# Patient Record
Sex: Female | Born: 1935 | Race: White | Hispanic: No | Marital: Married | Smoking: Never smoker
Health system: Southern US, Community
[De-identification: ages and names within clinical notes are randomized; demographics above are authoritative.]

## PROBLEM LIST (undated history)

## (undated) DIAGNOSIS — I5032 Chronic diastolic (congestive) heart failure: Secondary | ICD-10-CM

## (undated) DIAGNOSIS — M1990 Unspecified osteoarthritis, unspecified site: Secondary | ICD-10-CM

## (undated) DIAGNOSIS — I809 Phlebitis and thrombophlebitis of unspecified site: Secondary | ICD-10-CM

## (undated) DIAGNOSIS — I495 Sick sinus syndrome: Secondary | ICD-10-CM

## (undated) DIAGNOSIS — I10 Essential (primary) hypertension: Secondary | ICD-10-CM

## (undated) DIAGNOSIS — K5732 Diverticulitis of large intestine without perforation or abscess without bleeding: Secondary | ICD-10-CM

## (undated) DIAGNOSIS — I2510 Atherosclerotic heart disease of native coronary artery without angina pectoris: Secondary | ICD-10-CM

## (undated) DIAGNOSIS — G8929 Other chronic pain: Secondary | ICD-10-CM

## (undated) DIAGNOSIS — Z9289 Personal history of other medical treatment: Secondary | ICD-10-CM

## (undated) DIAGNOSIS — K819 Cholecystitis, unspecified: Secondary | ICD-10-CM

## (undated) DIAGNOSIS — I509 Heart failure, unspecified: Secondary | ICD-10-CM

## (undated) DIAGNOSIS — E039 Hypothyroidism, unspecified: Secondary | ICD-10-CM

## (undated) DIAGNOSIS — J90 Pleural effusion, not elsewhere classified: Secondary | ICD-10-CM

## (undated) DIAGNOSIS — R29898 Other symptoms and signs involving the musculoskeletal system: Secondary | ICD-10-CM

## (undated) DIAGNOSIS — R21 Rash and other nonspecific skin eruption: Secondary | ICD-10-CM

## (undated) DIAGNOSIS — E785 Hyperlipidemia, unspecified: Secondary | ICD-10-CM

## (undated) DIAGNOSIS — I4821 Permanent atrial fibrillation: Secondary | ICD-10-CM

## (undated) DIAGNOSIS — J301 Allergic rhinitis due to pollen: Secondary | ICD-10-CM

## (undated) DIAGNOSIS — I272 Pulmonary hypertension, unspecified: Secondary | ICD-10-CM

## (undated) DIAGNOSIS — K635 Polyp of colon: Secondary | ICD-10-CM

## (undated) DIAGNOSIS — J40 Bronchitis, not specified as acute or chronic: Secondary | ICD-10-CM

## (undated) DIAGNOSIS — R32 Unspecified urinary incontinence: Secondary | ICD-10-CM

## (undated) DIAGNOSIS — T7840XA Allergy, unspecified, initial encounter: Secondary | ICD-10-CM

## (undated) HISTORY — DX: Unspecified urinary incontinence: R32

## (undated) HISTORY — PX: SKIN GRAFT: SHX250

## (undated) HISTORY — DX: Atherosclerotic heart disease of native coronary artery without angina pectoris: I25.10

## (undated) HISTORY — DX: Phlebitis and thrombophlebitis of unspecified site: I80.9

## (undated) HISTORY — DX: Pleural effusion, not elsewhere classified: J90

## (undated) HISTORY — DX: Diverticulitis of large intestine without perforation or abscess without bleeding: K57.32

## (undated) HISTORY — DX: Permanent atrial fibrillation: I48.21

## (undated) HISTORY — DX: Hyperlipidemia, unspecified: E78.5

## (undated) HISTORY — DX: Personal history of other medical treatment: Z92.89

## (undated) HISTORY — DX: Other chronic pain: G89.29

## (undated) HISTORY — DX: Allergic rhinitis due to pollen: J30.1

## (undated) HISTORY — DX: Cholecystitis, unspecified: K81.9

## (undated) HISTORY — DX: Unspecified osteoarthritis, unspecified site: M19.90

## (undated) HISTORY — DX: Chronic diastolic (congestive) heart failure: I50.32

## (undated) HISTORY — DX: Other symptoms and signs involving the musculoskeletal system: R29.898

## (undated) HISTORY — DX: Pulmonary hypertension, unspecified: I27.20

## (undated) HISTORY — DX: Essential (primary) hypertension: I10

## (undated) HISTORY — DX: Rash and other nonspecific skin eruption: R21

## (undated) HISTORY — DX: Allergy, unspecified, initial encounter: T78.40XA

## (undated) HISTORY — PX: BREAST SURGERY: SHX581

## (undated) HISTORY — DX: Sick sinus syndrome: I49.5

## (undated) HISTORY — DX: Heart failure, unspecified: I50.9

## (undated) HISTORY — PX: TONSILLECTOMY: SUR1361

## (undated) HISTORY — DX: Hypothyroidism, unspecified: E03.9

## (undated) HISTORY — DX: Polyp of colon: K63.5

## (undated) HISTORY — DX: Bronchitis, not specified as acute or chronic: J40

---
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
## 1975-01-28 HISTORY — PX: ABDOMINAL HYSTERECTOMY: SHX81

## 1977-01-27 HISTORY — PX: ANKLE FRACTURE SURGERY: SHX122

## 1997-06-23 ENCOUNTER — Ambulatory Visit (HOSPITAL_COMMUNITY): Admission: RE | Admit: 1997-06-23 | Discharge: 1997-06-23 | Payer: Self-pay | Admitting: Endocrinology

## 1998-06-01 ENCOUNTER — Other Ambulatory Visit: Admission: RE | Admit: 1998-06-01 | Discharge: 1998-06-01 | Payer: Self-pay | Admitting: *Deleted

## 1998-06-23 ENCOUNTER — Emergency Department (HOSPITAL_COMMUNITY): Admission: EM | Admit: 1998-06-23 | Discharge: 1998-06-23 | Payer: Self-pay | Admitting: Emergency Medicine

## 1998-06-23 ENCOUNTER — Encounter: Payer: Self-pay | Admitting: Emergency Medicine

## 1998-07-06 ENCOUNTER — Ambulatory Visit (HOSPITAL_COMMUNITY): Admission: RE | Admit: 1998-07-06 | Discharge: 1998-07-06 | Payer: Self-pay | Admitting: Endocrinology

## 1998-07-06 ENCOUNTER — Encounter: Payer: Self-pay | Admitting: Endocrinology

## 1998-07-25 ENCOUNTER — Encounter: Payer: Self-pay | Admitting: Endocrinology

## 1998-07-25 ENCOUNTER — Ambulatory Visit (HOSPITAL_COMMUNITY): Admission: RE | Admit: 1998-07-25 | Discharge: 1998-07-25 | Payer: Self-pay | Admitting: Endocrinology

## 1999-05-15 ENCOUNTER — Encounter: Payer: Self-pay | Admitting: Endocrinology

## 1999-05-15 ENCOUNTER — Other Ambulatory Visit: Admission: RE | Admit: 1999-05-15 | Discharge: 1999-05-15 | Payer: Self-pay | Admitting: Endocrinology

## 1999-05-15 ENCOUNTER — Encounter: Admission: RE | Admit: 1999-05-15 | Discharge: 1999-05-15 | Payer: Self-pay | Admitting: Endocrinology

## 1999-05-15 ENCOUNTER — Encounter (INDEPENDENT_AMBULATORY_CARE_PROVIDER_SITE_OTHER): Payer: Self-pay | Admitting: *Deleted

## 1999-12-07 ENCOUNTER — Encounter: Payer: Self-pay | Admitting: Emergency Medicine

## 1999-12-07 ENCOUNTER — Emergency Department (HOSPITAL_COMMUNITY): Admission: EM | Admit: 1999-12-07 | Discharge: 1999-12-07 | Payer: Self-pay | Admitting: Emergency Medicine

## 1999-12-09 ENCOUNTER — Encounter (INDEPENDENT_AMBULATORY_CARE_PROVIDER_SITE_OTHER): Payer: Self-pay

## 1999-12-09 ENCOUNTER — Ambulatory Visit (HOSPITAL_COMMUNITY): Admission: RE | Admit: 1999-12-09 | Discharge: 1999-12-09 | Payer: Self-pay | Admitting: Gastroenterology

## 2000-02-04 ENCOUNTER — Other Ambulatory Visit: Admission: RE | Admit: 2000-02-04 | Discharge: 2000-02-04 | Payer: Self-pay | Admitting: *Deleted

## 2000-06-04 ENCOUNTER — Ambulatory Visit (HOSPITAL_COMMUNITY): Admission: RE | Admit: 2000-06-04 | Discharge: 2000-06-04 | Payer: Self-pay | Admitting: Endocrinology

## 2000-06-04 ENCOUNTER — Encounter: Payer: Self-pay | Admitting: Endocrinology

## 2000-10-22 ENCOUNTER — Encounter: Admission: RE | Admit: 2000-10-22 | Discharge: 2000-10-22 | Payer: Self-pay | Admitting: Endocrinology

## 2000-10-22 ENCOUNTER — Encounter: Payer: Self-pay | Admitting: Endocrinology

## 2001-06-18 ENCOUNTER — Ambulatory Visit (HOSPITAL_COMMUNITY): Admission: RE | Admit: 2001-06-18 | Discharge: 2001-06-18 | Payer: Self-pay | Admitting: Obstetrics & Gynecology

## 2002-05-24 ENCOUNTER — Encounter: Payer: Self-pay | Admitting: Endocrinology

## 2002-05-24 ENCOUNTER — Encounter: Admission: RE | Admit: 2002-05-24 | Discharge: 2002-05-24 | Payer: Self-pay | Admitting: Endocrinology

## 2002-06-21 ENCOUNTER — Encounter: Payer: Self-pay | Admitting: Endocrinology

## 2002-06-21 ENCOUNTER — Ambulatory Visit (HOSPITAL_COMMUNITY): Admission: RE | Admit: 2002-06-21 | Discharge: 2002-06-21 | Payer: Self-pay | Admitting: Endocrinology

## 2002-10-20 ENCOUNTER — Encounter: Admission: RE | Admit: 2002-10-20 | Discharge: 2002-10-20 | Payer: Self-pay | Admitting: Endocrinology

## 2002-10-20 ENCOUNTER — Encounter: Payer: Self-pay | Admitting: Endocrinology

## 2003-06-27 ENCOUNTER — Ambulatory Visit (HOSPITAL_COMMUNITY): Admission: RE | Admit: 2003-06-27 | Discharge: 2003-06-27 | Payer: Self-pay | Admitting: Endocrinology

## 2004-01-08 ENCOUNTER — Inpatient Hospital Stay (HOSPITAL_COMMUNITY): Admission: EM | Admit: 2004-01-08 | Discharge: 2004-01-12 | Payer: Self-pay | Admitting: Emergency Medicine

## 2004-01-09 ENCOUNTER — Encounter (INDEPENDENT_AMBULATORY_CARE_PROVIDER_SITE_OTHER): Payer: Self-pay | Admitting: *Deleted

## 2004-01-23 ENCOUNTER — Encounter: Admission: RE | Admit: 2004-01-23 | Discharge: 2004-01-23 | Payer: Self-pay | Admitting: Endocrinology

## 2004-03-11 ENCOUNTER — Encounter (HOSPITAL_COMMUNITY): Admission: RE | Admit: 2004-03-11 | Discharge: 2004-06-09 | Payer: Self-pay | Admitting: *Deleted

## 2004-07-01 ENCOUNTER — Ambulatory Visit (HOSPITAL_COMMUNITY): Admission: RE | Admit: 2004-07-01 | Discharge: 2004-07-01 | Payer: Self-pay | Admitting: Endocrinology

## 2005-01-27 ENCOUNTER — Emergency Department (HOSPITAL_COMMUNITY): Admission: EM | Admit: 2005-01-27 | Discharge: 2005-01-27 | Payer: Self-pay | Admitting: Emergency Medicine

## 2005-02-06 ENCOUNTER — Emergency Department (HOSPITAL_COMMUNITY): Admission: EM | Admit: 2005-02-06 | Discharge: 2005-02-06 | Payer: Self-pay | Admitting: Family Medicine

## 2005-02-10 ENCOUNTER — Emergency Department (HOSPITAL_COMMUNITY): Admission: EM | Admit: 2005-02-10 | Discharge: 2005-02-10 | Payer: Self-pay | Admitting: Family Medicine

## 2005-02-14 ENCOUNTER — Emergency Department (HOSPITAL_COMMUNITY): Admission: EM | Admit: 2005-02-14 | Discharge: 2005-02-14 | Payer: Self-pay | Admitting: Family Medicine

## 2005-02-19 ENCOUNTER — Emergency Department (HOSPITAL_COMMUNITY): Admission: EM | Admit: 2005-02-19 | Discharge: 2005-02-19 | Payer: Self-pay | Admitting: Family Medicine

## 2005-03-19 ENCOUNTER — Encounter: Admission: RE | Admit: 2005-03-19 | Discharge: 2005-03-19 | Payer: Self-pay | Admitting: Endocrinology

## 2005-07-09 ENCOUNTER — Ambulatory Visit (HOSPITAL_COMMUNITY): Admission: RE | Admit: 2005-07-09 | Discharge: 2005-07-09 | Payer: Self-pay | Admitting: Endocrinology

## 2005-07-23 ENCOUNTER — Encounter: Admission: RE | Admit: 2005-07-23 | Discharge: 2005-07-23 | Payer: Self-pay | Admitting: Endocrinology

## 2006-01-01 ENCOUNTER — Encounter: Admission: RE | Admit: 2006-01-01 | Discharge: 2006-01-01 | Payer: Self-pay | Admitting: Endocrinology

## 2006-03-12 ENCOUNTER — Encounter: Admission: RE | Admit: 2006-03-12 | Discharge: 2006-03-12 | Payer: Self-pay | Admitting: Endocrinology

## 2006-04-08 ENCOUNTER — Encounter: Admission: RE | Admit: 2006-04-08 | Discharge: 2006-04-08 | Payer: Self-pay | Admitting: Gastroenterology

## 2006-06-19 ENCOUNTER — Encounter: Admission: RE | Admit: 2006-06-19 | Discharge: 2006-06-19 | Payer: Self-pay | Admitting: Endocrinology

## 2006-07-14 ENCOUNTER — Encounter: Admission: RE | Admit: 2006-07-14 | Discharge: 2006-07-14 | Payer: Self-pay | Admitting: Endocrinology

## 2007-01-05 ENCOUNTER — Encounter: Admission: RE | Admit: 2007-01-05 | Discharge: 2007-01-05 | Payer: Self-pay | Admitting: Endocrinology

## 2007-07-01 ENCOUNTER — Emergency Department (HOSPITAL_COMMUNITY): Admission: EM | Admit: 2007-07-01 | Discharge: 2007-07-01 | Payer: Self-pay | Admitting: Family Medicine

## 2007-07-16 ENCOUNTER — Encounter: Admission: RE | Admit: 2007-07-16 | Discharge: 2007-07-16 | Payer: Self-pay | Admitting: Endocrinology

## 2007-07-19 ENCOUNTER — Ambulatory Visit (HOSPITAL_COMMUNITY): Admission: RE | Admit: 2007-07-19 | Discharge: 2007-07-19 | Payer: Self-pay | Admitting: *Deleted

## 2007-07-19 ENCOUNTER — Encounter (INDEPENDENT_AMBULATORY_CARE_PROVIDER_SITE_OTHER): Payer: Self-pay | Admitting: *Deleted

## 2008-01-28 HISTORY — PX: BREAST FIBROADENOMA SURGERY: SHX580

## 2008-05-28 ENCOUNTER — Emergency Department (HOSPITAL_COMMUNITY): Admission: EM | Admit: 2008-05-28 | Discharge: 2008-05-28 | Payer: Self-pay | Admitting: Emergency Medicine

## 2008-06-07 ENCOUNTER — Emergency Department (HOSPITAL_COMMUNITY): Admission: EM | Admit: 2008-06-07 | Discharge: 2008-06-07 | Payer: Self-pay | Admitting: Family Medicine

## 2008-06-20 ENCOUNTER — Ambulatory Visit (HOSPITAL_COMMUNITY): Admission: RE | Admit: 2008-06-20 | Discharge: 2008-06-21 | Payer: Self-pay | Admitting: *Deleted

## 2008-06-20 HISTORY — PX: PACEMAKER INSERTION: SHX728

## 2008-06-27 ENCOUNTER — Ambulatory Visit: Payer: Self-pay | Admitting: *Deleted

## 2008-08-18 ENCOUNTER — Encounter: Admission: RE | Admit: 2008-08-18 | Discharge: 2008-08-18 | Payer: Self-pay | Admitting: Endocrinology

## 2008-08-23 ENCOUNTER — Encounter: Admission: RE | Admit: 2008-08-23 | Discharge: 2008-08-23 | Payer: Self-pay | Admitting: Endocrinology

## 2008-09-12 ENCOUNTER — Emergency Department (HOSPITAL_COMMUNITY): Admission: EM | Admit: 2008-09-12 | Discharge: 2008-09-12 | Payer: Self-pay | Admitting: Family Medicine

## 2008-10-31 ENCOUNTER — Ambulatory Visit (HOSPITAL_COMMUNITY): Admission: RE | Admit: 2008-10-31 | Discharge: 2008-10-31 | Payer: Self-pay | Admitting: Surgery

## 2008-10-31 ENCOUNTER — Encounter (INDEPENDENT_AMBULATORY_CARE_PROVIDER_SITE_OTHER): Payer: Self-pay | Admitting: Surgery

## 2008-10-31 ENCOUNTER — Encounter: Admission: RE | Admit: 2008-10-31 | Discharge: 2008-10-31 | Payer: Self-pay | Admitting: Surgery

## 2008-11-22 ENCOUNTER — Encounter: Admission: RE | Admit: 2008-11-22 | Discharge: 2008-11-22 | Payer: Self-pay | Admitting: Endocrinology

## 2008-11-30 ENCOUNTER — Inpatient Hospital Stay (HOSPITAL_COMMUNITY): Admission: EM | Admit: 2008-11-30 | Discharge: 2008-12-05 | Payer: Self-pay | Admitting: Emergency Medicine

## 2008-12-08 ENCOUNTER — Encounter: Admission: RE | Admit: 2008-12-08 | Discharge: 2008-12-08 | Payer: Self-pay | Admitting: Endocrinology

## 2009-01-27 HISTORY — PX: BREAST BIOPSY: SHX20

## 2009-02-02 ENCOUNTER — Encounter: Admission: RE | Admit: 2009-02-02 | Discharge: 2009-02-02 | Payer: Self-pay | Admitting: Endocrinology

## 2009-02-02 ENCOUNTER — Emergency Department (HOSPITAL_COMMUNITY): Admission: EM | Admit: 2009-02-02 | Discharge: 2009-02-03 | Payer: Self-pay | Admitting: Emergency Medicine

## 2009-05-16 ENCOUNTER — Ambulatory Visit (HOSPITAL_COMMUNITY): Admission: RE | Admit: 2009-05-16 | Discharge: 2009-05-16 | Payer: Self-pay | Admitting: Endocrinology

## 2009-05-17 ENCOUNTER — Ambulatory Visit (HOSPITAL_COMMUNITY): Admission: RE | Admit: 2009-05-17 | Discharge: 2009-05-17 | Payer: Self-pay | Admitting: Endocrinology

## 2009-05-30 ENCOUNTER — Encounter: Admission: RE | Admit: 2009-05-30 | Discharge: 2009-05-30 | Payer: Self-pay | Admitting: Endocrinology

## 2009-07-27 DEATH — deceased

## 2009-08-20 ENCOUNTER — Encounter: Admission: RE | Admit: 2009-08-20 | Discharge: 2009-08-20 | Payer: Self-pay | Admitting: Endocrinology

## 2009-09-04 ENCOUNTER — Ambulatory Visit: Payer: Self-pay | Admitting: Cardiology

## 2009-09-19 ENCOUNTER — Ambulatory Visit: Payer: Self-pay | Admitting: Cardiology

## 2009-10-16 ENCOUNTER — Ambulatory Visit: Payer: Self-pay | Admitting: Cardiology

## 2009-11-13 ENCOUNTER — Ambulatory Visit: Payer: Self-pay | Admitting: Cardiology

## 2009-12-06 ENCOUNTER — Ambulatory Visit: Payer: Self-pay | Admitting: Cardiology

## 2009-12-20 ENCOUNTER — Encounter: Payer: Self-pay | Admitting: Internal Medicine

## 2010-01-03 ENCOUNTER — Ambulatory Visit: Payer: Self-pay | Admitting: Cardiology

## 2010-02-01 ENCOUNTER — Ambulatory Visit: Payer: Self-pay | Admitting: Cardiology

## 2010-02-18 ENCOUNTER — Ambulatory Visit: Payer: Self-pay | Admitting: Cardiology

## 2010-02-18 ENCOUNTER — Encounter: Payer: Self-pay | Admitting: Endocrinology

## 2010-02-19 LAB — HM COLONOSCOPY

## 2010-02-26 NOTE — Miscellaneous (Signed)
Summary: Device preload  Clinical Lists Changes  Observations: Added new observation of PPM INDICATN: Sick sinus syndrome (12/20/2009 11:28) Added new observation of MAGNET RTE: BOL 85 ERI 65 (12/20/2009 11:28) Added new observation of PPMLEADSTAT2: active (12/20/2009 11:28) Added new observation of PPMLEADSER2: [id number] (12/20/2009 11:28) Added new observation of PPMLEADMOD2: 5076  (12/20/2009 11:28) Added new observation of PPMLEADDOI2: 06/20/2008  (12/20/2009 11:28) Added new observation of PPMLEADLOC2: RV  (12/20/2009 11:28) Added new observation of PPMLEADSTAT1: active  (12/20/2009 11:28) Added new observation of PPMLEADSER1: [id number]  (12/20/2009 11:28) Added new observation of PPMLEADMOD1: 5076  (12/20/2009 11:28) Added new observation of PPMLEADDOI1: 06/20/2008  (12/20/2009 11:28) Added new observation of PPMLEADLOC1: RA  (12/20/2009 11:28) Added new observation of PPM DOI: 06/20/2008  (12/20/2009 11:28) Added new observation of PPM SERL#: [id number]  (12/20/2009 11:28) Added new observation of PPM MODL#: [id number]  (12/20/2009 11:28) Added new observation of PACEMAKERMFG: Medtronic  (12/20/2009 11:28) Added new observation of PPM IMP MD: Charlynn Court  (12/20/2009 11:28) Added new observation of PPM REFER MD: Peter Swaziland, MD  (12/20/2009 11:28) Added new observation of PACEMAKER MD: Hillis Range, MD  (12/20/2009 11:28)      PPM Specifications Following MD:  Hillis Range, MD     Referring MD:  Peter Swaziland, MD PPM Vendor:  Medtronic     PPM Model Number:  [id number]     PPM Serial Number:  [id number] PPM DOI:  06/20/2008     PPM Implanting MD:  Charlynn Court  Lead 1    Location: RA     DOI: 06/20/2008     Model #: [id number]     Serial #: [id number]     Status: active Lead 2    Location: RV     DOI: 06/20/2008     Model #: 7253     Serial #: [id number]     Status: active  Magnet Response Rate:  BOL 85 ERI 65  Indications:  Sick sinus syndrome

## 2010-03-01 ENCOUNTER — Other Ambulatory Visit (INDEPENDENT_AMBULATORY_CARE_PROVIDER_SITE_OTHER): Payer: Medicare Other

## 2010-03-01 ENCOUNTER — Encounter (INDEPENDENT_AMBULATORY_CARE_PROVIDER_SITE_OTHER): Payer: Medicare Other | Admitting: Internal Medicine

## 2010-03-01 ENCOUNTER — Ambulatory Visit: Admit: 2010-03-01 | Payer: Self-pay | Admitting: Internal Medicine

## 2010-03-01 ENCOUNTER — Encounter: Payer: Self-pay | Admitting: Internal Medicine

## 2010-03-01 DIAGNOSIS — I495 Sick sinus syndrome: Secondary | ICD-10-CM

## 2010-03-01 DIAGNOSIS — I4891 Unspecified atrial fibrillation: Secondary | ICD-10-CM

## 2010-03-01 DIAGNOSIS — I10 Essential (primary) hypertension: Secondary | ICD-10-CM | POA: Insufficient documentation

## 2010-03-01 DIAGNOSIS — E785 Hyperlipidemia, unspecified: Secondary | ICD-10-CM

## 2010-03-05 ENCOUNTER — Telehealth: Payer: Self-pay | Admitting: Internal Medicine

## 2010-03-06 NOTE — Assessment & Plan Note (Signed)
Summary: pacer check/medtronic/gso card pt/jml   Vital Signs:  Patient profile:   77 year old female Height:      64 inches Weight:      112.50 pounds BMI:     19.38 Pulse rate:   80 / minute Pulse rhythm:   regular BP sitting:   151 / 77  (left arm) Cuff size:   regular  Vitals Entered By: Danielle Rankin, CMA (March 01, 2010 10:35 AM)  Visit Type:  Pacemaker check Referring Provider:  Dr Swaziland Primary Provider:  Remi Deter  CC:   .  History of Present Illness: Mr Hayman is a pleasant 77 yo WF with a h/o permanent atrial fibrillation and tachy/brady syndrome s/p PPM (MDT) by Dr Reyes Ivan who presents today to establish care in the EP clinic.  She reports chronic but stable dyspnea.  She denies CP, palpitations, presyncope, or syncope.  She remains active and is otherwise without complaint today.  Current Medications (verified): 1)  Diltiazem Hcl 120 Mg Tabs (Diltiazem Hcl) .[name].. 1 Tab Once Daily 2)  Atenolol 50 Mg Tabs (Atenolol) .... 1/2 Tab Once Daily 3)  Synthroid 125 Mcg Tabs (Levothyroxine Sodium) .[name].. 1 Tab Once Daily 4)  Lipitor 40 Mg Tabs (Atorvastatin Calcium) .... 1/2 Tab At Bedtime 5)  Coumadin 5 Mg Tabs (Warfarin Sodium) .[name].. 1 Tab Once Daily....except 7 Mg On Weds As Per Dr. Swaziland Office 6)  Potassium Chloride Cr 10 Meq Cr-Tabs (Potassium Chloride) .[name].. 1 Tab Once Daily 7)  Aspirin 81 Mg Tbec (Aspirin) .... Take One Tablet By Mouth Daily 8)  Travatan Z 0.004 % Soln (Travoprost) .... Use As Directed 9)  Cosopt 22.3-6.8 Mg/ml Soln (Dorzolamide Hcl-Timolol Mal) .... Use As Directed  Allergies (verified): 1)  ! * Actonel 2)  ! * Digoxin 3)  ! Hydrocodone  Past History:  Past Medical History:  1. Permanent Atrial fibrillation.   2. Hypertension.   3. Diastolic dysfunction.   4. History of coronary artery disease with occlusion of the distal circumflex branch in 2005.   5. Dyslipidemia.   6. Gastroesophageal reflux.   7. Sick sinus syndrome with DDD pacemaker.     8. Chronic pain, presumably due to arthritis.   9. Glaucoma.   10.Chronic anticoagulation.   Past Surgical History:   Needle-guided excision, right breast calcifications.   appendectomy,  hysterectomy,  tonsillectomy  NOTE  - R kidney   surgery, pin L ankle for fx., fibroid removed from R breast Pacemaker implant by Dr Reyes Ivan (MDT)06/20/08     Family History: + DM  Social History: Reviewed history from 02/28/2010 and no changes required.  The patient is married for greater than 60 years.  She  does not drink.  She denies smoking.      Review of Systems       All systems are reviewed and negative except as listed in the HPI.   Physical Exam  General:  thin elderly female, NAD Head:  normocephalic and atraumatic Eyes:  PERRLA/EOM intact; conjunctiva and lids normal. Mouth:  Teeth, gums and palate normal. Oral mucosa normal. Neck:  supple Lungs:  Clear bilaterally to auscultation and percussion. Heart:  iRRR, no m/r/g Abdomen:  Bowel sounds positive; abdomen soft and non-tender without masses, organomegaly, or hernias noted. No hepatosplenomegaly. Msk:  diffuse muscle atrophy Extremities:  No clubbing or cyanosis. Neurologic:  Alert and oriented x 3. Skin:  Intact without lesions or rashes.   Impression & Recommendations:  Problem # 1:  ATRIAL  FIBRILLATION, CHRONIC (ICD-427.31) permanent afib, V rates conrolled appropriately anticoagulated with coumadin  Problem # 2:  BRADYCARDIA-TACHYCARDIA SYNDROME (ICD-427.81) normal pacemaker function no changes today  Problem # 3:  ESSENTIAL HYPERTENSION, BENIGN (ICD-401.1) above goal today, though she reports good control previously salt restriction  Patient Instructions: 1)  Your physician wants you to follow-up in:  6 months in the device clinic and 12 months with Dr Jacquiline Doe will receive a reminder letter in the mail two months in advance. If you don't receive a letter, please call our office to schedule the follow-up  appointment. 2)  Your physician recommends that you continue on your current medications as directed. Please refer to the Current Medication list given to you today.      PPM Specifications Following MD:  Hillis Range, MD     Referring MD:  Peter Swaziland, MD PPM Vendor:  Medtronic     PPM Model Number:  [id number]     PPM Serial Number:  [id number] H PPM DOI:  06/20/2008     PPM Implanting MD:  Charlynn Court  Lead 1    Location: RA     DOI: 06/20/2008     Model #: 5076     Serial #: [id number]     Status: active Lead 2    Location: RV     DOI: 06/20/2008     Model #: 0160     Serial #: [id number]     Status: active  Magnet Response Rate:  BOL 85 ERI 65  Indications:  Sick sinus syndrome   PPM Follow Up Battery Voltage:  2.99 V       PPM Device Measurements Atrium  Amplitude: 1.7 mV, Impedance: 536 ohms,  Right Ventricle  Amplitude: 7.6 mV, Impedance: 528 ohms, Threshold: 0.5 V at 0.4 msec  Episodes MS Episodes:  1     Percent Mode Switch:  100%     Ventricular High Rate:  0     Atrial Pacing:  0.3%     Ventricular Pacing:  44.3%  Parameters Mode:  MVP     Lower Rate Limit:  70     Upper Rate Limit:  130 Paced AV Delay:  180     Sensed AV Delay:  150 Next Cardiology Appt Due:  08/28/2010 Tech Comments:  PT IN AF 100% OF TIME.  + COUMADIN.  NORMAL DEVICE FUNCTION.  CHANGED RV OUTPUT FROM 2.0 TO 2.5 V.  PT PREFERS OV RATHER THAN CARELINK. Vella Kohler  March 01, 2010 10:58 AM MD Comments:  agree

## 2010-03-14 NOTE — Cardiovascular Report (Signed)
Summary: Office Visit   Office Visit   Imported By: Roderic Ovens 03/06/2010 12:31:36  _____________________________________________________________________  External Attachment:    Type:   Image     Comment:   External Document

## 2010-03-20 NOTE — Progress Notes (Signed)
Summary: NEED REFERRAL TO PCP  Phone Note Call from Patient Call back at Home Phone [phone]   Caller: Patient Summary of Call: PT CALLING TO GET REFERRAL TO A PCP Initial call taken by: Judie Grieve,  March 05, 2010 10:22 AM  Follow-up for Phone Call        Please refer to [facility]. Follow-up by: Hillis Range, MD,  March 11, 2010 12:09 PM

## 2010-04-02 ENCOUNTER — Encounter (INDEPENDENT_AMBULATORY_CARE_PROVIDER_SITE_OTHER): Payer: Medicare Other

## 2010-04-02 DIAGNOSIS — I4891 Unspecified atrial fibrillation: Secondary | ICD-10-CM

## 2010-04-02 DIAGNOSIS — Z7901 Long term (current) use of anticoagulants: Secondary | ICD-10-CM

## 2010-04-10 ENCOUNTER — Encounter (INDEPENDENT_AMBULATORY_CARE_PROVIDER_SITE_OTHER): Payer: Medicare Other

## 2010-04-10 DIAGNOSIS — I4891 Unspecified atrial fibrillation: Secondary | ICD-10-CM

## 2010-04-10 DIAGNOSIS — Z7901 Long term (current) use of anticoagulants: Secondary | ICD-10-CM

## 2010-04-14 LAB — URINALYSIS, ROUTINE W REFLEX MICROSCOPIC
Glucose, UA: NEGATIVE mg/dL
Hgb urine dipstick: NEGATIVE
Ketones, ur: 15 mg/dL — AB
Nitrite: NEGATIVE
Protein, ur: NEGATIVE mg/dL
Specific Gravity, Urine: 1.019 (ref 1.005–1.030)
pH: 6.5 (ref 5.0–8.0)

## 2010-04-14 LAB — HEPATIC FUNCTION PANEL
ALT: 13 U/L (ref 0–35)
AST: 21 U/L (ref 0–37)
Bilirubin, Direct: 0.2 mg/dL (ref 0.0–0.3)
Indirect Bilirubin: 0.5 mg/dL (ref 0.3–0.9)
Total Bilirubin: 0.7 mg/dL (ref 0.3–1.2)

## 2010-04-14 LAB — DIFFERENTIAL
Eosinophils Absolute: 0.2 10*3/uL (ref 0.0–0.7)
Eosinophils Relative: 3 % (ref 0–5)
Lymphocytes Relative: 12 % (ref 12–46)
Lymphs Abs: 1 10*3/uL (ref 0.7–4.0)
Monocytes Absolute: 0.8 10*3/uL (ref 0.1–1.0)

## 2010-04-14 LAB — BASIC METABOLIC PANEL
BUN: 24 mg/dL — ABNORMAL HIGH (ref 6–23)
Chloride: 97 mEq/L (ref 96–112)
GFR calc non Af Amer: 60 mL/min — ABNORMAL LOW (ref >60)
Glucose, Bld: 110 mg/dL — ABNORMAL HIGH (ref 70–99)
Potassium: 3.7 mEq/L (ref 3.5–5.1)
Sodium: 138 mEq/L (ref 135–145)

## 2010-04-14 LAB — CBC
HCT: 36.8 % (ref 36.0–46.0)
Hemoglobin: 12.5 g/dL (ref 12.0–15.0)
MCV: 88 fL (ref 78.0–100.0)
Platelets: 262 10*3/uL (ref 150–400)
RDW: 15.3 % (ref 11.5–15.5)
WBC: 8 10*3/uL (ref 4.0–10.5)

## 2010-04-14 LAB — URINE MICROSCOPIC-ADD ON

## 2010-04-14 LAB — DIGOXIN LEVEL: Digoxin Level: 1.7 ng/mL (ref 0.8–2.0)

## 2010-04-16 LAB — BLOOD GAS, ARTERIAL
Acid-base deficit: 0.7 mmol/L (ref 0.0–2.0)
Bicarbonate: 21.9 mEq/L (ref 20.0–24.0)
O2 Saturation: 98.1 %
Patient temperature: 98.6
TCO2: 22.7 mmol/L (ref 0–100)
pO2, Arterial: 96 mmHg (ref 80.0–100.0)

## 2010-04-23 ENCOUNTER — Ambulatory Visit (INDEPENDENT_AMBULATORY_CARE_PROVIDER_SITE_OTHER): Payer: Medicare Other | Admitting: Internal Medicine

## 2010-04-23 ENCOUNTER — Encounter: Payer: Self-pay | Admitting: Internal Medicine

## 2010-04-23 VITALS — BP 122/80 | HR 91 | Temp 97.5°F | Ht 62.0 in | Wt 112.0 lb

## 2010-04-23 DIAGNOSIS — Z1231 Encounter for screening mammogram for malignant neoplasm of breast: Secondary | ICD-10-CM

## 2010-04-23 DIAGNOSIS — E039 Hypothyroidism, unspecified: Secondary | ICD-10-CM

## 2010-04-23 NOTE — Patient Instructions (Signed)
Continue all your present medications. May have a minor hernia left lower abdomen - not tender. We can watch this for now. I will review Dr. Jerelene Redden notes when available.

## 2010-04-23 NOTE — Progress Notes (Signed)
Subjective:    Patient ID: Laurie Larsen, female    DOB: 02-Feb-1923, 77 y.o.   MRN: [id number]  HPI Mrs. Fromer presents to establish for on-going continuity care. She was formerly a patient of the now retired Dr. Dagoberto Ligas.   She does c/o a slight bulge in the left groin. This has enlarged since December. There is no tenderness and the area is reducible.   She fell about a month ago - bruised her face and hurt her knees. She did have x-rays. She is concerned about any damage to her PTVDP.  She has been reported to have a "spot" on the right lung. Her last follow-up x-ray was in 11-Jun-2022. Reviewed images in Pacs system - very small 4mm nodule periphery lower lobe right - without much change from '08.  Safety review: has smoke alarms, has firearms in the home, wears seat belts.  Past Medical History  Diagnosis Date  . Permanent atrial fibrillation   . HTN (hypertension)   . Diastolic dysfunction   . CAD (coronary artery disease)     W/occlusion of distal circumflex branch 12-Jun-2003  . Dyslipidemia   . GERD (gastroesophageal reflux disease)   . Sick sinus syndrome     W/DDD pacemaker  . Chronic pain     ? due to arthritis  . Glaucoma   . Chronic anticoagulation   . Hyperlipidemia   . Diverticulitis large intestine      by history  . Allergy   . Phlebitis     after pacemeker placement  . Colon polyps   . Thyroid disease     hypothyroidism on medications  . Transfusion history     after childbirth 62 years ago 1953-06-12)  . CHF (congestive heart failure)     hospitalized 06/11/08  . Rash/skin eruption     multiple episodes - wide distribution-intermittent, possibly drug rash.   Past Surgical History  Procedure Date  . Appendectomy   . Abdominal hysterectomy   . Tonsillectomy   . Kidney surgery     Right  . Ankle fracture surgery     Left, pin  . Pacemaker insertion     Dr Reyes Ivan (MDT) 5.25.2010  . Breast surgery     Needle guided excision, right breast calcification  . Breast  fibroadenoma surgery     Right   Family History  Problem Relation Age of Onset  . Cancer Mother     colon with mets  . Diabetes Mother   . Heart disease Father   . Hyperlipidemia Father   . Hypertension Father   . Stroke Brother     brother who died Jun 11, 2022  . Cancer Paternal Aunt     breast  . Cancer Cousin     uterine, lung cancer   History   Social History  . Marital Status: Married    Spouse Name: N/A    Number of Children: N/A  . Years of Education: 13   Occupational History  . RETIRED    Social History Main Topics  . Smoking status: Never Smoker   . Smokeless tobacco: Never Used  . Alcohol Use: No  . Drug Use: Not on file  . Sexually Active: No   Other Topics Concern  . 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  Married 1948. 3 sons - 2050-06-12, Jun 12, 2055, Jun 11, 2056; 5 grandchildren-one deceased -  OD @  21.  Lives alone with husband and they are independent in ADLs.  End of Life Care: no prolonged heroic measures, i.e. Artificial feeding or hydration; DNR; no prolonged intubation.      Review of Systems  Constitutional: Negative.  Negative for fever, chills and unexpected weight change.  HENT: Negative.  Negative for ear pain, congestion, sneezing, neck pain and tinnitus.   Eyes: Negative.  Negative for pain, discharge and redness.       [Possible macular degeneration per Dr. Eulah Pont - not a definitive diagnosis Respiratory: Negative for cough, chest tightness and wheezing.   Cardiovascular: Negative.  Negative for chest pain and palpitations.  Gastrointestinal: Negative.  Negative for abdominal distention.       [No distention, reflux, dyspepsia. Decrease appetite with early satiety. Genitourinary: Positive for urgency and frequency.       [Mild problem with loss of bladder control. Occasional incontinence due to urgency related to lasix.  Musculoskeletal: Positive for back  pain.       [Chronic hip pain and leg pain.  Neurological: Negative.  Negative for dizziness, tremors, weakness and headaches.  Hematological: Negative.   Psychiatric/Behavioral: Negative.        Objective:   Physical Exam  [vitalsreviewed. Constitutional: She is oriented to person, place, and time. She appears well-developed. No distress.       Elderly thin white woman in no distress.  HENT:  Head: Normocephalic and atraumatic.  Right Ear: External ear normal.  Left Ear: External ear normal.  Nose: Nose normal.  Mouth/Throat: Oropharynx is clear and moist.  Eyes: Conjunctivae and EOM are normal. Pupils are equal, round, and reactive to light.       Post cataract appearing pupils  Neck: Normal range of motion. Neck supple. No JVD present. No tracheal deviation present. No thyromegaly present.  Cardiovascular:       IRIR controlled rate. No distinct murmur. 2+ radial and DP pulses. Quiet precordium  Pulmonary/Chest: Effort normal and breath sounds normal. She has no wheezes. She exhibits no tenderness.       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  Pacemaker left anterior lateral chest wall - mildly tender  Abdominal: Soft. Bowel sounds are normal. She exhibits no distension. There is no rebound and no guarding.  Musculoskeletal: Normal range of motion.       Interosseous wasting both hands. No frank deformities.  Neurological: She is alert and oriented to person, place, and time. She has normal reflexes. No cranial nerve deficit. Coordination normal.  Skin: Skin is warm and dry.       Many bruises: hands, forearms, right neck  Psychiatric: She has a normal mood and affect. Her behavior is normal. Thought content normal.          Assessment & Plan:  1. Hypertension - very well controlled at today's visit on her present medications.  Plan - continue present medications           Will review labs from Dr. Jerelene Redden office that were done in December  2. A. Fib - stable rate  today. She does follow closely with Dr. Swaziland  3. Hypothyroidism - She reports that she has been stable on her present dose of levothyroxine  Plan - review lab from Dr. Jerelene Redden office when available with appropriate change in meds.  4. Sick Sinus syndrome with PTVDP - patient is stable and followed by Dr. Johney Frame. Her pacer site appears normal after her fall.  Plan - follow-up with Dr. Johney Frame as  instructed  5. Health maintenance - limited exam today is normal. Will review labs. She may be due for mammography and will schedule her for this at [facility]  In summary - a nice woman who appears to be medically stable. She will return in several weeks for a consolidation visit.

## 2010-04-24 ENCOUNTER — Ambulatory Visit (INDEPENDENT_AMBULATORY_CARE_PROVIDER_SITE_OTHER): Payer: Medicare Other | Admitting: *Deleted

## 2010-04-24 DIAGNOSIS — Z7901 Long term (current) use of anticoagulants: Secondary | ICD-10-CM

## 2010-04-24 DIAGNOSIS — E039 Hypothyroidism, unspecified: Secondary | ICD-10-CM | POA: Insufficient documentation

## 2010-04-24 DIAGNOSIS — I4891 Unspecified atrial fibrillation: Secondary | ICD-10-CM

## 2010-04-26 ENCOUNTER — Telehealth: Payer: Self-pay | Admitting: Internal Medicine

## 2010-04-26 NOTE — Telephone Encounter (Signed)
Forwarded to Dr. Norins for review. °

## 2010-05-01 LAB — POCT I-STAT 3, ART BLOOD GAS (G3+)
Acid-base deficit: 1 mmol/L (ref 0.0–2.0)
Bicarbonate: 22.3 mEq/L (ref 20.0–24.0)
pCO2 arterial: 32.2 mmHg — ABNORMAL LOW (ref 35.0–45.0)
pH, Arterial: 7.448 — ABNORMAL HIGH (ref 7.350–7.400)
pO2, Arterial: 110 mmHg — ABNORMAL HIGH (ref 80.0–100.0)

## 2010-05-01 LAB — CBC
HCT: 33.6 % — ABNORMAL LOW (ref 36.0–46.0)
HCT: 34.4 % — ABNORMAL LOW (ref 36.0–46.0)
HCT: 36.9 % (ref 36.0–46.0)
HCT: 37.7 % (ref 36.0–46.0)
Hemoglobin: 12 g/dL (ref 12.0–15.0)
MCHC: 34.2 g/dL (ref 30.0–36.0)
MCHC: 34.9 g/dL (ref 30.0–36.0)
MCHC: 35 g/dL (ref 30.0–36.0)
MCV: 88.7 fL (ref 78.0–100.0)
MCV: 89.1 fL (ref 78.0–100.0)
MCV: 89.6 fL (ref 78.0–100.0)
MCV: 89.8 fL (ref 78.0–100.0)
MCV: 90.6 fL (ref 78.0–100.0)
Platelets: 181 10*3/uL (ref 150–400)
Platelets: 184 10*3/uL (ref 150–400)
Platelets: 195 10*3/uL (ref 150–400)
RBC: 4.1 MIL/uL (ref 3.87–5.11)
RBC: 4.2 MIL/uL (ref 3.87–5.11)
RBC: 4.83 MIL/uL (ref 3.87–5.11)
RDW: 14.7 % (ref 11.5–15.5)
RDW: 14.8 % (ref 11.5–15.5)
WBC: 7 10*3/uL (ref 4.0–10.5)
WBC: 7 10*3/uL (ref 4.0–10.5)
WBC: 7.6 10*3/uL (ref 4.0–10.5)

## 2010-05-01 LAB — CK TOTAL AND CKMB (NOT AT ARMC)
CK, MB: 1.9 ng/mL (ref 0.3–4.0)
Relative Index: INVALID (ref 0.0–2.5)
Relative Index: INVALID (ref 0.0–2.5)
Total CK: 55 U/L (ref 7–177)

## 2010-05-01 LAB — POCT CARDIAC MARKERS
CKMB, poc: 2.7 ng/mL (ref 1.0–8.0)
Myoglobin, poc: 7.2 ng/mL — ABNORMAL LOW (ref 12–200)
Myoglobin, poc: 9.6 ng/mL — ABNORMAL LOW (ref 12–200)
Troponin i, poc: <0.05 ng/mL (ref 0.00–0.09)
Troponin i, poc: <0.05 ng/mL (ref 0.00–0.09)

## 2010-05-01 LAB — DIFFERENTIAL
Basophils Absolute: 0 10*3/uL (ref 0.0–0.1)
Basophils Relative: 0 % (ref 0–1)
Lymphocytes Relative: 10 % — ABNORMAL LOW (ref 12–46)
Monocytes Relative: 7 % (ref 3–12)
Neutro Abs: 7.8 10*3/uL — ABNORMAL HIGH (ref 1.7–7.7)
Neutrophils Relative %: 80 % — ABNORMAL HIGH (ref 43–77)

## 2010-05-01 LAB — TROPONIN T

## 2010-05-01 LAB — COMPREHENSIVE METABOLIC PANEL
Albumin: 3.3 g/dL — ABNORMAL LOW (ref 3.5–5.2)
BUN: 11 mg/dL (ref 6–23)
BUN: 14 mg/dL (ref 6–23)
BUN: 24 mg/dL — ABNORMAL HIGH (ref 6–23)
CO2: 27 mEq/L (ref 19–32)
Calcium: 8.1 mg/dL — ABNORMAL LOW (ref 8.4–10.5)
Calcium: 8.8 mg/dL (ref 8.4–10.5)
Chloride: 102 mEq/L (ref 96–112)
Creatinine, Ser: 0.95 mg/dL (ref 0.4–1.2)
GFR calc non Af Amer: 56 mL/min — ABNORMAL LOW (ref >60)
Glucose, Bld: 110 mg/dL — ABNORMAL HIGH (ref 70–99)
Glucose, Bld: 90 mg/dL (ref 70–99)
Glucose, Bld: 97 mg/dL (ref 70–99)
Sodium: 135 mEq/L (ref 135–145)
Sodium: 138 mEq/L (ref 135–145)
Total Bilirubin: 0.6 mg/dL (ref 0.3–1.2)
Total Protein: 6 g/dL (ref 6.0–8.3)
Total Protein: 7 g/dL (ref 6.0–8.3)

## 2010-05-01 LAB — URINE CULTURE: Colony Count: 4000

## 2010-05-01 LAB — BRAIN NATRIURETIC PEPTIDE: Pro B Natriuretic peptide (BNP): 773 pg/mL — ABNORMAL HIGH (ref 0.0–100.0)

## 2010-05-01 LAB — HEMOCCULT GUIAC POC 1CARD (OFFICE): Fecal Occult Bld: NEGATIVE

## 2010-05-01 LAB — BASIC METABOLIC PANEL
BUN: 14 mg/dL (ref 6–23)
Chloride: 106 mEq/L (ref 96–112)
Potassium: 3.7 mEq/L (ref 3.5–5.1)

## 2010-05-01 LAB — APTT
aPTT: 30 seconds (ref 24–37)
aPTT: 34 seconds (ref 24–37)

## 2010-05-01 LAB — LIPASE, BLOOD: Lipase: 23 U/L (ref 11–59)

## 2010-05-01 LAB — URINALYSIS, ROUTINE W REFLEX MICROSCOPIC
Ketones, ur: NEGATIVE mg/dL
Urobilinogen, UA: 0.2 mg/dL (ref 0.0–1.0)

## 2010-05-01 LAB — POCT I-STAT, CHEM 8
BUN: 15 mg/dL (ref 6–23)
Chloride: 103 mEq/L (ref 96–112)
Sodium: 139 mEq/L (ref 135–145)

## 2010-05-01 LAB — PROTIME-INR
INR: 2.46 — ABNORMAL HIGH (ref 0.00–1.49)
INR: 2.5 — ABNORMAL HIGH (ref 0.00–1.49)
INR: 2.51 — ABNORMAL HIGH (ref 0.00–1.49)
INR: 2.88 — ABNORMAL HIGH (ref 0.00–1.49)
Prothrombin Time: 26.8 seconds — ABNORMAL HIGH (ref 11.6–15.2)

## 2010-05-01 LAB — HEPARIN LEVEL (UNFRACTIONATED): Heparin Unfractionated: <0.1 IU/mL — ABNORMAL LOW (ref 0.30–0.70)

## 2010-05-01 LAB — MRSA PCR SCREENING: MRSA by PCR: NEGATIVE

## 2010-05-01 LAB — TROPONIN I
Troponin I: 0.01 ng/mL (ref 0.00–0.06)
Troponin I: 0.02 ng/mL (ref 0.00–0.06)

## 2010-05-01 LAB — LACTIC ACID, PLASMA: Lactic Acid, Venous: 1.4 mmol/L (ref 0.5–2.2)

## 2010-05-03 LAB — URINALYSIS, ROUTINE W REFLEX MICROSCOPIC
Bilirubin Urine: NEGATIVE
Glucose, UA: NEGATIVE mg/dL
Hgb urine dipstick: NEGATIVE
Ketones, ur: NEGATIVE mg/dL
Nitrite: NEGATIVE
Protein, ur: NEGATIVE mg/dL
Specific Gravity, Urine: 1.004 — ABNORMAL LOW (ref 1.005–1.030)
Urobilinogen, UA: 0.2 mg/dL (ref 0.0–1.0)
pH: 7.5 (ref 5.0–8.0)

## 2010-05-03 LAB — COMPREHENSIVE METABOLIC PANEL
ALT: 60 U/L — ABNORMAL HIGH (ref 0–35)
Alkaline Phosphatase: 71 U/L (ref 39–117)
Calcium: 8.7 mg/dL (ref 8.4–10.5)
Chloride: 106 mEq/L (ref 96–112)
Creatinine, Ser: 0.7 mg/dL (ref 0.4–1.2)
GFR calc Af Amer: >60 mL/min (ref >60)
GFR calc non Af Amer: >60 mL/min (ref >60)
Potassium: 3.9 mEq/L (ref 3.5–5.1)

## 2010-05-03 LAB — DIFFERENTIAL
Basophils Absolute: 0.1 K/uL (ref 0.0–0.1)
Basophils Relative: 1 % (ref 0–1)
Eosinophils Absolute: 0.5 K/uL (ref 0.0–0.7)
Eosinophils Relative: 4 % (ref 0–5)
Lymphocytes Relative: 12 % (ref 12–46)
Lymphs Abs: 1.4 K/uL (ref 0.7–4.0)
Monocytes Absolute: 0.8 K/uL (ref 0.1–1.0)
Monocytes Relative: 6 % (ref 3–12)
Neutro Abs: 9 K/uL — ABNORMAL HIGH (ref 1.7–7.7)
Neutrophils Relative %: 76 % (ref 43–77)

## 2010-05-03 LAB — CBC
MCV: 90.4 fL (ref 78.0–100.0)
Platelets: 200 10*3/uL (ref 150–400)
WBC: 11.8 10*3/uL — ABNORMAL HIGH (ref 4.0–10.5)

## 2010-05-23 ENCOUNTER — Ambulatory Visit (INDEPENDENT_AMBULATORY_CARE_PROVIDER_SITE_OTHER): Payer: Medicare Other | Admitting: *Deleted

## 2010-05-23 DIAGNOSIS — I4892 Unspecified atrial flutter: Secondary | ICD-10-CM

## 2010-05-23 DIAGNOSIS — I4891 Unspecified atrial fibrillation: Secondary | ICD-10-CM

## 2010-05-24 ENCOUNTER — Telehealth: Payer: Self-pay | Admitting: Internal Medicine

## 2010-05-24 ENCOUNTER — Ambulatory Visit (INDEPENDENT_AMBULATORY_CARE_PROVIDER_SITE_OTHER)
Admission: RE | Admit: 2010-05-24 | Discharge: 2010-05-24 | Disposition: A | Discharge status: Home / Self Care | Payer: Medicare Other | Source: Ambulatory Visit | Attending: Internal Medicine | Admitting: Internal Medicine

## 2010-05-24 ENCOUNTER — Encounter: Payer: Self-pay | Admitting: Internal Medicine

## 2010-05-24 ENCOUNTER — Ambulatory Visit (INDEPENDENT_AMBULATORY_CARE_PROVIDER_SITE_OTHER): Payer: Medicare Other | Admitting: Internal Medicine

## 2010-05-24 VITALS — BP 122/82 | HR 84 | Temp 97.5°F | Wt 111.0 lb

## 2010-05-24 DIAGNOSIS — M25552 Pain in left hip: Secondary | ICD-10-CM

## 2010-05-24 DIAGNOSIS — M25559 Pain in unspecified hip: Secondary | ICD-10-CM

## 2010-05-24 NOTE — Telephone Encounter (Signed)
Please call patient with results: hips with mild to moderate arthritis right and left. No fracture or other abnormality.  Tx - first try apap 500mg  tid        For persistent pain - refer to PT

## 2010-05-24 NOTE — Patient Instructions (Signed)
Hip pain - exam is pretty normal making degenerative joint disease more likely. Plan - x-rays of hips and pelvis. You may continue to use tramadol and tylenol as needed.  The rash has me stumped. I suggest a return visit to Dr. Terri Piedra. For itching from the rash it is ok to claritin 10mg  twice a day and rantidine 150 mg AM and PM.

## 2010-05-24 NOTE — Progress Notes (Signed)
  Subjective:    Patient ID: Laurie Larsen, female    DOB: 04/29/1923, 78 y.o.   MRN: [id number]  HPI [name] seen as a new patient on March 27th. She presents today due to continued pain in the left hip. It will awaken her in the AM and she reports pain in the groin. She has pain with standing or walking. No relief with APAP. She will get some relief with tramadol. No rotation of the hip.  She has a persistent rash on the shoulders, back, hips and arms. She is concerned it may be due to generic lipitor but saw no improvement with holding the medication.   PMH, FamHx and SocHx reviewed for any changes and relevance.    Review of Systems  Constitutional: Positive for activity change. Negative for chills and fatigue.  HENT: Negative for nosebleeds, congestion and neck pain.   Eyes: Negative for photophobia, discharge and itching.  Respiratory: Negative for cough, chest tightness and shortness of breath.   Cardiovascular: Negative.   Genitourinary: Negative.   Musculoskeletal:       [Left hip and groin pain. Some back pain and right hip pain.  [all other systems reviewed and are negative       Objective:   Physical Exam Elderly and frail appearing white woman in no acute distress Chest - clear to A&P Cor - RRR MSK - nl ROM knees; nl SLR sitting; trace reflex at patellar tendon bilaterally. Right hip with nl ROM without tenderness. Left hip - good ROM without tenderness. Tender to pressure against the greater trochanter; tender to AP pressure in the groin. Skin - erythematous macular/papular rash on the back with some excoriations.       Assessment & Plan:  1. Left hip pain - doubt fracture, but suspect DJD  Plan - bilateral frog leg views            Pelvis r/o fracture  2. Derm - eruption of some kind.  Plan - refer back to dermatologist.

## 2010-05-27 ENCOUNTER — Telehealth: Payer: Self-pay | Admitting: Cardiology

## 2010-05-27 NOTE — Telephone Encounter (Signed)
States she has had a rash since 1st of Feb. Rash over arms,back,chest,& thighs. Saw Dr. Terri Piedra. He gaveher some cream which really didn't help. Called him back and gave her Atarax. She takes that at night but still hasn't helped. She has tried stopping Lipitor; drug store changed different company and that didn't help. Also changed KCL different companies but hasn't helped. Rash is sometimes red with whelps and is itching. Has also seen Dr. Debby Bud but he didn't know the cause. She is wondering if could be Atenolol. Will speak w/ Dr. Swaziland tomorrow.

## 2010-05-27 NOTE — Telephone Encounter (Signed)
Has a question about a possible allergic rx to her meds

## 2010-05-28 ENCOUNTER — Telehealth: Payer: Self-pay | Admitting: *Deleted

## 2010-05-28 NOTE — Telephone Encounter (Signed)
Patient informed. 

## 2010-05-31 NOTE — Telephone Encounter (Signed)
Spoke w/pt. °

## 2010-05-31 NOTE — Telephone Encounter (Signed)
Have tried several days to get back in touch w/ her re: rash. Per Dr. Swaziland needs to get back in touch w/ Dr. Terri Piedra. States he couldn't see her until end of May so she saw Dr. Donzetta Starch yesterday. He gave her some stronger cream and told her to call them back in a week. States rash is some better today. To see him back in a couple of weeks.

## 2010-06-10 ENCOUNTER — Other Ambulatory Visit: Payer: Self-pay | Admitting: *Deleted

## 2010-06-10 MED ORDER — WARFARIN SODIUM 5 MG PO TABS: 5.0000 mg | ORAL_TABLET | ORAL | Status: DC

## 2010-06-10 NOTE — Telephone Encounter (Signed)
Coumadin ordered

## 2010-06-11 NOTE — Procedures (Signed)
DUPLEX DEEP VENOUS EXAM - UPPER EXTREMITY   INDICATION:  Left arm swelling.   HISTORY:  Edema:  No.  Trauma/Surgery:  Pacemaker placement a week ago.  Pain:  No.  PE:  No.  Previous DVT:  No.  Anticoagulants:  No.  Other:   DUPLEX EXAM:                                             Bas/                IJV   SCV     AXV    BrachV  Ceph V                R  L  R   L   R  L   R   L   R  L  Thrombosis    O  o  o   o      o       +      o  Spontaneous   +  +  +   +      +       o      +  Phasic        +  +  +   +      +       o      +  Augmentation  +  +  +   +      +       o      +  Compressible  +  +  +   +      +       o  Competent     +  +  +   +      +              +  Legend:  + - yes  o - no  p - partial  D - decreased   COMPRESSIBLE BAS/CEPH LEFT:  +   IMPRESSION:  Deep venous thrombosis noted in the left brachial vein.   ___________________________________________  P. Liliane Bade, M.D.   AC/MEDQ  D:  06/27/2008  T:  06/27/2008  Job:  [id number]

## 2010-06-11 NOTE — Discharge Summary (Signed)
NAME:  KEONTA, MONCEAUX NO.:  [id number]   MEDICAL RECORD NO.:  [id number]          PATIENT TYPE:  INP   LOCATION:  2003                         FACILITY:  [facility]   PHYSICIAN:  Elmore Guise., M.D.DATE OF BIRTH:  1923/05/07   DATE OF ADMISSION:  06/20/2008  DATE OF DISCHARGE:  06/21/2008                               DISCHARGE SUMMARY   DISCHARGE DIAGNOSES:  1. Sick sinus syndrome, status post dual-chamber permanent pacemaker      implant.  2. History of atrial fibrillation.  3. History of hypertension.  4. History of branch vessel coronary artery disease.  5. History of dyslipidemia.   HISTORY OF PRESENT ILLNESS:  Ms. Laurie Larsen is a very pleasant 77 year old  white female who has been having difficulty with bradycardia for some  time.  We have tried to decrease down her medicines; however, her  symptoms of fatigue and lightheadedness continued.  She had a Holter  monitor done which showed her heart rate sustaining in the 40-50 range.  She was referred for pacemaker implant.   HOSPITAL COURSE:  The patient underwent dual-chamber permanent pacemaker  implant on Jun 20, 2008.  She tolerated the procedure well.  Postprocedure day 1 was uncomplicated.  Her chest x-ray showed  appropriate position of her atrial and ventricular leads.  Her wound  site had no swelling or erythema.  Her pacemaker checked out with good  sensing, threshold, and impedance in both leads.  Her blood pressure was  well controlled during her hospitalization, and on discharge, blood  pressure was 120/70.  Her Dyazide was held during her hospitalization.  We did continue her atenolol and amiodarone.  She will be discharged  home today with routine post pacemaker restrictions.  A restriction  sheet was given to the patient.  This included keeping her wound clean  and dry for 1 week.  She is to Betadine her Steri-Strips once daily for  the next 3 days.  She is to call the office if she has any  swelling or  redness at that area.  She was also instructed to not do any strenuous  activity for the next week.  She should keep her left arm below shoulder  level for the next 2-3 weeks.  No heavy lifting greater than 5 pounds  with her left arm over the same time period.  On review of her medicines  per her medication reconciliation form, all of her medicines are the  same except we will hold her Coumadin for 1 week and we stopped her  Dyazide diuretic.  She will continue her atenolol and amiodarone, taking  her amiodarone in the morning and atenolol at night.  All other  medicines are unchanged.  I will plan to see her back in the office in 1  week for wound check.  If she is healing up well, I will anticipate  starting her Coumadin back at that time.      Elmore Guise., M.D.  Electronically Signed     TWK/MEDQ  D:  06/21/2008  T:  06/22/2008  Job:  [id number]

## 2010-06-14 NOTE — Discharge Summary (Signed)
NAMECORDIE, Laurie Larsen              ACCOUNT NO.:  [id number]   MEDICAL RECORD NO.:  [id number]          PATIENT TYPE:  INP   LOCATION:  2027                         FACILITY:  [facility]   PHYSICIAN:  Elmore Guise., M.D.DATE OF BIRTH:  [birth year]   DATE OF ADMISSION:  DATE OF DISCHARGE:  01/12/2004                                 DISCHARGE SUMMARY   DISCHARGE DIAGNOSES:  1.  Branch vessel coronary artery disease.  2.  Status post inferior infarct.  3.  Hypertension.  4.  Dyslipidemia.  5.  Gastroesophageal reflux disease.  6.  Seasonal allergies.   BRIEF HISTORY OF PRESENT ILLNESS:  The patient is a 77 year old white female  with a past medical history of hypertension, who was admitted on January 08, 2004, with acute inferior infarct.  The patient underwent urgent heart  catheterization on the day of admission showing normal left main, LAD with  proximal to mid 20% stenosis, and mild distal luminal irregularities.  First  diagonal was moderate size with ostial 20% stenosis, ramus had 30% ostial  proximal narrowing.  The CIRC was dominant with mild luminal irregularities.  Distal PDA was a 2 mm vessel, actually approximately 1 to 1.5 mm with 100%  distal occlusion/ cutoff.  RCA was nondominant with mild luminal  irregularities.  Ejection fraction was 55% with inferior apical hypokinesis.  Left ventricular end diastolic pressure was 12 mmHg.  Due to the patient's  small size of her occluded PDA, the patient was managed medically with  heparin, Plavix, aspirin, Integrilin and metoprolol.  The patient remained  chest-pain free following her catheterization.  She had a peak troponin in  the 8 range.  She underwent echocardiogram on hospital day #2 following her  catheterization.  She had a preserved LV systolic function with inferior  septal and mild inferior apical hypokinesis.  She has now been up and  ambulatory with cardiac rehabilitation.  No exertional chest pain.  Her  breathing was at baseline.  During her hospitalization, she was noted to  have off-and-on abdominal pain which is thought due to constipation.  She  had significant relief after Dulcolax as well as Milk of Magnesia.  Her  cholesterol, LDL level was 91.  The patient was placed on Lipitor 20 for her  management.   DISCHARGE MEDICATIONS:  She will be discharged home on the following  medications:  1.  Aspirin 81 mg daily.  2.  Plavix 75 mg daily.  3.  Atenolol 50 mg q. daily.  (The patient was told to stop her p.m. dose).  4.  Dyazide 37.5 mg/ 25 mg daily.  5.  Lisinopril 10 mg daily.  6.  Synthroid 125 micrograms daily.  7.  Zyrtec 10 mg daily.  8.  Lipitor 20 mg daily.  9.  Protonix 40 mg daily.  10. Pain management is to be Tylenol Extra Strength, one to two q.6h. p.r.n.   DISCHARGE ACTIVITY:  The patient was instructed to do no strenuous activity  for the next two weeks, then slowly resume her normal activity as tolerated.  She was set up for  cardiac rehabilitation.   DISCHARGE DIET:  Low-fat, low-salt.   FOLLOWUP:  The patient will follow up with Dr. Reyes Ivan at [facility] in one to two weeks.  She will call office at [phone] for an  appointment, or any further questions.  She will also follow up with Dr.  __________  for post-hospital visits in approximately two weeks.       TWK/MEDQ  D:  01/12/2004  T:  01/12/2004  Job:  [id number]

## 2010-06-14 NOTE — H&P (Signed)
NAME:  Laurie Larsen, ROLLER NO.:  [id number]   MEDICAL RECORD NO.:  [id number]          PATIENT TYPE:  INP   LOCATION:  1828                         FACILITY:  [facility]   PHYSICIAN:  Elmore Guise., M.D.DATE OF BIRTH:  April 13, 1923   DATE OF ADMISSION:  01/08/2004  DATE OF DISCHARGE:                                HISTORY & PHYSICAL   INDICATION FOR ADMISSION:  Acute inferior MI.   HISTORY OF PRESENT ILLNESS:  The patient is a 77 year old white female with  a past medical history of hypertension, paroxysmal atrial fibrillation, and  questionable history of congestive heart failure who presents with a two  hour history of substernal chest pain. The history is obtained from the  husband as the patient is in moderate distress. The husband reports the  patient was in normal state of health until right after lunch when she  started having severe chest pain. Reports mild shortness of breath  associated with chest pain as well as nausea and diaphoresis. The patient  was brought to the ER where on evaluation she had very subtle approximately  0.5 mm ST elevation in the inferior leads as well as reciprocal depression  in the lateral limb leads. The hospital reports episodes of shortness of  breath and chest pain over the last couple of years. He does report  occasional episodes of noncompliance. Her primary care physician is Dr.  Dagoberto Ligas.   REVIEW OF SYSTEMS:  Positive for occasional cough and malaise, otherwise no  significant complaints.   CURRENT MEDICATIONS:  Unknown per husband.   ALLERGIES:  None.   FAMILY HISTORY:  Noncontributory.   SOCIAL HISTORY:  She has been married for 57 years. No tobacco or alcohol.   PHYSICAL EXAMINATION:  VITAL SIGNS: Blood pressure is 176/80, heart rate 84  and regular, respirations 16. She is saturating at 96% on four liters nasal  cannula.  GENERAL: She is an elderly white female, alert and oriented times four, in  moderate  distress.  NECK: Supple. No lymphadenopathy. 2+ carotids. No JVD.  LUNGS: Clear with decreased breath sounds in the bases.  HEART: Regular with a normal S1. Positive S4 noted.  ABDOMEN: Soft, nontender, nondistended.  EXTREMITIES: Warm with 2+ pulses and no edema.   Chest x-ray shows no acute cardiopulmonary disease, poor inspiratory effort.  ECG shows very subtle, approximately 0.5 mm ST elevation in the inferior  leads with reciprocal depression in I and aVL.   LABORATORY DATA:  BUN 17, creatinine 1.0, hemoglobin 13.3. CPK-MB 1.6,  myoglobin 21.2, troponin-I less than 0.05.   The patient was taken urgently to the cath lab where she underwent cardiac  catheterization, coronary angiogram, LV angiogram, aortic root angiogram,  which sowed nonobstructive major epicardial coronaries with distal branch  vessel disease with a distal PDA occlusion. Her EF was 55% with inferoapical  hypokinesis. The patient did have significantly elevated blood pressures  throughout her catheterization with systolic blood pressure in the 200/110  range. She was given labetalol 10 mg times two doses with significant  improvement in her blood pressure. She is currently resting in  no acute  distress in the holding area waiting for her room.   IMPRESSION:  1.  Acute inferior myocardial infarction with distal branch vessel occlusion      of her posterior descending artery .  2.  Hypertensive heart disease.  3.  History of congestive heart failure.   PLAN:  Aggressive blood pressure control as well as medical management. Will  start heparin, aspirin, Plavix, Integrilin, Lopressor, Captopril, as well as  check her fasting lipid panel. Will continue serial cardiac enzymes and  serial EKGs. Discussed plans and cardiac catheterization at length with the  patient's husband and daughter.       TWK/MEDQ  D:  01/08/2004  T:  01/08/2004  Job:  [id number]

## 2010-06-14 NOTE — Cardiovascular Report (Signed)
NAME:  Laurie Larsen, Laurie Larsen              ACCOUNT NO.:  [id number]   MEDICAL RECORD NO.:  [id number]          PATIENT TYPE:  INP   LOCATION:  1828                         FACILITY:  [facility]   PHYSICIAN:  Elmore Guise., M.D.DATE OF BIRTH:  Feb 17, 1923   DATE OF PROCEDURE:  01/08/2004  DATE OF DISCHARGE:                              CARDIAC CATHETERIZATION   PROCEDURE:  Left heart catheterization, coronary angiogram, left ventricular  angiogram, aortic root angiogram.   DESCRIPTION OF PROCEDURE:  The patient was brought to the cardiac  catheterization lab urgently after presenting with inferior ST elevation.  Appropriate informed consent was obtained.  The patient was prepped and  draped in a sterile fashion.  A 6 French sheath was placed in the right  femoral artery without difficulty.  Coronary angiography was then performed  without complications.   RESULTS:  1.  Left main normal.  2.  LAD:  Proximal to mid 20% stenosis with mild distal luminal      irregularities.  3.  First diagonal:  Moderate size with ostial 20% stenosis.  4.  LCX:  Dominant with mild luminal irregularities, distal PDA less than 2      mm vessel, approximately 1 to 1.5 mm, with 100% distal occlusion/cut      off.  5.  RCA:  Nondominant with mild luminal irregularis.  6.  LVEF:  65% with inferior apical hypokinesis, LVEDP 12 mmHg.  7.  Aortic root has mild dilatation with no dissection and no aortic      insufficiency.   IMPRESSION:  1.  Distal branch vessel occlusion/disease with distal PDA occlusion.  2.  Nonobstructive major epicardial coronary arteries.  3.  EF 55% with inferior apical hypokinesis.   PLAN:  1.  Aggressive medical management with heparin, aspirin, Plavix, Integrilin,      beta blocker, and Ace inhibitor.  Will plan for aggressive blood      pressure control as well as risk factor modification.       TWK/MEDQ  D:  01/08/2004  T:  01/08/2004  Job:  [id number]

## 2010-06-20 ENCOUNTER — Ambulatory Visit (INDEPENDENT_AMBULATORY_CARE_PROVIDER_SITE_OTHER): Payer: Medicare Other | Admitting: *Deleted

## 2010-06-20 DIAGNOSIS — I4891 Unspecified atrial fibrillation: Secondary | ICD-10-CM

## 2010-06-20 LAB — POCT INR
INR: 1.5
INR: 1.5

## 2010-07-04 ENCOUNTER — Ambulatory Visit (INDEPENDENT_AMBULATORY_CARE_PROVIDER_SITE_OTHER): Payer: Medicare Other | Admitting: *Deleted

## 2010-07-04 DIAGNOSIS — I4891 Unspecified atrial fibrillation: Secondary | ICD-10-CM

## 2010-07-08 ENCOUNTER — Encounter: Payer: Self-pay | Admitting: Internal Medicine

## 2010-07-09 ENCOUNTER — Other Ambulatory Visit (INDEPENDENT_AMBULATORY_CARE_PROVIDER_SITE_OTHER): Payer: Medicare Other

## 2010-07-09 ENCOUNTER — Encounter: Payer: Self-pay | Admitting: Internal Medicine

## 2010-07-09 ENCOUNTER — Ambulatory Visit (INDEPENDENT_AMBULATORY_CARE_PROVIDER_SITE_OTHER): Payer: Medicare Other | Admitting: Internal Medicine

## 2010-07-09 DIAGNOSIS — Z79899 Other long term (current) drug therapy: Secondary | ICD-10-CM

## 2010-07-09 DIAGNOSIS — I10 Essential (primary) hypertension: Secondary | ICD-10-CM

## 2010-07-09 DIAGNOSIS — L299 Pruritus, unspecified: Secondary | ICD-10-CM

## 2010-07-09 DIAGNOSIS — E039 Hypothyroidism, unspecified: Secondary | ICD-10-CM

## 2010-07-09 DIAGNOSIS — R58 Hemorrhage, not elsewhere classified: Secondary | ICD-10-CM

## 2010-07-09 DIAGNOSIS — I998 Other disorder of circulatory system: Secondary | ICD-10-CM

## 2010-07-09 DIAGNOSIS — Z7901 Long term (current) use of anticoagulants: Secondary | ICD-10-CM

## 2010-07-09 DIAGNOSIS — D6832 Hemorrhagic disorder due to extrinsic circulating anticoagulants: Secondary | ICD-10-CM

## 2010-07-09 LAB — CBC WITH DIFFERENTIAL/PLATELET
Basophils Absolute: 0.1 10*3/uL (ref 0.0–0.1)
Eosinophils Absolute: 0.3 10*3/uL (ref 0.0–0.7)
MCHC: 35.1 g/dL (ref 30.0–36.0)
MCV: 93.4 fl (ref 78.0–100.0)
Monocytes Absolute: 0.6 10*3/uL (ref 0.1–1.0)
Neutrophils Relative %: 76.5 % (ref 43.0–77.0)
Platelets: 206 10*3/uL (ref 150.0–400.0)
RDW: 14.1 % (ref 11.5–14.6)

## 2010-07-09 LAB — HEPATIC FUNCTION PANEL
ALT: 25 U/L (ref 0–35)
Bilirubin, Direct: 0.2 mg/dL (ref 0.0–0.3)
Total Protein: 6.5 g/dL (ref 6.0–8.3)

## 2010-07-09 LAB — SEDIMENTATION RATE: Sed Rate: 15 mm/hr (ref 0–22)

## 2010-07-09 LAB — COMPREHENSIVE METABOLIC PANEL
AST: 26 U/L (ref 0–37)
Albumin: 4 g/dL (ref 3.5–5.2)
Alkaline Phosphatase: 67 U/L (ref 39–117)
BUN: 19 mg/dL (ref 6–23)
Potassium: 3.4 mEq/L — ABNORMAL LOW (ref 3.5–5.1)
Sodium: 135 mEq/L (ref 135–145)
Total Protein: 6.5 g/dL (ref 6.0–8.3)

## 2010-07-09 MED ORDER — LEVOTHYROXINE SODIUM 125 MCG PO TABS: 125.0000 ug | ORAL_TABLET | Freq: Every day | ORAL | Status: DC

## 2010-07-09 NOTE — Patient Instructions (Signed)
Itching - OK to take an antihistamine, either atarax or claritin, and an H2 blocker - Zantac (ranitidine) 150mg  AM and PM. Will check labs to rule out any underlying metabolic abnormality that could contribute to itching  Bruising - will check a blood count today to check on platelet count. We will also look for anemia.

## 2010-07-09 NOTE — Progress Notes (Signed)
Subjective:    Patient ID: Laurie Larsen, female    DOB: Oct 02, 1923, 77 y.o.   MRN: [id number]  HPI Laurie Larsen presents due to increased bruising of the upper extremities and the back of the neck. She does bruise very easily even with scratching her skin. She is on warfarin with last INR 2.3 last week.  She is on this for stroke risk reduction in setting of atrial fibrillation. Last CBC Jan  '11.  She reports that she has terrible pain in the legs when she first wakes up, feels like her legs are broken, pain from hip to ankle. She carries a diagnosis of Sciatica and Dr. Ethelene Hal has told her this originates in her back.  She does get relief with APAP, Tramadol. She has had CT back by Dr. Ethelene Hal.  Past Medical History  Diagnosis Date  . Permanent atrial fibrillation   . HTN (hypertension)   . Diastolic dysfunction   . CAD (coronary artery disease)     W/occlusion of distal circumflex branch 06/21/03  . Dyslipidemia   . GERD (gastroesophageal reflux disease)   . Sick sinus syndrome     W/DDD pacemaker  . Chronic pain     ? due to arthritis  . Glaucoma   . Chronic anticoagulation   . Hyperlipidemia   . Diverticulitis large intestine      by history  . Allergy   . Phlebitis     after pacemeker placement  . Colon polyps   . Thyroid disease     hypothyroidism on medications  . Transfusion history     after childbirth 60 years ago 1953-06-21)  . CHF (congestive heart failure)     hospitalized 06/20/2008  . Rash/skin eruption     multiple episodes - wide distribution-intermittent, possibly drug rash.   Past Surgical History  Procedure Date  . Appendectomy   . Abdominal hysterectomy   . Tonsillectomy   . Kidney surgery     Right  . Ankle fracture surgery     Left, pin  . Pacemaker insertion     Dr Reyes Ivan ([facility]) 5.25.2010  . Breast surgery     Needle guided excision, right breast calcification  . Breast fibroadenoma surgery     Right   Family History  Problem Relation Age of Onset  .  Cancer Mother     colon with mets  . Diabetes Mother   . Heart disease Father   . Hyperlipidemia Father   . Hypertension Father   . Stroke Brother     brother who died 06/20/22  . Cancer Paternal Aunt     breast  . Cancer Cousin     uterine, lung cancer   History   Social History  . Marital Status: Married    Spouse Name: N/A    Number of Children: N/A  . Years of Education: 13   Occupational History  . RETIRED    Social History Main Topics  . Smoking status: Never Smoker   . Smokeless tobacco: Never Used  . Alcohol Use: No  . Drug Use: Not on file  . Sexually Active: No   Other Topics Concern  . 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  Married 1948. 3 sons - 2050/06/21, 06/21/55, 06-20-2056; 5 grandchildren-one deceased -  OD @ 25.  Lives alone with husband and they are independent in ADLs.  End  of Life Care: no prolonged heroic measures, i.e. Artificial feeding or hydration; DNR; no prolonged intubation.       Review of Systems Review of Systems  Constitutional:  Negative for fever, chills, activity change and unexpected weight change.  HEENT:  Negative for hearing loss, ear pain, congestion, neck stiffness and postnasal drip. Negative for sore throat or swallowing problems. Negative for dental complaints.   Eyes: Negative for vision loss or change in visual acuity.  Respiratory: Negative for chest tightness and wheezing.   Cardiovascular: Negative for chest pain and palpitation. No decreased exercise tolerance Gastrointestinal: No change in bowel habit. No bloating or gas. No reflux or indigestion Genitourinary: Negative for urgency, frequency, flank pain and difficulty urinating.  Musculoskeletal: Negative for back pain, arthralgias and gait problem. Positive for atypical sciatica of both legs.  Neurological: Negative for dizziness, tremors, weakness and headaches.  Hematological:  Negative for adenopathy.  Psychiatric/Behavioral: Negative for behavioral problems and dysphoric mood.       Objective:   Physical Exam Vitals reviewed Gen'l - frail, elderly white woman in no acute distress HEENT - C&S clear, oropharynx clear Chest - CTAP, no rales ore wheezes Cor - IRIR rate controlled.  Abd - BS + x 4, no guarding Ext - w/o edema. Derm -extensive subcutaneous bleeding right UE proximally but to a lesser extent distally and also in the left UE. There are other areas that are less remarkable neck, legs.         Assessment & Plan:

## 2010-07-10 ENCOUNTER — Telehealth: Payer: Self-pay | Admitting: Cardiology

## 2010-07-10 DIAGNOSIS — Z7901 Long term (current) use of anticoagulants: Secondary | ICD-10-CM | POA: Insufficient documentation

## 2010-07-10 NOTE — Telephone Encounter (Signed)
Tried to call pt, no answer, will try later

## 2010-07-10 NOTE — Assessment & Plan Note (Signed)
Patinet hs seen dermatology - she was advised to not take claritin. Another dermatologist had prescribed atarax.  Plan - she is advised to take ranitidine bid and either atarax or for less sedation claritin           Continue with topical steroid cream under the direction of her dermatologist.

## 2010-07-10 NOTE — Telephone Encounter (Signed)
PCP IS GOING TO ASK DR [place] IF PT CAN DC COUMADIN DUE TO FREQUENT BRYISING. PT SAID HAS AFIB AND DEFIB SO SHE IS VERY CONCERNED ABOUT THIS. WANTS TO DISCUSS WITH DR [place]. AWARE THAT ANITA IS NOT IN AND THAT DR [place] IS AT THE HOSPITAL TODAY.

## 2010-07-10 NOTE — Telephone Encounter (Signed)
If she is bruising easily I would favor stopping her ASA instead of her coumadin. She has a [place] score of at least 2. Laurie Larsen

## 2010-07-10 NOTE — Assessment & Plan Note (Signed)
Patinet had recent INR that was therapeutic and will not repeat today. Will check other labs.  Plan - care to avoid bumps, etc           Consideration of lesser protection, i.e. ASA

## 2010-07-10 NOTE — Telephone Encounter (Signed)
Spoke with pt/saw her PCP Dr Arthur Holms. Having episodes of itching without rash. Lab panel drawn and waiting to hear back. Pt very concerned because he stated she may be able to come off coumadin due to excessive bruising and go on asa 81mg . Pt concerned with that idea since she uses her pacemaker 40% of the time and is afraid of stroke. She wants you to advise her. Alfonso Ramus RN

## 2010-07-11 ENCOUNTER — Telehealth: Payer: Self-pay | Admitting: *Deleted

## 2010-07-11 NOTE — Telephone Encounter (Signed)
Advised if she is bruising easily she may stop asa but not coumadin, pt to follow up prn or call with any further questions or concerns.Alfonso Ramus RN

## 2010-07-11 NOTE — Telephone Encounter (Signed)
Informed may stop asa but not coumadin, pt aware and will keep f/u app w/ dr Alvino Chapel.

## 2010-07-15 ENCOUNTER — Encounter: Payer: Self-pay | Admitting: Internal Medicine

## 2010-07-18 ENCOUNTER — Ambulatory Visit (INDEPENDENT_AMBULATORY_CARE_PROVIDER_SITE_OTHER): Payer: Medicare Other | Admitting: *Deleted

## 2010-07-18 DIAGNOSIS — I4891 Unspecified atrial fibrillation: Secondary | ICD-10-CM

## 2010-08-01 ENCOUNTER — Ambulatory Visit (INDEPENDENT_AMBULATORY_CARE_PROVIDER_SITE_OTHER): Payer: Medicare Other | Admitting: *Deleted

## 2010-08-01 DIAGNOSIS — I4891 Unspecified atrial fibrillation: Secondary | ICD-10-CM

## 2010-08-01 LAB — POCT INR: INR: 2.7

## 2010-08-04 ENCOUNTER — Emergency Department (HOSPITAL_COMMUNITY): Payer: Medicare Other

## 2010-08-04 ENCOUNTER — Inpatient Hospital Stay (HOSPITAL_COMMUNITY)
Admission: EM | Admit: 2010-08-04 | Discharge: 2010-08-06 | DRG: 204 | Disposition: A | Discharge status: Home Health | Payer: Medicare Other | Attending: Internal Medicine | Admitting: Internal Medicine

## 2010-08-04 DIAGNOSIS — Z950 Presence of cardiac pacemaker: Secondary | ICD-10-CM

## 2010-08-04 DIAGNOSIS — J189 Pneumonia, unspecified organism: Secondary | ICD-10-CM

## 2010-08-04 DIAGNOSIS — E785 Hyperlipidemia, unspecified: Secondary | ICD-10-CM | POA: Diagnosis present

## 2010-08-04 DIAGNOSIS — E039 Hypothyroidism, unspecified: Secondary | ICD-10-CM | POA: Diagnosis present

## 2010-08-04 DIAGNOSIS — R918 Other nonspecific abnormal finding of lung field: Principal | ICD-10-CM | POA: Diagnosis present

## 2010-08-04 DIAGNOSIS — I4891 Unspecified atrial fibrillation: Secondary | ICD-10-CM | POA: Diagnosis present

## 2010-08-04 DIAGNOSIS — I10 Essential (primary) hypertension: Secondary | ICD-10-CM | POA: Diagnosis present

## 2010-08-04 DIAGNOSIS — Z7901 Long term (current) use of anticoagulants: Secondary | ICD-10-CM

## 2010-08-04 DIAGNOSIS — H409 Unspecified glaucoma: Secondary | ICD-10-CM | POA: Diagnosis present

## 2010-08-04 DIAGNOSIS — E860 Dehydration: Secondary | ICD-10-CM | POA: Diagnosis present

## 2010-08-04 DIAGNOSIS — I5032 Chronic diastolic (congestive) heart failure: Secondary | ICD-10-CM | POA: Diagnosis present

## 2010-08-04 DIAGNOSIS — M51379 Other intervertebral disc degeneration, lumbosacral region without mention of lumbar back pain or lower extremity pain: Secondary | ICD-10-CM | POA: Diagnosis present

## 2010-08-04 DIAGNOSIS — M5137 Other intervertebral disc degeneration, lumbosacral region: Secondary | ICD-10-CM | POA: Diagnosis present

## 2010-08-04 DIAGNOSIS — I2510 Atherosclerotic heart disease of native coronary artery without angina pectoris: Secondary | ICD-10-CM | POA: Diagnosis present

## 2010-08-04 DIAGNOSIS — I509 Heart failure, unspecified: Secondary | ICD-10-CM | POA: Diagnosis present

## 2010-08-04 DIAGNOSIS — Z7982 Long term (current) use of aspirin: Secondary | ICD-10-CM

## 2010-08-04 LAB — CBC
HCT: 36.3 % (ref 36.0–46.0)
Hemoglobin: 12.8 g/dL (ref 12.0–15.0)
MCV: 90.3 fL (ref 78.0–100.0)
RBC: 4.02 MIL/uL (ref 3.87–5.11)
WBC: 6.4 10*3/uL (ref 4.0–10.5)

## 2010-08-04 LAB — BASIC METABOLIC PANEL
BUN: 22 mg/dL (ref 6–23)
Calcium: 8.4 mg/dL (ref 8.4–10.5)
Creatinine, Ser: 0.6 mg/dL (ref 0.50–1.10)
GFR calc non Af Amer: >60 mL/min (ref >60)
Glucose, Bld: 91 mg/dL (ref 70–99)
Sodium: 135 mEq/L (ref 135–145)

## 2010-08-04 LAB — CK TOTAL AND CKMB (NOT AT ARMC)
CK, MB: 2.9 ng/mL (ref 0.3–4.0)
Total CK: 69 U/L (ref 7–177)

## 2010-08-04 LAB — DIFFERENTIAL
Eosinophils Relative: 4 % (ref 0–5)
Lymphocytes Relative: 11 % — ABNORMAL LOW (ref 12–46)
Lymphs Abs: 0.7 10*3/uL (ref 0.7–4.0)
Neutrophils Relative %: 76 % (ref 43–77)

## 2010-08-04 LAB — CARDIAC PANEL(CRET KIN+CKTOT+MB+TROPI)
CK, MB: 2.4 ng/mL (ref 0.3–4.0)
Relative Index: INVALID (ref 0.0–2.5)
Troponin I: <0.3 ng/mL (ref <0.30)

## 2010-08-04 LAB — PROTIME-INR
INR: 1.82 — ABNORMAL HIGH (ref 0.00–1.49)
Prothrombin Time: 21.4 seconds — ABNORMAL HIGH (ref 11.6–15.2)

## 2010-08-05 ENCOUNTER — Inpatient Hospital Stay (HOSPITAL_COMMUNITY): Payer: Medicare Other

## 2010-08-05 ENCOUNTER — Encounter (HOSPITAL_COMMUNITY): Payer: Self-pay | Admitting: Radiology

## 2010-08-05 DIAGNOSIS — R918 Other nonspecific abnormal finding of lung field: Secondary | ICD-10-CM

## 2010-08-05 DIAGNOSIS — M25559 Pain in unspecified hip: Secondary | ICD-10-CM

## 2010-08-05 DIAGNOSIS — M549 Dorsalgia, unspecified: Secondary | ICD-10-CM

## 2010-08-05 DIAGNOSIS — I4891 Unspecified atrial fibrillation: Secondary | ICD-10-CM

## 2010-08-05 LAB — CBC
MCHC: 34.4 g/dL (ref 30.0–36.0)
Platelets: 168 10*3/uL (ref 150–400)
RDW: 13.3 % (ref 11.5–15.5)
WBC: 6.4 10*3/uL (ref 4.0–10.5)

## 2010-08-05 LAB — PROTIME-INR
INR: 2.75 — ABNORMAL HIGH (ref 0.00–1.49)
Prothrombin Time: 29.5 seconds — ABNORMAL HIGH (ref 11.6–15.2)

## 2010-08-05 LAB — DIFFERENTIAL
Basophils Absolute: 0 10*3/uL (ref 0.0–0.1)
Eosinophils Absolute: 0.3 10*3/uL (ref 0.0–0.7)
Eosinophils Relative: 5 % (ref 0–5)

## 2010-08-05 LAB — BASIC METABOLIC PANEL
Chloride: 107 mEq/L (ref 96–112)
Creatinine, Ser: 0.49 mg/dL — ABNORMAL LOW (ref 0.50–1.10)
GFR calc Af Amer: >60 mL/min (ref >60)
GFR calc non Af Amer: >60 mL/min (ref >60)
Potassium: 3.4 mEq/L — ABNORMAL LOW (ref 3.5–5.1)

## 2010-08-05 MED ORDER — IOHEXOL 300 MG/ML  SOLN
80.0000 mL | Freq: Once | INTRAMUSCULAR | Status: AC | PRN
  Administered 2010-08-05: 80 mL via INTRAVENOUS

## 2010-08-06 LAB — PROTIME-INR: Prothrombin Time: 30.9 seconds — ABNORMAL HIGH (ref 11.6–15.2)

## 2010-08-09 ENCOUNTER — Other Ambulatory Visit (HOSPITAL_COMMUNITY): Payer: Self-pay | Admitting: Orthopedic Surgery

## 2010-08-09 DIAGNOSIS — M25552 Pain in left hip: Secondary | ICD-10-CM

## 2010-08-11 NOTE — H&P (Signed)
NAME:  Laurie Larsen, Laurie Larsen NO.:  [id number]  MEDICAL RECORD NO.:  [id number]  LOCATION:  [facility]                         FACILITY:  [facility]  PHYSICIAN:  Jeoffrey Massed, MD    DATE OF BIRTH:  February 03, 1923  DATE OF ADMISSION:  08/04/2010 DATE OF DISCHARGE:                             HISTORY & PHYSICAL   PRIMARY CARE PRACTITIONER:  Rosalyn Gess. Norins, MD  CHIEF COMPLAINT:  Presyncopal episode.  HISTORY OF PRESENT ILLNESS:  The patient is a very pleasant 77 year old Caucasian female with a past medical history of coronary artery disease, hypertension, atrial fibrillation on chronic Coumadin therapy, who presented to the ED today with the above-noted complaints.  Per the patient over the past few days she has been having more acute low back pain than usual.  For her low back pain, she generally takes tramadol but then today because she had to go to church and had numerous meetings she thought that she would take an extra hydrocodone.  The patient was singing in the choir today and all of sudden felt very weak and dizzy and felt as if her knees are about to give away and quickly sat down to the next available chair.  She claims she never passed out.  In any event, she was then brought to the hospital for further evaluation and treatment.  During her evaluation in the ED, she had a chest x-ray which showed a right lower lobe infiltrate.  Hospitalist Service has been asked to admit this patient for further evaluation and treatment.  Upon repeatedly asking the patient if she felt feverish over the past few days she denies this.  The patient also denies any cough or shortness of breath.  The patient denies any chest pain.  The patient denies any nausea, vomiting, diarrhea, dysuria, or hematuria.  ALLERGIES:  The patient apparently is allergic to ACTONEL and DIGOXIN.  PAST MEDICAL HISTORY: 1. Coronary artery disease. 2. Hypertension. 3. Atrial fibrillation. 4. Chronic low  back pain from spinal stenosis and degenerative disk     disease. 5. Dyslipidemia. 6. Hypothyroidism. 7. Glaucoma.  PAST SURGICAL HISTORY: 1. Appendectomy. 2. Hysterectomy. 3. Pacemaker insertion.  MEDICATIONS AT HOME:  These are not verified but per the patient she apparently takes following medications; 1. Aspirin 81 mg 1 tablet daily. 2. Atenolol 25 mg half a tablet daily. 3. Cardizem 240 mg half a tablet daily. 4. Lipitor 40 mg half a tablet daily. 5. Coumadin 5 mg everyday except on Wednesdays where she takes 7.5 mg. 6. She also claims to be on potassium at an unknown dose. 7. She also takes some eye drops that she does not recollect the name     or the dosing.  FAMILY HISTORY:  Per the patient, her brother had coronary artery disease.  SOCIAL HISTORY:  The patient lives with the husband and denies any toxic habits.  REVIEW OF SYSTEMS:  A detailed review of 12-systems were done and these are negative except for the ones noted in the HPI.  PHYSICAL EXAMINATION:  GENERAL:  Lying in bed, does not appear to be in any respiratory distress.  She is awake and alert.  Speech is clear. HEENT:  Atraumatic and normocephalic.  Pupils equally reactive to light accommodation. NECK:  Supple. CHEST:  Bilaterally clear to auscultation. ABDOMEN:  Soft, nontender and nondistended. EXTREMITIES:  Warm to touch and there is no bilateral lower extremity edema. NEUROLOGIC:  The patient is awake and alert.  She does complain of occasional dizziness and lightheadedness, but speech is clear.  She has adequate 5/5 strength in all of her four extremities and does not have any focal neurological deficits on exam.  LABORATORY DATA: 1. CBC shows WBC of 6.4, hemoglobin of 12.8, hematocrit of 36.3, and     platelet count of 169. 2. INR is 1.82. 3. First set of troponin is less than 0.30. 4. Chemistry shows sodium of 135, potassium 3.4, chloride of 101,     bicarb of 23, glucose of 91, BUN of  22, creatinine of 0.60 and     calcium of 8.4.  RADIOLOGICAL STUDIES:  Chest x-ray shows new right lower lobe infiltrate.  ASSESSMENT: 1. Presyncopal episode, possibly secondary to pneumonia along with     dehydration and possibly the patient may also be on Lasix     contributing. 2. Community-acquired pneumonia by chest x-ray, although the patient     does not have any fever, elevated white count, cough, or shortness     of breath. 3. History of atrial fibrillation, on chronic Coumadin therapy. 4. History of hypertension, currently stable. 5. History of coronary artery disease with initial cardiac markers     being negative, maintained on aspirin, beta-blockers and statins.  PLAN: 1. This patient will be admitted to the telemetry unit. 2. She will be empirically started on Rocephin and Zithromax. 3. She will be gently hydrated for now. 4. We will resume Cardizem and atenolol and Coumadin will be endorsed     per pharmacy. 5. If the patient continues to have lightheadedness and dizziness as     she may need to have her pacemaker interrogated, however, for now     we will place on tele and follow her clinical course. 6. Physical therapy will be called for further evaluation. 7. In regards to her chronic low back pain, we will use Vicodin and     Tylenol on p.r.n. basis. 8. Further plan will depend as the patient's clinical course evolves. 9. DVT prophylaxis does not needed as the patient will be on Coumadin. 10.Code status.  The patient for now wishes to be a full code.  Total time spent for admission equals 55 minutes.     Jeoffrey Massed, MD     SG/MEDQ  D:  08/04/2010  T:  08/04/2010  Job:  [id number]  cc:   Peter M. [place], M.D. Rosalyn Gess Norins, MD  Electronically Signed by Jeoffrey Massed  on 08/11/2010 11:31:54 AM

## 2010-08-19 ENCOUNTER — Other Ambulatory Visit (HOSPITAL_COMMUNITY): Payer: Medicare Other

## 2010-08-19 ENCOUNTER — Encounter (HOSPITAL_COMMUNITY): Payer: Medicare Other

## 2010-08-20 ENCOUNTER — Encounter (HOSPITAL_COMMUNITY)
Admission: RE | Admit: 2010-08-20 | Discharge: 2010-08-20 | Disposition: A | Discharge status: Home / Self Care | Payer: Medicare Other | Source: Ambulatory Visit | Attending: Orthopedic Surgery | Admitting: Orthopedic Surgery

## 2010-08-20 ENCOUNTER — Other Ambulatory Visit (HOSPITAL_COMMUNITY): Payer: Medicare Other

## 2010-08-20 ENCOUNTER — Encounter (HOSPITAL_COMMUNITY): Payer: Medicare Other

## 2010-08-20 DIAGNOSIS — M25552 Pain in left hip: Secondary | ICD-10-CM

## 2010-08-20 DIAGNOSIS — M25559 Pain in unspecified hip: Secondary | ICD-10-CM | POA: Insufficient documentation

## 2010-08-20 MED ORDER — TECHNETIUM TC 99M MEDRONATE IV KIT
25.0000 | PACK | Freq: Once | INTRAVENOUS | Status: AC | PRN
  Administered 2010-08-20: 25 via INTRAVENOUS

## 2010-08-22 ENCOUNTER — Other Ambulatory Visit: Payer: Self-pay | Admitting: Internal Medicine

## 2010-08-22 ENCOUNTER — Ambulatory Visit (INDEPENDENT_AMBULATORY_CARE_PROVIDER_SITE_OTHER): Payer: Medicare Other | Admitting: *Deleted

## 2010-08-22 ENCOUNTER — Ambulatory Visit
Admission: RE | Admit: 2010-08-22 | Discharge: 2010-08-22 | Disposition: A | Discharge status: Home / Self Care | Payer: Medicare Other | Source: Ambulatory Visit | Attending: Internal Medicine | Admitting: Internal Medicine

## 2010-08-22 ENCOUNTER — Inpatient Hospital Stay: Admission: RE | Admit: 2010-08-22 | Payer: Medicare Other | Source: Ambulatory Visit

## 2010-08-22 DIAGNOSIS — Z1231 Encounter for screening mammogram for malignant neoplasm of breast: Secondary | ICD-10-CM

## 2010-08-22 DIAGNOSIS — I4891 Unspecified atrial fibrillation: Secondary | ICD-10-CM

## 2010-08-22 LAB — POCT INR: INR: 2.2

## 2010-08-23 ENCOUNTER — Encounter: Payer: Self-pay | Admitting: Internal Medicine

## 2010-08-23 ENCOUNTER — Ambulatory Visit (INDEPENDENT_AMBULATORY_CARE_PROVIDER_SITE_OTHER): Payer: Medicare Other | Admitting: Internal Medicine

## 2010-08-23 DIAGNOSIS — I10 Essential (primary) hypertension: Secondary | ICD-10-CM

## 2010-08-23 DIAGNOSIS — L299 Pruritus, unspecified: Secondary | ICD-10-CM

## 2010-08-23 NOTE — Progress Notes (Signed)
  Subjective:    Patient ID: Laurie Larsen, female    DOB: 12-Mar-1923, 77 y.o.   MRN: [id number]  HPI Laurie Larsen presents for follow-up. In the interval she has had normal labs returned. She had a bone scan that was normal. She has had a mammogram July 26th - report cannot be retrieved at this time. She reports that the pain is better. She reports that the itching has been better. She is still having "patches/whelps." She has been taking atarax with good results.   Patient does not recall receiving copy of office note - she is given a copy today.   PMH, FamHx and SocHx reviewed for any changes and relevance.    Review of Systems  Constitutional: Negative.   HENT: Positive for sinus pressure.   Eyes: Negative.   Respiratory: Negative.   Cardiovascular: Negative.   Gastrointestinal: Negative.   Neurological: Negative.   Hematological: Negative.   Psychiatric/Behavioral: Negative.        Objective:   Physical Exam Vitals - ok  Gen'l - thin , elderly white woman in no distress.  HEENT - [place]/AT, C&S clear, oropharnyx without lesions Neck- supple w/o thyromegaly Chest - mild kyphosis, no CVAT Lungs - CTAP Cor - frequent PVCs vs rate controlled a. Fib.  Abdomen - BS+, no guarding or rebound. Ext - interosseous wasting, no joint deformity, synovial thickening, erythema. No cog wheeling rigidity. Neuro - A&O x 3. No formal mental status testing.       Assessment & Plan:

## 2010-08-24 NOTE — Assessment & Plan Note (Signed)
Currently stable with symptoms in remission

## 2010-08-24 NOTE — Assessment & Plan Note (Signed)
BP Readings from Last 3 Encounters:  08/23/10 108/78  07/09/10 128/88  05/24/10 122/82   Good control on her present regimen

## 2010-08-25 DIAGNOSIS — M5106 Intervertebral disc disorders with myelopathy, lumbar region: Secondary | ICD-10-CM

## 2010-08-25 DIAGNOSIS — M48061 Spinal stenosis, lumbar region without neurogenic claudication: Secondary | ICD-10-CM

## 2010-08-25 DIAGNOSIS — R918 Other nonspecific abnormal finding of lung field: Secondary | ICD-10-CM

## 2010-08-25 DIAGNOSIS — I4891 Unspecified atrial fibrillation: Secondary | ICD-10-CM

## 2010-09-04 NOTE — Discharge Summary (Signed)
Laurie Larsen, MCCONATHY NO.:  [id number]  MEDICAL RECORD NO.:  [id number]  LOCATION:  3704                         FACILITY:  [facility]  PHYSICIAN:  Rosalyn Gess. Norins, MD  DATE OF BIRTH:  1923/04/21  DATE OF ADMISSION:  08/04/2010 DATE OF DISCHARGE:  08/06/2010                              DISCHARGE SUMMARY   ADMITTING DIAGNOSES: 1. Pulmonary infiltrate. 2. Atrial fibrillation. 3. Hip and back pain.  DISCHARGE DIAGNOSES: 1. Pulmonary infiltrate. 2. Atrial fibrillation. 3. Hip and back pain.  CONSULTANTS:  None.  PROCEDURES: 1. Chest x-ray on day of admission read out as showing new right lower     lobe infiltrate. 2. CT of the chest with contrast which showed subpleural left upper     lobe nodule unchanged from November 22, 2008.  Moderate right     pleural effusion with mild airspace disease in the adjacent right     lower lobe.  Tiny left pleural effusion with scattered pleural     parenchymal scarring.  Coronary artery calcification. 3. Lumbar spine series which showed no acute osseous abnormality.     Dextroconvex scoliosis is moderate with a rightward sublux of L3 on     L4, otherwise, unremarkable.  HISTORY OF PRESENT ILLNESS:  The patient is an 77 year old woman well known to the [employer] followed for history of CAD, hypertension, atrial fibrillation on Coumadin therapy, history of chronic left hip and right leg pain.  The patient presented to the emergency department on the day of admission complaining of an episode of near syncope while at church.  The patient was seen acquired and felt suddenly weak and dizzy, felt that her knees are going to giveaway and she quickly sat down in the next available chair.  She reports no loss of consciousness.  She was brought to the emergency department for evaluation.  As an incidental finding, she was found to have an infiltrate on chest x-ray. She had no neurologic findings.  The patient had no fever,  no white count, no cough, no sputum production.  Because of the infiltrate, she was admitted for potential pneumonia.  Please see the H and P for the details of her admission, past medical history, family history, and social history as well as physical exam.  HOSPITAL COURSE: 1. Pulmonary.  The patient remained afebrile.  Her white count never     was elevated, running in the 6.4 range at admission; on the night     it was still 6.4.  The patient had no cough.  She had no shortness     of breath.  She had no wheezing.  She had oxygen saturation of 93%     on room air.  The patient had been started on Rocephin and     azithromycin.  Rocephin was discontinued.  Azithromycin was     continued.  The patient's CT scan of the chest which did reveal     that she had a small effusion with a question of airspace disease     in the adjacent right lower lobe.  Of note, she had followup for a     pulmonary nodule which is unchanged.  With the patient having no     fever, no cough, no white count, no other evidence of infection or     pneumonia, her antibiotics were discontinued and she was ready for     discharge home. 2. Chronic atrial fibrillation.  The patient has remained stable. 3. CAD.  The patient with no chest pain, chest discomfort, or other     cardiac symptoms. 4. Ortho.  Patient with longstanding history of severe pain in her     left hip and pain in her right leg.  She did have bilateral hip     films in the office in the recent months, which was unremarkable     with no loss of joint space.  She had lumbar spine films during     this admission as noted.  The patient has seen Dr. Ethelene Hal in the     past and had undergone a course of physical therapy with no marked     improvement.  The patient is able to ambulate with the assistance     of her husband despite her pain and discomfort.  Plan, the patient     is ready for discharge home.  We will arrange for to have a     orthopedic  consult with her doctor of choice, Dr. Despina Hick, can be     scheduled. 5. Derm.  The patient has had a longstanding erythematous macular rash     on the buttock and left groin.  She has defied diagnosis by 2     dermatologists.  She has been using a cortisone cream with a     relatively moderate result.  She can continue this as an     outpatient.  With the patient having no evidence of pneumonia with her being hemodynamically and pulmonary stable, she is ready for discharge to home.  DISCHARGE EXAMINATION:  VITAL SIGNS:  Temperature was 98.3, blood pressure 146/84, pulse was 74, respirations 20, O2 sats 98% on room air. GENERAL APPEARANCE:  This is an elderly, thin, Caucasian woman in no acute distress. HEENT:  Mild temporal wasting.  Prominent facial features. NECK:  Supple. CHEST:  Patient has got no CVA tenderness.  Mild scoliosis as noted. LUNGS:  Patient is moving air well with no rales, wheezes, rhonchi, or other abnormal sounds findings. CARDIOVASCULAR:  A 2+ radial pulse.  Her precordium is quiet.  Her heart sounds are without murmurs or rubs.  She does have a rate-controlled irregularly irregular rhythm. ABDOMEN:  Nontender. EXTREMITIES:  Without swelling or edema. No further exam conducted.  FINAL LABORATORY:  Metabolic panel from August 05, 2010, with a sodium of 140, potassium 3.4, chloride 107, CO2 of 28, BUN 17, creatinine 0.49, glucose was 94, calcium was 8.5.  CBC from August 05, 2010, with a white count of 6400, hemoglobin 12.2 g, platelet count 168,000.  Differential is normal with 66% segs, 17% lymphs, 12% monos, 5% eosinophils.  Cardiac enzymes were negative x3.  DISPOSITION:  The patient is discharged to home.  Of note, the patient was seen by Physical Therapy, who thought the patient would benefit from having a rolling walker at home as well as having home health PT given her leg pain and difficulty with ambulation.  This will be ordered for the patient.  The  patient will also be scheduled as an outpatient to see Dr. Lequita Halt at her request.  She understands that there may be some delay before being able to schedule an appointment.  The patient's condition at the time of discharge dictation is medically stable with chronic pain problems as noted.     Rosalyn Gess Norins, MD     MEN/MEDQ  D:  08/06/2010  T:  08/06/2010  Job:  [id number]  Electronically Signed by Illene Regulus MD on 09/04/2010 04:02:40 PM

## 2010-09-12 ENCOUNTER — Telehealth: Payer: Self-pay | Admitting: Cardiology

## 2010-09-12 NOTE — Telephone Encounter (Signed)
Called stating she has had a rash on both arms, red and itching. Has had this problem multiple times. States she wanted to let us know that she only took 1/2 tablet of coumadin last pm because had some "bleeding under the skin". Advised she needs to go back to see Dermatologist. York Spaniel she was going to see him tomorrow but just wanted [place] to know about coumadin. Advised if she feels like she is "continuing to bleed under the skin" she probably should call and get coumadin checked earlier. Has tried all the meds that have been given to her over the years and nothing has really helped.

## 2010-09-12 NOTE — Telephone Encounter (Signed)
Called having a question about her coumadin. Please call back. I have pulled her chart.

## 2010-09-13 ENCOUNTER — Ambulatory Visit (INDEPENDENT_AMBULATORY_CARE_PROVIDER_SITE_OTHER): Payer: Medicare Other | Admitting: *Deleted

## 2010-09-13 DIAGNOSIS — I4892 Unspecified atrial flutter: Secondary | ICD-10-CM

## 2010-09-13 LAB — POCT INR: INR: 2.9

## 2010-09-19 ENCOUNTER — Encounter: Payer: Self-pay | Admitting: Cardiology

## 2010-09-19 ENCOUNTER — Ambulatory Visit (INDEPENDENT_AMBULATORY_CARE_PROVIDER_SITE_OTHER): Payer: Medicare Other | Admitting: Cardiology

## 2010-09-19 ENCOUNTER — Encounter: Payer: Medicare Other | Admitting: *Deleted

## 2010-09-19 DIAGNOSIS — I10 Essential (primary) hypertension: Secondary | ICD-10-CM

## 2010-09-19 DIAGNOSIS — I495 Sick sinus syndrome: Secondary | ICD-10-CM

## 2010-09-19 NOTE — Patient Instructions (Signed)
Continue your medications except stop taking ASA.  I will see you again in 6 months.

## 2010-09-20 NOTE — Assessment & Plan Note (Addendum)
She is status post DDD pacemaker implant. Her heart rate appears to be well controlled. She is on chronic anticoagulant therapy and is asymptomatic at this point. Since she is on Coumadin I have recommended that she stop taking aspirin at this point.

## 2010-09-20 NOTE — Progress Notes (Signed)
[name] Date of Birth: 21-Jan-1924   History of Present Illness: Mrs. Fusaro is seen today for followup of her atrial fibrillation. She states that she doesn't feel well. Her appetite has been poor especially in the morning. She feels weak. Last week she developed a rash on her right arm and in her whole forearm turn purple from her scratching it. She did have her INR checked and it was 2.9. She does report that she was hospitalized in July with pneumonia. Her breathing is doing well now and she denies any cough. She's had no increase in edema or orthopnea. She denies any chest pain.  Current Outpatient Prescriptions on File Prior to Visit  Medication Sig Dispense Refill  . Acetaminophen (TYLENOL EX ST ARTHRITIS PAIN PO) Take by mouth as needed. For Pain.       [name] aspirin 81 MG EC tablet Take 81 mg by mouth daily.        [name] atenolol (TENORMIN) 50 MG tablet Take 25 mg by mouth daily.        [name] atorvastatin (LIPITOR) 40 MG tablet Take 20 mg by mouth at bedtime.        [name] diltiazem (CARDIZEM) 120 MG tablet Take 120 mg by mouth daily.        . dorzolamide-timolol (COSOPT) 22.3-6.8 MG/ML ophthalmic solution 1 drop as directed.        [name] levothyroxine (SYNTHROID, LEVOTHROID) 125 MCG tablet Take 1 tablet (125 mcg total) by mouth daily. BRAND NAME SYNTHROID ONLY  90 tablet  3  . potassium chloride (KLOR-CON) 10 MEQ CR tablet Take 10 mEq by mouth daily.        . traMADol (ULTRAM) 50 MG tablet Take 50 mg by mouth as needed. For pain.       [name] travoprost, benzalkonium, (TRAVATAN) 0.004 % ophthalmic solution 1 drop as directed.        . warfarin (COUMADIN) 5 MG tablet Take 5 mg by mouth as directed. PER Dr Elvis [facility] Takes 7.5mg  on Wednesdays         Allergies  Allergen Reactions  . Digoxin     REACTION: weakness, dehydration  . Hydrocodone     REACTION: rash  . Risedronate Sodium     REACTION: rash    Past Medical History  Diagnosis Date  . Permanent atrial fibrillation   . HTN  (hypertension)   . Diastolic dysfunction   . CAD (coronary artery disease)     W/occlusion of distal circumflex branch 2005  . GERD (gastroesophageal reflux disease)   . Sick sinus syndrome     W/DDD pacemaker  . Chronic pain     ? due to arthritis  . Glaucoma   . Chronic anticoagulation   . Hyperlipidemia   . Diverticulitis large intestine      by history  . Allergy   . Phlebitis     after pacemeker placement  . Colon polyps   . Thyroid disease     hypothyroidism on medications  . Transfusion history     after childbirth 60 years ago (1957)  . CHF (congestive heart failure)     hospitalized 2010  . Rash/skin eruption     multiple episodes - wide distribution-intermittent, possibly drug rash.    Past Surgical History  Procedure Date  . Appendectomy   . Abdominal hysterectomy   . Tonsillectomy   . Kidney surgery     Right  . Ankle fracture surgery     Left,  pin  . Pacemaker insertion     Dr Reyes Ivan ([facility]) 5.25.2010  . Breast surgery     Needle guided excision, right breast calcification  . Breast fibroadenoma surgery     Right    History  Smoking status  . Never Smoker   Smokeless tobacco  . Never Used    History  Alcohol Use No    Family History  Problem Relation Age of Onset  . Cancer Mother     colon with mets  . Diabetes Mother   . Heart disease Father   . Hyperlipidemia Father   . Hypertension Father   . Stroke Brother     brother who died 06/16/22  . Cancer Paternal Aunt     breast  . Cancer Cousin     uterine, lung cancer    Review of Systems: As noted in history of present illness. All other systems were reviewed and are negative.  Physical Exam: BP 150/90  Pulse 88  Ht 5' 1.5" (1.562 m)  Wt 111 lb 3.2 oz (50.44 kg)  BMI 20.67 kg/m2 She is an elderly white female who appears in good health. Her HEENT exam is unremarkable. She is normocephalic, atraumatic. Pupils are equal round and reactive. Sclera clear. Oropharynx is clear. Neck is  without JVD or bruits. Lungs are clear. Cardiac exam reveals a regular rate and rhythm without gallop, murmur, or click. Abdomen is soft and nontender. She has no masses. She has only trace ankle edema. LABORATORY DATA: Laboratory data from the hospital in July were reviewed.  Assessment / Plan:

## 2010-09-20 NOTE — Assessment & Plan Note (Signed)
Blood pressure is mildly elevated today but has been normal at home. We'll continue to monitor for now.

## 2010-09-22 ENCOUNTER — Inpatient Hospital Stay (INDEPENDENT_AMBULATORY_CARE_PROVIDER_SITE_OTHER)
Admission: RE | Admit: 2010-09-22 | Discharge: 2010-09-22 | Disposition: A | Discharge status: Home / Self Care | Payer: Medicare Other | Source: Ambulatory Visit | Attending: Family Medicine | Admitting: Family Medicine

## 2010-09-22 DIAGNOSIS — T148XXA Other injury of unspecified body region, initial encounter: Secondary | ICD-10-CM

## 2010-09-26 ENCOUNTER — Encounter: Payer: Medicare Other | Admitting: *Deleted

## 2010-09-27 ENCOUNTER — Other Ambulatory Visit: Payer: Self-pay

## 2010-09-27 ENCOUNTER — Ambulatory Visit (INDEPENDENT_AMBULATORY_CARE_PROVIDER_SITE_OTHER): Payer: Medicare Other | Admitting: *Deleted

## 2010-09-27 ENCOUNTER — Telehealth: Payer: Self-pay | Admitting: *Deleted

## 2010-09-27 DIAGNOSIS — I4891 Unspecified atrial fibrillation: Secondary | ICD-10-CM

## 2010-09-27 DIAGNOSIS — I495 Sick sinus syndrome: Secondary | ICD-10-CM

## 2010-09-27 NOTE — Telephone Encounter (Signed)
[facility] from [facility] called stating Ms. Happe was there to have coumadin checked and was c/o SOB. They checked her pacer and is in chronic AFIB with rate control of 60-80. Mrs. Barbato states she is taking Lasix 80 mg daily but on Wed she took extra dose and another day she took 1 1/2 dose. She did not have that on her med list. Per Dr. [place] that is a large dose and she should stay on that. Has dystolic dysfunction. Advised to continue the 80 mg dose over the week-end and if not any better by Tues.to call Dr. Debby Bud or can go to ER over week-end.

## 2010-10-01 ENCOUNTER — Encounter: Payer: Self-pay | Admitting: Internal Medicine

## 2010-10-01 ENCOUNTER — Ambulatory Visit (INDEPENDENT_AMBULATORY_CARE_PROVIDER_SITE_OTHER)
Admission: RE | Admit: 2010-10-01 | Discharge: 2010-10-01 | Disposition: A | Discharge status: Home / Self Care | Payer: Medicare Other | Source: Ambulatory Visit | Attending: Internal Medicine | Admitting: Internal Medicine

## 2010-10-01 ENCOUNTER — Ambulatory Visit (INDEPENDENT_AMBULATORY_CARE_PROVIDER_SITE_OTHER): Payer: Medicare Other | Admitting: Internal Medicine

## 2010-10-01 ENCOUNTER — Other Ambulatory Visit (INDEPENDENT_AMBULATORY_CARE_PROVIDER_SITE_OTHER): Payer: Medicare Other

## 2010-10-01 VITALS — BP 140/82 | HR 84 | Temp 97.5°F | Ht 64.0 in | Wt 108.4 lb

## 2010-10-01 DIAGNOSIS — R1011 Right upper quadrant pain: Secondary | ICD-10-CM

## 2010-10-01 DIAGNOSIS — R0600 Dyspnea, unspecified: Secondary | ICD-10-CM

## 2010-10-01 DIAGNOSIS — I4891 Unspecified atrial fibrillation: Secondary | ICD-10-CM

## 2010-10-01 DIAGNOSIS — J90 Pleural effusion, not elsewhere classified: Secondary | ICD-10-CM | POA: Insufficient documentation

## 2010-10-01 DIAGNOSIS — R0609 Other forms of dyspnea: Secondary | ICD-10-CM

## 2010-10-01 DIAGNOSIS — R0989 Other specified symptoms and signs involving the circulatory and respiratory systems: Secondary | ICD-10-CM

## 2010-10-01 LAB — CBC WITH DIFFERENTIAL/PLATELET
Basophils Absolute: 0.1 10*3/uL (ref 0.0–0.1)
Eosinophils Absolute: 0.2 10*3/uL (ref 0.0–0.7)
HCT: 36.3 % (ref 36.0–46.0)
Hemoglobin: 12.2 g/dL (ref 12.0–15.0)
Lymphocytes Relative: 12.3 % (ref 12.0–46.0)
Lymphs Abs: 0.9 10*3/uL (ref 0.7–4.0)
MCHC: 33.6 g/dL (ref 30.0–36.0)
Monocytes Relative: 11.1 % (ref 3.0–12.0)
Neutro Abs: 5.3 10*3/uL (ref 1.4–7.7)
Platelets: 221 10*3/uL (ref 150.0–400.0)
RDW: 13.6 % (ref 11.5–14.6)

## 2010-10-01 LAB — HEPATIC FUNCTION PANEL
AST: 23 U/L (ref 0–37)
Albumin: 3.5 g/dL (ref 3.5–5.2)
Alkaline Phosphatase: 66 U/L (ref 39–117)
Bilirubin, Direct: 0.1 mg/dL (ref 0.0–0.3)
Total Protein: 6.2 g/dL (ref 6.0–8.3)

## 2010-10-01 LAB — BASIC METABOLIC PANEL
CO2: 28 mEq/L (ref 19–32)
Calcium: 8.9 mg/dL (ref 8.4–10.5)
Glucose, Bld: 90 mg/dL (ref 70–99)
Potassium: 3.9 mEq/L (ref 3.5–5.1)
Sodium: 137 mEq/L (ref 135–145)

## 2010-10-01 LAB — URINALYSIS, ROUTINE W REFLEX MICROSCOPIC
Hgb urine dipstick: NEGATIVE
Leukocytes, UA: NEGATIVE
Nitrite: NEGATIVE
Urine Glucose: NEGATIVE
Urobilinogen, UA: 0.2 (ref 0.0–1.0)

## 2010-10-01 LAB — LIPASE: Lipase: 35 U/L (ref 11.0–59.0)

## 2010-10-01 NOTE — Assessment & Plan Note (Signed)
O/w stable rate and volume by exam, cont all meds

## 2010-10-01 NOTE — Assessment & Plan Note (Signed)
Suspect possible acute cholecystitis; no n/v and volume if anything could be mild overloaded (see above);  Tramadol ok for pain per pt;  Currently afeb;  Will get stat U/S , and labs today, hold antibx; may need surgury vs GI referral

## 2010-10-01 NOTE — Patient Instructions (Addendum)
Your EKG was OK today Please see the [facility] today before leaving to schedule abdomen ultrasound Please go to LAB in the Basement for the blood and/or urine tests to be done today Please go to XRAY in the Basement for the x-ray test Please call the phone number [phone] (the PhoneTree System) for results of testing in 2-3 days;  When calling, simply dial the number, and when prompted enter the MRN number above (the Medical Record Number) and the # key, then the message should start. Depending on the results, you may need to be referred to Gastroenterology (GI), or General Surgury We can hold on the referral back to Dr [place] or Echocardiogram for now Please increase the Lasix to twice per day for 2 days only, then back to once in the AM There are no other new medications at this time Continue all other medications as before

## 2010-10-01 NOTE — Progress Notes (Signed)
Subjective:    Patient ID: Laurie Larsen, female    DOB: October 29, 1923, 77 y.o.   MRN: [id number]  HPI  Here ostensibly with sob, but  Pt denies fever, wt loss, night sweats,  or other constitutional symptoms, and Pt denies chest pain,  wheezing, orthopnea, PND, increased LE swelling, palpitations, dizziness or syncope, but does have signficant abd pain/ruq with radiation to the right flank area, with nausea, decreased appetite, but Denies worsening reflux, dysphagia,  vomiting, bowel change or blood.  Still has GB.  Tramadol works ok enough for the pain.  No other back pain/spine pain Past Medical History  Diagnosis Date  . Permanent atrial fibrillation   . HTN (hypertension)   . Diastolic dysfunction   . CAD (coronary artery disease)     W/occlusion of distal circumflex branch 2005  . GERD (gastroesophageal reflux disease)   . Sick sinus syndrome     W/DDD pacemaker  . Chronic pain     ? due to arthritis  . Glaucoma   . Chronic anticoagulation   . Hyperlipidemia   . Diverticulitis large intestine      by history  . Allergy   . Phlebitis     after pacemeker placement  . Colon polyps   . Thyroid disease     hypothyroidism on medications  . Transfusion history     after childbirth 60 years ago (1957)  . CHF (congestive heart failure)     hospitalized 2010  . Rash/skin eruption     multiple episodes - wide distribution-intermittent, possibly drug rash.   Past Surgical History  Procedure Date  . Appendectomy   . Abdominal hysterectomy   . Tonsillectomy   . Kidney surgery     Right  . Ankle fracture surgery     Left, pin  . Pacemaker insertion     Dr Reyes Ivan ([facility]) 5.25.2010  . Breast surgery     Needle guided excision, right breast calcification  . Breast fibroadenoma surgery     Right    reports that she has never smoked. She has never used smokeless tobacco. She reports that she does not drink alcohol. Her drug history not on file. family history includes Cancer in  her cousin, mother, and paternal aunt; Diabetes in her mother; Heart disease in her father; Hyperlipidemia in her father; Hypertension in her father; and Stroke in her brother. Allergies  Allergen Reactions  . Digoxin     REACTION: weakness, dehydration  . Hydrocodone     REACTION: rash  . Risedronate Sodium     REACTION: rash   Current Outpatient Prescriptions on File Prior to Visit  Medication Sig Dispense Refill  . Acetaminophen (TYLENOL EX ST ARTHRITIS PAIN PO) Take by mouth as needed. For Pain.       [name] atenolol (TENORMIN) 50 MG tablet Take 25 mg by mouth daily.        [name] atorvastatin (LIPITOR) 40 MG tablet Take 20 mg by mouth at bedtime.        [name] diltiazem (CARDIZEM) 120 MG tablet Take 120 mg by mouth daily.        . dorzolamide-timolol (COSOPT) 22.3-6.8 MG/ML ophthalmic solution 1 drop as directed.        . furosemide (LASIX) 80 MG tablet Take 80 mg by mouth daily.        . hydrOXYzine (ATARAX) 10 MG tablet Ad lib.      [name] levothyroxine (SYNTHROID, LEVOTHROID) 125 MCG tablet Take 1 tablet (125 mcg total)  by mouth daily. BRAND NAME SYNTHROID ONLY  90 tablet  3  . potassium chloride (KLOR-CON) 10 MEQ CR tablet Take 10 mEq by mouth daily.        . traMADol (ULTRAM) 50 MG tablet Take 50 mg by mouth as needed. For pain.       [name] travoprost, benzalkonium, (TRAVATAN) 0.004 % ophthalmic solution 1 drop as directed.        . warfarin (COUMADIN) 5 MG tablet Take 5 mg by mouth as directed. PER Dr Elvis Coil [facility] Takes 7.5mg  on Wednesdays       . aspirin 81 MG EC tablet Take 81 mg by mouth daily.         Review of Systems Review of Systems  Constitutional: Negative for diaphoresis and unexpected weight change.  HENT: Negative for drooling and tinnitus.   Eyes: Negative for photophobia and visual disturbance.  Respiratory: Negative for choking and stridor.   Gastrointestinal: Negative for vomiting and blood in stool.  Genitourinary: Negative for hematuria and decreased urine volume.    Musculoskeletal: Negative for gait problem.       Objective:   Physical Exam BP 140/82  Pulse 84  Temp(Src) 97.5 F (36.4 C) (Oral)  Ht 5\' 4"  (1.626 m)  Wt 108 lb 6 oz (49.159 kg)  BMI 18.60 kg/m2  SpO2 99% Physical Exam  VS noted, in pain Constitutional: Pt appears well-developed and well-nourished.  HENT: Head: Normocephalic.  Right Ear: External ear normal.  Left Ear: External ear normal.  Eyes: Conjunctivae and EOM are normal. Pupils are equal, round, and reactive to light.  Neck: Normal range of motion. Neck supple.  Cardiovascular: Normal rate and irregular rhythm.   Pulmonary/Chest: Effort normal and breath sounds decreased but no rales/wheeze.  Abd:  Soft, NT, non-distended, + BS Neurological: Pt is alert. No cranial nerve deficit. motor/gait intact Skin: Skin is warm. No erythema. No rash Psychiatric: Pt behavior is normal. Thought content normal. 1+ nervous        Assessment & Plan:

## 2010-10-01 NOTE — Progress Notes (Signed)
Quick Note:  Voice message left on PhoneTree system - lab is negative, normal or otherwise stable, pt to continue same tx ______ 

## 2010-10-01 NOTE — Assessment & Plan Note (Addendum)
I suspect secondary to pain/ruq process but did have hx of mild orthopnea last night - for lasix increased to bid for 2 days only , as well as CXR today with recent pna but exam o/w benign, o2 sat normal; doubt PE; ecg reviewed - afib, rate OK, no acute changes

## 2010-10-02 ENCOUNTER — Encounter: Payer: Self-pay | Admitting: Internal Medicine

## 2010-10-02 ENCOUNTER — Telehealth: Payer: Self-pay

## 2010-10-02 ENCOUNTER — Ambulatory Visit
Admission: RE | Admit: 2010-10-02 | Discharge: 2010-10-02 | Disposition: A | Discharge status: Home / Self Care | Payer: Medicare Other | Source: Ambulatory Visit | Attending: Internal Medicine | Admitting: Internal Medicine

## 2010-10-02 ENCOUNTER — Ambulatory Visit (INDEPENDENT_AMBULATORY_CARE_PROVIDER_SITE_OTHER): Payer: Medicare Other | Admitting: Internal Medicine

## 2010-10-02 VITALS — BP 102/60 | HR 93 | Temp 97.9°F | Ht 64.0 in

## 2010-10-02 DIAGNOSIS — IMO0002 Reserved for concepts with insufficient information to code with codable children: Secondary | ICD-10-CM

## 2010-10-02 DIAGNOSIS — R1011 Right upper quadrant pain: Secondary | ICD-10-CM

## 2010-10-02 DIAGNOSIS — L03114 Cellulitis of left upper limb: Secondary | ICD-10-CM

## 2010-10-02 DIAGNOSIS — S41119A Laceration without foreign body of unspecified upper arm, initial encounter: Secondary | ICD-10-CM

## 2010-10-02 DIAGNOSIS — S41109A Unspecified open wound of unspecified upper arm, initial encounter: Secondary | ICD-10-CM

## 2010-10-02 DIAGNOSIS — R109 Unspecified abdominal pain: Secondary | ICD-10-CM

## 2010-10-02 LAB — PACEMAKER DEVICE OBSERVATION
AL IMPEDENCE PM: 560 Ohm
ATRIAL PACING PM: 0.18
BAMS-0001: 170 {beats}/min
VENTRICULAR PACING PM: 48.27

## 2010-10-02 MED ORDER — AMOXICILLIN 500 MG PO CAPS: 500.0000 mg | ORAL_CAPSULE | Freq: Three times a day (TID) | ORAL | Status: AC

## 2010-10-02 NOTE — Progress Notes (Signed)
  Subjective:    Patient ID: Laurie Larsen, female    DOB: 06-18-1923, 77 y.o.   MRN: [id number]  HPI  Here for L arm laceration -  event occurred 10 days ago - precipitated by accidental abrasion/trauma eval at urg care at time of injury - wound care advice provided, no antibiotics  Has kept covered since that time In last 24h, increasing redness at edge of wound with swelling and increase pain- no drainage, odor or fever  Past Medical History  Diagnosis Date  . Permanent atrial fibrillation   . HTN (hypertension)   . Diastolic dysfunction   . CAD (coronary artery disease)     W/occlusion of distal circumflex branch 2005  . GERD (gastroesophageal reflux disease)   . Sick sinus syndrome     W/DDD pacemaker  . Chronic pain     ? due to arthritis  . Glaucoma   . Chronic anticoagulation   . Hyperlipidemia   . Diverticulitis large intestine      by history  . Allergy   . Phlebitis     after pacemeker placement  . Colon polyps   . Thyroid disease     hypothyroidism on medications  . Transfusion history     after childbirth 60 years ago (1957)  . CHF (congestive heart failure)     hospitalized 2010  . Rash/skin eruption     multiple episodes - wide distribution-intermittent, possibly drug rash.     Review of Systems  Respiratory: Negative for cough and wheezing.   Neurological: Negative for headaches.       Objective:   Physical Exam BP 102/60  Pulse 93  Temp(Src) 97.9 F (36.6 C) (Oral)  Ht 5\' 4"  (1.626 m)  SpO2 98% Constitutional: She appears well-developed and well-nourished. No distress. spouse at side Cardiovascular: Normal rate, regular rhythm and normal heart sounds.  No murmur heard. No BLE edema. Pulmonary/Chest: Effort normal and breath sounds normal. No respiratory distress. She has no wheezes.  Abdominal: Soft. Bowel sounds are normal. She exhibits no distension. There is no tenderness. no masses  Skin: LUE forearm with superficial  laceration/abrasion over extensor surface distal 1/3 of arm - irregular 2.5x4.5 cm - good granulation but erythema at edges with early cellutlits and edema - remaining skin is warm and dry. No rash noted.  Psychiatric: She has a normal mood and affect. Her behavior is normal. Judgment and thought content normal.        Assessment & Plan:  LUE forearm laceration/abrasion with early cellulitis - amox tid x 10d - erx done - reviewed wound care instructions including soak/wash warm soapy water 2x/day before dressing change  Abd pain - [place] 9/4 reviewed - no explanation for symptoms which have since resolved - follow up PCP as needed

## 2010-10-02 NOTE — Telephone Encounter (Signed)
[facility]) called with stat report on abdominal U/S. Limited visualization of pancreas, normal appearance of Liver, spleen and both kidneys. Normal gall bladder and common bile duct/right plueral effusion. They let the patient go home.

## 2010-10-02 NOTE — Patient Instructions (Signed)
It was good to see you today. Amoxicillin antibiotics - Your prescription(s) have been submitted to your pharmacy. Please take as directed and contact our office if you believe you are having problem(s) with the medication(s). Keep wound washed/clean 2x/day with warm soapy water as discussed and then cover

## 2010-10-04 NOTE — Progress Notes (Signed)
Pacer check in clinic  

## 2010-10-08 ENCOUNTER — Encounter: Payer: Self-pay | Admitting: Internal Medicine

## 2010-10-08 ENCOUNTER — Ambulatory Visit (HOSPITAL_COMMUNITY): Payer: Medicare Other | Admitting: Internal Medicine

## 2010-10-08 ENCOUNTER — Observation Stay (HOSPITAL_COMMUNITY): Payer: Medicare Other

## 2010-10-08 ENCOUNTER — Observation Stay (HOSPITAL_COMMUNITY)
Admission: AD | Admit: 2010-10-08 | Discharge: 2010-10-10 | Disposition: A | Discharge status: Home / Self Care | Payer: Medicare Other | Source: Ambulatory Visit | Attending: Internal Medicine | Admitting: Internal Medicine

## 2010-10-08 DIAGNOSIS — I509 Heart failure, unspecified: Secondary | ICD-10-CM | POA: Insufficient documentation

## 2010-10-08 DIAGNOSIS — I4891 Unspecified atrial fibrillation: Secondary | ICD-10-CM | POA: Insufficient documentation

## 2010-10-08 DIAGNOSIS — J9819 Other pulmonary collapse: Secondary | ICD-10-CM | POA: Insufficient documentation

## 2010-10-08 DIAGNOSIS — Z79899 Other long term (current) drug therapy: Secondary | ICD-10-CM | POA: Insufficient documentation

## 2010-10-08 DIAGNOSIS — M5137 Other intervertebral disc degeneration, lumbosacral region: Secondary | ICD-10-CM | POA: Insufficient documentation

## 2010-10-08 DIAGNOSIS — R0600 Dyspnea, unspecified: Secondary | ICD-10-CM

## 2010-10-08 DIAGNOSIS — R0989 Other specified symptoms and signs involving the circulatory and respiratory systems: Secondary | ICD-10-CM

## 2010-10-08 DIAGNOSIS — R1011 Right upper quadrant pain: Secondary | ICD-10-CM

## 2010-10-08 DIAGNOSIS — E039 Hypothyroidism, unspecified: Secondary | ICD-10-CM

## 2010-10-08 DIAGNOSIS — R109 Unspecified abdominal pain: Principal | ICD-10-CM | POA: Insufficient documentation

## 2010-10-08 DIAGNOSIS — R0602 Shortness of breath: Secondary | ICD-10-CM | POA: Insufficient documentation

## 2010-10-08 DIAGNOSIS — I2510 Atherosclerotic heart disease of native coronary artery without angina pectoris: Secondary | ICD-10-CM | POA: Insufficient documentation

## 2010-10-08 DIAGNOSIS — S40029A Contusion of unspecified upper arm, initial encounter: Secondary | ICD-10-CM | POA: Insufficient documentation

## 2010-10-08 DIAGNOSIS — X58XXXA Exposure to other specified factors, initial encounter: Secondary | ICD-10-CM | POA: Insufficient documentation

## 2010-10-08 DIAGNOSIS — I700 Atherosclerosis of aorta: Secondary | ICD-10-CM | POA: Insufficient documentation

## 2010-10-08 DIAGNOSIS — Z950 Presence of cardiac pacemaker: Secondary | ICD-10-CM | POA: Insufficient documentation

## 2010-10-08 DIAGNOSIS — K219 Gastro-esophageal reflux disease without esophagitis: Secondary | ICD-10-CM | POA: Insufficient documentation

## 2010-10-08 DIAGNOSIS — M51379 Other intervertebral disc degeneration, lumbosacral region without mention of lumbar back pain or lower extremity pain: Secondary | ICD-10-CM | POA: Insufficient documentation

## 2010-10-08 DIAGNOSIS — R0609 Other forms of dyspnea: Secondary | ICD-10-CM | POA: Insufficient documentation

## 2010-10-08 DIAGNOSIS — Z90710 Acquired absence of both cervix and uterus: Secondary | ICD-10-CM | POA: Insufficient documentation

## 2010-10-08 DIAGNOSIS — J90 Pleural effusion, not elsewhere classified: Secondary | ICD-10-CM | POA: Insufficient documentation

## 2010-10-08 DIAGNOSIS — I10 Essential (primary) hypertension: Secondary | ICD-10-CM | POA: Insufficient documentation

## 2010-10-08 DIAGNOSIS — Z9089 Acquired absence of other organs: Secondary | ICD-10-CM | POA: Insufficient documentation

## 2010-10-08 DIAGNOSIS — I079 Rheumatic tricuspid valve disease, unspecified: Secondary | ICD-10-CM | POA: Insufficient documentation

## 2010-10-08 LAB — CBC
HCT: 35.2 % — ABNORMAL LOW (ref 36.0–46.0)
Hemoglobin: 11.9 g/dL — ABNORMAL LOW (ref 12.0–15.0)
WBC: 6.6 10*3/uL (ref 4.0–10.5)

## 2010-10-08 LAB — PROTIME-INR
INR: 2.45 — ABNORMAL HIGH (ref 0.00–1.49)
Prothrombin Time: 27 seconds — ABNORMAL HIGH (ref 11.6–15.2)

## 2010-10-08 LAB — HEPATIC FUNCTION PANEL
Albumin: 3.1 g/dL — ABNORMAL LOW (ref 3.5–5.2)
Alkaline Phosphatase: 68 U/L (ref 39–117)
Indirect Bilirubin: 0.1 mg/dL — ABNORMAL LOW (ref 0.3–0.9)
Total Bilirubin: 0.2 mg/dL — ABNORMAL LOW (ref 0.3–1.2)
Total Protein: 6.1 g/dL (ref 6.0–8.3)

## 2010-10-08 LAB — BASIC METABOLIC PANEL
CO2: 28 mEq/L (ref 19–32)
Calcium: 8.7 mg/dL (ref 8.4–10.5)
GFR calc non Af Amer: >60 mL/min (ref >60)
Glucose, Bld: 96 mg/dL (ref 70–99)
Potassium: 3.2 mEq/L — ABNORMAL LOW (ref 3.5–5.1)
Sodium: 135 mEq/L (ref 135–145)

## 2010-10-08 LAB — DIFFERENTIAL
Basophils Absolute: 0.1 10*3/uL (ref 0.0–0.1)
Basophils Relative: 1 % (ref 0–1)
Lymphocytes Relative: 16 % (ref 12–46)
Neutro Abs: 4.2 10*3/uL (ref 1.7–7.7)
Neutrophils Relative %: 64 % (ref 43–77)

## 2010-10-08 LAB — CARDIAC PANEL(CRET KIN+CKTOT+MB+TROPI)
CK, MB: 2 ng/mL (ref 0.3–4.0)
Total CK: 43 U/L (ref 7–177)
Troponin I: <0.3 ng/mL (ref <0.30)

## 2010-10-08 LAB — PRO B NATRIURETIC PEPTIDE: Pro B Natriuretic peptide (BNP): 1614 pg/mL — ABNORMAL HIGH (ref 0–450)

## 2010-10-08 NOTE — Assessment & Plan Note (Addendum)
  Appears rate controlled. No c/p.  Plan - continue home medications

## 2010-10-08 NOTE — Progress Notes (Signed)
Subjective:    Patient ID: Laurie Larsen, female    DOB: 1923/02/04, 77 y.o.   MRN: [id number]  HPI Laurie Larsen was seen for a skin tear left forearm 06-13-2022, Sept 2nd at Urgent Care with subsequent early cellulitis. She was seen by Dr. Felicity Coyer Sept 5th and was treated with amoxicillin. She developed pruritis but no frank rash. This has continued to itch.  She was seen Sept 4th by Dr. Jonny Ruiz for shortness of breath and for right flank pain. Evaluation included normal CBCD, Bmet. She had 12 lead EKG with A. Fib with rate control, CXR with small right pleural effusion and atelectasis but no infiltrate, no edema; Abd U/S with normal gallbladder and kidneys. She has continue to have intermittent flank pain. She did double her lasix for two days for possible fluid overload.   She returns today for increased weakness, increased SOB, continue itching, on-going flank pain.  Past Medical History  Diagnosis Date  . Permanent atrial fibrillation   . HTN (hypertension)   . Diastolic dysfunction   . CAD (coronary artery disease)     W/occlusion of distal circumflex branch 06/13/2003  . GERD (gastroesophageal reflux disease)   . Sick sinus syndrome     W/DDD pacemaker  . Chronic pain     ? due to arthritis  . Glaucoma   . Chronic anticoagulation   . Hyperlipidemia   . Diverticulitis large intestine      by history  . Allergy   . Phlebitis     after pacemeker placement  . Colon polyps   . Thyroid disease     hypothyroidism on medications  . Transfusion history     after childbirth 60 years ago 1953/06/13)  . CHF (congestive heart failure)     hospitalized 12-Jun-2008  . Rash/skin eruption     multiple episodes - wide distribution-intermittent, possibly drug rash.   Past Surgical History  Procedure Date  . Appendectomy   . Abdominal hysterectomy   . Tonsillectomy   . Kidney surgery     Right  . Ankle fracture surgery     Left, pin  . Pacemaker insertion     Dr Reyes Ivan (MDT) 5.25.2010  . Breast  surgery     Needle guided excision, right breast calcification  . Breast fibroadenoma surgery     Right   Family History  Problem Relation Age of Onset  . Cancer Mother     colon with mets  . Diabetes Mother   . Heart disease Father   . Hyperlipidemia Father   . Hypertension Father   . Stroke Brother     brother who died Jun 12, 2022  . Cancer Paternal Aunt     breast  . Cancer Cousin     uterine, lung cancer   History   Social History  . Marital Status: Married    Spouse Name: N/A    Number of Children: 3  . Years of Education: 13   Occupational History  . RETIRED    Social History Main Topics  . Smoking status: Never Smoker   . Smokeless tobacco: Never Used  . Alcohol Use: No  . Drug Use: Not on file  . Sexually Active: No   Other Topics Concern  . 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  Married 1948. 3 sons - 06/13/2050, 2055-06-13, 2056/06/12; 5 grandchildren-one deceased -  OD @  21.  Lives alone with husband and they are independent in ADLs.  End of Life Care: no prolonged heroic measures, i.e. Artificial feeding or hydration; DNR; no prolonged intubation.       Review of Systems Review of Systems  Constitutional:  Negative for fever, chills. Decreased activity. No  unexpected weight change.  HEENT:  Negative for hearing loss, ear pain, congestion, neck stiffness and postnasal drip. Negative for sore throat or swallowing problems. Negative for dental complaints.   Eyes: Negative for vision loss or change in visual acuity.  Respiratory: Positive for chest tightness, no wheezing.   Cardiovascular: Negative for chest pain and palpitation. decreased exercise tolerance Gastrointestinal: No change in bowel habit. No bloating or gas. No reflux or indigestion Genitourinary: Negative for urgency, frequency, and difficulty urinating. Positive for flank pain. Musculoskeletal: Negative for  myalgias, arthralgias and gait problem. Having a lot of back pain Neurological: Negative for dizziness, tremors, weakness and headaches.  Hematological: Negative for adenopathy.  Psychiatric/Behavioral: Negative for behavioral problems and dysphoric mood.       Objective:   Physical Exam Vitals- 97.7,  114/68,  HR  81, O2 sat 98%, wt 108 Gen'l - elderly white woman who appears ill but not in acute distress HEENT- Anderson/AT, no sign of trauma, C&S clear Neck- supple Chest - mild kyphosis, very thin, no CVAT, positive for tenderness at the right flank Cor - 2+ radial pulse, no JVD in the sitting position.Heart sounds distant, IRIR, II/VI systolic murmur best at RSB Abdomen- BS+, soft, very tender in the ruq. No HSM Pelvic/Rectal - deferred Ext - no deformity but very thin, interosseous wasting is noted. Derm - diffuse bruising both arm. Skin tear left forearm with granulation tissue. Neuro - A&O x 3, CN II-XII grossly intact.   Lab Results  Component Value Date   WBC 7.2 10/01/2010   HGB 12.2 10/01/2010   HCT 36.3 10/01/2010   PLT 221.0 10/01/2010   ALT 16 10/01/2010   AST 23 10/01/2010   NA 137 10/01/2010   K 3.9 10/01/2010   CL 101 10/01/2010   CREATININE 0.6 10/01/2010   BUN 17 10/01/2010   CO2 28 10/01/2010   TSH 0.87 07/09/2010   INR 3.3 09/27/2010   Current Outpatient Prescriptions  Medication Sig Dispense Refill  . Acetaminophen (TYLENOL EX ST ARTHRITIS PAIN PO) Take by mouth as needed. For Pain.       [name] amoxicillin (AMOXIL) 500 MG capsule Take 1 capsule (500 mg total) by mouth 3 (three) times daily.  30 capsule  0  . aspirin 81 MG EC tablet Take 81 mg by mouth daily.        [name] atenolol (TENORMIN) 50 MG tablet Take 25 mg by mouth daily.        [name] atorvastatin (LIPITOR) 40 MG tablet Take 20 mg by mouth at bedtime.        [name] diltiazem (CARDIZEM) 120 MG tablet Take 120 mg by mouth daily.        . dorzolamide-timolol (COSOPT) 22.3-6.8 MG/ML ophthalmic solution 1 drop as directed.        . furosemide  (LASIX) 80 MG tablet Take 80 mg by mouth daily.        . hydrOXYzine (ATARAX) 10 MG tablet Ad lib.      [name] KLOR-CON M10 10 MEQ tablet Take 1 by mouth daily      . levothyroxine (SYNTHROID, LEVOTHROID) 125 MCG tablet Take 1 tablet (125 mcg total) by mouth daily. BRAND  NAME SYNTHROID ONLY  90 tablet  3  . potassium chloride (KLOR-CON) 10 MEQ CR tablet Take 10 mEq by mouth daily.        . traMADol (ULTRAM) 50 MG tablet Take 50 mg by mouth as needed. For pain.       [name] travoprost, benzalkonium, (TRAVATAN) 0.004 % ophthalmic solution 1 drop as directed.        . warfarin (COUMADIN) 5 MG tablet Take 5 mg by mouth as directed. PER Dr Elvis Coil [facility] Takes 7.5mg  on Wednesdays               Assessment & Plan:

## 2010-10-08 NOTE — Assessment & Plan Note (Signed)
Will check routine labs, continue home medications.

## 2010-10-08 NOTE — Assessment & Plan Note (Signed)
Patinet with a h/o CHF now with increased SOB and weakness. Lungs without frank rales. Concern for active pulmonary edema  Plan - cyclic enzymes           2 D echo           Daily weights           Continue home medications.            BMet and if prerenal will gently hydrate but will wait on lab results.

## 2010-10-08 NOTE — Assessment & Plan Note (Signed)
Patient with persistent pain in the right flank with radiation to the RUQ abdomen. U/s negative for GB disease. Concern for possible kidney stone or other mass.   Plan - CT abdomen and pelvis to r/o mass or kidney stone           U/A- for microscopic blood or infection.

## 2010-10-09 ENCOUNTER — Observation Stay (HOSPITAL_COMMUNITY): Payer: Medicare Other

## 2010-10-09 DIAGNOSIS — R109 Unspecified abdominal pain: Secondary | ICD-10-CM

## 2010-10-09 DIAGNOSIS — R0609 Other forms of dyspnea: Secondary | ICD-10-CM

## 2010-10-09 DIAGNOSIS — R0989 Other specified symptoms and signs involving the circulatory and respiratory systems: Secondary | ICD-10-CM

## 2010-10-09 LAB — URINALYSIS, ROUTINE W REFLEX MICROSCOPIC
Bilirubin Urine: NEGATIVE
Hgb urine dipstick: NEGATIVE
Nitrite: NEGATIVE
Protein, ur: NEGATIVE mg/dL
Specific Gravity, Urine: 1.013 (ref 1.005–1.030)
Urobilinogen, UA: 0.2 mg/dL (ref 0.0–1.0)

## 2010-10-09 LAB — CARDIAC PANEL(CRET KIN+CKTOT+MB+TROPI)
CK, MB: 1.9 ng/mL (ref 0.3–4.0)
Relative Index: INVALID (ref 0.0–2.5)
Relative Index: INVALID (ref 0.0–2.5)
Total CK: 42 U/L (ref 7–177)
Troponin I: <0.3 ng/mL (ref <0.30)
Troponin I: <0.3 ng/mL (ref <0.30)

## 2010-10-10 DIAGNOSIS — I369 Nonrheumatic tricuspid valve disorder, unspecified: Secondary | ICD-10-CM

## 2010-10-11 ENCOUNTER — Ambulatory Visit (INDEPENDENT_AMBULATORY_CARE_PROVIDER_SITE_OTHER): Payer: Medicare Other | Admitting: *Deleted

## 2010-10-11 DIAGNOSIS — I4892 Unspecified atrial flutter: Secondary | ICD-10-CM

## 2010-10-13 NOTE — Discharge Summary (Signed)
Laurie Larsen, CHAM NO.:  [id number]  MEDICAL RECORD NO.:  [id number]  LOCATION:  1417                         FACILITY:  [facility]  PHYSICIAN:  Rosalyn Gess. Norins, MD  DATE OF BIRTH:  [birth year]  DATE OF ADMISSION:  10/08/2010 DATE OF DISCHARGE:  10/10/2010                              DISCHARGE SUMMARY   ADMITTING DIAGNOSES: 1. Severe flank pain. 2. Dyspnea.  DISCHARGE DIAGNOSES: 1. Severe flank pain. 2. Dyspnea.  CONSULTANTS:  None.  PROCEDURES:  CT scan of the abdomen and pelvis performed on September 12, which showed no acute abdominal or pelvic findings, mass, lesions, or lymphadenopathy.  Right-sided pleural effusion and overlying atelectasis is noted.  Stable atherosclerotic changes involving the aorta and branch vessels are noted.  There was no sign of kidney stone, hydronephrosis, or other abnormalities.  Lumbar spine series performed on September 12, which showed the patient have advanced lumbar disk and facet degeneration in the setting of chronic scoliosis.  No acute osseous abnormality is noted.  There was chronic multilevel disk disease, most severe at L3-L4 with chronic vacuum disk phenomenon.  HISTORY OF PRESENT ILLNESS:  Patient was seen in the office because of shortness of breath and flank pain on September 4th by Dr. Oliver Barre. Evaluation included CBC, which returned as normal.  BMET, which returned as normal.  A 12-lead EKG, which revealed atrial fibrillation with rate control.  Chest x-ray with small right pleural effusion and atelectasis, but no infiltrate or edema.  Patient had abdominal ultrasound with normal gallbladder and normal kidneys.  Patient continued to have intermittent severe flank pain.  Patient also had doubled her Lasix for 2 days because of possible fluid overload.  Because of her weakness and severe pain, she was admitted for further inpatient evaluation.  Please see the EMR generated the admit note for past  medical history, family history, social history, and admission exam.  HOSPITAL COURSE: 1. Flank and abdominal pain.  Patient's laboratory was unrevealing     with a normal white count.  Electrolytes were normal.  Renal     function was normal.  LFTs were normal.  Patient had CT of the     abdomen and pelvis and results as noted above.  Lumbar spine films     as noted above.  Patient did have one episode over the last 36     hours of severe pain that was relieved with Dilaudid.  At this     point with no further inpatient study being indicated, she is to be     discharged to home, although firm diagnosis not been established.     Thus working diagnosis would be a diskogenic pain, that is     intermittent.  PLAN: 1. Patient will be sent home with a limited supply of hydromorphone to     use for acute pain.  If this continues to be a problem, we set her     up as an outpatient for CT myelogram after bringing her off     Coumadin and bridging her with Lovenox. 2. Atrial fibrillation.  Patient was stable.  INR was stable. 3. Dyspnea.  Patient did have a mildly  elevated proBNP at 1614.  She     had 2D echo performed on the day of discharge, which showed an     ejection fraction of 60% to 65% rather, which might suggest a     hyperdynamic ventricle, although no mention in the study in regard     to relaxation phase.  High probability is that patient does have     some mild diastolic dysfunction.  Plan, patient will continue her     home medications and I have her to schedule a followup appointment     with Dr. Peter Swaziland.  DISCHARGE EXAMINATION:  VITAL SIGNS:  Temperature is 97.9, blood pressure 123/75, pulse 81, respirations 18, O2 sats 96% on room air. GENERAL APPEARANCE:  This is a very thin, frail-appearing woman looking older than her stated chronologic age, who is in no acute distress. HEENT EXAM:  Unremarkable. NECK:  Supple.  She has a 4 cm of JVD in the sitting  position. CHEST:  Patient is moving air well.  I did not appreciate any wheezing. There was no increased work of breathing, did not appreciate rales. CARDIOVASCULAR:  2+ radial pulse.  Her precordium was quiet.  She had an irregularly irregular rhythm that was rate controlled. ABDOMEN:  Nontender. DERM:  Patient has significant bruising erythema of her arms.  She has a dressing over abrasion, she sustained prior to admission. NEUROLOGICAL:  Patient is intact with no focal deficits.  FINAL LABORATORY:  INR on the day of discharge 2.54.  Cardiac panels were negative x3.  Urinalysis on the day of admission was negative with a specific gravity of 1.013.  ProBNP as noted at 1614.  Metabolic panel on the 11th with a sodium of 135, potassium 3.2, chloride of 97, CO2 of 28, BUN 28, creatinine 0.81, glucose was 96.  Liver function studies with bilirubin of 0.2, SGOT 20, SGPT of 15, total protein was 6.1, albumin was 3.1, slightly low.  DISPOSITION:  Discharged to home with no limitations in activities.  She will take Dilaudid as needed in addition to her other medications.  She will be set up for return office visit in 10 days.  She should contact the office, sooner if she has any worsening of her symptoms.  Patient's condition at the time of discharge dictation is medically stable, but guarded given her multiple comorbidities and advanced age.     Rosalyn Gess Norins, MD     MEN/MEDQ  D:  10/10/2010  T:  10/11/2010  Job:  [id number]  cc:   Peter M. Swaziland, M.D. Fax: [phone]  Electronically Signed by Illene Regulus MD on 10/13/2010 10:52:47 PM

## 2010-10-18 ENCOUNTER — Other Ambulatory Visit: Payer: Self-pay | Admitting: Dermatology

## 2010-10-22 ENCOUNTER — Ambulatory Visit (INDEPENDENT_AMBULATORY_CARE_PROVIDER_SITE_OTHER): Payer: Medicare Other | Admitting: Internal Medicine

## 2010-10-22 VITALS — BP 112/78 | HR 85 | Temp 97.4°F | Wt 108.0 lb

## 2010-10-22 DIAGNOSIS — I10 Essential (primary) hypertension: Secondary | ICD-10-CM

## 2010-10-22 DIAGNOSIS — I4891 Unspecified atrial fibrillation: Secondary | ICD-10-CM

## 2010-10-22 DIAGNOSIS — R0600 Dyspnea, unspecified: Secondary | ICD-10-CM

## 2010-10-22 DIAGNOSIS — Z111 Encounter for screening for respiratory tuberculosis: Secondary | ICD-10-CM

## 2010-10-22 DIAGNOSIS — R1011 Right upper quadrant pain: Secondary | ICD-10-CM

## 2010-10-22 DIAGNOSIS — R0609 Other forms of dyspnea: Secondary | ICD-10-CM

## 2010-10-23 ENCOUNTER — Emergency Department (HOSPITAL_COMMUNITY)
Admission: EM | Admit: 2010-10-23 | Discharge: 2010-10-24 | Disposition: A | Discharge status: Home / Self Care | Payer: Medicare Other | Attending: Emergency Medicine | Admitting: Emergency Medicine

## 2010-10-23 DIAGNOSIS — I252 Old myocardial infarction: Secondary | ICD-10-CM | POA: Insufficient documentation

## 2010-10-23 DIAGNOSIS — E7800 Pure hypercholesterolemia, unspecified: Secondary | ICD-10-CM | POA: Insufficient documentation

## 2010-10-23 DIAGNOSIS — Z7901 Long term (current) use of anticoagulants: Secondary | ICD-10-CM | POA: Insufficient documentation

## 2010-10-23 DIAGNOSIS — S81009A Unspecified open wound, unspecified knee, initial encounter: Secondary | ICD-10-CM | POA: Insufficient documentation

## 2010-10-23 DIAGNOSIS — Z7982 Long term (current) use of aspirin: Secondary | ICD-10-CM | POA: Insufficient documentation

## 2010-10-23 DIAGNOSIS — W07XXXA Fall from chair, initial encounter: Secondary | ICD-10-CM | POA: Insufficient documentation

## 2010-10-23 DIAGNOSIS — I10 Essential (primary) hypertension: Secondary | ICD-10-CM | POA: Insufficient documentation

## 2010-10-23 DIAGNOSIS — H409 Unspecified glaucoma: Secondary | ICD-10-CM | POA: Insufficient documentation

## 2010-10-23 DIAGNOSIS — E039 Hypothyroidism, unspecified: Secondary | ICD-10-CM | POA: Insufficient documentation

## 2010-10-23 DIAGNOSIS — I4891 Unspecified atrial fibrillation: Secondary | ICD-10-CM | POA: Insufficient documentation

## 2010-10-23 DIAGNOSIS — I2510 Atherosclerotic heart disease of native coronary artery without angina pectoris: Secondary | ICD-10-CM | POA: Insufficient documentation

## 2010-10-23 DIAGNOSIS — Z79899 Other long term (current) drug therapy: Secondary | ICD-10-CM | POA: Insufficient documentation

## 2010-10-24 LAB — CBC
HCT: 39.2
MCHC: 34.6
MCV: 88.1
Platelets: 222
WBC: 7.2

## 2010-10-24 LAB — BASIC METABOLIC PANEL
BUN: 12
CO2: 24
Chloride: 107
Creatinine, Ser: 0.75
Glucose, Bld: 95
Potassium: 3.9

## 2010-10-24 NOTE — Assessment & Plan Note (Signed)
EF is in normal range but she may have diastolic dysfunction. She does respond well to use of higher dose of diuretics.  Plan - continue present dose of furosemide           Follow-up with cardiology.

## 2010-10-24 NOTE — Assessment & Plan Note (Signed)
BP Readings from Last 3 Encounters:  10/22/10 112/78  10/08/10 114/68  10/02/10 102/60   Good control on present regimen

## 2010-10-24 NOTE — Assessment & Plan Note (Signed)
Stable rate on CCB and BB. She is fully anticoagulated.  Plan - no change in regimen           F/u with cardiology

## 2010-10-24 NOTE — Assessment & Plan Note (Signed)
diminished pain. No etiology for the severe RUQ and flank pain determined.   Plan- no further evaluation at this time

## 2010-10-24 NOTE — Progress Notes (Signed)
  Subjective:    Patient ID: Laurie Larsen, female    DOB: 1923-07-25, 77 y.o.   MRN: [id number]  HPI Mrs. Keener presents for hospital follow-up: she was in [facility] Sept 9-11, all records reviewed. She had been admitted for severe pain in the right flank and for dyspnea.    She continues to have flank pain but this is controlled with tramadol/APAP. No etiology for her pain was determined. She has not required hydromorphone.  Dyspnea was due to mild fluid overload with pBNP approx 1600. 2 D echo revealed EF 60% but question of hyperdynamic ventricle c/w  Diastolic failure. She diuresed well and wast discharged on increased dose of furosemide. She odes have mild recurrence of DOE when she skips doses. She is too make an appointment for follow-up with Dr. Swaziland.   Today she needs PPD placed as they prepare to move into Assisted Living.   I have reviewed the patient's medical history in detail and updated the computerized patient record.    Review of Systems System review is negative for any constitutional, cardiac, pulmonary, GI or neuro symptoms or complaints other than as described in the HPI.     Objective:   Physical Exam Vitals reviewed - appears stable Gen'l - thin elderly white woman in no distress Chest - good air movement, no rales or wheezes. Coarse breath sounds at the bases. Cor - IRIR, no murmur Abdomen - no tenderness, good BS Derm - multiple ecchymosis hands and arms.        Assessment & Plan:

## 2010-11-01 ENCOUNTER — Ambulatory Visit (INDEPENDENT_AMBULATORY_CARE_PROVIDER_SITE_OTHER): Payer: Medicare Other | Admitting: *Deleted

## 2010-11-01 DIAGNOSIS — I4891 Unspecified atrial fibrillation: Secondary | ICD-10-CM

## 2010-11-02 ENCOUNTER — Encounter: Payer: Self-pay | Admitting: Family Medicine

## 2010-11-02 ENCOUNTER — Ambulatory Visit (INDEPENDENT_AMBULATORY_CARE_PROVIDER_SITE_OTHER): Payer: Medicare Other | Admitting: Family Medicine

## 2010-11-02 ENCOUNTER — Other Ambulatory Visit: Payer: Self-pay | Admitting: Cardiology

## 2010-11-02 DIAGNOSIS — T148XXA Other injury of unspecified body region, initial encounter: Secondary | ICD-10-CM

## 2010-11-02 DIAGNOSIS — IMO0002 Reserved for concepts with insufficient information to code with codable children: Secondary | ICD-10-CM

## 2010-11-02 NOTE — Patient Instructions (Signed)
Call if see any signs of infection.  Apply the xerform every 2 days and keep covered until heals.

## 2010-11-02 NOTE — Progress Notes (Signed)
  Subjective:    Patient ID: Laurie Larsen, female    DOB: 26-May-1923, 77 y.o.   MRN: [id number]  HPI Micah Flesher to ED about 9 days ago after hit her foot on a chair. Had a large bleeding abrasion. In ED steristrips were placed.  Just completed keflex yesterday. Says has had some bloody drianage on the gauze. She has been using neosporin on it daily.    Review of Systems     Objective:   Physical Exam Large abrasion to the left lower ankle. I removed the 3 steri-strips.  I cleaned the wound and used iris scissors to removed the dead skin. Xeroform applied nd non adherent gauze applied. Coban 3 inch use to wrap the wound.        Assessment & Plan:  Abrasion - Healing has been poor likely secondary to age and on coumadin.  Dicussed wound care. She completed abx and there is not sign of infection on exam so no need for further abx tx. Call if any sign of infection.

## 2010-11-06 ENCOUNTER — Ambulatory Visit (INDEPENDENT_AMBULATORY_CARE_PROVIDER_SITE_OTHER): Payer: Medicare Other | Admitting: Endocrinology

## 2010-11-06 ENCOUNTER — Encounter: Payer: Self-pay | Admitting: Endocrinology

## 2010-11-06 VITALS — BP 110/80 | HR 82 | Temp 97.5°F | Wt 109.5 lb

## 2010-11-06 DIAGNOSIS — L97909 Non-pressure chronic ulcer of unspecified part of unspecified lower leg with unspecified severity: Secondary | ICD-10-CM

## 2010-11-06 NOTE — Patient Instructions (Addendum)
You will get better much faster if you elevate your foot above the rest of your body. Daily, you should replaces the antibiotic ointment on the area of broken skin, then cover with a bandage.   Refer to a wound-care specialist.  you will receive a phone call, about a day and time for an appointment.

## 2010-11-06 NOTE — Progress Notes (Signed)
Subjective:    Patient ID: Laurie Larsen, female    DOB: [birth year], 77 y.o.   MRN: [id number]  HPI 1 week ago, pt fell from a kitchen chair.  sicne then, she has 1 week of slight pain at the left leg, and assoc swelling.  She was rx'ed abx in the er, then was seen at sat office hrs 4 days ago.   Past Medical History  Diagnosis Date  . Permanent atrial fibrillation   . HTN (hypertension)   . Diastolic dysfunction   . CAD (coronary artery disease)     W/occlusion of distal circumflex branch 07-04-03  . GERD (gastroesophageal reflux disease)   . Sick sinus syndrome     W/DDD pacemaker  . Chronic pain     ? due to arthritis  . Glaucoma   . Chronic anticoagulation   . Hyperlipidemia   . Diverticulitis large intestine      by history  . Allergy   . Phlebitis     after pacemeker placement  . Colon polyps   . Thyroid disease     hypothyroidism on medications  . Transfusion history     after childbirth 60 years ago [birth year]1955)  . CHF (congestive heart failure)     hospitalized 07-03-08  . Rash/skin eruption     multiple episodes - wide distribution-intermittent, possibly drug rash.    Past Surgical History  Procedure Date  . Appendectomy   . Abdominal hysterectomy   . Tonsillectomy   . Kidney surgery     Right  . Ankle fracture surgery     Left, pin  . Pacemaker insertion     Dr Reyes Ivan (MDT) 5.25.2010  . Breast surgery     Needle guided excision, right breast calcification  . Breast fibroadenoma surgery     Right    History   Social History  . Marital Status: Married    Spouse Name: N/A    Number of Children: 3  . Years of Education: 13   Occupational History  . RETIRED    Social History Main Topics  . Smoking status: Never Smoker   . Smokeless tobacco: Never Used  . Alcohol Use: No  . Drug Use: Not on file  . Sexually Active: No   Other Topics Concern  . 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  Married 1948. 3 sons - 04-Jul-2050, 2055-07-04, 03-Jul-2056; 5 grandchildren-one deceased -  OD @ 74.  Lives alone with husband and they are independent in ADLs.  End of Life Care: no prolonged heroic measures, i.e. Artificial feeding or hydration; DNR; no prolonged intubation.    Current Outpatient Prescriptions on File Prior to Visit  Medication Sig Dispense Refill  . Acetaminophen (TYLENOL EX ST ARTHRITIS PAIN PO) Take by mouth as needed. For Pain.       [name] atenolol (TENORMIN) 50 MG tablet Take 25 mg by mouth daily.        [name] atorvastatin (LIPITOR) 40 MG tablet Take 20 mg by mouth at bedtime.        [name] diltiazem (CARDIZEM) 120 MG tablet Take 120 mg by mouth daily.        . dorzolamide-timolol (COSOPT) 22.3-6.8 MG/ML ophthalmic solution 1 drop as directed.        . furosemide (LASIX) 80 MG tablet Take 80 mg by mouth daily.        [name]  hydrOXYzine (ATARAX) 10 MG tablet Ad lib.      [name] KLOR-CON M10 10 MEQ tablet TAKE 1 TABLET BY MOUTH DAILY  30 tablet  5  . levothyroxine (SYNTHROID, LEVOTHROID) 125 MCG tablet Take 1 tablet (125 mcg total) by mouth daily. BRAND NAME SYNTHROID ONLY  90 tablet  3  . potassium chloride (KLOR-CON) 10 MEQ CR tablet Take 10 mEq by mouth daily.        . traMADol (ULTRAM) 50 MG tablet Take 50 mg by mouth as needed. For pain.       [name] travoprost, benzalkonium, (TRAVATAN) 0.004 % ophthalmic solution 1 drop as directed.        . warfarin (COUMADIN) 5 MG tablet Take 5 mg by mouth as directed.       [name] aspirin 81 MG EC tablet Take 81 mg by mouth daily.          Allergies  Allergen Reactions  . Amoxicillin     Weakness  . Digoxin     REACTION: weakness, dehydration  . Hydrocodone     REACTION: rash  . Risedronate Sodium     REACTION: rash    Family History  Problem Relation Age of Onset  . Cancer Mother     colon with mets  . Diabetes Mother   . Heart disease Father   . Hyperlipidemia Father   . Hypertension Father   . Stroke Brother     brother who  died 06/26/22  . Cancer Paternal Aunt     breast  . Cancer Cousin     uterine, lung cancer   BP 110/80  Pulse 82  Temp(Src) 97.5 F (36.4 C) (Oral)  Wt 109 lb 8 oz (49.669 kg)  SpO2 97%   Review of Systems Denies fever    Objective:   Physical Exam VITAL SIGNS:  See vs page GENERAL: no distress Left leg, medial aspect:  There is a 7x3 cm area of skin avulsion.  There is a 3 cm rim od slight swelling and erythema.  No drainage. Pulses: dorsalis pedis intact on the left.  Left foot: no deformity.  normal color and temp.  Neuro: sensation is intact to touch on the left foot.      Assessment & Plan:  Early leg ulcer, due to skin avulsion, not improving

## 2010-11-08 ENCOUNTER — Telehealth: Payer: Self-pay | Admitting: *Deleted

## 2010-11-11 ENCOUNTER — Other Ambulatory Visit: Payer: Self-pay | Admitting: Cardiology

## 2010-11-11 ENCOUNTER — Encounter (HOSPITAL_BASED_OUTPATIENT_CLINIC_OR_DEPARTMENT_OTHER): Payer: Medicare Other | Attending: Internal Medicine

## 2010-11-11 DIAGNOSIS — W1809XA Striking against other object with subsequent fall, initial encounter: Secondary | ICD-10-CM | POA: Insufficient documentation

## 2010-11-11 DIAGNOSIS — I4891 Unspecified atrial fibrillation: Secondary | ICD-10-CM | POA: Insufficient documentation

## 2010-11-11 DIAGNOSIS — I252 Old myocardial infarction: Secondary | ICD-10-CM | POA: Insufficient documentation

## 2010-11-11 DIAGNOSIS — Z950 Presence of cardiac pacemaker: Secondary | ICD-10-CM | POA: Insufficient documentation

## 2010-11-11 DIAGNOSIS — S91009A Unspecified open wound, unspecified ankle, initial encounter: Secondary | ICD-10-CM | POA: Insufficient documentation

## 2010-11-11 DIAGNOSIS — Z7982 Long term (current) use of aspirin: Secondary | ICD-10-CM | POA: Insufficient documentation

## 2010-11-11 DIAGNOSIS — Z79899 Other long term (current) drug therapy: Secondary | ICD-10-CM | POA: Insufficient documentation

## 2010-11-11 DIAGNOSIS — I509 Heart failure, unspecified: Secondary | ICD-10-CM | POA: Insufficient documentation

## 2010-11-11 DIAGNOSIS — Z7901 Long term (current) use of anticoagulants: Secondary | ICD-10-CM | POA: Insufficient documentation

## 2010-11-11 DIAGNOSIS — S81009A Unspecified open wound, unspecified knee, initial encounter: Secondary | ICD-10-CM | POA: Insufficient documentation

## 2010-11-12 NOTE — Progress Notes (Unsigned)
[facility]  NAME:  Laurie Larsen, Laurie Larsen              ACCOUNT NO.:  [id number]  MEDICAL RECORD NO.:  [id number]      DATE OF BIRTH:  February 13, 1923  PHYSICIAN:  Jonelle Sports. Gerrett Loman, M.D.  VISIT DATE:  11/11/2010                                  OFFICE VISIT   HISTORY:  This 77 year old white female is seen for evaluation of a traumatic wound of the distal left pretibial area.  The patient has been in reasonable health.  Although she has numerous medical problems as will be outlined below, these have been at a state of stability and she was doing well until she was standing on a chair and felt as if it was sliding backwards and so attempted to jump off before falling and jumped into the side of a cabinet sustaining a blunt injury to the distal pretibial area on the left.  She was seen in the emergency room where after some delay the skin avulsion flap was positioned back over the wound and sutured, but only a portion of that "took."  She has had subsequent deterioration of the skin of the remaining part of the wound and some accumulation of small amount of hematoma in that distal part of the wound.  Failing to see improvement, she arrived here for our evaluation and advice considering this.  PAST MEDICAL HISTORY:  Operations include hysterectomy at age 58, remote fibroid removal from breast, kidney surgery of uncertain nature in 1955, pinning of the fractured ankle in 1979 and bilateral cataract surgeries in 1999 and 2001.  Medical hospitalizations include 1 from myocardial infarction in 2006, for pacemaker insertion in 2007.  She has also had some difficulty with atrial fib and congestive heart failure, but apparently has not had hospitalizations in conjunction with those.  ALLERGIES:  She is allergic to HYDROCODONE which causes a rash, DIGITOXIN which causes weakness, and ACTONEL which causes a very severe rash.  REGULAR MEDICATIONS: 1. Coumadin 5 mg daily, which  she takes for anticoagulation in     association with her atrial fib. 2. Diltiazem 120 mg p.o. daily. 3. Lasix 40 mg p.o. daily. 4. Lipitor 20 mg p.o. daily. 5. Atenolol 25 mg p.o. daily. 6. Synthroid 125 mcg daily. 7. Aspirin 81 mg daily. 8. Azopt 1 drop in each eye twice daily. 9. Potassium 10 mEq daily. 10.Tramadol 50 mg p.o. as needed for pain. 11.Os-Cal+D 500 mg 2 times daily. 12.She uses over-the-counter Tylenol for arthritis as needed.  FAMILY HISTORY:  Apparently is notable for tobacco use, asthma, wheezing and also for coronary artery disease, peripheral arterial disease, blood clots and stroke.  PERSONAL HISTORY:  The patient is married, lives with her husband.  She is retired from employment as a Music therapist.  She does not smoke, drink alcohol, or use any illicit drugs.  She considers herself generally remarkably active for her age.  REVIEW OF SYSTEMS:  The patient does have some mild edema from time-to- time, but otherwise is stable from the point of view of her congestive heart failure.  She has no angina.  She has no particular awareness of her atrial fibrillation.  Her central nervous system function is good. She does notice some slight change in memory, but nothing striking.  She has had no seizures or episodes  of unexplained loss of consciousness. With respect to her lungs, she does have some mild degree of COPD, but does not require any ongoing medication for that and is presently quite stable.  GASTROINTESTINAL:  Has never had bleeding, despite her Coumadin cessation and has no history of peptic ulcer disease, colon cancer, or inflammatory bowel disease.  Hepatobiliary history is likewise negative. GENITOURINARY:  Has no history of urinary tract infection or of kidney stones.  Did have some operation of her kidney, which may have been a texy type procedure in 1955.  She has some degree of degenerative arthritis.  No rheumatoid or anything more serious.  Did  have the ankle fracture as previously indicated.  She has never been known to be anemic or to have any other blood dyscrasias.  PHYSICAL EXAMINATION:  VITAL SIGNS:  Blood pressure is 127/84, pulse is 72 and paced, respirations are 18, temperature is 97.7. GENERAL:  This is an alert, pleasant, and cooperative adult female; quite thin who appears her stated age and is in no immediate distress. HEENT:  Her mucous membranes are moist.  Her eyes are not specifically examined. NECK:  Her neck veins are flat with the patient lying at approximately 30 degrees __________.  Her carotid pulses are without bruits. SKIN: Turgor is good. CHEST:  Grossly clear to auscultation. HEART:  Tones are of fair quality and without definite murmur or gallop. ABDOMEN:  Nontender without organomegaly or masses. EXTREMITIES:  Really only trace edema at this time, not worse on one side than the other.  Her peripheral pulses are diminished, but palpable.  She has no open lesions on her feet distal to the wound that will be now described. On her left distal pretibial area, there is a linear wound measuring 5.3 x 1.7 x 0.1 cm, which is suggestive of previous avulsed skin with approximately 40% of the proximal skin satisfactory reattached and healing.  The distal portion of the wound shows no attachment of skin, some degeneration of the skin, and also a small hematoma formation from approximately 6 o'clock to 9 o'clock on the medial distal aspect of the wound. There is no surrounding erythema.  No drainage.  No odor.  No surrounding tenderness.  IMPRESSION:  Traumatic wound, left lower extremity.  DISPOSITION: 1. Using topical Xylocaine ointment, the wound is very carefully     debrided of the deteriorating skin from its distal portions and     also the majority of the hematoma is evacuated being scooped out     with the scalpel.  This is accomplished with minimal tolerable pain     to the patient.  Following  this, the wound was treated with     application of Santyl and Hydrogel and covered with a TL dressing. 2. The patient is instructed to leave this dressing on, to return in 1     week for re-evaluation, and to call sooner should she have     problems.  She is advised that we expect this wound to heal.          ______________________________ Jonelle Sports. Cheryll Cockayne, M.D.     RES/MEDQ  D:  11/11/2010  T:  11/12/2010  Job:  [id number]

## 2010-11-13 ENCOUNTER — Encounter: Payer: Self-pay | Admitting: Cardiology

## 2010-11-13 ENCOUNTER — Ambulatory Visit (INDEPENDENT_AMBULATORY_CARE_PROVIDER_SITE_OTHER): Payer: Medicare Other | Admitting: Cardiology

## 2010-11-13 VITALS — BP 120/70 | HR 70 | Ht 64.0 in | Wt 107.0 lb

## 2010-11-13 DIAGNOSIS — I4891 Unspecified atrial fibrillation: Secondary | ICD-10-CM

## 2010-11-13 DIAGNOSIS — I5033 Acute on chronic diastolic (congestive) heart failure: Secondary | ICD-10-CM | POA: Insufficient documentation

## 2010-11-13 DIAGNOSIS — I10 Essential (primary) hypertension: Secondary | ICD-10-CM

## 2010-11-13 DIAGNOSIS — I5032 Chronic diastolic (congestive) heart failure: Secondary | ICD-10-CM

## 2010-11-13 DIAGNOSIS — I509 Heart failure, unspecified: Secondary | ICD-10-CM

## 2010-11-13 DIAGNOSIS — Z7901 Long term (current) use of anticoagulants: Secondary | ICD-10-CM

## 2010-11-13 NOTE — Assessment & Plan Note (Signed)
Her presentation in September was consistent with congestive heart failure secondary to diastolic dysfunction. She has improved with increasing her Lasix dose. I recommended she stay on Lasix 80 mg per day. I will follow up again in 3 months.

## 2010-11-13 NOTE — Progress Notes (Signed)
Laurie Larsen Date of Birth: 1923/11/04   History of Present Illness: Laurie Larsen is seen today for followup of her atrial fibrillation. She was last seen during her hospital stay in September. She presented that time with shortness of breath and flank pain. She did have an elevated BNP level. Chest x-ray only showed a small effusion. An echocardiogram was unremarkable only showing grade 1 diastolic dysfunction. Her Lasix was increased to 80 mg per day and since then her dyspnea symptoms have resolved. She previously had orthopnea as well and this has resolved.  Current Outpatient Prescriptions on File Prior to Visit  Medication Sig Dispense Refill  . Acetaminophen (TYLENOL EX ST ARTHRITIS PAIN PO) Take by mouth as needed. For Pain.       [name] aspirin 81 MG EC tablet Take 81 mg by mouth daily.        [name] atenolol (TENORMIN) 50 MG tablet Take 25 mg by mouth daily.        [name] atorvastatin (LIPITOR) 40 MG tablet Take 20 mg by mouth at bedtime.        [name] diltiazem (CARDIZEM) 120 MG tablet Take 120 mg by mouth daily.        . dorzolamide-timolol (COSOPT) 22.3-6.8 MG/ML ophthalmic solution 1 drop as directed.        . furosemide (LASIX) 80 MG tablet Take 80 mg by mouth daily.       [name] KLOR-CON M10 10 MEQ tablet TAKE 1 TABLET BY MOUTH DAILY  30 tablet  5  . levothyroxine (SYNTHROID, LEVOTHROID) 125 MCG tablet Take 1 tablet (125 mcg total) by mouth daily. BRAND NAME SYNTHROID ONLY  90 tablet  3  . traMADol (ULTRAM) 50 MG tablet Take 50 mg by mouth as needed. For pain.       [name] travoprost, benzalkonium, (TRAVATAN) 0.004 % ophthalmic solution 1 drop as directed.        . warfarin (COUMADIN) 5 MG tablet Take as directed by anticoagulation clinic  35 tablet  3  . DISCONTD: warfarin (COUMADIN) 5 MG tablet Take 5 mg by mouth as directed.         Allergies  Allergen Reactions  . Actonel   . Amoxicillin     Weakness  . Digoxin     REACTION: weakness, dehydration  . Hydrocodone     REACTION: rash  .  Risedronate Sodium     REACTION: rash    Past Medical History  Diagnosis Date  . Permanent atrial fibrillation   . HTN (hypertension)   . Diastolic dysfunction   . CAD (coronary artery disease)     W/occlusion of distal circumflex branch 2005  . GERD (gastroesophageal reflux disease)   . Sick sinus syndrome     W/DDD pacemaker  . Chronic pain     ? due to arthritis  . Glaucoma   . Chronic anticoagulation   . Hyperlipidemia   . Diverticulitis large intestine      by history  . Allergy   . Phlebitis     after pacemeker placement  . Colon polyps   . Thyroid disease     hypothyroidism on medications  . Transfusion history     after childbirth 60 years ago (1957)  . CHF (congestive heart failure)     hospitalized 2010  . Rash/skin eruption     multiple episodes - wide distribution-intermittent, possibly drug rash.  . Vomiting     Past Surgical History  Procedure Date  . Appendectomy   .  Abdominal hysterectomy   . Tonsillectomy   . Kidney surgery     Right  . Ankle fracture surgery     Left, pin  . Pacemaker insertion     Dr Reyes Ivan ([facility]) 5.25.2010  . Breast surgery     Needle guided excision, right breast calcification  . Breast fibroadenoma surgery     Right    History  Smoking status  . Never Smoker   Smokeless tobacco  . Never Used    History  Alcohol Use No    Family History  Problem Relation Age of Onset  . Cancer Mother     colon with mets  . Diabetes Mother   . Heart disease Father   . Hyperlipidemia Father   . Hypertension Father   . Stroke Brother     brother who died 06/11/2022  . Cancer Paternal Aunt     breast  . Cancer Cousin     uterine, lung cancer    Review of Systems: She recently experienced a injury to her left shin with a nonhealing wound. She is being followed at the wound center now. All other systems were reviewed and are negative.  Physical Exam: BP 120/70  Pulse 70  Ht 5\' 4"  (1.626 m)  Wt 107 lb (48.535 kg)  BMI 18.37  kg/m2 She is an elderly white female who appears in good health. Her HEENT exam is unremarkable. She is normocephalic, atraumatic. Pupils are equal round and reactive. Sclera clear. Oropharynx is clear. Neck is without JVD or bruits. Lungs are clear. Cardiac exam reveals a regular rate and rhythm without gallop, murmur, or click. Abdomen is soft and nontender. She has no masses. She has only trace ankle edema. The wound on her left shin is covered with a dressing. LABORATORY DATA:   Assessment / Plan:

## 2010-11-13 NOTE — Assessment & Plan Note (Signed)
Her rate is well-controlled and she is on chronic anticoagulation with Coumadin.

## 2010-11-13 NOTE — Assessment & Plan Note (Signed)
Blood pressure control is satisfactory on her current medications. 

## 2010-11-13 NOTE — Patient Instructions (Addendum)
Continue taking Lasix 80 mg daily.  Continue your other medications.  I will see you again in 3 months.  Avoid salt.

## 2010-11-15 ENCOUNTER — Encounter: Payer: Medicare Other | Admitting: Cardiovascular Disease

## 2010-11-22 ENCOUNTER — Ambulatory Visit (INDEPENDENT_AMBULATORY_CARE_PROVIDER_SITE_OTHER): Payer: Medicare Other | Admitting: *Deleted

## 2010-11-22 DIAGNOSIS — I4891 Unspecified atrial fibrillation: Secondary | ICD-10-CM

## 2010-11-22 LAB — POCT INR: INR: 2.7

## 2010-11-22 NOTE — Telephone Encounter (Signed)
Refill

## 2010-12-02 ENCOUNTER — Encounter (HOSPITAL_BASED_OUTPATIENT_CLINIC_OR_DEPARTMENT_OTHER): Payer: Medicare Other | Attending: Internal Medicine

## 2010-12-02 DIAGNOSIS — S91009A Unspecified open wound, unspecified ankle, initial encounter: Secondary | ICD-10-CM | POA: Insufficient documentation

## 2010-12-02 DIAGNOSIS — I252 Old myocardial infarction: Secondary | ICD-10-CM | POA: Insufficient documentation

## 2010-12-02 DIAGNOSIS — Z7982 Long term (current) use of aspirin: Secondary | ICD-10-CM | POA: Insufficient documentation

## 2010-12-02 DIAGNOSIS — Z79899 Other long term (current) drug therapy: Secondary | ICD-10-CM | POA: Insufficient documentation

## 2010-12-02 DIAGNOSIS — S81009A Unspecified open wound, unspecified knee, initial encounter: Secondary | ICD-10-CM | POA: Insufficient documentation

## 2010-12-02 DIAGNOSIS — W1809XA Striking against other object with subsequent fall, initial encounter: Secondary | ICD-10-CM | POA: Insufficient documentation

## 2010-12-02 DIAGNOSIS — Z950 Presence of cardiac pacemaker: Secondary | ICD-10-CM | POA: Insufficient documentation

## 2010-12-02 DIAGNOSIS — I4891 Unspecified atrial fibrillation: Secondary | ICD-10-CM | POA: Insufficient documentation

## 2010-12-02 DIAGNOSIS — Z7901 Long term (current) use of anticoagulants: Secondary | ICD-10-CM | POA: Insufficient documentation

## 2010-12-02 DIAGNOSIS — I509 Heart failure, unspecified: Secondary | ICD-10-CM | POA: Insufficient documentation

## 2010-12-13 ENCOUNTER — Encounter: Payer: Medicare Other | Admitting: *Deleted

## 2010-12-16 ENCOUNTER — Ambulatory Visit (INDEPENDENT_AMBULATORY_CARE_PROVIDER_SITE_OTHER): Payer: Medicare Other | Admitting: *Deleted

## 2010-12-16 DIAGNOSIS — I4892 Unspecified atrial flutter: Secondary | ICD-10-CM

## 2010-12-16 DIAGNOSIS — I4891 Unspecified atrial fibrillation: Secondary | ICD-10-CM

## 2010-12-23 ENCOUNTER — Telehealth: Payer: Self-pay | Admitting: *Deleted

## 2010-12-23 MED ORDER — ATENOLOL 50 MG PO TABS: 25.0000 mg | ORAL_TABLET | Freq: Every day | ORAL | Status: DC

## 2010-12-23 MED ORDER — TRAMADOL HCL 50 MG PO TABS: 50.0000 mg | ORAL_TABLET | ORAL | Status: DC | PRN

## 2010-12-23 NOTE — Telephone Encounter (Signed)
Pt requesting refill on atenolol and tramadol, Please Advise

## 2010-12-23 NOTE — Telephone Encounter (Signed)
Ok to refill atenolol x 11, tramadol x 5

## 2010-12-30 ENCOUNTER — Encounter (HOSPITAL_BASED_OUTPATIENT_CLINIC_OR_DEPARTMENT_OTHER): Payer: Medicare Other | Attending: Internal Medicine

## 2010-12-30 DIAGNOSIS — Z79899 Other long term (current) drug therapy: Secondary | ICD-10-CM | POA: Insufficient documentation

## 2010-12-30 DIAGNOSIS — S81009A Unspecified open wound, unspecified knee, initial encounter: Secondary | ICD-10-CM | POA: Insufficient documentation

## 2010-12-30 DIAGNOSIS — Z7901 Long term (current) use of anticoagulants: Secondary | ICD-10-CM | POA: Insufficient documentation

## 2010-12-30 DIAGNOSIS — I509 Heart failure, unspecified: Secondary | ICD-10-CM | POA: Insufficient documentation

## 2010-12-30 DIAGNOSIS — I252 Old myocardial infarction: Secondary | ICD-10-CM | POA: Insufficient documentation

## 2010-12-30 DIAGNOSIS — X58XXXA Exposure to other specified factors, initial encounter: Secondary | ICD-10-CM | POA: Insufficient documentation

## 2010-12-30 DIAGNOSIS — I4891 Unspecified atrial fibrillation: Secondary | ICD-10-CM | POA: Insufficient documentation

## 2011-01-06 ENCOUNTER — Ambulatory Visit (INDEPENDENT_AMBULATORY_CARE_PROVIDER_SITE_OTHER): Payer: Medicare Other | Admitting: *Deleted

## 2011-01-06 DIAGNOSIS — I4891 Unspecified atrial fibrillation: Secondary | ICD-10-CM

## 2011-01-08 ENCOUNTER — Telehealth: Payer: Self-pay | Admitting: Cardiology

## 2011-01-08 NOTE — Telephone Encounter (Signed)
Called stating her heart rate is "fast" when she lies down. States Dr. Dagoberto Ligas decreased Atenolol to 25 mg daily instead of 25 mg BID when she was in the hospital. States BP is also going up. Had BP checked at wound clinic yesterday and was told it was elevated but she doesn't know what reading was. Per Dr. [place] can increase Atenolol to 25 mg BID. Advised to watch over next few days and if HR doesn't return to nml to call us back and will let Lawson Fiscal see her. She is agreeable with this plan and will call back if necessary

## 2011-01-08 NOTE — Telephone Encounter (Signed)
New message:  Pt called and would like to speak to you regarding her medication and symptoms she is having with fast heart rate and elevated blood pressure.  Please call and discuss.

## 2011-02-03 ENCOUNTER — Ambulatory Visit (INDEPENDENT_AMBULATORY_CARE_PROVIDER_SITE_OTHER): Payer: Medicare Other | Admitting: *Deleted

## 2011-02-03 ENCOUNTER — Encounter (HOSPITAL_BASED_OUTPATIENT_CLINIC_OR_DEPARTMENT_OTHER): Payer: Medicare Other | Attending: Internal Medicine

## 2011-02-03 DIAGNOSIS — S81009A Unspecified open wound, unspecified knee, initial encounter: Secondary | ICD-10-CM | POA: Insufficient documentation

## 2011-02-03 DIAGNOSIS — X58XXXA Exposure to other specified factors, initial encounter: Secondary | ICD-10-CM | POA: Insufficient documentation

## 2011-02-03 DIAGNOSIS — Z79899 Other long term (current) drug therapy: Secondary | ICD-10-CM | POA: Insufficient documentation

## 2011-02-03 DIAGNOSIS — I509 Heart failure, unspecified: Secondary | ICD-10-CM | POA: Insufficient documentation

## 2011-02-03 DIAGNOSIS — I252 Old myocardial infarction: Secondary | ICD-10-CM | POA: Insufficient documentation

## 2011-02-03 DIAGNOSIS — Z7901 Long term (current) use of anticoagulants: Secondary | ICD-10-CM | POA: Insufficient documentation

## 2011-02-03 DIAGNOSIS — I4891 Unspecified atrial fibrillation: Secondary | ICD-10-CM | POA: Insufficient documentation

## 2011-02-03 LAB — POCT INR: INR: 2.9

## 2011-02-11 ENCOUNTER — Ambulatory Visit (INDEPENDENT_AMBULATORY_CARE_PROVIDER_SITE_OTHER): Payer: Medicare Other | Admitting: Cardiology

## 2011-02-11 ENCOUNTER — Encounter: Payer: Self-pay | Admitting: Cardiology

## 2011-02-11 VITALS — BP 142/76 | HR 68 | Ht 64.0 in | Wt 111.8 lb

## 2011-02-11 DIAGNOSIS — I10 Essential (primary) hypertension: Secondary | ICD-10-CM

## 2011-02-11 DIAGNOSIS — I5032 Chronic diastolic (congestive) heart failure: Secondary | ICD-10-CM

## 2011-02-11 DIAGNOSIS — I4891 Unspecified atrial fibrillation: Secondary | ICD-10-CM

## 2011-02-11 DIAGNOSIS — I509 Heart failure, unspecified: Secondary | ICD-10-CM

## 2011-02-11 MED ORDER — FUROSEMIDE 80 MG PO TABS: 80.0000 mg | ORAL_TABLET | Freq: Every day | ORAL | Status: DC

## 2011-02-11 MED ORDER — DILTIAZEM HCL ER COATED BEADS 180 MG PO CP24: 180.0000 mg | ORAL_CAPSULE | Freq: Every day | ORAL | Status: DC

## 2011-02-11 MED ORDER — POTASSIUM CHLORIDE CRYS ER 10 MEQ PO TBCR: 10.0000 meq | EXTENDED_RELEASE_TABLET | Freq: Every day | ORAL | Status: DC

## 2011-02-11 MED ORDER — ATORVASTATIN CALCIUM 40 MG PO TABS: 20.0000 mg | ORAL_TABLET | Freq: Every day | ORAL | Status: DC

## 2011-02-11 NOTE — Assessment & Plan Note (Signed)
Her blood pressure is a little elevated today. We will increase her diltiazem to 180 mg per day. We will need to monitor her heart rate carefully to make sure she doesn't become bradycardic. If so we will need to consider alternative antihypertensive therapy.

## 2011-02-11 NOTE — Assessment & Plan Note (Signed)
Rate is well controlled on current medication and she is anticoagulated. We will continue rate control and anticoagulation.

## 2011-02-11 NOTE — Assessment & Plan Note (Signed)
She appears to be euvolemic today. We will continue with her current diuretic dose. Continue sodium restriction.

## 2011-02-11 NOTE — Progress Notes (Signed)
Laurie Larsen Date of Birth: 1923-12-26   History of Present Illness: Laurie Larsen is seen today for followup of her atrial fibrillation. She reports that she is doing well. She is concerned that her blood pressure is getting higher. It typically was running at 125 systolic but now is up to 140. She has completed her course of therapy at the wound Center for a nonhealing ulcer on her ankle. She previously called with increase in heart rate and we increased her atenolol dose.  Current Outpatient Prescriptions on File Prior to Visit  Medication Sig Dispense Refill  . Acetaminophen (TYLENOL EX ST ARTHRITIS PAIN PO) Take by mouth as needed. For Pain.       . dorzolamide-timolol (COSOPT) 22.3-6.8 MG/ML ophthalmic solution 1 drop as directed.        [name] levothyroxine (SYNTHROID, LEVOTHROID) 125 MCG tablet Take 1 tablet (125 mcg total) by mouth daily. BRAND NAME SYNTHROID ONLY  90 tablet  3  . Multiple Vitamins-Minerals (ICAPS AREDS FORMULA PO) Take by mouth.        . traMADol (ULTRAM) 50 MG tablet Take 1 tablet (50 mg total) by mouth as needed. For pain.  30 tablet  5  . travoprost, benzalkonium, (TRAVATAN) 0.004 % ophthalmic solution 1 drop as directed.        . warfarin (COUMADIN) 5 MG tablet Take as directed by anticoagulation clinic  35 tablet  3  . DISCONTD: atenolol (TENORMIN) 50 MG tablet Take 0.5 tablets (25 mg total) by mouth daily.  30 tablet  11  . DISCONTD: atorvastatin (LIPITOR) 40 MG tablet Take 20 mg by mouth at bedtime.          Allergies  Allergen Reactions  . Actonel   . Amoxicillin     Weakness  . Digoxin     REACTION: weakness, dehydration  . Hydrocodone     REACTION: rash  . Risedronate Sodium     REACTION: rash    Past Medical History  Diagnosis Date  . Permanent atrial fibrillation   . HTN (hypertension)   . Diastolic dysfunction   . CAD (coronary artery disease)     W/occlusion of distal circumflex branch 2005  . GERD (gastroesophageal reflux disease)   .  Sick sinus syndrome     W/DDD pacemaker  . Chronic pain     ? due to arthritis  . Glaucoma   . Chronic anticoagulation   . Hyperlipidemia   . Diverticulitis large intestine      by history  . Allergy   . Phlebitis     after pacemeker placement  . Colon polyps   . Thyroid disease     hypothyroidism on medications  . Transfusion history     after childbirth 60 years ago (1957)  . CHF (congestive heart failure)     hospitalized 2010  . Rash/skin eruption     multiple episodes - wide distribution-intermittent, possibly drug rash.  . Vomiting     Past Surgical History  Procedure Date  . Appendectomy   . Abdominal hysterectomy   . Tonsillectomy   . Kidney surgery     Right  . Ankle fracture surgery     Left, pin  . Pacemaker insertion     Dr Reyes Ivan ([facility]) 5.25.2010  . Breast surgery     Needle guided excision, right breast calcification  . Breast fibroadenoma surgery     Right    History  Smoking status  . Never Smoker   Smokeless  tobacco  . Never Used    History  Alcohol Use No    Family History  Problem Relation Age of Onset  . Cancer Mother     colon with mets  . Diabetes Mother   . Heart disease Father   . Hyperlipidemia Father   . Hypertension Father   . Stroke Brother     brother who died 2022-07-07  . Cancer Paternal Aunt     breast  . Cancer Cousin     uterine, lung cancer    Review of Systems: As noted in history of present illness. All other systems were reviewed and are negative.  Physical Exam: BP 142/76  Pulse 68  Ht 5\' 4"  (1.626 m)  Wt 111 lb 12.8 oz (50.712 kg)  BMI 19.19 kg/m2 She is an elderly white female who appears in good health. Her HEENT exam is unremarkable. She is normocephalic, atraumatic. Pupils are equal round and reactive. Sclera clear. Oropharynx is clear. Neck is without JVD or bruits. Lungs are clear. Cardiac exam reveals a regular rate and rhythm without gallop, murmur, or click. Abdomen is soft and nontender. She has no  masses. She has only trace ankle edema.  LABORATORY DATA:   Assessment / Plan:

## 2011-02-11 NOTE — Patient Instructions (Signed)
We will increase your diltiazem to 180 mg daily.  Continue your other medications.  I will see you again in 4 months.

## 2011-02-26 ENCOUNTER — Encounter: Payer: Self-pay | Admitting: Cardiology

## 2011-02-26 ENCOUNTER — Ambulatory Visit (INDEPENDENT_AMBULATORY_CARE_PROVIDER_SITE_OTHER): Payer: Medicare Other | Admitting: Cardiology

## 2011-02-26 VITALS — BP 130/80 | HR 80 | Ht 64.0 in | Wt 109.8 lb

## 2011-02-26 DIAGNOSIS — R609 Edema, unspecified: Secondary | ICD-10-CM

## 2011-02-26 DIAGNOSIS — I5030 Unspecified diastolic (congestive) heart failure: Secondary | ICD-10-CM

## 2011-02-26 DIAGNOSIS — I4891 Unspecified atrial fibrillation: Secondary | ICD-10-CM

## 2011-02-26 DIAGNOSIS — I5032 Chronic diastolic (congestive) heart failure: Secondary | ICD-10-CM

## 2011-02-26 DIAGNOSIS — R0602 Shortness of breath: Secondary | ICD-10-CM

## 2011-02-26 LAB — HEPATIC FUNCTION PANEL
ALT: 17 U/L (ref 0–35)
AST: 24 U/L (ref 0–37)
Bilirubin, Direct: 0.1 mg/dL (ref 0.0–0.3)
Total Bilirubin: 0.5 mg/dL (ref 0.3–1.2)

## 2011-02-26 LAB — BASIC METABOLIC PANEL
Chloride: 100 mEq/L (ref 96–112)
Creatinine, Ser: 0.5 mg/dL (ref 0.4–1.2)
GFR: 113.78 mL/min (ref >60.00)
Potassium: 3.3 mEq/L — ABNORMAL LOW (ref 3.5–5.1)

## 2011-02-26 LAB — CBC WITH DIFFERENTIAL/PLATELET
Basophils Absolute: 0.1 10*3/uL (ref 0.0–0.1)
Eosinophils Absolute: 0.1 10*3/uL (ref 0.0–0.7)
Lymphocytes Relative: 11.4 % — ABNORMAL LOW (ref 12.0–46.0)
MCHC: 34.3 g/dL (ref 30.0–36.0)
Monocytes Absolute: 0.6 10*3/uL (ref 0.1–1.0)
Neutrophils Relative %: 76.4 % (ref 43.0–77.0)
RDW: 14.3 % (ref 11.5–14.6)

## 2011-02-26 NOTE — Patient Instructions (Signed)
We will check blood work on you today.  Increase your Lasix to 80 mg in the morning and 40 mg in the afternoon.  I will see you again in 2 weeks.

## 2011-02-26 NOTE — Progress Notes (Signed)
[name] Date of Birth: 10-10-1923   History of Present Illness: Mrs. Aylesworth is seen today for followup today. She was just seen 2 weeks ago and was doing quite well. Over the past 5 days she developed increasing swelling in her left leg. She was seen at the wound Center and her leg was wrapped in an Ace bandage. There was no ulceration. She also complains of increased swelling in her right leg and swelling in her hands. There has been no change in her medication. She denies any sodium use. She's not using nonsteroidal anti-inflammatory drugs. She denies any increasing shortness of breath but just generally doesn't feel well.  Current Outpatient Prescriptions on File Prior to Visit  Medication Sig Dispense Refill  . Acetaminophen (TYLENOL EX ST ARTHRITIS PAIN PO) Take by mouth as needed. For Pain.       [name] atenolol (TENORMIN) 50 MG tablet Take 25 mg by mouth 2 (two) times daily.      [name] atorvastatin (LIPITOR) 40 MG tablet Take 0.5 tablets (20 mg total) by mouth at bedtime.  90 tablet  3  . diltiazem (CARDIZEM CD) 180 MG 24 hr capsule Take 1 capsule (180 mg total) by mouth daily.  90 capsule  3  . dorzolamide-timolol (COSOPT) 22.3-6.8 MG/ML ophthalmic solution 1 drop as directed.        . furosemide (LASIX) 80 MG tablet Take 1 tablet (80 mg total) by mouth daily.  90 tablet  3  . levothyroxine (SYNTHROID, LEVOTHROID) 125 MCG tablet Take 1 tablet (125 mcg total) by mouth daily. BRAND NAME SYNTHROID ONLY  90 tablet  3  . Multiple Vitamins-Minerals (ICAPS AREDS FORMULA PO) Take by mouth.        . potassium chloride (KLOR-CON M10) 10 MEQ tablet Take 1 tablet (10 mEq total) by mouth daily.  90 tablet  3  . traMADol (ULTRAM) 50 MG tablet Take 1 tablet (50 mg total) by mouth as needed. For pain.  30 tablet  5  . travoprost, benzalkonium, (TRAVATAN) 0.004 % ophthalmic solution 1 drop as directed.        . warfarin (COUMADIN) 5 MG tablet Take as directed by anticoagulation clinic  35 tablet  3     Allergies  Allergen Reactions  . Actonel   . Amoxicillin     Weakness  . Digoxin     REACTION: weakness, dehydration  . Hydrocodone     REACTION: rash  . Risedronate Sodium     REACTION: rash    Past Medical History  Diagnosis Date  . Permanent atrial fibrillation   . HTN (hypertension)   . Diastolic dysfunction   . CAD (coronary artery disease)     W/occlusion of distal circumflex branch 2005  . GERD (gastroesophageal reflux disease)   . Sick sinus syndrome     W/DDD pacemaker  . Chronic pain     ? due to arthritis  . Glaucoma   . Chronic anticoagulation   . Hyperlipidemia   . Diverticulitis large intestine      by history  . Allergy   . Phlebitis     after pacemeker placement  . Colon polyps   . Thyroid disease     hypothyroidism on medications  . Transfusion history     after childbirth 60 years ago (1957)  . CHF (congestive heart failure)     hospitalized 2010  . Rash/skin eruption     multiple episodes - wide distribution-intermittent, possibly drug rash.  [name]  Vomiting     Past Surgical History  Procedure Date  . Appendectomy   . Abdominal hysterectomy   . Tonsillectomy   . Kidney surgery     Right  . Ankle fracture surgery     Left, pin  . Pacemaker insertion     Dr Reyes Ivan ([facility]) 5.25.2010  . Breast surgery     Needle guided excision, right breast calcification  . Breast fibroadenoma surgery     Right    History  Smoking status  . Never Smoker   Smokeless tobacco  . Never Used    History  Alcohol Use No    Family History  Problem Relation Age of Onset  . Cancer Mother     colon with mets  . Diabetes Mother   . Heart disease Father   . Hyperlipidemia Father   . Hypertension Father   . Stroke Brother     brother who died 06/28/2022  . Cancer Paternal Aunt     breast  . Cancer Cousin     uterine, lung cancer    Review of Systems: As noted in history of present illness. All other systems were reviewed and are  negative.  Physical Exam: BP 130/80  Pulse 80  Ht 5\' 4"  (1.626 m)  Wt 109 lb 12.8 oz (49.805 kg)  BMI 18.85 kg/m2 She is an elderly white female who appears in good health. Her HEENT exam is unremarkable. She is normocephalic, atraumatic. Pupils are equal round and reactive. Sclera clear. Oropharynx is clear. Neck is without JVD or bruits. Lungs are clear. Cardiac exam reveals a regular rate and rhythm without gallop, murmur, or click. Abdomen is soft and nontender. She has no masses. She has only trace to 1+ right ankle edema. The left leg is wrapped from the knee to the toes. LABORATORY DATA:   Assessment / Plan:

## 2011-02-26 NOTE — Assessment & Plan Note (Signed)
TEE oligemia for her increased edema left greater than right is unclear. She does have a history of diastolic heart failure with atrial fibrillation. Her echocardiogram in September 2012 was normal. There been no recent medication changes. We will obtain lab work today including CBC, basic metabolic panel, hepatic function profile, and BNP level. We will increase her Lasix to 80 mg in the morning and 40 mg in the afternoon. She is going to wear her Ace wrap until next Thursday and then will transition to support hose. I will followup again in 2 weeks.

## 2011-03-03 ENCOUNTER — Ambulatory Visit (INDEPENDENT_AMBULATORY_CARE_PROVIDER_SITE_OTHER): Payer: Medicare Other | Admitting: *Deleted

## 2011-03-03 DIAGNOSIS — I4891 Unspecified atrial fibrillation: Secondary | ICD-10-CM

## 2011-03-03 LAB — POCT INR: INR: 1.7

## 2011-03-05 ENCOUNTER — Telehealth: Payer: Self-pay | Admitting: Cardiology

## 2011-03-05 ENCOUNTER — Other Ambulatory Visit: Payer: Self-pay

## 2011-03-05 DIAGNOSIS — I10 Essential (primary) hypertension: Secondary | ICD-10-CM

## 2011-03-05 MED ORDER — POTASSIUM CHLORIDE ER 10 MEQ PO TBCR: 10.0000 meq | EXTENDED_RELEASE_TABLET | Freq: Two times a day (BID) | ORAL | Status: DC

## 2011-03-11 ENCOUNTER — Ambulatory Visit (INDEPENDENT_AMBULATORY_CARE_PROVIDER_SITE_OTHER): Payer: Medicare Other | Admitting: Cardiology

## 2011-03-11 ENCOUNTER — Ambulatory Visit: Payer: Medicare Other | Admitting: Cardiology

## 2011-03-11 ENCOUNTER — Encounter: Payer: Self-pay | Admitting: Cardiology

## 2011-03-11 VITALS — BP 120/60 | HR 100 | Ht 64.0 in | Wt 110.4 lb

## 2011-03-11 DIAGNOSIS — I5032 Chronic diastolic (congestive) heart failure: Secondary | ICD-10-CM

## 2011-03-11 DIAGNOSIS — R609 Edema, unspecified: Secondary | ICD-10-CM

## 2011-03-11 DIAGNOSIS — R0602 Shortness of breath: Secondary | ICD-10-CM

## 2011-03-11 DIAGNOSIS — E785 Hyperlipidemia, unspecified: Secondary | ICD-10-CM

## 2011-03-11 DIAGNOSIS — I509 Heart failure, unspecified: Secondary | ICD-10-CM

## 2011-03-11 NOTE — Patient Instructions (Signed)
Continue sodium restriction.  Keep your legs elevated when possible. Wear support hose.  Stay on Lasix 40 mg twice a day and take an extra 40 mg as needed for swelling.  We will check your potassium and lipids in 2 weeks.  I will see you again in 3 months.

## 2011-03-12 NOTE — Progress Notes (Signed)
[name] Date of Birth: [birth year]   History of Present Illness: Mrs. Poth is seen today for followup today. Her lower extremity edema has improved. Her ace wrap has been removed. She has not yet started wearing support hose. No weeping or ulceration. She denies shortness of breath.   Current Outpatient Prescriptions on File Prior to Visit  Medication Sig Dispense Refill  . Acetaminophen (TYLENOL EX ST ARTHRITIS PAIN PO) Take by mouth as needed. For Pain.       [name] atenolol (TENORMIN) 50 MG tablet Take 25 mg by mouth 2 (two) times daily.      [name] atorvastatin (LIPITOR) 40 MG tablet Take 0.5 tablets (20 mg total) by mouth at bedtime.  90 tablet  3  . diltiazem (CARDIZEM CD) 180 MG 24 hr capsule Take 1 capsule (180 mg total) by mouth daily.  90 capsule  3  . dorzolamide-timolol (COSOPT) 22.3-6.8 MG/ML ophthalmic solution 1 drop as directed.        . furosemide (LASIX) 80 MG tablet Take 1 tablet (80 mg total) by mouth daily.  90 tablet  3  . levothyroxine (SYNTHROID, LEVOTHROID) 125 MCG tablet Take 1 tablet (125 mcg total) by mouth daily. BRAND NAME SYNTHROID ONLY  90 tablet  3  . Multiple Vitamins-Minerals (ICAPS AREDS FORMULA PO) Take by mouth.        . potassium chloride (KLOR-CON 10) 10 MEQ tablet Take 1 tablet (10 mEq total) by mouth 2 (two) times daily.  60 tablet  11  . traMADol (ULTRAM) 50 MG tablet Take 1 tablet (50 mg total) by mouth as needed. For pain.  30 tablet  5  . travoprost, benzalkonium, (TRAVATAN) 0.004 % ophthalmic solution 1 drop as directed.        . warfarin (COUMADIN) 5 MG tablet Take as directed by anticoagulation clinic  35 tablet  3    Allergies  Allergen Reactions  . Actonel   . Amoxicillin     Weakness  . Digoxin     REACTION: weakness, dehydration  . Hydrocodone     REACTION: rash  . Risedronate Sodium     REACTION: rash    Past Medical History  Diagnosis Date  . Permanent atrial fibrillation   . HTN (hypertension)   . Diastolic dysfunction    . CAD (coronary artery disease)     W/occlusion of distal circumflex branch 2005  . GERD (gastroesophageal reflux disease)   . Sick sinus syndrome     W/DDD pacemaker  . Chronic pain     ? due to arthritis  . Glaucoma   . Chronic anticoagulation   . Hyperlipidemia   . Diverticulitis large intestine      by history  . Allergy   . Phlebitis     after pacemeker placement  . Colon polyps   . Thyroid disease     hypothyroidism on medications  . Transfusion history     after childbirth 60 years ago (1957)  . CHF (congestive heart failure)     hospitalized 2010  . Rash/skin eruption     multiple episodes - wide distribution-intermittent, possibly drug rash.  . Vomiting     Past Surgical History  Procedure Date  . Appendectomy   . Abdominal hysterectomy   . Tonsillectomy   . Kidney surgery     Right  . Ankle fracture surgery     Left, pin  . Pacemaker insertion     Dr Reyes Ivan (MDT) 5.25.2010  . Breast  surgery     Needle guided excision, right breast calcification  . Breast fibroadenoma surgery     Right    History  Smoking status  . Never Smoker   Smokeless tobacco  . Never Used    History  Alcohol Use No    Family History  Problem Relation Age of Onset  . Cancer Mother     colon with mets  . Diabetes Mother   . Heart disease Father   . Hyperlipidemia Father   . Hypertension Father   . Stroke Brother     brother who died Jun 27, 2022  . Cancer Paternal Aunt     breast  . Cancer Cousin     uterine, lung cancer    Review of Systems: As noted in history of present illness. All other systems were reviewed and are negative.  Physical Exam: BP 120/60  Pulse 100  Ht 5\' 4"  (1.626 m)  Wt 50.077 kg (110 lb 6.4 oz)  BMI 18.95 kg/m2 She is an elderly white female who appears in good health. Her HEENT exam is unremarkable. She is normocephalic, atraumatic. Pupils are equal round and reactive. Sclera clear. Oropharynx is clear. Neck is without JVD or bruits. Lungs  are clear. Cardiac exam reveals a regular rate and rhythm without gallop, murmur, or click. Abdomen is soft and nontender. She has no masses. She has only trace right ankle edema. There is 1+ left lower extremity edema. LABORATORY DATA: Previous chemistries, CBC were normal except for an albumin of 3.4. BNP level was 226. This was down from a previous level of 1600.  Assessment / Plan:

## 2011-03-12 NOTE — Assessment & Plan Note (Signed)
She appears to be well compensated at this time. We suggested decreasing her Lasix back to 40 mg twice daily. She may take an extra 40 mg as needed for swelling. I stressed the importance of sodium restriction and wearing support hose. Will followup again in 3 months.

## 2011-03-18 ENCOUNTER — Other Ambulatory Visit: Payer: Medicare Other | Admitting: *Deleted

## 2011-03-18 ENCOUNTER — Encounter: Payer: Medicare Other | Admitting: *Deleted

## 2011-03-18 LAB — PROTIME-INR: INR: 2.2 — AB (ref 0.9–1.1)

## 2011-03-19 ENCOUNTER — Other Ambulatory Visit: Payer: Medicare Other | Admitting: *Deleted

## 2011-03-19 ENCOUNTER — Ambulatory Visit (INDEPENDENT_AMBULATORY_CARE_PROVIDER_SITE_OTHER): Payer: Self-pay | Admitting: Cardiovascular Disease

## 2011-03-19 DIAGNOSIS — R0989 Other specified symptoms and signs involving the circulatory and respiratory systems: Secondary | ICD-10-CM

## 2011-03-19 DIAGNOSIS — I4891 Unspecified atrial fibrillation: Secondary | ICD-10-CM

## 2011-03-24 ENCOUNTER — Other Ambulatory Visit: Payer: Self-pay | Admitting: Cardiology

## 2011-03-26 ENCOUNTER — Other Ambulatory Visit (INDEPENDENT_AMBULATORY_CARE_PROVIDER_SITE_OTHER): Payer: Medicare Other

## 2011-03-26 DIAGNOSIS — R609 Edema, unspecified: Secondary | ICD-10-CM

## 2011-03-26 DIAGNOSIS — E785 Hyperlipidemia, unspecified: Secondary | ICD-10-CM

## 2011-03-26 LAB — BASIC METABOLIC PANEL
Calcium: 8.9 mg/dL (ref 8.4–10.5)
Creatinine, Ser: 0.5 mg/dL (ref 0.4–1.2)
GFR: 113.76 mL/min (ref >60.00)
Sodium: 138 mEq/L (ref 135–145)

## 2011-03-26 LAB — LIPID PANEL
HDL: 51.1 mg/dL (ref >39.00)
LDL Cholesterol: 46 mg/dL (ref 0–99)
Total CHOL/HDL Ratio: 2
Triglycerides: 105 mg/dL (ref 0.0–149.0)
VLDL: 21 mg/dL (ref 0.0–40.0)

## 2011-03-26 LAB — HEPATIC FUNCTION PANEL
Alkaline Phosphatase: 95 U/L (ref 39–117)
Bilirubin, Direct: 0.1 mg/dL (ref 0.0–0.3)
Total Bilirubin: 0.6 mg/dL (ref 0.3–1.2)

## 2011-04-02 ENCOUNTER — Ambulatory Visit: Payer: Self-pay | Admitting: Cardiology

## 2011-04-02 DIAGNOSIS — I4891 Unspecified atrial fibrillation: Secondary | ICD-10-CM

## 2011-04-02 NOTE — Patient Instructions (Signed)
We discussed the dietary intake that is offered at facility, we can adjust coumadin with leafy green vegetable intake accordingly.

## 2011-04-03 ENCOUNTER — Encounter: Payer: Self-pay | Admitting: Internal Medicine

## 2011-04-03 ENCOUNTER — Ambulatory Visit (INDEPENDENT_AMBULATORY_CARE_PROVIDER_SITE_OTHER): Payer: Medicare Other | Admitting: Internal Medicine

## 2011-04-03 DIAGNOSIS — I4891 Unspecified atrial fibrillation: Secondary | ICD-10-CM

## 2011-04-03 DIAGNOSIS — I495 Sick sinus syndrome: Secondary | ICD-10-CM

## 2011-04-03 LAB — PACEMAKER DEVICE OBSERVATION
AL AMPLITUDE: 0.7 mv
BATTERY VOLTAGE: 2.97 V
BRDY-0002RV: 70 {beats}/min
BRDY-0004RV: 130 {beats}/min
VENTRICULAR PACING PM: 46.17

## 2011-04-03 NOTE — Progress Notes (Signed)
PCP:  Illene Regulus, MD, MD Primary Cardiologist:  Dr [place]  The patient presents today for routine electrophysiology followup.  Since last being seen in our clinic, the patient reports doing very well.  She remains quite active for her age.   She recently moved into a retirement center and is not happy as she feels that it is too big and she is now disconnected from her church and prior support system. Today, she denies symptoms of palpitations, chest pain, shortness of breath, dizziness, presyncope, syncope, or neurologic sequela.  Her edema is stable. The patient feels that she is tolerating medications without difficulties and is otherwise without complaint today.   Past Medical History  Diagnosis Date  . Permanent atrial fibrillation   . HTN (hypertension)   . Diastolic dysfunction   . CAD (coronary artery disease)     W/occlusion of distal circumflex branch 2005  . GERD (gastroesophageal reflux disease)   . Sick sinus syndrome     W/DDD pacemaker  . Chronic pain     ? due to arthritis  . Glaucoma   . Chronic anticoagulation   . Hyperlipidemia   . Diverticulitis large intestine      by history  . Allergy   . Phlebitis     after pacemeker placement  . Colon polyps   . Thyroid disease     hypothyroidism on medications  . Transfusion history     after childbirth 60 years ago (1957)  . CHF (congestive heart failure)     hospitalized 2010  . Rash/skin eruption     multiple episodes - wide distribution-intermittent, possibly drug rash.   Past Surgical History  Procedure Date  . Appendectomy   . Abdominal hysterectomy   . Tonsillectomy   . Kidney surgery     Right  . Ankle fracture surgery     Left, pin  . Pacemaker insertion     Dr Reyes Ivan (MDT) 5.25.2010  . Breast surgery     Needle guided excision, right breast calcification  . Breast fibroadenoma surgery     Right    Current Outpatient Prescriptions  Medication Sig Dispense Refill  . Acetaminophen (TYLENOL EX  ST ARTHRITIS PAIN PO) Take by mouth as needed. For Pain.       [name] atenolol (TENORMIN) 50 MG tablet Take 25 mg by mouth 2 (two) times daily.      [name] atorvastatin (LIPITOR) 40 MG tablet Take 0.5 tablets (20 mg total) by mouth at bedtime.  90 tablet  3  . diltiazem (CARDIZEM CD) 180 MG 24 hr capsule Take 1 capsule (180 mg total) by mouth daily.  90 capsule  3  . dorzolamide-timolol (COSOPT) 22.3-6.8 MG/ML ophthalmic solution 1 drop as directed.        . furosemide (LASIX) 80 MG tablet Take 1 tablet (80 mg total) by mouth daily.  90 tablet  3  . levothyroxine (SYNTHROID, LEVOTHROID) 125 MCG tablet Take 1 tablet (125 mcg total) by mouth daily. BRAND NAME SYNTHROID ONLY  90 tablet  3  . Multiple Vitamins-Minerals (ICAPS AREDS FORMULA PO) Take by mouth.        . potassium chloride (KLOR-CON 10) 10 MEQ tablet Take 1 tablet (10 mEq total) by mouth 2 (two) times daily.  60 tablet  11  . traMADol (ULTRAM) 50 MG tablet Take 1 tablet (50 mg total) by mouth as needed. For pain.  30 tablet  5  . travoprost, benzalkonium, (TRAVATAN) 0.004 % ophthalmic solution 1 drop as  directed.        . warfarin (COUMADIN) 5 MG tablet TAKE AS DIRECTED BY ANTICOAGULATION     CLINIC  40 tablet  3    Allergies  Allergen Reactions  . Actonel   . Amoxicillin     Weakness  . Digoxin     REACTION: weakness, dehydration  . Hydrocodone     REACTION: rash  . Risedronate Sodium     REACTION: rash    History   Social History  . Marital Status: Married    Spouse Name: N/A    Number of Children: 3  . Years of Education: 13   Occupational History  . RETIRED    Social History Main Topics  . Smoking status: Never Smoker   . Smokeless tobacco: Never Used  . Alcohol Use: No  . Drug Use: Not on file  . Sexually Active: No   Other Topics Concern  . 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  Married 1948. 3 sons - 2050-07-18,  2055-07-18, 2056-07-17; 5 grandchildren-one deceased -  OD @ 62.  Lives alone with husband and they are independent in ADLs.  End of Life Care: no prolonged heroic measures, i.e. Artificial feeding or hydration; DNR; no prolonged intubation.    Family History  Problem Relation Age of Onset  . Cancer Mother     colon with mets  . Diabetes Mother   . Heart disease Father   . Hyperlipidemia Father   . Hypertension Father   . Stroke Brother     brother who died 06-29-22  . Cancer Paternal Aunt     breast  . Cancer Cousin     uterine, lung cancer    Physical Exam: Filed Vitals:   04/03/11 1047  BP: 140/60  Pulse: 62  Resp: 18  Height: 5\' 5"  (1.651 m)  Weight: 108 lb 1.9 oz (49.043 kg)    GEN- The patient is well appearing, alert and oriented x 3 today.   Head- normocephalic, atraumatic Eyes-  Sclera clear, conjunctiva pink Ears- hearing intact Oropharynx- clear Neck- supple,  Lungs- Clear to ausculation bilaterally, normal work of breathing Chest- pacemaker pocket is well healed Heart- Regular rate and rhythm (paced) GI- soft, NT, ND, + BS Extremities- no clubbing, cyanosis, 1+ edema  Pacemaker interrogation- reviewed in detail today,  See PACEART report  Assessment and Plan:

## 2011-04-03 NOTE — Assessment & Plan Note (Signed)
Normal pacemaker function See Pace Art report No changes today  

## 2011-04-03 NOTE — Assessment & Plan Note (Signed)
INRs recently labile I suggested pradaxa as an alternative to coumadin. She will contemplate this option and contact our office if she decides to switch.

## 2011-04-03 NOTE — Patient Instructions (Signed)
Your physician wants you to follow-up in: 6 months in the device clinic and 12 months with Dr Allred You will receive a reminder letter in the mail two months in advance. If you don't receive a letter, please call our office to schedule the follow-up appointment.  

## 2011-04-15 ENCOUNTER — Encounter: Payer: Self-pay | Admitting: Cardiology

## 2011-04-15 ENCOUNTER — Ambulatory Visit: Payer: Self-pay | Admitting: Internal Medicine

## 2011-04-15 DIAGNOSIS — I4891 Unspecified atrial fibrillation: Secondary | ICD-10-CM

## 2011-04-15 NOTE — Progress Notes (Signed)
This encounter was created in error - please disregard.

## 2011-04-17 ENCOUNTER — Other Ambulatory Visit: Payer: Self-pay | Admitting: Family Medicine

## 2011-04-17 ENCOUNTER — Telehealth: Payer: Self-pay | Admitting: Cardiology

## 2011-04-17 DIAGNOSIS — R1011 Right upper quadrant pain: Secondary | ICD-10-CM

## 2011-04-17 NOTE — Telephone Encounter (Signed)
Faxed most recent ofc note and labs to [facility] Fax #[phone]

## 2011-04-18 ENCOUNTER — Ambulatory Visit
Admission: RE | Admit: 2011-04-18 | Discharge: 2011-04-18 | Disposition: A | Discharge status: Home / Self Care | Payer: Medicare Other | Source: Ambulatory Visit | Attending: Family Medicine | Admitting: Family Medicine

## 2011-04-18 DIAGNOSIS — R1011 Right upper quadrant pain: Secondary | ICD-10-CM

## 2011-04-21 ENCOUNTER — Encounter: Payer: Self-pay | Admitting: Internal Medicine

## 2011-04-21 ENCOUNTER — Other Ambulatory Visit: Payer: Self-pay | Admitting: Family Medicine

## 2011-04-21 ENCOUNTER — Ambulatory Visit
Admission: RE | Admit: 2011-04-21 | Discharge: 2011-04-21 | Disposition: A | Discharge status: Home / Self Care | Payer: Medicare Other | Source: Ambulatory Visit | Attending: Family Medicine | Admitting: Family Medicine

## 2011-04-21 DIAGNOSIS — R109 Unspecified abdominal pain: Secondary | ICD-10-CM

## 2011-04-21 MED ORDER — IOHEXOL 300 MG/ML  SOLN
30.0000 mL | Freq: Once | INTRAMUSCULAR | Status: AC | PRN
  Administered 2011-04-21: 30 mL via ORAL

## 2011-04-21 MED ORDER — IOHEXOL 300 MG/ML  SOLN
100.0000 mL | Freq: Once | INTRAMUSCULAR | Status: AC | PRN
  Administered 2011-04-21: 100 mL via INTRAVENOUS

## 2011-04-29 ENCOUNTER — Ambulatory Visit (INDEPENDENT_AMBULATORY_CARE_PROVIDER_SITE_OTHER): Payer: Medicare Other | Admitting: Cardiology

## 2011-04-29 DIAGNOSIS — I4891 Unspecified atrial fibrillation: Secondary | ICD-10-CM

## 2011-05-05 ENCOUNTER — Other Ambulatory Visit: Payer: Self-pay | Admitting: Gastroenterology

## 2011-05-08 ENCOUNTER — Ambulatory Visit
Admission: RE | Admit: 2011-05-08 | Discharge: 2011-05-08 | Disposition: A | Discharge status: Home / Self Care | Payer: Medicare Other | Source: Ambulatory Visit | Attending: Gastroenterology | Admitting: Gastroenterology

## 2011-05-08 ENCOUNTER — Other Ambulatory Visit: Payer: Self-pay | Admitting: Gastroenterology

## 2011-05-13 ENCOUNTER — Other Ambulatory Visit: Payer: Self-pay | Admitting: Gastroenterology

## 2011-05-13 ENCOUNTER — Ambulatory Visit: Payer: Self-pay | Admitting: Cardiology

## 2011-05-13 DIAGNOSIS — I4891 Unspecified atrial fibrillation: Secondary | ICD-10-CM

## 2011-05-13 LAB — POCT INR: INR: 2.9

## 2011-05-15 ENCOUNTER — Ambulatory Visit
Admission: RE | Admit: 2011-05-15 | Discharge: 2011-05-15 | Disposition: A | Discharge status: Home / Self Care | Payer: Medicare Other | Source: Ambulatory Visit | Attending: Gastroenterology | Admitting: Gastroenterology

## 2011-05-15 MED ORDER — IOHEXOL 350 MG/ML SOLN
100.0000 mL | Freq: Once | INTRAVENOUS | Status: AC | PRN
  Administered 2011-05-15: 100 mL via INTRAVENOUS

## 2011-05-27 ENCOUNTER — Ambulatory Visit: Payer: Self-pay | Admitting: Cardiology

## 2011-05-27 DIAGNOSIS — I4891 Unspecified atrial fibrillation: Secondary | ICD-10-CM

## 2011-05-27 LAB — POCT INR: INR: 2.2

## 2011-06-10 ENCOUNTER — Ambulatory Visit: Payer: Self-pay | Admitting: Internal Medicine

## 2011-06-10 ENCOUNTER — Telehealth: Payer: Self-pay | Admitting: Cardiology

## 2011-06-10 DIAGNOSIS — I4891 Unspecified atrial fibrillation: Secondary | ICD-10-CM

## 2011-06-10 LAB — POCT INR: INR: 1.7

## 2011-06-10 NOTE — Telephone Encounter (Signed)
See anticoagulation in EPIC for details.

## 2011-06-10 NOTE — Telephone Encounter (Signed)
New Problem:     Patient returned your call about her coumadin results.  Please call back.

## 2011-06-19 ENCOUNTER — Ambulatory Visit: Payer: Medicare Other | Admitting: Cardiology

## 2011-06-19 ENCOUNTER — Encounter: Payer: Self-pay | Admitting: Cardiology

## 2011-06-19 ENCOUNTER — Ambulatory Visit (INDEPENDENT_AMBULATORY_CARE_PROVIDER_SITE_OTHER): Payer: Medicare Other | Admitting: Cardiology

## 2011-06-19 VITALS — BP 159/90 | HR 77 | Ht 64.0 in | Wt 104.0 lb

## 2011-06-19 DIAGNOSIS — I5032 Chronic diastolic (congestive) heart failure: Secondary | ICD-10-CM

## 2011-06-19 DIAGNOSIS — F329 Major depressive disorder, single episode, unspecified: Secondary | ICD-10-CM

## 2011-06-19 DIAGNOSIS — I2510 Atherosclerotic heart disease of native coronary artery without angina pectoris: Secondary | ICD-10-CM

## 2011-06-19 DIAGNOSIS — I4891 Unspecified atrial fibrillation: Secondary | ICD-10-CM

## 2011-06-19 DIAGNOSIS — I509 Heart failure, unspecified: Secondary | ICD-10-CM

## 2011-06-19 MED ORDER — FUROSEMIDE 80 MG PO TABS: 80.0000 mg | ORAL_TABLET | Freq: Every day | ORAL | Status: DC

## 2011-06-19 MED ORDER — ATORVASTATIN CALCIUM 40 MG PO TABS: 20.0000 mg | ORAL_TABLET | Freq: Every day | ORAL | Status: DC

## 2011-06-19 NOTE — Patient Instructions (Signed)
Continue your current medication and sodium restriction.  I will see you again in 3 months.

## 2011-06-23 DIAGNOSIS — F329 Major depressive disorder, single episode, unspecified: Secondary | ICD-10-CM | POA: Insufficient documentation

## 2011-06-23 DIAGNOSIS — F32A Depression, unspecified: Secondary | ICD-10-CM | POA: Insufficient documentation

## 2011-06-23 NOTE — Progress Notes (Signed)
Laurie Larsen Date of Birth: [birth year]   History of Present Illness: Laurie Larsen is seen today for followup today. Since her last visit she had moved to a retirement community. She states that this has been very difficult for her. It has been a large place and she has not been able to adjust. She is planning to return home now and see how things go. This has caused increased apprehension and stress. She also has had periods of abdominal pain particularly associated with eating. She has undergone extensive GI evaluation. There was no clear evidence of mesenteric ischemia. She was found to have hepatosplenomegaly. It was suspected that her pain was related to chronic passive congestion. On followup today her swelling and edema are much better. She has lost 6 pounds. She wears support hose if she notices any swelling. She denies any shortness of breath or chest pain. She denies any palpitations.  Current Outpatient Prescriptions on File Prior to Visit  Medication Sig Dispense Refill  . Acetaminophen (TYLENOL EX ST ARTHRITIS PAIN PO) Take by mouth as needed. For Pain.       [name] atenolol (TENORMIN) 50 MG tablet Take 25 mg by mouth 2 (two) times daily.      [name] atorvastatin (LIPITOR) 40 MG tablet Take 0.5 tablets (20 mg total) by mouth at bedtime.  90 tablet  3  . diltiazem (CARDIZEM CD) 180 MG 24 hr capsule Take 1 capsule (180 mg total) by mouth daily.  90 capsule  3  . dorzolamide-timolol (COSOPT) 22.3-6.8 MG/ML ophthalmic solution 1 drop as directed.        . furosemide (LASIX) 80 MG tablet Take 1 tablet (80 mg total) by mouth daily.  90 tablet  3  . levothyroxine (SYNTHROID, LEVOTHROID) 125 MCG tablet Take 1 tablet (125 mcg total) by mouth daily. BRAND NAME SYNTHROID ONLY  90 tablet  3  . Multiple Vitamins-Minerals (ICAPS AREDS FORMULA PO) Take by mouth.        . potassium chloride (KLOR-CON 10) 10 MEQ tablet Take 1 tablet (10 mEq total) by mouth 2 (two) times daily.  60 tablet  11  . traMADol  (ULTRAM) 50 MG tablet Take 1 tablet (50 mg total) by mouth as needed. For pain.  30 tablet  5  . travoprost, benzalkonium, (TRAVATAN) 0.004 % ophthalmic solution 1 drop as directed.        . warfarin (COUMADIN) 5 MG tablet TAKE AS DIRECTED BY ANTICOAGULATION     CLINIC  40 tablet  3    Allergies  Allergen Reactions  . Amoxicillin     Weakness  . Digoxin     REACTION: weakness, dehydration  . Hydrocodone     REACTION: rash  . Risedronate Sodium     REACTION: rash  . Risedronate Sodium     Past Medical History  Diagnosis Date  . Permanent atrial fibrillation   . HTN (hypertension)   . Diastolic dysfunction   . CAD (coronary artery disease)     W/occlusion of distal circumflex branch 2005  . GERD (gastroesophageal reflux disease)   . Sick sinus syndrome     W/DDD pacemaker  . Chronic pain     ? due to arthritis  . Glaucoma   . Chronic anticoagulation   . Hyperlipidemia   . Diverticulitis large intestine      by history  . Allergy   . Phlebitis     after pacemeker placement  . Colon polyps   . Thyroid disease  hypothyroidism on medications  . Transfusion history     after childbirth 60 years ago (1957)  . CHF (congestive heart failure)     hospitalized 2010  . Rash/skin eruption     multiple episodes - wide distribution-intermittent, possibly drug rash.    Past Surgical History  Procedure Date  . Appendectomy   . Abdominal hysterectomy   . Tonsillectomy   . Kidney surgery     Right  . Ankle fracture surgery     Left, pin  . Pacemaker insertion     Dr Reyes Ivan (MDT) 5.25.2010  . Breast surgery     Needle guided excision, right breast calcification  . Breast fibroadenoma surgery     Right    History  Smoking status  . Never Smoker   Smokeless tobacco  . Never Used    History  Alcohol Use No    Family History  Problem Relation Age of Onset  . Cancer Mother     colon with mets  . Diabetes Mother   . Heart disease Father   . Hyperlipidemia  Father   . Hypertension Father   . Stroke Brother     brother who died 06-23-2022  . Cancer Paternal Aunt     breast  . Cancer Cousin     uterine, lung cancer    Review of Systems: As noted in history of present illness. She is depressed. She would like to consider an antidepressant. All other systems were reviewed and are negative.  Physical Exam: BP 159/90  Pulse 77  Ht 5\' 4"  (1.626 m)  Wt 104 lb (47.174 kg)  BMI 17.85 kg/m2 She is an elderly white female who appears in good health. Her HEENT exam is unremarkable. She is normocephalic, atraumatic. Pupils are equal round and reactive. Sclera clear. Oropharynx is clear. Neck is without JVD or bruits. Lungs are clear. Cardiac exam reveals a regular rate and rhythm without gallop, murmur, or click. Abdomen is soft and nontender. She has no masses.  There is no lower extremity edema. LABORATORY DATA:   Assessment / Plan:

## 2011-06-23 NOTE — Assessment & Plan Note (Signed)
She is status post permanent pacemaker implant. Her rate is controlled. She is on chronic anticoagulation with Coumadin and is actively being monitored.

## 2011-06-23 NOTE — Assessment & Plan Note (Signed)
She has symptoms of depression and anxiety. I recommended that she discuss this with her primary care to determine her treatment options.

## 2011-06-23 NOTE — Assessment & Plan Note (Signed)
She appears to be very well compensated today on her current dose of Lasix. I have reinforced the need for sodium restriction. I agree that she should wear support hose if she has any swelling. It is quite possible that her abdominal complaints are related to passive hepatic and bile congestion from her congestive heart failure. I feel she is on appropriate therapy now.

## 2011-06-24 ENCOUNTER — Ambulatory Visit: Payer: Self-pay | Admitting: Cardiology

## 2011-06-24 DIAGNOSIS — I4891 Unspecified atrial fibrillation: Secondary | ICD-10-CM

## 2011-07-15 ENCOUNTER — Ambulatory Visit: Payer: Self-pay | Admitting: Pharmacist

## 2011-07-15 DIAGNOSIS — I4891 Unspecified atrial fibrillation: Secondary | ICD-10-CM

## 2011-07-15 LAB — POCT INR: INR: 2.9

## 2011-07-21 ENCOUNTER — Other Ambulatory Visit: Payer: Self-pay | Admitting: Family Medicine

## 2011-07-21 DIAGNOSIS — N63 Unspecified lump in unspecified breast: Secondary | ICD-10-CM

## 2011-07-30 ENCOUNTER — Ambulatory Visit
Admission: RE | Admit: 2011-07-30 | Discharge: 2011-07-30 | Disposition: A | Discharge status: Home / Self Care | Payer: Medicare Other | Source: Ambulatory Visit | Attending: Family Medicine | Admitting: Family Medicine

## 2011-07-30 ENCOUNTER — Other Ambulatory Visit: Payer: Self-pay | Admitting: Family Medicine

## 2011-07-30 DIAGNOSIS — N63 Unspecified lump in unspecified breast: Secondary | ICD-10-CM

## 2011-08-04 ENCOUNTER — Telehealth: Payer: Self-pay | Admitting: *Deleted

## 2011-08-04 ENCOUNTER — Other Ambulatory Visit: Payer: Self-pay | Admitting: Internal Medicine

## 2011-08-04 ENCOUNTER — Other Ambulatory Visit: Payer: Self-pay | Admitting: Cardiology

## 2011-08-04 NOTE — Telephone Encounter (Signed)
Opened in error

## 2011-08-12 ENCOUNTER — Ambulatory Visit: Payer: Self-pay | Admitting: Cardiology

## 2011-08-12 DIAGNOSIS — I4891 Unspecified atrial fibrillation: Secondary | ICD-10-CM

## 2011-08-19 ENCOUNTER — Encounter (INDEPENDENT_AMBULATORY_CARE_PROVIDER_SITE_OTHER): Payer: Self-pay | Admitting: Surgery

## 2011-08-19 ENCOUNTER — Other Ambulatory Visit (INDEPENDENT_AMBULATORY_CARE_PROVIDER_SITE_OTHER): Payer: Self-pay | Admitting: Surgery

## 2011-08-19 ENCOUNTER — Ambulatory Visit (INDEPENDENT_AMBULATORY_CARE_PROVIDER_SITE_OTHER): Payer: Medicare Other | Admitting: Surgery

## 2011-08-19 VITALS — BP 112/60 | HR 84 | Temp 98.1°F | Resp 16 | Ht 64.0 in | Wt 108.0 lb

## 2011-08-19 DIAGNOSIS — L723 Sebaceous cyst: Secondary | ICD-10-CM

## 2011-08-19 DIAGNOSIS — N6489 Other specified disorders of breast: Secondary | ICD-10-CM

## 2011-08-19 NOTE — Patient Instructions (Signed)
Keep a Band-Aid over the incision until it heals. We will call you in a few days with the pathology report from the biopsy is available.

## 2011-08-19 NOTE — Progress Notes (Signed)
UJ:[id number] and crusting  History of present illness: This patient's had a few weeks to months of a small amount of clear discharge from the left nipple area. Also noticed a little but of crusting. She was seen at the breast center and mammograms done. No definite lesion was seen but they did note some edema and thickening of the subareolar nipple tissue and crusting of the nipple. She was referred for surgical evaluation.  She had a wire localized excision of a right chest mass earlier she go. This turned out to be a benign fibroadenoma. She's had no other prior breast problems.  Past history family history and review of systems are all contained in the electronic medical record and not redictated this note.  Physical exam: Vital signs:BP 112/60  Pulse 84  Temp 98.1 F (36.7 C) (Temporal)  Resp 16  Ht 5\' 4"  (1.626 m)  Wt 108 lb (48.988 kg)  BMI 18.54 kg/m2 General: The patient is alert, elderly but younger than her stated age, no distress.  Breasts: Right breast is basically normal although there is a little bit of loss of tissue at the prior surgical biopsy site at the 6:00 position. To palpation the left breast is normal. There may be a little bit of thickening around the nipple and areola. There is some crusting of tip of the nipple, worrisome for Paget's.  Impression: New nipple discharge with nipple crusting and somewhat abnormal mammogram  Plan: I discussed the situation with the patient and recommended that we do, as a first step, a punch biopsy of the nipple. The patient was agreeable.  Procedure note: The nipple was Anesthetized with 1% Xylocaine with epinephrine I waited 2 minutes for good effect of the epinephrine. A 4 mm punch biopsy of the tip of the nipple was then done without difficulty. There is no bleeding but I did touch the area with silver nitrate. She will do routine dressing changes following this and I told her we could call her with the pathology report is  available, probably on Friday

## 2011-08-27 ENCOUNTER — Telehealth (INDEPENDENT_AMBULATORY_CARE_PROVIDER_SITE_OTHER): Payer: Self-pay

## 2011-08-27 ENCOUNTER — Telehealth (INDEPENDENT_AMBULATORY_CARE_PROVIDER_SITE_OTHER): Payer: Self-pay | Admitting: General Surgery

## 2011-08-27 ENCOUNTER — Encounter (INDEPENDENT_AMBULATORY_CARE_PROVIDER_SITE_OTHER): Payer: Self-pay

## 2011-08-27 NOTE — Telephone Encounter (Signed)
Spoke with patient. Made her aware pathology was benign. She told me it was still sore in that area and she still had a small amount of blood coming out of this area. I told her since she was on the blood thinner she may see some spotting, but to call us if this does not stop over the next week. She will call with any questions/concerns.

## 2011-08-27 NOTE — Telephone Encounter (Signed)
Pt called again to report that when she removed the band aid from her nipple she noticed that the drainage was green and crusty.  I asked if she felt feverish or chilled, and she said, "I am cold." I told her I would relay the message to [facility] who would call her back.

## 2011-08-27 NOTE — Telephone Encounter (Signed)
Please advise if you want to see patient

## 2011-08-27 NOTE — Telephone Encounter (Signed)
LMOM for patient to call back and ask for me. 

## 2011-08-27 NOTE — Telephone Encounter (Signed)
Per Dr Jamey Ripa to use some triple antibiotic ointment. Patient states she has been using this. I advised her to keep using it and we would make appt for a check with Dr Jamey Ripa. Appt made.

## 2011-08-29 ENCOUNTER — Ambulatory Visit (INDEPENDENT_AMBULATORY_CARE_PROVIDER_SITE_OTHER): Payer: Medicare Other | Admitting: Surgery

## 2011-08-29 ENCOUNTER — Encounter (INDEPENDENT_AMBULATORY_CARE_PROVIDER_SITE_OTHER): Payer: Self-pay | Admitting: Surgery

## 2011-08-29 VITALS — BP 132/74 | HR 95 | Temp 98.6°F | Ht 64.0 in | Wt 105.8 lb

## 2011-08-29 DIAGNOSIS — Z09 Encounter for follow-up examination after completed treatment for conditions other than malignant neoplasm: Secondary | ICD-10-CM

## 2011-08-29 NOTE — Progress Notes (Signed)
Chief complaint: Drainage from nipple biopsy site  History of present illness: This patient had some nipple crusting and there was some concern she might have Paget's of the nipple. A punch biopsy was done several days ago. Pathology showed evidence of epidermoid cyst arising in the nipple.  Since the biopsy she had drainage with what looks like some greenish material. She came into the office today for me to check.  Exam: Vital signs:BP 132/74  Pulse 95  Temp 98.6 F (37 C) (Temporal)  Ht 5\' 4"  (1.626 m)  Wt 105 lb 12.8 oz (47.991 kg)  BMI 18.16 kg/m2  SpO2 97% Breast: At the nipple biopsy site is some greenish necrotic looking material that I think is a little bit of dead skin around the biopsy. I debrided this there is a small cavity but no evidence of active infection. I think some of this was from the epidermoid cyst draining after the biopsy site.  Impression open wound status post biopsy of the nipple  Plan: I think she does do some local wound care. She will shower the area daily and apply sterile dressings. Short he has an appointment next week for followup. I gave her a copy of her path report.

## 2011-08-29 NOTE — Patient Instructions (Signed)
After your daily shower just put some sterile gauze on top of the nipple area. Keep the appointment with me next week as scheduled.

## 2011-09-02 ENCOUNTER — Ambulatory Visit (INDEPENDENT_AMBULATORY_CARE_PROVIDER_SITE_OTHER): Payer: Medicare Other | Admitting: Surgery

## 2011-09-02 ENCOUNTER — Encounter (INDEPENDENT_AMBULATORY_CARE_PROVIDER_SITE_OTHER): Payer: Self-pay | Admitting: Surgery

## 2011-09-02 VITALS — BP 110/62 | HR 92 | Resp 16 | Ht 64.0 in | Wt 108.0 lb

## 2011-09-02 DIAGNOSIS — Z09 Encounter for follow-up examination after completed treatment for conditions other than malignant neoplasm: Secondary | ICD-10-CM

## 2011-09-02 NOTE — Progress Notes (Signed)
Chief complaint: Recheck after punch biopsy left nipple  History of present illness I did a punch biopsy several days ago because of some nipple crusting. Pathology showed what appeared be an epidermoid cyst of the nipple area. I saw a few days ago with some drainage and we cauterized the area. She thinks it's getting better.  Exam: The tip of the nipple still has a little bit of crusting which I think is where the areas are clearly healed over. There is no evidence of infection.  Plan: She'll continue dressing changes as needed. We'll see her back in about 2 weeks.

## 2011-09-02 NOTE — Patient Instructions (Signed)
See me in about two weeks

## 2011-09-06 ENCOUNTER — Emergency Department: Payer: Self-pay | Admitting: Emergency Medicine

## 2011-09-11 ENCOUNTER — Ambulatory Visit: Payer: Self-pay | Admitting: Internal Medicine

## 2011-09-11 DIAGNOSIS — I4891 Unspecified atrial fibrillation: Secondary | ICD-10-CM

## 2011-09-17 ENCOUNTER — Other Ambulatory Visit: Payer: Self-pay | Admitting: *Deleted

## 2011-09-17 MED ORDER — ATENOLOL 50 MG PO TABS: 25.0000 mg | ORAL_TABLET | Freq: Two times a day (BID) | ORAL | Status: DC

## 2011-09-17 NOTE — Telephone Encounter (Signed)
Talked with patient that Dr. Debby Bud prescribed her Atenolol so we couldn't refill pt stated that Dr. Renie Ora the one that controls the med and she has appt with next week and will discuss med with him.  I sent in a refill today

## 2011-09-19 ENCOUNTER — Ambulatory Visit (INDEPENDENT_AMBULATORY_CARE_PROVIDER_SITE_OTHER): Payer: Medicare Other | Admitting: Surgery

## 2011-09-19 ENCOUNTER — Encounter (INDEPENDENT_AMBULATORY_CARE_PROVIDER_SITE_OTHER): Payer: Medicare Other | Admitting: Surgery

## 2011-09-19 ENCOUNTER — Encounter (INDEPENDENT_AMBULATORY_CARE_PROVIDER_SITE_OTHER): Payer: Self-pay | Admitting: Surgery

## 2011-09-19 VITALS — BP 124/90 | HR 76 | Temp 97.4°F | Ht 64.0 in | Wt 108.4 lb

## 2011-09-19 DIAGNOSIS — Z9889 Other specified postprocedural states: Secondary | ICD-10-CM

## 2011-09-19 NOTE — Progress Notes (Signed)
Chief complaint: Recheck after punch biopsy left nipple  History of present illness I did a punch biopsy several days ago because of some nipple crusting. Pathology showed what appeared be an epidermoid cyst of the nipple area.I saw her a few weeks ago and it was not yet healed.  Exam: The nipple has healed  Plan: RTC PRN

## 2011-09-19 NOTE — Patient Instructions (Signed)
We will see you again on an as needed basis. Please call the office at [phone] if you have any questions or concerns. Thank you for allowing us to take care of you.  

## 2011-09-24 ENCOUNTER — Ambulatory Visit (INDEPENDENT_AMBULATORY_CARE_PROVIDER_SITE_OTHER): Payer: Medicare Other | Admitting: Cardiology

## 2011-09-24 ENCOUNTER — Encounter: Payer: Self-pay | Admitting: Cardiology

## 2011-09-24 VITALS — BP 160/92 | HR 76 | Ht 64.0 in | Wt 106.0 lb

## 2011-09-24 DIAGNOSIS — I509 Heart failure, unspecified: Secondary | ICD-10-CM

## 2011-09-24 DIAGNOSIS — I4891 Unspecified atrial fibrillation: Secondary | ICD-10-CM

## 2011-09-24 DIAGNOSIS — Z7901 Long term (current) use of anticoagulants: Secondary | ICD-10-CM

## 2011-09-24 DIAGNOSIS — I10 Essential (primary) hypertension: Secondary | ICD-10-CM

## 2011-09-24 MED ORDER — LOSARTAN POTASSIUM 25 MG PO TABS: 25.0000 mg | ORAL_TABLET | Freq: Every day | ORAL | Status: DC

## 2011-09-24 MED ORDER — ATENOLOL 50 MG PO TABS: 25.0000 mg | ORAL_TABLET | Freq: Two times a day (BID) | ORAL | Status: DC

## 2011-09-24 NOTE — Progress Notes (Signed)
Laurie Larsen Date of Birth: 04-20-1923   History of Present Illness: Laurie Larsen is seen today for followup today. Since her last visit she complains of symptoms of fatigue and listlessness. She is having less abdominal pain. 2 weeks ago she states that half of her head felt crazy and she had a roaring in her ears. She went to the emergency room and reports that CT was negative. This was done at [facility]. Her Coumadin has been therapeutic. She denies any significant tachycardia, shortness of breath, or chest pain.  Current Outpatient Prescriptions on File Prior to Visit  Medication Sig Dispense Refill  . Acetaminophen (TYLENOL EX ST ARTHRITIS PAIN PO) Take by mouth as needed. For Pain.       [name] atorvastatin (LIPITOR) 40 MG tablet Take 0.5 tablets (20 mg total) by mouth at bedtime.  90 tablet  3  . diltiazem (CARDIZEM CD) 180 MG 24 hr capsule Take 1 capsule (180 mg total) by mouth daily.  90 capsule  3  . dorzolamide-timolol (COSOPT) 22.3-6.8 MG/ML ophthalmic solution 1 drop as directed.        . furosemide (LASIX) 80 MG tablet Take 1 tablet (80 mg total) by mouth daily.  90 tablet  3  . Multiple Vitamins-Minerals (ICAPS AREDS FORMULA PO) Take by mouth.        . potassium chloride (KLOR-CON 10) 10 MEQ tablet Take 1 tablet (10 mEq total) by mouth 2 (two) times daily.  60 tablet  11  . SYNTHROID 125 MCG tablet TAKE 1 TABLET BY MOUTH DAILY  90 tablet  1  . traMADol (ULTRAM) 50 MG tablet Take 1 tablet (50 mg total) by mouth as needed. For pain.  30 tablet  5  . travoprost, benzalkonium, (TRAVATAN) 0.004 % ophthalmic solution 1 drop as directed.        . warfarin (COUMADIN) 5 MG tablet TAKE AS DIRECTED BY ANTICOAGULATION     CLINIC  40 tablet  3  . DISCONTD: atenolol (TENORMIN) 50 MG tablet Take 0.5 tablets (25 mg total) by mouth 2 (two) times daily.  30 tablet  6  . losartan (COZAAR) 25 MG tablet Take 1 tablet (25 mg total) by mouth daily.  90 tablet  3    Allergies  Allergen  Reactions  . Amoxicillin     Weakness  . Digoxin     REACTION: weakness, dehydration  . Hydrocodone     REACTION: rash  . Risedronate Sodium     REACTION: rash    Past Medical History  Diagnosis Date  . Permanent atrial fibrillation   . HTN (hypertension)   . Diastolic dysfunction   . CAD (coronary artery disease)     W/occlusion of distal circumflex branch 2005  . Sick sinus syndrome     W/DDD pacemaker  . Chronic pain     ? due to arthritis  . Glaucoma   . Chronic anticoagulation   . Hyperlipidemia   . Diverticulitis large intestine      by history  . Allergy   . Phlebitis     after pacemeker placement  . Colon polyps   . Thyroid disease     hypothyroidism on medications  . Transfusion history     after childbirth 60 years ago (1957)  . CHF (congestive heart failure)     hospitalized 2010  . Rash/skin eruption     multiple episodes - wide distribution-intermittent, possibly drug rash.    Past Surgical History  Procedure Date  .  Appendectomy   . Abdominal hysterectomy   . Tonsillectomy   . Kidney surgery     Right  . Ankle fracture surgery     Left, pin  . Pacemaker insertion     Dr Reyes Ivan (MDT) 5.25.2010  . Breast surgery     Needle guided excision, right breast calcification  . Breast fibroadenoma surgery 2010    Right  . Skin graft     left leg    History  Smoking status  . Never Smoker   Smokeless tobacco  . Never Used    History  Alcohol Use No    Family History  Problem Relation Age of Onset  . Cancer Mother     colon with mets  . Diabetes Mother   . Heart disease Father   . Hyperlipidemia Father   . Hypertension Father   . Stroke Brother     brother who died 06/22/22  . Cancer Paternal Aunt     breast  . Cancer Cousin     uterine, lung cancer    Review of Systems: As noted in history of present illness.  All other systems were reviewed and are negative.  Physical Exam: BP 160/92  Pulse 76  Ht 5\' 4"  (1.626 m)  Wt 106 lb  (48.081 kg)  BMI 18.19 kg/m2 She is an elderly white female who appears in good health. Her HEENT exam is unremarkable. She is normocephalic, atraumatic. Pupils are equal round and reactive. Sclera clear. Oropharynx is clear. Neck is without JVD or bruits. Lungs are clear. Cardiac exam reveals a regular rate and rhythm without gallop, murmur, or click. Abdomen is soft and nontender. She has no masses.  There is no lower extremity edema. LABORATORY DATA: ECG today demonstrates atrial fibrillation with controlled ventricular response of 76 beats per minute. She has demand ventricular pacing.  Assessment / Plan: 1. Atrial fibrillation. Rate is well controlled and she is on chronic anticoagulation. Continue Coumadin, atenolol, and diltiazem.  2. Congestive heart failure secondary to diastolic dysfunction, chronic. This appears to be well compensated on her current Lasix dose.  3. Coronary disease with known occlusion of the distal left circumflex. She is asymptomatic.  4. Sick sinus syndrome status post pacemaker implant.  5. Hypertension. Blood pressure is elevated today as it was on her previous visit. I recommended the addition of losartan 25 mg daily. Renal function has been normal. We will followup again in 3 months and check a basic metabolic panel at that time.

## 2011-09-24 NOTE — Patient Instructions (Addendum)
Continue your current medication   We will add losartan 25 mg daily for your blood pressure.  I will see you again in 3 months with lab work.

## 2011-09-25 ENCOUNTER — Telehealth: Payer: Self-pay | Admitting: Cardiology

## 2011-09-25 NOTE — Telephone Encounter (Signed)
Please return call to pt at hm# [phone] she thinks she is having a reaction to her medication.

## 2011-09-25 NOTE — Telephone Encounter (Signed)
Spoke to patient she stated she had diarrhea after taking one losartan.Patient advised to try taking again and call back if diarrhea continues.

## 2011-10-01 ENCOUNTER — Encounter: Payer: Medicare Other | Admitting: Cardiology

## 2011-10-01 ENCOUNTER — Ambulatory Visit (INDEPENDENT_AMBULATORY_CARE_PROVIDER_SITE_OTHER): Payer: Medicare Other | Admitting: *Deleted

## 2011-10-01 ENCOUNTER — Encounter: Payer: Self-pay | Admitting: General Practice

## 2011-10-01 ENCOUNTER — Encounter: Payer: Self-pay | Admitting: Internal Medicine

## 2011-10-01 DIAGNOSIS — I495 Sick sinus syndrome: Secondary | ICD-10-CM

## 2011-10-01 DIAGNOSIS — I4891 Unspecified atrial fibrillation: Secondary | ICD-10-CM

## 2011-10-01 LAB — PACEMAKER DEVICE OBSERVATION
AL IMPEDENCE PM: 480 Ohm
BATTERY VOLTAGE: 2.96 V
BMOD-0001RV: LOW
BRDY-0002RV: 70 {beats}/min
BRDY-0004RV: 130 {beats}/min
VENTRICULAR PACING PM: 59.45

## 2011-10-01 NOTE — Progress Notes (Signed)
Pacer check in clinic  

## 2011-10-09 ENCOUNTER — Ambulatory Visit: Payer: Self-pay | Admitting: Cardiology

## 2011-10-09 ENCOUNTER — Encounter: Payer: Self-pay | Admitting: *Deleted

## 2011-10-09 DIAGNOSIS — I4891 Unspecified atrial fibrillation: Secondary | ICD-10-CM

## 2011-10-28 DEATH — deceased

## 2011-11-06 ENCOUNTER — Ambulatory Visit: Payer: Self-pay | Admitting: Internal Medicine

## 2011-11-06 DIAGNOSIS — I4891 Unspecified atrial fibrillation: Secondary | ICD-10-CM

## 2011-11-19 LAB — PROTIME-INR: INR: 2.3 — AB (ref 0.9–1.1)

## 2011-11-20 ENCOUNTER — Ambulatory Visit: Payer: Self-pay | Admitting: Cardiovascular Disease

## 2011-11-20 DIAGNOSIS — I4891 Unspecified atrial fibrillation: Secondary | ICD-10-CM

## 2011-12-11 ENCOUNTER — Ambulatory Visit: Payer: Self-pay | Admitting: Cardiology

## 2011-12-11 DIAGNOSIS — I4891 Unspecified atrial fibrillation: Secondary | ICD-10-CM

## 2011-12-11 LAB — PROTIME-INR: INR: 2.7 — AB (ref 0.9–1.1)

## 2011-12-22 ENCOUNTER — Ambulatory Visit (INDEPENDENT_AMBULATORY_CARE_PROVIDER_SITE_OTHER): Payer: Medicare Other | Admitting: Cardiology

## 2011-12-22 ENCOUNTER — Encounter: Payer: Self-pay | Admitting: Cardiology

## 2011-12-22 ENCOUNTER — Other Ambulatory Visit (INDEPENDENT_AMBULATORY_CARE_PROVIDER_SITE_OTHER): Payer: Medicare Other

## 2011-12-22 VITALS — BP 158/90 | HR 77 | Ht 64.0 in | Wt 106.0 lb

## 2011-12-22 DIAGNOSIS — I509 Heart failure, unspecified: Secondary | ICD-10-CM

## 2011-12-22 DIAGNOSIS — I4891 Unspecified atrial fibrillation: Secondary | ICD-10-CM

## 2011-12-22 DIAGNOSIS — I5032 Chronic diastolic (congestive) heart failure: Secondary | ICD-10-CM

## 2011-12-22 DIAGNOSIS — Z7901 Long term (current) use of anticoagulants: Secondary | ICD-10-CM

## 2011-12-22 DIAGNOSIS — I10 Essential (primary) hypertension: Secondary | ICD-10-CM

## 2011-12-22 DIAGNOSIS — I495 Sick sinus syndrome: Secondary | ICD-10-CM

## 2011-12-22 LAB — BASIC METABOLIC PANEL
Calcium: 8.9 mg/dL (ref 8.4–10.5)
Creatinine, Ser: 0.8 mg/dL (ref 0.4–1.2)
GFR: 72.15 mL/min (ref >60.00)
Glucose, Bld: 97 mg/dL (ref 70–99)
Sodium: 134 mEq/L — ABNORMAL LOW (ref 135–145)

## 2011-12-22 NOTE — Patient Instructions (Signed)
Continue your current therapy  I will see you again in 6 months.   

## 2011-12-22 NOTE — Progress Notes (Signed)
Laurie Larsen Date of Birth: Nov 26, 1923   History of Present Illness: Laurie Larsen is seen today for followup today. She seems to be doing well today. She reports that her blood pressure typically is below 140 systolic. Occasionally it will fluctuate up to 150s systolic. She complains of difficulty sleeping. She still remains depressed and cries a lot. She is still upset about her living situation. She denies any shortness of breath or increased chest pain. He has had no palpitations.  Current Outpatient Prescriptions on File Prior to Visit  Medication Sig Dispense Refill  . Acetaminophen (TYLENOL EX ST ARTHRITIS PAIN PO) Take by mouth as needed. For Pain.       [name] atenolol (TENORMIN) 50 MG tablet Take 0.5 tablets (25 mg total) by mouth 2 (two) times daily.  90 tablet  6  . atorvastatin (LIPITOR) 40 MG tablet Take 0.5 tablets (20 mg total) by mouth at bedtime.  90 tablet  3  . diltiazem (CARDIZEM CD) 180 MG 24 hr capsule Take 1 capsule (180 mg total) by mouth daily.  90 capsule  3  . dorzolamide-timolol (COSOPT) 22.3-6.8 MG/ML ophthalmic solution 1 drop as directed.        . furosemide (LASIX) 80 MG tablet Take 1 tablet (80 mg total) by mouth daily.  90 tablet  3  . losartan (COZAAR) 25 MG tablet Take 1 tablet (25 mg total) by mouth daily.  90 tablet  3  . Multiple Vitamins-Minerals (ICAPS AREDS FORMULA PO) Take by mouth.        . potassium chloride (KLOR-CON 10) 10 MEQ tablet Take 1 tablet (10 mEq total) by mouth 2 (two) times daily.  60 tablet  11  . SYNTHROID 125 MCG tablet TAKE 1 TABLET BY MOUTH DAILY  90 tablet  1  . traMADol (ULTRAM) 50 MG tablet Take 1 tablet (50 mg total) by mouth as needed. For pain.  30 tablet  5  . travoprost, benzalkonium, (TRAVATAN) 0.004 % ophthalmic solution 1 drop as directed.        . warfarin (COUMADIN) 5 MG tablet TAKE AS DIRECTED BY ANTICOAGULATION     CLINIC  40 tablet  3    Allergies  Allergen Reactions  . Amoxicillin     Weakness  . Digoxin    REACTION: weakness, dehydration  . Hydrocodone     REACTION: rash  . Risedronate Sodium     REACTION: rash    Past Medical History  Diagnosis Date  . Permanent atrial fibrillation   . HTN (hypertension)   . Diastolic dysfunction   . CAD (coronary artery disease)     W/occlusion of distal circumflex branch 2005  . Sick sinus syndrome     W/DDD pacemaker  . Chronic pain     ? due to arthritis  . Glaucoma(365)   . Chronic anticoagulation   . Hyperlipidemia   . Diverticulitis large intestine      by history  . Allergy   . Phlebitis     after pacemeker placement  . Colon polyps   . Thyroid disease     hypothyroidism on medications  . Transfusion history     after childbirth 60 years ago (1957)  . CHF (congestive heart failure)     hospitalized 2010  . Rash/skin eruption     multiple episodes - wide distribution-intermittent, possibly drug rash.    Past Surgical History  Procedure Date  . Appendectomy   . Abdominal hysterectomy   . Tonsillectomy   .  Kidney surgery     Right  . Ankle fracture surgery     Left, pin  . Pacemaker insertion     Dr Reyes Ivan (MDT) 5.25.2010  . Breast surgery     Needle guided excision, right breast calcification  . Breast fibroadenoma surgery 2010    Right  . Skin graft     left leg    History  Smoking status  . Never Smoker   Smokeless tobacco  . Never Used    History  Alcohol Use No    Family History  Problem Relation Age of Onset  . Cancer Mother     colon with mets  . Diabetes Mother   . Heart disease Father   . Hyperlipidemia Father   . Hypertension Father   . Stroke Brother     brother who died 06/24/22  . Cancer Paternal Aunt     breast  . Cancer Cousin     uterine, lung cancer    Review of Systems: As noted in history of present illness.  All other systems were reviewed and are negative.  Physical Exam: BP 158/90  Pulse 77  Ht 5\' 4"  (1.626 m)  Wt 106 lb (48.081 kg)  BMI 18.19 kg/m2  SpO2 96% She is an  elderly white female who appears in good health. Her HEENT exam is unremarkable. She is normocephalic, atraumatic. Pupils are equal round and reactive. Sclera clear. Oropharynx is clear. Neck is without JVD or bruits. Lungs are clear. Cardiac exam reveals a regular rate and rhythm without gallop, murmur, or click. Abdomen is soft and nontender. She has no masses.  There is no lower extremity edema. LABORATORY DATA:   Assessment / Plan: 1. Atrial fibrillation. Rate is well controlled and she is on chronic anticoagulation. Continue Coumadin, atenolol, and diltiazem.  2. Congestive heart failure secondary to diastolic dysfunction, chronic. This appears to be well compensated on her current Lasix dose.  3. Coronary disease with known occlusion of the distal left circumflex. She is asymptomatic.  4. Sick sinus syndrome status post pacemaker implant.  5. Hypertension. Blood pressure is elevated today but her overall blood pressure readings have improved with the initiation of losartan. Check a basic metabolic panel today. We will continue her current therapy and followup again in 6 months.  6. Situational depression. Recommend followup with PCP. I think her insomnia is related to this. She asked about taking a sleeping pill but I would not recommend it at her age.

## 2012-01-13 ENCOUNTER — Ambulatory Visit: Payer: Self-pay | Admitting: Cardiovascular Disease

## 2012-01-13 DIAGNOSIS — I4891 Unspecified atrial fibrillation: Secondary | ICD-10-CM

## 2012-01-15 ENCOUNTER — Other Ambulatory Visit: Payer: Self-pay

## 2012-01-15 MED ORDER — DILTIAZEM HCL ER COATED BEADS 180 MG PO CP24: 180.0000 mg | ORAL_CAPSULE | Freq: Every day | ORAL | Status: DC

## 2012-01-27 ENCOUNTER — Ambulatory Visit: Payer: Self-pay | Admitting: Cardiology

## 2012-01-27 DIAGNOSIS — I4891 Unspecified atrial fibrillation: Secondary | ICD-10-CM

## 2012-01-29 ENCOUNTER — Other Ambulatory Visit: Payer: Self-pay

## 2012-02-02 ENCOUNTER — Other Ambulatory Visit: Payer: Self-pay

## 2012-02-11 LAB — PROTIME-INR: INR: 2.6 — AB (ref 0.9–1.1)

## 2012-02-12 ENCOUNTER — Ambulatory Visit: Payer: Self-pay | Admitting: Cardiology

## 2012-02-12 DIAGNOSIS — I4891 Unspecified atrial fibrillation: Secondary | ICD-10-CM

## 2012-02-26 ENCOUNTER — Encounter: Payer: Self-pay | Admitting: Cardiology

## 2012-02-26 ENCOUNTER — Ambulatory Visit: Payer: Self-pay | Admitting: Internal Medicine

## 2012-02-26 DIAGNOSIS — I4891 Unspecified atrial fibrillation: Secondary | ICD-10-CM

## 2012-02-26 MED ORDER — WARFARIN SODIUM 5 MG PO TABS: ORAL_TABLET | ORAL | Status: DC

## 2012-03-15 ENCOUNTER — Ambulatory Visit: Payer: Self-pay | Admitting: Cardiology

## 2012-03-15 DIAGNOSIS — I4891 Unspecified atrial fibrillation: Secondary | ICD-10-CM

## 2012-03-26 ENCOUNTER — Other Ambulatory Visit: Payer: Self-pay | Admitting: Internal Medicine

## 2012-03-26 ENCOUNTER — Other Ambulatory Visit: Payer: Self-pay

## 2012-03-26 MED ORDER — POTASSIUM CHLORIDE ER 10 MEQ PO TBCR: 10.0000 meq | EXTENDED_RELEASE_TABLET | Freq: Two times a day (BID) | ORAL | Status: DC

## 2012-03-31 LAB — PROTIME-INR: INR: 2.2 — AB (ref 0.9–1.1)

## 2012-04-01 ENCOUNTER — Ambulatory Visit: Payer: Self-pay | Admitting: Cardiology

## 2012-04-01 DIAGNOSIS — I4891 Unspecified atrial fibrillation: Secondary | ICD-10-CM

## 2012-04-06 ENCOUNTER — Ambulatory Visit: Payer: Self-pay | Admitting: Internal Medicine

## 2012-04-06 DIAGNOSIS — I4891 Unspecified atrial fibrillation: Secondary | ICD-10-CM

## 2012-04-09 ENCOUNTER — Encounter: Payer: Self-pay | Admitting: Internal Medicine

## 2012-04-09 ENCOUNTER — Ambulatory Visit (INDEPENDENT_AMBULATORY_CARE_PROVIDER_SITE_OTHER): Payer: Medicare Other | Admitting: Internal Medicine

## 2012-04-09 ENCOUNTER — Encounter: Payer: Self-pay | Admitting: *Deleted

## 2012-04-09 VITALS — BP 132/67 | HR 77 | Ht 63.0 in | Wt 104.2 lb

## 2012-04-09 DIAGNOSIS — I495 Sick sinus syndrome: Secondary | ICD-10-CM

## 2012-04-09 DIAGNOSIS — I4891 Unspecified atrial fibrillation: Secondary | ICD-10-CM

## 2012-04-09 LAB — PACEMAKER DEVICE OBSERVATION
AL AMPLITUDE: 0.9388 mv
BMOD-0002RV: 7
BMOD-0003RV: 30
RV LEAD AMPLITUDE: 8.279 mv
RV LEAD IMPEDENCE PM: 464 Ohm
VENTRICULAR PACING PM: 62.81

## 2012-04-09 NOTE — Progress Notes (Signed)
PCP:  Mickie Hillier, MD Primary Cardiologist:  Dr [place]  The patient presents today for routine electrophysiology followup.  Since last being seen in our clinic, the patient reports doing very well.  She remains quite active for her age.   Her primary concern is with chronic back pain.  Today, she denies symptoms of palpitations, chest pain, shortness of breath, dizziness, presyncope, syncope, or neurologic sequela.  Her edema is stable. The patient feels that she is tolerating medications without difficulties and is otherwise without complaint today.   Past Medical History  Diagnosis Date  . Permanent atrial fibrillation   . HTN (hypertension)   . Diastolic dysfunction   . CAD (coronary artery disease)     W/occlusion of distal circumflex branch 2005  . Sick sinus syndrome     W/DDD pacemaker  . Chronic pain     ? due to arthritis  . Glaucoma(365)   . Chronic anticoagulation   . Hyperlipidemia   . Diverticulitis large intestine      by history  . Allergy   . Phlebitis     after pacemeker placement  . Colon polyps   . Thyroid disease     hypothyroidism on medications  . Transfusion history     after childbirth 60 years ago (1957)  . CHF (congestive heart failure)     hospitalized 2010  . Rash/skin eruption     multiple episodes - wide distribution-intermittent, possibly drug rash.   Past Surgical History  Procedure Laterality Date  . Appendectomy    . Abdominal hysterectomy    . Tonsillectomy    . Kidney surgery      Right  . Ankle fracture surgery      Left, pin  . Pacemaker insertion      Dr Reyes Ivan (MDT) 5.25.2010  . Breast surgery      Needle guided excision, right breast calcification  . Breast fibroadenoma surgery  2010    Right  . Skin graft      left leg    Current Outpatient Prescriptions  Medication Sig Dispense Refill  . Acetaminophen (TYLENOL EX ST ARTHRITIS PAIN PO) Take by mouth as needed. For Pain.       [name] atenolol (TENORMIN) 50 MG tablet  Take 0.5 tablets (25 mg total) by mouth 2 (two) times daily.  90 tablet  6  . atorvastatin (LIPITOR) 40 MG tablet Take 0.5 tablets (20 mg total) by mouth at bedtime.  90 tablet  3  . diltiazem (CARDIZEM CD) 180 MG 24 hr capsule Take 1 capsule (180 mg total) by mouth daily.  90 capsule  3  . dorzolamide-timolol (COSOPT) 22.3-6.8 MG/ML ophthalmic solution 1 drop as directed.        . escitalopram (LEXAPRO) 10 MG tablet       . furosemide (LASIX) 80 MG tablet Take 1 tablet (80 mg total) by mouth daily.  90 tablet  3  . losartan (COZAAR) 25 MG tablet Take 1 tablet (25 mg total) by mouth daily.  90 tablet  3  . Multiple Vitamins-Minerals (ICAPS AREDS FORMULA PO) Take by mouth.        . potassium chloride (KLOR-CON 10) 10 MEQ tablet Take 1 tablet (10 mEq total) by mouth 2 (two) times daily.  60 tablet  5  . SYNTHROID 125 MCG tablet TAKE 1 TABLET BY MOUTH DAILY  90 tablet  1  . traMADol (ULTRAM) 50 MG tablet Take 1 tablet (50 mg total) by mouth as needed. For  pain.  30 tablet  5  . travoprost, benzalkonium, (TRAVATAN) 0.004 % ophthalmic solution 1 drop as directed.        . warfarin (COUMADIN) 5 MG tablet Take as directed by coumadin clinic  40 tablet  3   No current facility-administered medications for this visit.    Allergies  Allergen Reactions  . Amoxicillin     Weakness  . Digoxin     REACTION: weakness, dehydration  . Hydrocodone     REACTION: rash  . Risedronate Sodium     REACTION: rash    History   Social History  . Marital Status: Married    Spouse Name: N/A    Number of Children: 3  . Years of Education: 13   Occupational History  . RETIRED    Social History Main Topics  . Smoking status: Never Smoker   . Smokeless tobacco: Never Used  . Alcohol Use: No  . Drug Use: No  . Sexually Active: No   Other Topics Concern  . 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  Married 1948. 3 sons - 07-04-2050, 07-04-55, 03-Jul-2056; 5 grandchildren-one deceased -  OD @ 17.  Lives alone with husband and they are independent in ADLs.  End of Life Care: no prolonged heroic measures, i.e. Artificial feeding or hydration; DNR; no prolonged intubation.    Family History  Problem Relation Age of Onset  . Cancer Mother     colon with mets  . Diabetes Mother   . Heart disease Father   . Hyperlipidemia Father   . Hypertension Father   . Stroke Brother     brother who died July 04, 2022  . Cancer Paternal Aunt     breast  . Cancer Cousin     uterine, lung cancer    Physical Exam: Filed Vitals:   04/09/12 1134  BP: 132/67  Pulse: 77  Height: 5\' 3"  (1.6 m)  Weight: 104 lb 3.2 oz (47.265 kg)    GEN- The patient is well appearing, alert and oriented x 3 today.   Head- normocephalic, atraumatic Eyes-  Sclera clear, conjunctiva pink Ears- hearing intact Oropharynx- clear Neck- supple,  Lungs- Clear to ausculation bilaterally, normal work of breathing Chest- pacemaker pocket is well healed Heart- Regular rate and rhythm (paced) GI- soft, NT, ND, + BS Extremities- no clubbing, cyanosis, 1+ edema  Pacemaker interrogation- reviewed in detail today,  See PACEART report  Assessment and Plan:  1. Bradycardia Normal pacemaker function See Pace Art report No changes today  2. afib Permanent Continue coumadin long term  Return to see the EP PA (Brooke in 1 year)

## 2012-04-09 NOTE — Patient Instructions (Addendum)
Your physician wants you to follow-up in: 12 months with Brooke Edmisten, PA  You will receive a reminder letter in the mail two months in advance. If you don't receive a letter, please call our office to schedule the follow-up appointment.     

## 2012-04-20 ENCOUNTER — Ambulatory Visit (INDEPENDENT_AMBULATORY_CARE_PROVIDER_SITE_OTHER): Payer: Medicare Other | Admitting: Cardiovascular Disease

## 2012-04-20 DIAGNOSIS — I4891 Unspecified atrial fibrillation: Secondary | ICD-10-CM

## 2012-05-06 ENCOUNTER — Ambulatory Visit (INDEPENDENT_AMBULATORY_CARE_PROVIDER_SITE_OTHER): Payer: Medicare Other | Admitting: Cardiovascular Disease

## 2012-05-06 DIAGNOSIS — I4891 Unspecified atrial fibrillation: Secondary | ICD-10-CM

## 2012-05-27 ENCOUNTER — Ambulatory Visit (INDEPENDENT_AMBULATORY_CARE_PROVIDER_SITE_OTHER): Payer: Medicare Other | Admitting: Cardiovascular Disease

## 2012-05-27 DIAGNOSIS — I4891 Unspecified atrial fibrillation: Secondary | ICD-10-CM

## 2012-06-09 ENCOUNTER — Ambulatory Visit (INDEPENDENT_AMBULATORY_CARE_PROVIDER_SITE_OTHER): Payer: Medicare Other | Admitting: Cardiology

## 2012-06-09 ENCOUNTER — Encounter: Payer: Self-pay | Admitting: Cardiology

## 2012-06-09 VITALS — BP 134/82 | HR 74 | Ht 63.0 in | Wt 104.6 lb

## 2012-06-09 DIAGNOSIS — I4891 Unspecified atrial fibrillation: Secondary | ICD-10-CM

## 2012-06-09 DIAGNOSIS — I10 Essential (primary) hypertension: Secondary | ICD-10-CM

## 2012-06-09 DIAGNOSIS — Z7901 Long term (current) use of anticoagulants: Secondary | ICD-10-CM

## 2012-06-09 DIAGNOSIS — I5032 Chronic diastolic (congestive) heart failure: Secondary | ICD-10-CM

## 2012-06-09 DIAGNOSIS — I509 Heart failure, unspecified: Secondary | ICD-10-CM

## 2012-06-09 MED ORDER — WARFARIN SODIUM 5 MG PO TABS: ORAL_TABLET | ORAL | Status: DC

## 2012-06-09 NOTE — Patient Instructions (Addendum)
Continue your current therapy  I will see you in 6 months.   

## 2012-06-13 NOTE — Progress Notes (Signed)
Laurie Larsen Date of Birth: Oct 07, 1923   History of Present Illness: Laurie Larsen is seen today for followup today. She seems to be doing well today from a cardiac standpoint. She reports that her blood pressure has been under good control. She denies any shortness of breath or increased chest pain. She recently suffered a low back strain on the left side. She is currently being treated with hydrocodone and tramadol. She is being considered for an epidural steroid injection.  Current Outpatient Prescriptions on File Prior to Visit  Medication Sig Dispense Refill  . Acetaminophen (TYLENOL EX ST ARTHRITIS PAIN PO) Take by mouth as needed. For Pain.       [name] atenolol (TENORMIN) 50 MG tablet Take 0.5 tablets (25 mg total) by mouth 2 (two) times daily.  90 tablet  6  . atorvastatin (LIPITOR) 40 MG tablet Take 0.5 tablets (20 mg total) by mouth at bedtime.  90 tablet  3  . diltiazem (CARDIZEM CD) 180 MG 24 hr capsule Take 1 capsule (180 mg total) by mouth daily.  90 capsule  3  . dorzolamide-timolol (COSOPT) 22.3-6.8 MG/ML ophthalmic solution 1 drop as directed.        . escitalopram (LEXAPRO) 10 MG tablet       . furosemide (LASIX) 80 MG tablet Take 1 tablet (80 mg total) by mouth daily.  90 tablet  3  . losartan (COZAAR) 25 MG tablet Take 1 tablet (25 mg total) by mouth daily.  90 tablet  3  . Multiple Vitamins-Minerals (ICAPS AREDS FORMULA PO) Take by mouth.        . potassium chloride (KLOR-CON 10) 10 MEQ tablet Take 1 tablet (10 mEq total) by mouth 2 (two) times daily.  60 tablet  5  . SYNTHROID 125 MCG tablet TAKE 1 TABLET BY MOUTH DAILY  90 tablet  1  . traMADol (ULTRAM) 50 MG tablet Take 1 tablet (50 mg total) by mouth as needed. For pain.  30 tablet  5  . travoprost, benzalkonium, (TRAVATAN) 0.004 % ophthalmic solution 1 drop as directed.         No current facility-administered medications on file prior to visit.    Allergies  Allergen Reactions  . Amoxicillin     Weakness  .  Digoxin     REACTION: weakness, dehydration  . Hydrocodone     REACTION: rash  . Risedronate Sodium     REACTION: rash    Past Medical History  Diagnosis Date  . Permanent atrial fibrillation   . HTN (hypertension)   . Diastolic dysfunction   . CAD (coronary artery disease)     W/occlusion of distal circumflex branch 2005  . Sick sinus syndrome     W/DDD pacemaker  . Chronic pain     ? due to arthritis  . Glaucoma(365)   . Chronic anticoagulation   . Hyperlipidemia   . Diverticulitis large intestine      by history  . Allergy   . Phlebitis     after pacemeker placement  . Colon polyps   . Thyroid disease     hypothyroidism on medications  . Transfusion history     after childbirth 60 years ago (1957)  . CHF (congestive heart failure)     hospitalized 2010  . Rash/skin eruption     multiple episodes - wide distribution-intermittent, possibly drug rash.    Past Surgical History  Procedure Laterality Date  . Appendectomy    . Abdominal hysterectomy    .  Tonsillectomy    . Kidney surgery      Right  . Ankle fracture surgery      Left, pin  . Pacemaker insertion  06/20/08    MDT EnRhythm DR implanted by Dr Reyes Ivan  . Breast surgery      Needle guided excision, right breast calcification  . Breast fibroadenoma surgery  2008/06/17    Right  . Skin graft      left leg    History  Smoking status  . Never Smoker   Smokeless tobacco  . Never Used    History  Alcohol Use No    Family History  Problem Relation Age of Onset  . Cancer Mother     colon with mets  . Diabetes Mother   . Heart disease Father   . Hyperlipidemia Father   . Hypertension Father   . Stroke Brother     brother who died 06-18-22  . Cancer Paternal Aunt     breast  . Cancer Cousin     uterine, lung cancer    Review of Systems: As noted in history of present illness.  All other systems were reviewed and are negative.  Physical Exam: BP 134/82  Pulse 74  Ht 5\' 3"  (1.6 m)  Wt 104 lb  9.6 oz (47.446 kg)  BMI 18.53 kg/m2  SpO2 95% She is an elderly white female who appears in good health. Her HEENT exam is unremarkable. She is normocephalic, atraumatic. Pupils are equal round and reactive. Sclera clear. Oropharynx is clear. Neck is without JVD or bruits. Lungs are clear. Cardiac exam reveals a regular rate and rhythm without gallop, murmur, or click. Abdomen is soft and nontender. She has no masses.  There is no lower extremity edema. LABORATORY DATA:   Assessment / Plan: 1. Atrial fibrillation. Rate is well controlled and she is on chronic anticoagulation. Continue Coumadin, atenolol, and diltiazem. If she does need an epidural injection she'll need to hold her Coumadin for 5 days.  2. Congestive heart failure secondary to diastolic dysfunction, chronic. This appears to be well compensated on her current Lasix dose.  3. Coronary disease with known occlusion of the distal left circumflex. She is asymptomatic.  4. Sick sinus syndrome status post pacemaker implant. Recent pacemaker check was satisfactory.  5. Hypertension. Well controlled today.

## 2012-06-17 ENCOUNTER — Ambulatory Visit (INDEPENDENT_AMBULATORY_CARE_PROVIDER_SITE_OTHER): Payer: Medicare Other | Admitting: Cardiology

## 2012-06-17 DIAGNOSIS — I4891 Unspecified atrial fibrillation: Secondary | ICD-10-CM

## 2012-06-24 ENCOUNTER — Ambulatory Visit (INDEPENDENT_AMBULATORY_CARE_PROVIDER_SITE_OTHER): Payer: Self-pay | Admitting: Internal Medicine

## 2012-06-24 DIAGNOSIS — I4891 Unspecified atrial fibrillation: Secondary | ICD-10-CM

## 2012-06-24 LAB — POCT INR: INR: 1.6

## 2012-06-27 DEATH — deceased

## 2012-07-01 LAB — PROTIME-INR: INR: 2.7 — AB (ref 0.9–1.1)

## 2012-07-02 ENCOUNTER — Ambulatory Visit (INDEPENDENT_AMBULATORY_CARE_PROVIDER_SITE_OTHER): Payer: Medicare Other | Admitting: Cardiology

## 2012-07-02 DIAGNOSIS — I4891 Unspecified atrial fibrillation: Secondary | ICD-10-CM

## 2012-07-20 ENCOUNTER — Ambulatory Visit (INDEPENDENT_AMBULATORY_CARE_PROVIDER_SITE_OTHER): Payer: Medicare Other | Admitting: *Deleted

## 2012-07-20 DIAGNOSIS — I4891 Unspecified atrial fibrillation: Secondary | ICD-10-CM

## 2012-08-07 ENCOUNTER — Other Ambulatory Visit: Payer: Self-pay | Admitting: Cardiology

## 2012-08-10 ENCOUNTER — Other Ambulatory Visit: Payer: Self-pay | Admitting: *Deleted

## 2012-08-10 MED ORDER — ATORVASTATIN CALCIUM 40 MG PO TABS: 20.0000 mg | ORAL_TABLET | Freq: Every day | ORAL | Status: DC

## 2012-08-12 ENCOUNTER — Ambulatory Visit (INDEPENDENT_AMBULATORY_CARE_PROVIDER_SITE_OTHER): Payer: Medicare Other | Admitting: Cardiovascular Disease

## 2012-08-12 DIAGNOSIS — I4891 Unspecified atrial fibrillation: Secondary | ICD-10-CM

## 2012-08-25 ENCOUNTER — Telehealth: Payer: Self-pay | Admitting: Cardiology

## 2012-08-25 NOTE — Telephone Encounter (Signed)
New Prob  Pt states her legs are swollen.  She said that she has been taking her Lasix and it goes down some but not completely.

## 2012-08-25 NOTE — Telephone Encounter (Signed)
Returned call to patient she stated she has been having swelling in lower legs for the past 2 weeks.Stated sob is no worse,no weight gain.Stated she is following a low salt diet.Stated she is taking Lasix 80 mg daily and it don't seem to be helping much.Dr.Jordan out of office today will send message to him for advice.

## 2012-08-26 NOTE — Telephone Encounter (Signed)
She can take an extra 40 mg of lasix for 2 days then continue her prior therapy.  Jader Desai [place] MD, [facility]

## 2012-08-26 NOTE — Telephone Encounter (Signed)
Returned call to patient Dr.Jordan advised to take a extra lasix for 2 days only then continue prior therapy.Advised to call back if not better.

## 2012-09-08 LAB — PROTIME-INR: INR: 2.5 — AB (ref 0.9–1.1)

## 2012-09-09 ENCOUNTER — Ambulatory Visit (INDEPENDENT_AMBULATORY_CARE_PROVIDER_SITE_OTHER): Payer: Medicare Other | Admitting: Cardiovascular Disease

## 2012-09-09 DIAGNOSIS — I4891 Unspecified atrial fibrillation: Secondary | ICD-10-CM

## 2012-09-21 ENCOUNTER — Other Ambulatory Visit: Payer: Self-pay

## 2012-09-21 DIAGNOSIS — Z1231 Encounter for screening mammogram for malignant neoplasm of breast: Secondary | ICD-10-CM

## 2012-09-30 ENCOUNTER — Other Ambulatory Visit: Payer: Self-pay

## 2012-09-30 DIAGNOSIS — I10 Essential (primary) hypertension: Secondary | ICD-10-CM

## 2012-09-30 MED ORDER — ATENOLOL 50 MG PO TABS: 25.0000 mg | ORAL_TABLET | Freq: Two times a day (BID) | ORAL | Status: DC

## 2012-09-30 MED ORDER — LOSARTAN POTASSIUM 25 MG PO TABS: 25.0000 mg | ORAL_TABLET | Freq: Every day | ORAL | Status: DC

## 2012-10-06 ENCOUNTER — Ambulatory Visit (INDEPENDENT_AMBULATORY_CARE_PROVIDER_SITE_OTHER): Payer: Medicare Other | Admitting: *Deleted

## 2012-10-06 DIAGNOSIS — I4891 Unspecified atrial fibrillation: Secondary | ICD-10-CM

## 2012-10-06 DIAGNOSIS — I495 Sick sinus syndrome: Secondary | ICD-10-CM

## 2012-10-06 LAB — PACEMAKER DEVICE OBSERVATION
BMOD-0002RV: 7
BRDY-0004RV: 130 {beats}/min
RV LEAD IMPEDENCE PM: 448 Ohm
RV LEAD THRESHOLD: 1 V
VENTRICULAR PACING PM: 57.64

## 2012-10-06 NOTE — Progress Notes (Signed)
Pt noticing increase in dyspnea since last visit. She also c/o sharp pains under left breast. Pt did not have sensations when performing RV lead threshold test. All device functions normal, no changes made, full details in PaceArt.    Spoke w/ Dr. Johney Frame about symptoms above---offered pt to make appt w/ Lawson Fiscal but pt prefers to wait to see Dr. [place] 12/08/12.  ROV for device w/ Dr. Johney Frame Mar/2015.

## 2012-10-11 ENCOUNTER — Ambulatory Visit (INDEPENDENT_AMBULATORY_CARE_PROVIDER_SITE_OTHER): Payer: Medicare Other | Admitting: Cardiology

## 2012-10-11 DIAGNOSIS — I4891 Unspecified atrial fibrillation: Secondary | ICD-10-CM

## 2012-10-14 ENCOUNTER — Ambulatory Visit
Admission: RE | Admit: 2012-10-14 | Discharge: 2012-10-14 | Disposition: A | Discharge status: Home / Self Care | Payer: Medicare Other | Source: Ambulatory Visit

## 2012-10-14 DIAGNOSIS — Z1231 Encounter for screening mammogram for malignant neoplasm of breast: Secondary | ICD-10-CM

## 2012-10-21 ENCOUNTER — Other Ambulatory Visit: Payer: Self-pay | Admitting: Family Medicine

## 2012-10-21 ENCOUNTER — Other Ambulatory Visit: Payer: Self-pay | Admitting: Otolaryngology

## 2012-10-21 DIAGNOSIS — R9389 Abnormal findings on diagnostic imaging of other specified body structures: Secondary | ICD-10-CM

## 2012-10-25 ENCOUNTER — Ambulatory Visit
Admission: RE | Admit: 2012-10-25 | Discharge: 2012-10-25 | Disposition: A | Discharge status: Home / Self Care | Payer: BLUE CROSS/BLUE SHIELD | Source: Ambulatory Visit | Attending: Family Medicine | Admitting: Family Medicine

## 2012-10-25 DIAGNOSIS — R9389 Abnormal findings on diagnostic imaging of other specified body structures: Secondary | ICD-10-CM

## 2012-10-25 MED ORDER — IOHEXOL 300 MG/ML  SOLN
75.0000 mL | Freq: Once | INTRAMUSCULAR | Status: AC | PRN
  Administered 2012-10-25: 75 mL via INTRAVENOUS

## 2012-11-01 ENCOUNTER — Encounter: Payer: Self-pay | Admitting: Internal Medicine

## 2012-11-08 ENCOUNTER — Ambulatory Visit (INDEPENDENT_AMBULATORY_CARE_PROVIDER_SITE_OTHER): Payer: Medicare Other | Admitting: Critical Care Medicine

## 2012-11-08 ENCOUNTER — Encounter: Payer: Self-pay | Admitting: Critical Care Medicine

## 2012-11-08 VITALS — BP 126/86 | HR 80 | Temp 97.8°F | Ht 64.0 in | Wt 105.0 lb

## 2012-11-08 DIAGNOSIS — J90 Pleural effusion, not elsewhere classified: Secondary | ICD-10-CM

## 2012-11-08 NOTE — Progress Notes (Signed)
Subjective:    Patient ID: Laurie Larsen, female    DOB: 1923-08-14, 79 y.o.   MRN: [id number]  HPI  79 y.o.F referred for abn Xray Pt dx with PNA two months ago. Rx abx.  Symptoms weakness, no real cough. Did note some dyspnea sitting still or with exertion.  Noteds some chills, no fever.  No real wheeze.  Weight is down slightly.  No fever now. No real change in weakness. Pt with CT Scan shows fluid in RLL area.   Past Medical History  Diagnosis Date  . Permanent atrial fibrillation   . HTN (hypertension)   . Diastolic dysfunction   . CAD (coronary artery disease)     W/occlusion of distal circumflex branch 06-20-2003  . Sick sinus syndrome     W/DDD pacemaker  . Chronic pain     ? due to arthritis  . Glaucoma   . Chronic anticoagulation   . Hyperlipidemia   . Diverticulitis large intestine      by history  . Allergy   . Phlebitis     after pacemeker placement  . Colon polyps   . Thyroid disease     hypothyroidism on medications  . Transfusion history     after childbirth 62 years ago 06/20/55)  . CHF (congestive heart failure)     hospitalized 06-19-08  . Rash/skin eruption     multiple episodes - wide distribution-intermittent, possibly drug rash.     Family History  Problem Relation Age of Onset  . Cancer Mother     colon with mets  . Diabetes Mother   . Heart disease Father   . Hyperlipidemia Father   . Hypertension Father   . Stroke Brother     brother who died 2022/06/21  . Cancer Paternal Aunt     breast  . Cancer Cousin     uterine, lung cancer     History   Social History  . Marital Status: Married    Spouse Name: N/A    Number of Children: 3  . Years of Education: 13   Occupational History  . RETIRED    Social History Main Topics  . Smoking status: Never Smoker   . Smokeless tobacco: Never Used  . Alcohol Use: No  . Drug Use: No  . Sexual Activity: No   Other Topics Concern  . 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  Married 1948. 3 sons - 2050/06/20, 2055/06/20, 06-19-2056; 5 grandchildren-one deceased -  OD @ 41.  Lives alone with husband and they are independent in ADLs.  End of Life Care: no prolonged heroic measures, i.e. Artificial feeding or hydration; DNR; no prolonged intubation.     Allergies  Allergen Reactions  . Amoxicillin     Weakness  . Digoxin     REACTION: weakness, dehydration  . Risedronate Sodium     REACTION: rash     Outpatient Prescriptions Prior to Visit  Medication Sig Dispense Refill  . Acetaminophen (TYLENOL EX ST ARTHRITIS PAIN PO) Take by mouth as needed. For Pain.       [name] atenolol (TENORMIN) 50 MG tablet Take 0.5 tablets (25 mg total) by mouth 2 (two) times daily.  90 tablet  6  . atorvastatin (LIPITOR) 40 MG tablet Take 0.5 tablets (20 mg total) by mouth at bedtime.  90 tablet  3  .  diltiazem (CARDIZEM CD) 180 MG 24 hr capsule Take 1 capsule (180 mg total) by mouth daily.  90 capsule  3  . dorzolamide-timolol (COSOPT) 22.3-6.8 MG/ML ophthalmic solution 1 drop as directed.        . escitalopram (LEXAPRO) 10 MG tablet       . furosemide (LASIX) 80 MG tablet Take 1 tablet (80 mg total) by mouth daily.  90 tablet  3  . losartan (COZAAR) 25 MG tablet Take 1 tablet (25 mg total) by mouth daily.  90 tablet  3  . Multiple Vitamins-Minerals (ICAPS AREDS FORMULA PO) Take by mouth.        . potassium chloride (KLOR-CON 10) 10 MEQ tablet Take 1 tablet (10 mEq total) by mouth 2 (two) times daily.  60 tablet  5  . SYNTHROID 125 MCG tablet TAKE 1 TABLET BY MOUTH DAILY  90 tablet  1  . traMADol (ULTRAM) 50 MG tablet Take 1 tablet (50 mg total) by mouth as needed. For pain.  30 tablet  5  . travoprost, benzalkonium, (TRAVATAN) 0.004 % ophthalmic solution 1 drop as directed.        . warfarin (COUMADIN) 5 MG tablet AS DIRECTED  40 tablet  3  . warfarin (COUMADIN) 5 MG tablet Take as directed by coumadin clinic  120 tablet   3   No facility-administered medications prior to visit.     Review of Systems  Constitutional: Positive for appetite change and unexpected weight change. Negative for fever, chills, diaphoresis, activity change and fatigue.  HENT: Positive for congestion, postnasal drip, sinus pressure and sneezing. Negative for dental problem, ear discharge, ear pain, facial swelling, hearing loss, mouth sores, nosebleeds, rhinorrhea, sore throat, tinnitus, trouble swallowing and voice change.   Eyes: Negative for photophobia, discharge, itching and visual disturbance.  Respiratory: Positive for cough, chest tightness and shortness of breath. Negative for apnea, choking, wheezing and stridor.   Cardiovascular: Positive for palpitations. Negative for chest pain and leg swelling.  Gastrointestinal: Positive for abdominal pain. Negative for nausea, vomiting, constipation, blood in stool and abdominal distention.  Genitourinary: Negative for dysuria, urgency, frequency, hematuria, flank pain, decreased urine volume and difficulty urinating.  Musculoskeletal: Negative for arthralgias, back pain, gait problem, joint swelling, myalgias, neck pain and neck stiffness.  Skin: Negative for color change, pallor and rash.  Neurological: Negative for dizziness, tremors, seizures, syncope, speech difficulty, weakness, light-headedness, numbness and headaches.  Hematological: Negative for adenopathy. Does not bruise/bleed easily.  Psychiatric/Behavioral: Positive for dysphoric mood. Negative for confusion, sleep disturbance and agitation. The patient is nervous/anxious.        Objective:   Physical Exam Filed Vitals:   11/08/12 1601  BP: 126/86  Pulse: 80  Temp: 97.8 F (36.6 C)  TempSrc: Oral  Height: 5\' 4"  (1.626 m)  Weight: 105 lb (47.628 kg)  SpO2: 94%    Gen: Pleasant, well-nourished, in no distress,  normal affect  ENT: No lesions,  mouth clear,  oropharynx clear, no postnasal drip  Neck: No JVD, no  TMG, no carotid bruits  Lungs: No use of accessory muscles, no dullness to percussion, decreased BS on R 1/2 way up  Cardiovascular: RRR, heart sounds normal, no murmur or gallops, no peripheral edema  Abdomen: soft and NT, no HSM,  BS normal  Musculoskeletal: No deformities, no cyanosis or clubbing  Neuro: alert, non focal  Skin: Warm, no lesions or rashes  No results found.  All imaging reviewed       Assessment &  Plan:   Pleural effusion R pleural effusion ?parapneumonic vs CHF /transudate Plan An outpatient thoracentesis will be scheduled for Monday October 20 at [facility]  Hold coumadin starting on Thursday October 16 Return one month    Updated Medication List Outpatient Encounter Prescriptions as of 11/08/2012  Medication Sig Dispense Refill  . Acetaminophen (TYLENOL EX ST ARTHRITIS PAIN PO) Take by mouth as needed. For Pain.       [name] atenolol (TENORMIN) 50 MG tablet Take 0.5 tablets (25 mg total) by mouth 2 (two) times daily.  90 tablet  6  . atorvastatin (LIPITOR) 40 MG tablet Take 0.5 tablets (20 mg total) by mouth at bedtime.  90 tablet  3  . diltiazem (CARDIZEM CD) 180 MG 24 hr capsule Take 1 capsule (180 mg total) by mouth daily.  90 capsule  3  . dorzolamide-timolol (COSOPT) 22.3-6.8 MG/ML ophthalmic solution 1 drop as directed.        . escitalopram (LEXAPRO) 10 MG tablet       . furosemide (LASIX) 80 MG tablet Take 1 tablet (80 mg total) by mouth daily.  90 tablet  3  . HYDROcodone-acetaminophen (NORCO/VICODIN) 5-325 MG per tablet Take 1 tablet by mouth as needed.      [name] losartan (COZAAR) 25 MG tablet Take 1 tablet (25 mg total) by mouth daily.  90 tablet  3  . Multiple Vitamins-Minerals (ICAPS AREDS FORMULA PO) Take by mouth.        . potassium chloride (KLOR-CON 10) 10 MEQ tablet Take 1 tablet (10 mEq total) by mouth 2 (two) times daily.  60 tablet  5  . SYNTHROID 125 MCG tablet TAKE 1 TABLET BY MOUTH DAILY  90 tablet  1  . traMADol  (ULTRAM) 50 MG tablet Take 1 tablet (50 mg total) by mouth as needed. For pain.  30 tablet  5  . travoprost, benzalkonium, (TRAVATAN) 0.004 % ophthalmic solution 1 drop as directed.        . warfarin (COUMADIN) 5 MG tablet AS DIRECTED  40 tablet  3  . [DISCONTINUED] potassium chloride (K-DUR,KLOR-CON) 10 MEQ tablet Take 1 tablet by mouth 2 (two) times daily.      . [DISCONTINUED] warfarin (COUMADIN) 5 MG tablet Take as directed by coumadin clinic  120 tablet  3   No facility-administered encounter medications on file as of 11/08/2012.

## 2012-11-08 NOTE — Patient Instructions (Signed)
An outpatient thoracentesis will be scheduled for Monday October 20 at [facility]  Hold coumadin starting on Thursday October 16 Return one month

## 2012-11-10 LAB — PROTIME-INR: INR: 3 — AB (ref 0.9–1.1)

## 2012-11-10 NOTE — Assessment & Plan Note (Signed)
R pleural effusion ?parapneumonic vs CHF /transudate Plan An outpatient thoracentesis will be scheduled for Monday October 20 at [facility]  Hold coumadin starting on Thursday October 16 Return one month

## 2012-11-11 ENCOUNTER — Ambulatory Visit (INDEPENDENT_AMBULATORY_CARE_PROVIDER_SITE_OTHER): Payer: Medicare Other | Admitting: Pharmacist

## 2012-11-11 DIAGNOSIS — I4891 Unspecified atrial fibrillation: Secondary | ICD-10-CM

## 2012-11-15 ENCOUNTER — Ambulatory Visit (HOSPITAL_COMMUNITY)
Admission: RE | Admit: 2012-11-15 | Discharge: 2012-11-15 | Disposition: A | Discharge status: Home / Self Care | Payer: Medicare Other | Source: Ambulatory Visit | Attending: Critical Care Medicine | Admitting: Critical Care Medicine

## 2012-11-15 ENCOUNTER — Ambulatory Visit (HOSPITAL_COMMUNITY)
Admission: RE | Admit: 2012-11-15 | Discharge: 2012-11-15 | Disposition: A | Discharge status: Home / Self Care | Payer: Medicare Other | Source: Ambulatory Visit | Attending: Radiology | Admitting: Radiology

## 2012-11-15 DIAGNOSIS — J90 Pleural effusion, not elsewhere classified: Secondary | ICD-10-CM | POA: Insufficient documentation

## 2012-11-15 LAB — LACTATE DEHYDROGENASE, PLEURAL OR PERITONEAL FLUID: LD, Fluid: 83 U/L — ABNORMAL HIGH (ref 3–23)

## 2012-11-15 LAB — BODY FLUID CELL COUNT WITH DIFFERENTIAL
Neutrophil Count, Fluid: 17 % (ref 0–25)
Total Nucleated Cell Count, Fluid: 497 cu mm (ref 0–1000)

## 2012-11-15 LAB — PROTEIN, BODY FLUID: Total protein, fluid: 2.9 g/dL

## 2012-11-15 LAB — GLUCOSE, SEROUS FLUID: Glucose, Fluid: 101 mg/dL

## 2012-11-15 NOTE — Procedures (Signed)
Successful US guided right thoracentesis. Yielded 1.4L of clear yellow fluid. Pt tolerated procedure well. No immediate complications.  Specimen was sent for labs. CXR ordered.  Brayton El PA-C 11/15/2012 11:36 AM

## 2012-11-18 LAB — BODY FLUID CULTURE

## 2012-11-26 ENCOUNTER — Ambulatory Visit (INDEPENDENT_AMBULATORY_CARE_PROVIDER_SITE_OTHER): Payer: Medicare Other | Admitting: Pharmacist

## 2012-11-26 DIAGNOSIS — I4891 Unspecified atrial fibrillation: Secondary | ICD-10-CM

## 2012-12-08 ENCOUNTER — Ambulatory Visit (INDEPENDENT_AMBULATORY_CARE_PROVIDER_SITE_OTHER): Payer: Medicare Other | Admitting: Cardiology

## 2012-12-08 ENCOUNTER — Telehealth: Payer: Self-pay | Admitting: *Deleted

## 2012-12-08 ENCOUNTER — Encounter: Payer: Self-pay | Admitting: Cardiology

## 2012-12-08 VITALS — BP 118/72 | HR 71 | Ht 64.0 in | Wt 102.0 lb

## 2012-12-08 DIAGNOSIS — I5032 Chronic diastolic (congestive) heart failure: Secondary | ICD-10-CM

## 2012-12-08 DIAGNOSIS — I10 Essential (primary) hypertension: Secondary | ICD-10-CM

## 2012-12-08 DIAGNOSIS — I509 Heart failure, unspecified: Secondary | ICD-10-CM

## 2012-12-08 DIAGNOSIS — I495 Sick sinus syndrome: Secondary | ICD-10-CM

## 2012-12-08 DIAGNOSIS — J90 Pleural effusion, not elsewhere classified: Secondary | ICD-10-CM

## 2012-12-08 DIAGNOSIS — I4891 Unspecified atrial fibrillation: Secondary | ICD-10-CM

## 2012-12-08 DIAGNOSIS — Z7901 Long term (current) use of anticoagulants: Secondary | ICD-10-CM

## 2012-12-08 NOTE — Progress Notes (Signed)
Laurie Larsen Date of Birth: 1923-02-17   History of Present Illness: Laurie Larsen is seen today for followup today. She reports a diagnosis of pneumonia in late July. Following that she had persistent shortness of breath and followup chest x-ray and CT showed evidence of a moderately large right pleural effusion. She underwent thoracentesis. Evaluation suggested transitive process with some reactive mesothelial cells. She states her breathing improved dramatically after her thoracentesis. In late July she did experience some increased swelling in her legs that improved with a few days of extra Lasix. She states now her swelling is doing quite well. She did have an episode 2 weeks ago where she had a severe sharp pain radiating from her abdomen and lower chest around her flank into the back. She states it was hard for her to take a deep breath. This lasted 10 or 15 minutes and then resolved. It has not recurred since then.  Current Outpatient Prescriptions on File Prior to Visit  Medication Sig Dispense Refill  . Acetaminophen (TYLENOL EX ST ARTHRITIS PAIN PO) Take by mouth as needed. For Pain.       [name] atenolol (TENORMIN) 50 MG tablet Take 0.5 tablets (25 mg total) by mouth 2 (two) times daily.  90 tablet  6  . atorvastatin (LIPITOR) 40 MG tablet Take 0.5 tablets (20 mg total) by mouth at bedtime.  90 tablet  3  . diltiazem (CARDIZEM CD) 180 MG 24 hr capsule Take 1 capsule (180 mg total) by mouth daily.  90 capsule  3  . dorzolamide-timolol (COSOPT) 22.3-6.8 MG/ML ophthalmic solution 1 drop as directed.        . escitalopram (LEXAPRO) 10 MG tablet       . furosemide (LASIX) 80 MG tablet Take 1 tablet (80 mg total) by mouth daily.  90 tablet  3  . HYDROcodone-acetaminophen (NORCO/VICODIN) 5-325 MG per tablet Take 1 tablet by mouth as needed.      [name] losartan (COZAAR) 25 MG tablet Take 1 tablet (25 mg total) by mouth daily.  90 tablet  3  . Multiple Vitamins-Minerals (ICAPS AREDS FORMULA PO) Take  by mouth.        . potassium chloride (KLOR-CON 10) 10 MEQ tablet Take 1 tablet (10 mEq total) by mouth 2 (two) times daily.  60 tablet  5  . SYNTHROID 125 MCG tablet TAKE 1 TABLET BY MOUTH DAILY  90 tablet  1  . traMADol (ULTRAM) 50 MG tablet Take 1 tablet (50 mg total) by mouth as needed. For pain.  30 tablet  5  . travoprost, benzalkonium, (TRAVATAN) 0.004 % ophthalmic solution 1 drop as directed.        . warfarin (COUMADIN) 5 MG tablet AS DIRECTED  40 tablet  3   No current facility-administered medications on file prior to visit.    Allergies  Allergen Reactions  . Amoxicillin     Weakness  . Digoxin     REACTION: weakness, dehydration  . Risedronate Sodium     REACTION: rash    Past Medical History  Diagnosis Date  . Permanent atrial fibrillation   . HTN (hypertension)   . Diastolic dysfunction   . CAD (coronary artery disease)     W/occlusion of distal circumflex branch 2005  . Sick sinus syndrome     W/DDD pacemaker  . Chronic pain     ? due to arthritis  . Glaucoma   . Chronic anticoagulation   . Hyperlipidemia   . Diverticulitis  large intestine      by history  . Allergy   . Phlebitis     after pacemeker placement  . Colon polyps   . Thyroid disease     hypothyroidism on medications  . Transfusion history     after childbirth 60 years ago (1957)  . CHF (congestive heart failure)     hospitalized 2010  . Rash/skin eruption     multiple episodes - wide distribution-intermittent, possibly drug rash.    Past Surgical History  Procedure Laterality Date  . Appendectomy    . Abdominal hysterectomy    . Tonsillectomy    . Kidney surgery      Right  . Ankle fracture surgery      Left, pin  . Pacemaker insertion  06/20/08    MDT EnRhythm DR implanted by Dr Reyes Ivan  . Breast surgery      Needle guided excision, right breast calcification  . Breast fibroadenoma surgery  2010    Right  . Skin graft      left leg    History  Smoking status  . Never  Smoker   Smokeless tobacco  . Never Used    History  Alcohol Use No    Family History  Problem Relation Age of Onset  . Cancer Mother     colon with mets  . Diabetes Mother   . Heart disease Father   . Hyperlipidemia Father   . Hypertension Father   . Stroke Brother     brother who died 06-23-22  . Cancer Paternal Aunt     breast  . Cancer Cousin     uterine, lung cancer    Review of Systems: As noted in history of present illness.  All other systems were reviewed and are negative.  Physical Exam: BP 118/72  Pulse 71  Ht 5\' 4"  (1.626 m)  Wt 102 lb (46.267 kg)  BMI 17.50 kg/m2 She is an elderly white female who appears in good health. Her HEENT exam is unremarkable. She is normocephalic, atraumatic. Pupils are equal round and reactive. Sclera clear. Oropharynx is clear. Neck is without JVD or bruits. Lungs are clear. Cardiac exam reveals a regular rate and rhythm without gallop, murmur, or click. Abdomen is soft and nontender. She has no masses.  There is trace extremity edema.  LABORATORY DATA: ECG today demonstrates an underlying rhythm of atrial fibrillation. She has ventricular pacing a rate of 71 beats per minute.  Assessment / Plan: 1. Atrial fibrillation. Rate is well controlled and she is on chronic anticoagulation. Continue Coumadin, atenolol, and diltiazem.   2. Congestive heart failure secondary to diastolic dysfunction, chronic. This appears to be well compensated on her current Lasix dose.  3. Coronary disease with known occlusion of the distal left circumflex. She is asymptomatic.  4. Sick sinus syndrome status post pacemaker implant. Recent pacemaker check was satisfactory.  5. Hypertension. Well controlled today.  6. Right pleural effusion. Status post thoracentesis. Clinically he is doing well. This may have been related to a parapneumonic process or diastolic heart failure. I did instruct her that if she noted increase in edema or weight gain she can take  an extra dose of Lasix.

## 2012-12-08 NOTE — Patient Instructions (Signed)
Continue your current therapy and sodium restriction  If you have more swelling or weight gain you may take an extra 1/2 to 1 lasix tablets.   I will see you in 6 months.

## 2012-12-08 NOTE — Telephone Encounter (Signed)
Pt has a pending OV with PW on Nov 21 a 2:30 pm.  PW will be out of the office at this time.  I have lmomtcb to reschedule this appt with pt.

## 2012-12-09 NOTE — Telephone Encounter (Signed)
Pt returned call.  Rescheduled appt w/ PW for 12/17/12 at 10:30 AM.  Laurie Larsen

## 2012-12-09 NOTE — Telephone Encounter (Signed)
ATC pt's home # - received fast busy signal. lmomtcb on pt's cell #

## 2012-12-17 ENCOUNTER — Ambulatory Visit (INDEPENDENT_AMBULATORY_CARE_PROVIDER_SITE_OTHER): Payer: Medicare Other | Admitting: Critical Care Medicine

## 2012-12-17 ENCOUNTER — Encounter: Payer: Self-pay | Admitting: Critical Care Medicine

## 2012-12-17 ENCOUNTER — Ambulatory Visit: Payer: Medicare Other | Admitting: Critical Care Medicine

## 2012-12-17 ENCOUNTER — Ambulatory Visit (INDEPENDENT_AMBULATORY_CARE_PROVIDER_SITE_OTHER)
Admission: RE | Admit: 2012-12-17 | Discharge: 2012-12-17 | Disposition: A | Discharge status: Home / Self Care | Payer: Medicare Other | Source: Ambulatory Visit | Attending: Critical Care Medicine | Admitting: Critical Care Medicine

## 2012-12-17 VITALS — BP 116/76 | HR 96 | Ht 64.0 in | Wt 102.0 lb

## 2012-12-17 DIAGNOSIS — J90 Pleural effusion, not elsewhere classified: Secondary | ICD-10-CM

## 2012-12-17 NOTE — Patient Instructions (Signed)
Chest xray will be obtained No further medication changes Return 4 months

## 2012-12-17 NOTE — Progress Notes (Signed)
Quick Note:  lmomtcb for pt ______ 

## 2012-12-17 NOTE — Progress Notes (Signed)
Subjective:    Patient ID: Laurie Larsen, female    DOB: 1923-03-17, 79 y.o.   MRN: [id number]  HPI   79 y.o.F referred for abn Xray Pt dx with PNA two months ago. Rx abx.  Symptoms weakness, no real cough. Did note some dyspnea sitting still or with exertion.  Noteds some chills, no fever.  No real wheeze.  Weight is down slightly.  No fever now. No real change in weakness. Pt with CT Scan shows fluid in RLL area.   12/17/2012. Chief Complaint  Patient presents with  . Follow-up    Post thoracentesis.  Pt c/o SOB with any exertion.  Pt denies cough, wheezing.  Pt still with sharp stabbing pain on the L side of chest and still with DOE.  77.4 L of pleural fluid removed on the R 11/08/12 and studies were neg for exudate, was a transudate   Past Medical History  Diagnosis Date  . Permanent atrial fibrillation   . HTN (hypertension)   . Diastolic dysfunction   . CAD (coronary artery disease)     W/occlusion of distal circumflex branch 07-01-03  . Sick sinus syndrome     W/DDD pacemaker  . Chronic pain     ? due to arthritis  . Glaucoma   . Chronic anticoagulation   . Hyperlipidemia   . Diverticulitis large intestine      by history  . Allergy   . Phlebitis     after pacemeker placement  . Colon polyps   . Thyroid disease     hypothyroidism on medications  . Transfusion history     after childbirth 62 years ago 07[birth year]01[birth year]55)  . CHF (congestive heart failure)     hospitalized 06-30-08  . Rash/skin eruption     multiple episodes - wide distribution-intermittent, possibly drug rash.     Family History  Problem Relation Age of Onset  . Cancer Mother     colon with mets  . Diabetes Mother   . Heart disease Father   . Hyperlipidemia Father   . Hypertension Father   . Stroke Brother     brother who died 07-02-2022  . Cancer Paternal Aunt     breast  . Cancer Cousin     uterine, lung cancer     History   Social History  . Marital Status: Married    Spouse Name: N/A    Number of  Children: 3  . Years of Education: 13   Occupational History  . RETIRED    Social History Main Topics  . Smoking status: Never Smoker   . Smokeless tobacco: Never Used  . Alcohol Use: No  . Drug Use: No  . Sexual Activity: No   Other Topics Concern  . 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  Married 1948. 3 sons - 07-01-2050, Jul 01, 2055, 06-30-2056; 5 grandchildren-one deceased -  OD @ 18.  Lives alone with husband and they are independent in ADLs.  End of Life Care: no prolonged heroic measures, i.e. Artificial feeding or hydration; DNR; no prolonged intubation.     Allergies  Allergen Reactions  . Amoxicillin     Weakness  . Digoxin     REACTION: weakness, dehydration  . Risedronate Sodium     REACTION: rash     Outpatient Prescriptions Prior to Visit  Medication Sig Dispense Refill  .  Acetaminophen (TYLENOL EX ST ARTHRITIS PAIN PO) Take by mouth as needed. For Pain.       [name] atenolol (TENORMIN) 50 MG tablet Take 0.5 tablets (25 mg total) by mouth 2 (two) times daily.  90 tablet  6  . atorvastatin (LIPITOR) 40 MG tablet Take 0.5 tablets (20 mg total) by mouth at bedtime.  90 tablet  3  . diltiazem (CARDIZEM CD) 180 MG 24 hr capsule Take 1 capsule (180 mg total) by mouth daily.  90 capsule  3  . dorzolamide-timolol (COSOPT) 22.3-6.8 MG/ML ophthalmic solution 1 drop as directed.        . escitalopram (LEXAPRO) 10 MG tablet       . furosemide (LASIX) 80 MG tablet Take 1 tablet (80 mg total) by mouth daily.  90 tablet  3  . HYDROcodone-acetaminophen (NORCO/VICODIN) 5-325 MG per tablet Take 1 tablet by mouth as needed.      [name] losartan (COZAAR) 25 MG tablet Take 1 tablet (25 mg total) by mouth daily.  90 tablet  3  . Multiple Vitamins-Minerals (ICAPS AREDS FORMULA PO) Take by mouth.        . potassium chloride (KLOR-CON 10) 10 MEQ tablet Take 1 tablet (10 mEq total) by mouth 2 (two) times  daily.  60 tablet  5  . SYNTHROID 125 MCG tablet TAKE 1 TABLET BY MOUTH DAILY  90 tablet  1  . traMADol (ULTRAM) 50 MG tablet Take 1 tablet (50 mg total) by mouth as needed. For pain.  30 tablet  5  . travoprost, benzalkonium, (TRAVATAN) 0.004 % ophthalmic solution 1 drop as directed.        . warfarin (COUMADIN) 5 MG tablet AS DIRECTED  40 tablet  3   No facility-administered medications prior to visit.     Review of Systems  Constitutional: Positive for appetite change and unexpected weight change. Negative for fever, chills, diaphoresis, activity change and fatigue.  HENT: Positive for congestion, postnasal drip, sinus pressure and sneezing. Negative for dental problem, ear discharge, ear pain, facial swelling, hearing loss, mouth sores, nosebleeds, rhinorrhea, sore throat, tinnitus, trouble swallowing and voice change.   Eyes: Negative for photophobia, discharge, itching and visual disturbance.  Respiratory: Positive for cough, chest tightness and shortness of breath. Negative for apnea, choking, wheezing and stridor.   Cardiovascular: Positive for palpitations. Negative for chest pain and leg swelling.  Gastrointestinal: Positive for abdominal pain. Negative for nausea, vomiting, constipation, blood in stool and abdominal distention.  Genitourinary: Negative for dysuria, urgency, frequency, hematuria, flank pain, decreased urine volume and difficulty urinating.  Musculoskeletal: Negative for arthralgias, back pain, gait problem, joint swelling, myalgias, neck pain and neck stiffness.  Skin: Negative for color change, pallor and rash.  Neurological: Negative for dizziness, tremors, seizures, syncope, speech difficulty, weakness, light-headedness, numbness and headaches.  Hematological: Negative for adenopathy. Does not bruise/bleed easily.  Psychiatric/Behavioral: Positive for dysphoric mood. Negative for confusion, sleep disturbance and agitation. The patient is nervous/anxious.         Objective:   Physical Exam  Filed Vitals:   12/17/12 1108  BP: 116/76  Pulse: 96  Height: 5\' 4"  (1.626 m)  Weight: 102 lb (46.267 kg)  SpO2: 95%    Gen: Pleasant, well-nourished, in no distress,  normal affect  ENT: No lesions,  mouth clear,  oropharynx clear, no postnasal drip  Neck: No JVD, no TMG, no carotid bruits  Lungs: No use of accessory muscles, no dullness to percussion, improved BS on R  Cardiovascular: RRR, heart sounds normal, no murmur or gallops, no peripheral edema  Abdomen: soft and NT, no HSM,  BS normal  Musculoskeletal: No deformities, no cyanosis or clubbing  Neuro: alert, non focal  Skin: Warm, no lesions or rashes  Dg Chest 2 View  12/17/2012   CLINICAL DATA:  Right pleural effusion.  EXAM: CHEST  2 VIEW  COMPARISON:  11/15/2012.  FINDINGS: There is a small right pleural effusion. There is a spiculated appearance of the right hilum which may reflect scarring versus atelectasis. The left lung is clear. The lungs are hyperinflated likely secondary to COPD. The heart size is mildly enlarged. There is a dual lead cardiac pacer.  IMPRESSION: Small right pleural effusion, increased compared with 11/15/2012.   Electronically Signed   By: Elige Ko   On: 12/17/2012 13:49    All imaging reviewed       Assessment & Plan:   Pleural effusion R transudative pleural effusion RESolved s/p tap Plan Keep pt euvolemic No further pulm f/u or w/u needed Prn return    Updated Medication List Outpatient Encounter Prescriptions as of 12/17/2012  Medication Sig  . Acetaminophen (TYLENOL EX ST ARTHRITIS PAIN PO) Take by mouth as needed. For Pain.   [name] atenolol (TENORMIN) 50 MG tablet Take 0.5 tablets (25 mg total) by mouth 2 (two) times daily.  [name] atorvastatin (LIPITOR) 40 MG tablet Take 0.5 tablets (20 mg total) by mouth at bedtime.  [name] diltiazem (CARDIZEM CD) 180 MG 24 hr capsule Take 1 capsule (180 mg total) by mouth daily.  . dorzolamide-timolol (COSOPT)  22.3-6.8 MG/ML ophthalmic solution 1 drop as directed.    . escitalopram (LEXAPRO) 10 MG tablet   . furosemide (LASIX) 80 MG tablet Take 1 tablet (80 mg total) by mouth daily.  [name] HYDROcodone-acetaminophen (NORCO/VICODIN) 5-325 MG per tablet Take 1 tablet by mouth as needed.  [name] losartan (COZAAR) 25 MG tablet Take 1 tablet (25 mg total) by mouth daily.  . Multiple Vitamins-Minerals (ICAPS AREDS FORMULA PO) Take by mouth.    . potassium chloride (KLOR-CON 10) 10 MEQ tablet Take 1 tablet (10 mEq total) by mouth 2 (two) times daily.  [name] SYNTHROID 125 MCG tablet TAKE 1 TABLET BY MOUTH DAILY  . traMADol (ULTRAM) 50 MG tablet Take 1 tablet (50 mg total) by mouth as needed. For pain.  [name] travoprost, benzalkonium, (TRAVATAN) 0.004 % ophthalmic solution 1 drop as directed.    . warfarin (COUMADIN) 5 MG tablet AS DIRECTED

## 2012-12-18 NOTE — Assessment & Plan Note (Addendum)
R transudative pleural effusion RESolved s/p tap Plan Keep pt euvolemic No further pulm f/u or w/u needed Prn return

## 2012-12-20 NOTE — Progress Notes (Signed)
Quick Note:  Called, spoke with pt. Informed her of cxr results and recs per Dr. Wright. She verbalized understanding and voiced no further questions or concerns at this time. ______ 

## 2012-12-22 LAB — PROTIME-INR: INR: 2.9 — AB (ref 0.9–1.1)

## 2012-12-27 ENCOUNTER — Ambulatory Visit (INDEPENDENT_AMBULATORY_CARE_PROVIDER_SITE_OTHER): Payer: Medicare Other | Admitting: Pharmacist

## 2012-12-27 DIAGNOSIS — I4891 Unspecified atrial fibrillation: Secondary | ICD-10-CM

## 2013-01-14 ENCOUNTER — Other Ambulatory Visit: Payer: Self-pay | Admitting: Cardiology

## 2013-01-17 LAB — PROTIME-INR: INR: 2.7 — AB (ref 0.9–1.1)

## 2013-01-18 ENCOUNTER — Ambulatory Visit (INDEPENDENT_AMBULATORY_CARE_PROVIDER_SITE_OTHER): Payer: Medicare Other | Admitting: Cardiovascular Disease

## 2013-01-18 DIAGNOSIS — I4891 Unspecified atrial fibrillation: Secondary | ICD-10-CM

## 2013-02-14 ENCOUNTER — Other Ambulatory Visit: Payer: Self-pay | Admitting: Cardiology

## 2013-02-18 LAB — PROTIME-INR: INR: 1.6 — AB (ref 0.9–1.1)

## 2013-02-21 ENCOUNTER — Ambulatory Visit (INDEPENDENT_AMBULATORY_CARE_PROVIDER_SITE_OTHER): Payer: Medicare Other | Admitting: Pharmacist

## 2013-02-21 DIAGNOSIS — I4891 Unspecified atrial fibrillation: Secondary | ICD-10-CM

## 2013-02-27 LAB — PROTIME-INR: INR: 2.6 — AB (ref 0.9–1.1)

## 2013-03-01 ENCOUNTER — Ambulatory Visit (INDEPENDENT_AMBULATORY_CARE_PROVIDER_SITE_OTHER): Payer: Medicare Other | Admitting: Pharmacist

## 2013-03-01 DIAGNOSIS — I4891 Unspecified atrial fibrillation: Secondary | ICD-10-CM

## 2013-03-08 ENCOUNTER — Other Ambulatory Visit: Payer: Self-pay

## 2013-03-08 ENCOUNTER — Telehealth: Payer: Self-pay | Admitting: Cardiology

## 2013-03-08 MED ORDER — LOSARTAN POTASSIUM 100 MG PO TABS: 100.0000 mg | ORAL_TABLET | Freq: Every day | ORAL | Status: DC

## 2013-03-08 NOTE — Telephone Encounter (Signed)
Returned call to patient she stated her B/P has been elevated since last Wednesday 03/02/13.Stated she had a injection in right knee 03/02/13 for pain in rt knee by Dr.Geoffrey.Stated B/P was elevated before she had injection.Stated B/P ranging 160/88,168/86,172/90,today 203/115 pulse 75..Stated she feels ok no complaints.Message sent to Dr.Jordan for advice.

## 2013-03-08 NOTE — Telephone Encounter (Signed)
Returned call to patient Dr.Jordan advised to increase Losartan to 100 mg daily.Appointment scheduled with Norma FredricksonLori Gerhardt NP 03/23/13 at 10:30 am.

## 2013-03-08 NOTE — Telephone Encounter (Signed)
BP was normal when seen in November. I recommend increasing losartan to 100 mg daily. Then needs follow up visit with myself or extender in 2 weeks.  Peter SwazilandJordan MD, [facility]

## 2013-03-08 NOTE — Telephone Encounter (Signed)
New message    bp today is 203/115.  No other symptoms.  Do you think Dr SwazilandJordan want to adjust her medication or see her?

## 2013-03-08 NOTE — Addendum Note (Signed)
Addended by: Meda KlinefelterPUGH, Nyima Vanacker JOHNSON D on: 03/08/2013 12:15 PM   Modules accepted: Orders, Medications

## 2013-03-23 ENCOUNTER — Ambulatory Visit (INDEPENDENT_AMBULATORY_CARE_PROVIDER_SITE_OTHER): Payer: Medicare Other | Admitting: Nurse Practitioner

## 2013-03-23 ENCOUNTER — Encounter: Payer: Self-pay | Admitting: Nurse Practitioner

## 2013-03-23 ENCOUNTER — Ambulatory Visit (INDEPENDENT_AMBULATORY_CARE_PROVIDER_SITE_OTHER): Payer: Medicare Other | Admitting: Pharmacist

## 2013-03-23 VITALS — BP 130/80 | HR 98 | Ht 62.5 in | Wt 107.4 lb

## 2013-03-23 DIAGNOSIS — I4891 Unspecified atrial fibrillation: Secondary | ICD-10-CM

## 2013-03-23 LAB — CBC
HCT: 37.2 % (ref 36.0–46.0)
Hemoglobin: 12.2 g/dL (ref 12.0–15.0)
MCHC: 32.9 g/dL (ref 30.0–36.0)
MCV: 92.8 fl (ref 78.0–100.0)
Platelets: 194 10*3/uL (ref 150.0–400.0)
RBC: 4 Mil/uL (ref 3.87–5.11)
RDW: 14.8 % — ABNORMAL HIGH (ref 11.5–14.6)
WBC: 6.7 10*3/uL (ref 4.5–10.5)

## 2013-03-23 LAB — BASIC METABOLIC PANEL
BUN: 16 mg/dL (ref 6–23)
CO2: 25 mEq/L (ref 19–32)
Calcium: 8.4 mg/dL (ref 8.4–10.5)
Chloride: 105 mEq/L (ref 96–112)
Creatinine, Ser: 0.7 mg/dL (ref 0.4–1.2)
GFR: 91.42 mL/min (ref >60.00)
Glucose, Bld: 101 mg/dL — ABNORMAL HIGH (ref 70–99)
Potassium: 3.5 mEq/L (ref 3.5–5.1)
Sodium: 136 mEq/L (ref 135–145)

## 2013-03-23 LAB — TSH: TSH: 1.52 u[IU]/mL (ref 0.35–5.50)

## 2013-03-23 LAB — PROTIME-INR: INR: 2.3 — AB (ref 0.9–1.1)

## 2013-03-23 NOTE — Patient Instructions (Addendum)
Stay on your current medicines for now  Continue to monitor your blood pressure - need to have a small cuff used due to the small size of your arm - try to NOT use an automatic cuff but actually listen with a stethoscope - goal is to try and stay below 160/90 - if consistently higher than this - then call us  See Dr. Johney FrameAllred and Dr. SwazilandJordan as planned  We will check labs today  Call the [facility] office at [phone] if you have any questions, problems or concerns.

## 2013-03-23 NOTE — Progress Notes (Signed)
Laurie Larsen Date of Birth: 1923/04/23 Medical Record #[id number]  History of Present Illness: Laurie Larsen is seen back today for a work in visit. Seen for Dr. Swaziland. Former patient of Dr. Silva Bandy. She is an 80 year old female with multiple issues which include HLD, chronic atrial fib, chronic coumadin therapy, HTN, chronic diastolic dysfunction, CAD with known occlusion of a distal LCX branch from 2005, SSS with PPM in place (followed by Dr. Johney Frame), chronic pain, HLD, and hypothyroidism. Did have pneumonia back in July of 2014 as well as right pleural effusion requiring thoracentesis - evaluation suggested a transudative process with some reactive mesothelial cells.   Last seen back in November of 2014. Felt to be doing ok.   Comes back today. Here alone. Called earlier this month with an elevated BP - Losartan was increased to 100 mg. She still notes some lability in her readings. Notes that her breathing is still short at times but not any worse - not on any oxygen. No chest pain. Tries to be as active as possible. Admits that she hates the food where she lives and so she eats out very often - goes to [employer], [employer], etc. Does use her Lasix a couple of times a week but notes that she could probably take this every day but doesn't due to going out or just forgetting it altogether. Clearly getting too much salt.  Current Outpatient Prescriptions  Medication Sig Dispense Refill  . Acetaminophen (TYLENOL EX ST ARTHRITIS PAIN PO) Take by mouth as needed. For Pain.       [name] atenolol (TENORMIN) 50 MG tablet Take 0.5 tablets (25 mg total) by mouth 2 (two) times daily.  90 tablet  6  . atorvastatin (LIPITOR) 40 MG tablet Take 0.5 tablets (20 mg total) by mouth at bedtime.  90 tablet  3  . diltiazem (CARDIZEM CD) 180 MG 24 hr capsule TAKE 1 CAPSULE BY MOUTH DAILY  90 capsule  1  . dorzolamide-timolol (COSOPT) 22.3-6.8 MG/ML ophthalmic solution 1 drop as directed.        . escitalopram (LEXAPRO)  10 MG tablet Take 10 mg by mouth every other day.       . furosemide (LASIX) 80 MG tablet Take 80 mg by mouth as needed.      [name] HYDROcodone-acetaminophen (NORCO/VICODIN) 5-325 MG per tablet Take 1 tablet by mouth as needed.      [name] losartan (COZAAR) 100 MG tablet Take 1 tablet (100 mg total) by mouth daily.  30 tablet  6  . Multiple Vitamins-Minerals (ICAPS AREDS FORMULA PO) Take by mouth 2 (two) times daily.       . potassium chloride (KLOR-CON 10) 10 MEQ tablet Take 1 tablet (10 mEq total) by mouth 2 (two) times daily.  60 tablet  5  . SYNTHROID 125 MCG tablet TAKE 1 TABLET BY MOUTH DAILY  90 tablet  1  . traMADol (ULTRAM) 50 MG tablet Take 1 tablet (50 mg total) by mouth as needed. For pain.  30 tablet  5  . travoprost, benzalkonium, (TRAVATAN) 0.004 % ophthalmic solution 1 drop as directed.        . warfarin (COUMADIN) 5 MG tablet Take as directed by coumadin clinic  40 tablet  3   No current facility-administered medications for this visit.    Allergies  Allergen Reactions  . Amoxicillin     Weakness  . Digoxin     REACTION: weakness, dehydration  . Risedronate Sodium  REACTION: rash    Past Medical History  Diagnosis Date  . Permanent atrial fibrillation   . HTN (hypertension)   . Diastolic dysfunction   . CAD (coronary artery disease)     W/occlusion of distal circumflex branch 2005  . Sick sinus syndrome     W/DDD pacemaker  . Chronic pain     ? due to arthritis  . Glaucoma   . Chronic anticoagulation   . Hyperlipidemia   . Diverticulitis large intestine      by history  . Allergy   . Phlebitis     after pacemeker placement  . Colon polyps   . Thyroid disease     hypothyroidism on medications  . Transfusion history     after childbirth 60 years ago (1957)  . CHF (congestive heart failure)     hospitalized 2010  . Rash/skin eruption     multiple episodes - wide distribution-intermittent, possibly drug rash.    Past Surgical History  Procedure Laterality  Date  . Appendectomy    . Abdominal hysterectomy    . Tonsillectomy    . Kidney surgery      Right  . Ankle fracture surgery      Left, pin  . Pacemaker insertion  06/20/08    MDT EnRhythm DR implanted by Dr Reyes Ivan  . Breast surgery      Needle guided excision, right breast calcification  . Breast fibroadenoma surgery  2010    Right  . Skin graft      left leg    History  Smoking status  . Never Smoker   Smokeless tobacco  . Never Used    History  Alcohol Use No    Family History  Problem Relation Age of Onset  . Cancer Mother     colon with mets  . Diabetes Mother   . Heart disease Father   . Hyperlipidemia Father   . Hypertension Father   . Stroke Brother     brother who died 06-20-2022  . Cancer Paternal Aunt     breast  . Cancer Cousin     uterine, lung cancer    Review of Systems: The review of systems is per the HPI.  All other systems were reviewed and are negative.  Physical Exam: BP 130/80  Pulse 98  Ht 5' 2.5" (1.588 m)  Wt 107 lb 6.4 oz (48.716 kg)  BMI 19.32 kg/m2  SpO2 81% BP 122/60 by me. Oxygen sat 96% for me.  Patient is a very pleasant elderly, frail female who is alert and in no acute distress. Skin is warm and dry. Color is normal.  HEENT is unremarkable. Normocephalic/atraumatic. PERRL. Sclera are nonicteric. Neck is supple. No masses. No JVD. Lungs are clear. Cardiac exam shows an irregular rhythm. Rate is ok. Abdomen is soft. Extremities are full with trace edema. Gait and ROM are intact. No gross neurologic deficits noted.  Wt Readings from Last 3 Encounters:  03/23/13 107 lb 6.4 oz (48.716 kg)  12/17/12 102 lb (46.267 kg)  12/08/12 102 lb (46.267 kg)    LABORATORY DATA: PENDING  Lab Results  Component Value Date   WBC 6.2 02/26/2011   HGB 11.9* 02/26/2011   HCT 34.7* 02/26/2011   PLT 190.0 02/26/2011   GLUCOSE 97 12/22/2011   CHOL 118 03/26/2011   TRIG 105.0 03/26/2011   HDL 51.10 03/26/2011   LDLCALC 46 03/26/2011   ALT 27  03/26/2011   AST 23 03/26/2011   NA  134* 12/22/2011   K 4.1 12/22/2011   CL 103 12/22/2011   CREATININE 0.8 12/22/2011   BUN 19 12/22/2011   CO2 26 12/22/2011   TSH 0.87 07/09/2010   INR 2.6* 02/27/2013     Assessment / Plan: 1. Chronic atrial fib - with PPM in place - on coumadin - no bleeding complications reported. Rate is controlled.   2. HTN - some labile readings - most likely exacerbated by excessive salt - this is not likely to change - Recheck of her BP by me is 122/60. Her arm is quite small. Need a smaller cuff and preferably not an automatic BP cuff to accurately check. I do not think we need to change her medicines at this time - and try to have a goal of =/< 160/90 most of the time. She is already spacing out her medicines. Recheck baseline labs today.   3. CAD - no chest pain reported  4. Chronic dyspnea - oxygen sat recheck by me is 96%.   See her back as planned.   Patient is agreeable to this plan and will call if any problems develop in the interim.   Rosalio MacadamiaLori C. Eldoris Beiser, RN, ANP[facility] [address] [place], [place]  [id number] [phone]

## 2013-04-04 ENCOUNTER — Other Ambulatory Visit: Payer: Self-pay | Admitting: Cardiology

## 2013-04-05 ENCOUNTER — Other Ambulatory Visit: Payer: Self-pay

## 2013-04-05 MED ORDER — LOSARTAN POTASSIUM 100 MG PO TABS: 100.0000 mg | ORAL_TABLET | Freq: Every day | ORAL | Status: DC

## 2013-04-19 LAB — PROTIME-INR: INR: 2.6

## 2013-04-20 ENCOUNTER — Ambulatory Visit (HOSPITAL_COMMUNITY)
Admission: RE | Admit: 2013-04-20 | Discharge: 2013-04-20 | Disposition: A | Discharge status: Home / Self Care | Payer: Medicare Other | Source: Ambulatory Visit | Attending: Internal Medicine | Admitting: Internal Medicine

## 2013-04-20 ENCOUNTER — Ambulatory Visit (INDEPENDENT_AMBULATORY_CARE_PROVIDER_SITE_OTHER): Payer: Medicare Other | Admitting: Internal Medicine

## 2013-04-20 ENCOUNTER — Encounter: Payer: Self-pay | Admitting: Internal Medicine

## 2013-04-20 VITALS — BP 150/82 | HR 95 | Ht 62.5 in | Wt 107.4 lb

## 2013-04-20 DIAGNOSIS — I4891 Unspecified atrial fibrillation: Secondary | ICD-10-CM

## 2013-04-20 DIAGNOSIS — R0602 Shortness of breath: Secondary | ICD-10-CM

## 2013-04-20 DIAGNOSIS — I517 Cardiomegaly: Secondary | ICD-10-CM | POA: Insufficient documentation

## 2013-04-20 DIAGNOSIS — J90 Pleural effusion, not elsewhere classified: Secondary | ICD-10-CM | POA: Insufficient documentation

## 2013-04-20 DIAGNOSIS — G4734 Idiopathic sleep related nonobstructive alveolar hypoventilation: Secondary | ICD-10-CM | POA: Insufficient documentation

## 2013-04-20 DIAGNOSIS — I495 Sick sinus syndrome: Secondary | ICD-10-CM

## 2013-04-20 LAB — MDC_IDC_ENUM_SESS_TYPE_INCLINIC
Brady Statistic RV Percent Paced: 51.88 %
Date Time Interrogation Session: 20150325115256
Lead Channel Impedance Value: 544 Ohm
Lead Channel Pacing Threshold Amplitude: 1 V
Lead Channel Sensing Intrinsic Amplitude: 0.9388
Lead Channel Sensing Intrinsic Amplitude: 7.9341
Lead Channel Setting Pacing Pulse Width: 0.4 ms
Lead Channel Setting Sensing Sensitivity: 0.9 mV
MDC IDC MSMT BATTERY VOLTAGE: 2.92 V
MDC IDC MSMT LEADCHNL RV IMPEDANCE VALUE: 448 Ohm
MDC IDC MSMT LEADCHNL RV PACING THRESHOLD PULSEWIDTH: 0.4 ms
MDC IDC SET LEADCHNL RV PACING AMPLITUDE: 2.5 V
Zone Setting Detection Interval: 350 ms
Zone Setting Detection Interval: 400 ms

## 2013-04-20 LAB — CBC WITH DIFFERENTIAL/PLATELET
BASOS ABS: 0.1 10*3/uL (ref 0.0–0.1)
Basophils Relative: 0.8 % (ref 0.0–3.0)
EOS PCT: 1.4 % (ref 0.0–5.0)
Eosinophils Absolute: 0.1 10*3/uL (ref 0.0–0.7)
HEMATOCRIT: 38.8 % (ref 36.0–46.0)
Hemoglobin: 12.9 g/dL (ref 12.0–15.0)
LYMPHS ABS: 0.8 10*3/uL (ref 0.7–4.0)
Lymphocytes Relative: 11.5 % — ABNORMAL LOW (ref 12.0–46.0)
MCHC: 33.2 g/dL (ref 30.0–36.0)
MCV: 92.7 fl (ref 78.0–100.0)
Monocytes Absolute: 0.6 10*3/uL (ref 0.1–1.0)
Monocytes Relative: 8.9 % (ref 3.0–12.0)
Neutro Abs: 5.4 10*3/uL (ref 1.4–7.7)
Neutrophils Relative %: 77.4 % — ABNORMAL HIGH (ref 43.0–77.0)
PLATELETS: 212 10*3/uL (ref 150.0–400.0)
RBC: 4.18 Mil/uL (ref 3.87–5.11)
RDW: 14.8 % — AB (ref 11.5–14.6)
WBC: 7 10*3/uL (ref 4.5–10.5)

## 2013-04-20 LAB — BASIC METABOLIC PANEL
BUN: 16 mg/dL (ref 6–23)
CALCIUM: 8.7 mg/dL (ref 8.4–10.5)
CO2: 27 meq/L (ref 19–32)
Chloride: 104 mEq/L (ref 96–112)
Creatinine, Ser: 0.7 mg/dL (ref 0.4–1.2)
GFR: 86.77 mL/min (ref >60.00)
Glucose, Bld: 108 mg/dL — ABNORMAL HIGH (ref 70–99)
POTASSIUM: 3.5 meq/L (ref 3.5–5.1)
SODIUM: 138 meq/L (ref 135–145)

## 2013-04-20 MED ORDER — FUROSEMIDE 80 MG PO TABS: 80.0000 mg | ORAL_TABLET | Freq: Every day | ORAL | Status: DC

## 2013-04-20 NOTE — Patient Instructions (Signed)
Your physician recommends that you schedule a follow-up appointment in: 2 weeks with Norma FredricksonLori Gerhardt, NP. , 6 months in the device clinic and 12 months with Dr. Johney FrameAllred.   [facility]. Go in through [address] and then go to radiology.  Your physician has recommended you make the following change in your medication:  Take furosemide 80 mg by mouth daily    Follow a 2 gram Sodium Diet       2 Gram Low Sodium Diet A 2 gram sodium diet restricts the amount of sodium in the diet to no more than 2 g or 2000 mg daily. Limiting the amount of sodium is often used to help lower blood pressure. It is important if you have heart, liver, or kidney problems. Many foods contain sodium for flavor and sometimes as a preservative. When the amount of sodium in a diet needs to be low, it is important to know what to look for when choosing foods and drinks. The following includes some information and guidelines to help make it easier for you to adapt to a low sodium diet. QUICK TIPS  Do not add salt to food.  Avoid convenience items and fast food.  Choose unsalted snack foods.  Buy lower sodium products, often labeled as "lower sodium" or "no salt added."  Check food labels to learn how much sodium is in 1 serving.  When eating at a restaurant, ask that your food be prepared with less salt or none, if possible. READING FOOD LABELS FOR SODIUM INFORMATION The nutrition facts label is a good place to find how much sodium is in foods. Look for products with no more than 500 to 600 mg of sodium per meal and no more than 150 mg per serving. Remember that 2 g = 2000 mg. The food label may also list foods as:  Sodium-free: Less than 5 mg in a serving.  Very low sodium: 35 mg or less in a serving.  Low-sodium: 140 mg or less in a serving.  Light in sodium: 50% less sodium in a serving. For example, if a food that usually has 300 mg of sodium is changed to  become light in sodium, it will have 150 mg of sodium.  Reduced sodium: 25% less sodium in a serving. For example, if a food that usually has 400 mg of sodium is changed to reduced sodium, it will have 300 mg of sodium. CHOOSING FOODS Grains  Avoid: Salted crackers and snack items. Some cereals, including instant hot cereals. Bread stuffing and biscuit mixes. Seasoned rice or pasta mixes.  Choose: Unsalted snack items. Low-sodium cereals, oats, puffed wheat and rice, shredded wheat. English muffins and bread. Pasta. Meats  Avoid: Salted, canned, smoked, spiced, pickled meats, including fish and poultry. Bacon, ham, sausage, cold cuts, hot dogs, anchovies.  Choose: Low-sodium canned tuna and salmon. Fresh or frozen meat, poultry, and fish. Dairy  Avoid: Processed cheese and spreads. Cottage cheese. Buttermilk and condensed milk. Regular cheese.  Choose: Milk. Low-sodium cottage cheese. Yogurt. Sour cream. Low-sodium cheese. Fruits and Vegetables  Avoid: Regular canned vegetables. Regular canned tomato sauce and paste. Frozen vegetables in sauces. Olives. Rosita FirePickles. Relishes. Sauerkraut.  Choose: Low-sodium canned vegetables. Low-sodium tomato sauce and paste. Frozen or fresh vegetables. Fresh and frozen fruit. Condiments  Avoid: Canned and packaged gravies. Worcestershire sauce. Tartar sauce. Barbecue sauce. Soy sauce. Steak sauce. Ketchup. Onion, garlic, and table salt. Meat flavorings and tenderizers.  Choose: Fresh and  dried herbs and spices. Low-sodium varieties of mustard and ketchup. Lemon juice. Tabasco sauce. Horseradish. SAMPLE 2 GRAM SODIUM MEAL PLAN Breakfast / Sodium (mg)  1 cup low-fat milk / 143 mg  2 slices whole-wheat toast / 270 mg  1 tbs heart-healthy margarine / 153 mg  1 hard-boiled egg / 139 mg  1 small orange / 0 mg Lunch / Sodium (mg)  1 cup raw carrots / 76 mg   cup hummus / 298 mg  1 cup low-fat milk / 143 mg   cup red grapes / 2 mg  1  whole-wheat pita bread / 356 mg Dinner / Sodium (mg)  1 cup whole-wheat pasta / 2 mg  1 cup low-sodium tomato sauce / 73 mg  3 oz lean ground beef / 57 mg  1 small side salad (1 cup raw spinach leaves,  cup cucumber,  cup yellow bell pepper) with 1 tsp olive oil and 1 tsp red wine vinegar / 25 mg Snack / Sodium (mg)  1 container low-fat vanilla yogurt / 107 mg  3 graham cracker squares / 127 mg Nutrient Analysis  Calories: 2033  Protein: 77 g  Carbohydrate: 282 g  Fat: 72 g  Sodium: 1971 mg Document Released: 01/13/2005 Document Revised: 04/07/2011 Document Reviewed: 04/16/2009 [employer] Patient Information 2014 [place], [place].

## 2013-04-20 NOTE — Progress Notes (Signed)
PCP:  Mickie Hillier, MD Primary Cardiologist:  Dr Swaziland  The patient presents today for routine electrophysiology followup. She reports fatigue and SOB with activity over the past 3-4 weeks.  She also notes increased edema.  She has been taking her lasix infrequently.  Today, she denies symptoms of palpitations, chest pain,  dizziness, presyncope, syncope, GI bleeding, or neurologic sequela. The patient feels that she is tolerating medications without difficulties and is otherwise without complaint today.   Past Medical History  Diagnosis Date  . Permanent atrial fibrillation   . HTN (hypertension)   . Diastolic dysfunction   . CAD (coronary artery disease)     W/occlusion of distal circumflex branch 2005  . Sick sinus syndrome     W/DDD pacemaker  . Chronic pain     ? due to arthritis  . Glaucoma   . Chronic anticoagulation   . Hyperlipidemia   . Diverticulitis large intestine      by history  . Allergy   . Phlebitis     after pacemeker placement  . Colon polyps   . Thyroid disease     hypothyroidism on medications  . Transfusion history     after childbirth 60 years ago (1957)  . CHF (congestive heart failure)     hospitalized 2010  . Rash/skin eruption     multiple episodes - wide distribution-intermittent, possibly drug rash.   Past Surgical History  Procedure Laterality Date  . Appendectomy    . Abdominal hysterectomy    . Tonsillectomy    . Kidney surgery      Right  . Ankle fracture surgery      Left, pin  . Pacemaker insertion  06/20/08    MDT EnRhythm DR implanted by Dr Reyes Ivan  . Breast surgery      Needle guided excision, right breast calcification  . Breast fibroadenoma surgery  2010    Right  . Skin graft      left leg    Current Outpatient Prescriptions  Medication Sig Dispense Refill  . Acetaminophen (TYLENOL EX ST ARTHRITIS PAIN PO) Take 1 tablet by mouth as needed. For Pain.      [name] atenolol (TENORMIN) 50 MG tablet Take 0.5 tablets (25 mg  total) by mouth 2 (two) times daily.  90 tablet  6  . atorvastatin (LIPITOR) 40 MG tablet Take 0.5 tablets (20 mg total) by mouth at bedtime.  90 tablet  3  . Calcium-Vitamin D 600-200 MG-UNIT per tablet Take 1 tablet by mouth daily.      [name] diltiazem (CARDIZEM CD) 180 MG 24 hr capsule TAKE 1 CAPSULE BY MOUTH DAILY EACH AM      . dorzolamide-timolol (COSOPT) 22.3-6.8 MG/ML ophthalmic solution 1 drop as directed.        . escitalopram (LEXAPRO) 10 MG tablet Take 10 mg by mouth every other day.       . furosemide (LASIX) 80 MG tablet Take 1 tablet (80 mg total) by mouth daily.  30 tablet  6  . HYDROcodone-acetaminophen (NORCO/VICODIN) 5-325 MG per tablet Take 1 tablet by mouth as needed.      [name] losartan (COZAAR) 100 MG tablet Take 1 tablet (100 mg total) by mouth at bedtime.  90 tablet  1  . Multiple Vitamins-Minerals (ICAPS AREDS FORMULA PO) Take 1 capsule by mouth 2 (two) times daily.       . potassium chloride (K-DUR,KLOR-CON) 10 MEQ tablet TAKE ONE TABLET BY MOUTH TWICE DAILY  60 tablet  0  . SYNTHROID 125 MCG tablet TAKE 1 TABLET BY MOUTH DAILY  90 tablet  1  . traMADol (ULTRAM) 50 MG tablet Take 1 tablet (50 mg total) by mouth as needed. For pain.  30 tablet  5  . travoprost, benzalkonium, (TRAVATAN) 0.004 % ophthalmic solution 1 drop as directed.        . warfarin (COUMADIN) 5 MG tablet Take as directed by coumadin clinic  40 tablet  3  . [DISCONTINUED] potassium chloride (KLOR-CON 10) 10 MEQ tablet Take 1 tablet (10 mEq total) by mouth 2 (two) times daily.  60 tablet  5   No current facility-administered medications for this visit.    Allergies  Allergen Reactions  . Amoxicillin     Weakness  . Digoxin     REACTION: weakness, dehydration  . Risedronate Sodium     REACTION: rash    History   Social History  . Marital Status: Married    Spouse Name: N/A    Number of Children: 3  . Years of Education: 13   Occupational History  . RETIRED    Social History Main Topics  .  Smoking status: Never Smoker   . Smokeless tobacco: Never Used  . Alcohol Use: No  . Drug Use: No  . Sexual Activity: No   Other Topics Concern  . 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  Married 1948. 3 sons - 2050/06/06, 06/06/2055, 2056-06-05; 5 grandchildren-one deceased -  OD @ 73.  Lives alone with husband and they are independent in ADLs.  End of Life Care: no prolonged heroic measures, i.e. Artificial feeding or hydration; DNR; no prolonged intubation.    Family History  Problem Relation Age of Onset  . Cancer Mother     colon with mets  . Diabetes Mother   . Heart disease Father   . Hyperlipidemia Father   . Hypertension Father   . Stroke Brother     brother who died 2022/06/08  . Cancer Paternal Aunt     breast  . Cancer Cousin     uterine, lung cancer    Physical Exam: Filed Vitals:   04/20/13 1103  BP: 150/82  Pulse: 95  Height: 5' 2.5" (1.588 m)  Weight: 107 lb 6.4 oz (48.716 kg)    GEN- The patient is well appearing, alert and oriented x 3 today.   Head- normocephalic, atraumatic Eyes-  Sclera clear, conjunctiva pink Ears- hearing intact Oropharynx- clear Neck- supple,  Lungs- decreased BS at R base Chest- pacemaker pocket is well healed Heart- irregular rate and rhythm   GI- soft, NT, ND, + BS Extremities- no clubbing, cyanosis, 1+ edema (actually appears stable on exam)  Pacemaker interrogation- reviewed in detail today,  See PACEART report  Assessment and Plan:  1. Bradycardia Normal pacemaker function See Pace Art report No changes today  2. afib Permanent Continue coumadin long term  3. SOB Unclear etiology She is mildly volume overloaded on exam today.  2 gram sodium restriction is advised.  Her R sided BS are decreased.  I will repeat CXR to make sure that she has not had increase in pleural effusion size.  I will also obtain bmet and cbc today.  TFTs  2/15 were ok. She may benefit from a repeat echo if above is unrevealing.  Given her advanced age, she is not a  candidate for further invasive procedures.  As her afib has been permanent for > 1 year, I do not feel that this is the cause for her acute decline.  Histogram reveals that V rates are controlled.  EKG today is without ischemic changes  Return to see Lawson FiscalLori in 2 weeks Follow-up with Dr SwazilandJordan as scheduled  Return to the device clinic in 6 months I will see again in 1 year

## 2013-05-04 ENCOUNTER — Ambulatory Visit (INDEPENDENT_AMBULATORY_CARE_PROVIDER_SITE_OTHER): Payer: Medicare Other | Admitting: Nurse Practitioner

## 2013-05-04 ENCOUNTER — Encounter: Payer: Self-pay | Admitting: Nurse Practitioner

## 2013-05-04 VITALS — BP 160/80 | HR 96 | Ht 62.0 in | Wt 109.0 lb

## 2013-05-04 DIAGNOSIS — R0989 Other specified symptoms and signs involving the circulatory and respiratory systems: Secondary | ICD-10-CM

## 2013-05-04 DIAGNOSIS — R0609 Other forms of dyspnea: Secondary | ICD-10-CM

## 2013-05-04 DIAGNOSIS — I5031 Acute diastolic (congestive) heart failure: Secondary | ICD-10-CM

## 2013-05-04 DIAGNOSIS — R0600 Dyspnea, unspecified: Secondary | ICD-10-CM

## 2013-05-04 LAB — BRAIN NATRIURETIC PEPTIDE: Pro B Natriuretic peptide (BNP): 726 pg/mL — ABNORMAL HIGH (ref 0.0–100.0)

## 2013-05-04 NOTE — Patient Instructions (Addendum)
We need to check lab today  We will get an ultrasound of your heart  You need to take the fluid pill every morning - do this for at least the next week   See Dr. SwazilandJordan back as planned next month  Really try to restrict your salt  Call the [facility] office at [phone] if you have any questions, problems or concerns.

## 2013-05-04 NOTE — Progress Notes (Signed)
Laurie Larsen Date of Birth: 1923/09/04 Medical Record #[id number]  History of Present Illness: Laurie Larsen is seen back today for a 2 week check.  Seen for Dr. Johney Frame and Dr. Swaziland. Former patient of Dr. Silva Bandy. She is an 80 year old female with multiple issues which include HLD, chronic atrial fib, chronic coumadin therapy, HTN, chronic diastolic dysfunction, CAD with known occlusion of a distal LCX branch from 2005, SSS with PPM in place (followed by Dr. Johney Frame), chronic pain, HLD, and hypothyroidism. Did have pneumonia back in July of 2014 as well as right pleural effusion requiring thoracentesis - evaluation suggested a transudative process with some reactive mesothelial cells.   I saw her this past February with elevated BP - clearly getting too much salt due to eating out and was not taking her Lasix as directed.  Saw Dr. Johney Frame 2 weeks ago for her pacemaker check - endorsed dyspnea of unclear etiology. He advised low salt diet and sent her for a CXR. To consider echo if she did not improve. Her PPM check was ok. He did not feel this was related to her AF.  Comes in today. Here in the room alone. No chest pain. Does have some tachy palpitations at times. Still short of breath. Still not taking her Lasix every day - probably only about 3 x a week. Still eating out. Had "city" ham for Easter. More swelling. Weight is up a few pounds. Did have a fall about a month ago and hurt her knee. No recent fall.   Current Outpatient Prescriptions  Medication Sig Dispense Refill  . Acetaminophen (TYLENOL EX ST ARTHRITIS PAIN PO) Take 1 tablet by mouth as needed. For Pain.      [name] atenolol (TENORMIN) 50 MG tablet Take 0.5 tablets (25 mg total) by mouth 2 (two) times daily.  90 tablet  6  . atorvastatin (LIPITOR) 40 MG tablet Take 0.5 tablets (20 mg total) by mouth at bedtime.  90 tablet  3  . Calcium-Vitamin D 600-200 MG-UNIT per tablet Take 1 tablet by mouth daily.      [name] diltiazem (CARDIZEM CD) 180  MG 24 hr capsule TAKE 1 CAPSULE BY MOUTH DAILY EACH AM      . dorzolamide-timolol (COSOPT) 22.3-6.8 MG/ML ophthalmic solution 1 drop as directed.        . escitalopram (LEXAPRO) 10 MG tablet Take 10 mg by mouth every other day.       . furosemide (LASIX) 80 MG tablet Take 1 tablet (80 mg total) by mouth daily.  30 tablet  6  . HYDROcodone-acetaminophen (NORCO/VICODIN) 5-325 MG per tablet Take 1 tablet by mouth as needed.      [name] losartan (COZAAR) 100 MG tablet Take 1 tablet (100 mg total) by mouth at bedtime.  90 tablet  1  . Multiple Vitamins-Minerals (ICAPS AREDS FORMULA PO) Take 1 capsule by mouth 2 (two) times daily.       . potassium chloride (K-DUR,KLOR-CON) 10 MEQ tablet TAKE ONE TABLET BY MOUTH TWICE DAILY  60 tablet  0  . SYNTHROID 125 MCG tablet TAKE 1 TABLET BY MOUTH DAILY  90 tablet  1  . traMADol (ULTRAM) 50 MG tablet Take 1 tablet (50 mg total) by mouth as needed. For pain.  30 tablet  5  . travoprost, benzalkonium, (TRAVATAN) 0.004 % ophthalmic solution 1 drop as directed.        . warfarin (COUMADIN) 5 MG tablet Take as directed by coumadin clinic  40  tablet  3  . [DISCONTINUED] potassium chloride (KLOR-CON 10) 10 MEQ tablet Take 1 tablet (10 mEq total) by mouth 2 (two) times daily.  60 tablet  5   No current facility-administered medications for this visit.    Allergies  Allergen Reactions  . Amoxicillin     Weakness  . Digoxin     REACTION: weakness, dehydration  . Risedronate Sodium     REACTION: rash    Past Medical History  Diagnosis Date  . Permanent atrial fibrillation   . HTN (hypertension)   . Diastolic dysfunction   . CAD (coronary artery disease)     W/occlusion of distal circumflex branch 2005  . Sick sinus syndrome     W/DDD pacemaker  . Chronic pain     ? due to arthritis  . Glaucoma   . Chronic anticoagulation   . Hyperlipidemia   . Diverticulitis large intestine      by history  . Allergy   . Phlebitis     after pacemeker placement  . Colon  polyps   . Thyroid disease     hypothyroidism on medications  . Transfusion history     after childbirth 60 years ago (1957)  . CHF (congestive heart failure)     hospitalized 2010  . Rash/skin eruption     multiple episodes - wide distribution-intermittent, possibly drug rash.    Past Surgical History  Procedure Laterality Date  . Appendectomy    . Abdominal hysterectomy    . Tonsillectomy    . Kidney surgery      Right  . Ankle fracture surgery      Left, pin  . Pacemaker insertion  06/20/08    MDT EnRhythm DR implanted by Dr Reyes Ivan  . Breast surgery      Needle guided excision, right breast calcification  . Breast fibroadenoma surgery  2010    Right  . Skin graft      left leg    History  Smoking status  . Never Smoker   Smokeless tobacco  . Never Used    History  Alcohol Use No    Family History  Problem Relation Age of Onset  . Cancer Mother     colon with mets  . Diabetes Mother   . Heart disease Father   . Hyperlipidemia Father   . Hypertension Father   . Stroke Brother     brother who died 2022/06/15  . Cancer Paternal Aunt     breast  . Cancer Cousin     uterine, lung cancer    Review of Systems: The review of systems is per the HPI.  All other systems were reviewed and are negative.  Physical Exam: BP 160/80  Pulse 96  Ht 5\' 2"  (1.575 m)  Wt 109 lb (49.442 kg)  BMI 19.93 kg/m2 Oxygen sat is 95% at rest - with coming into the exam room - she dropped briefly to 91%.  Patient is very pleasant and in no acute distress. She is frail. Little unsteady on her feet. Skin is warm and dry. Color is normal.  HEENT is unremarkable. Normocephalic/atraumatic. PERRL. Sclera are nonicteric. Neck is supple. No masses. No JVD. Lungs are clear. Cardiac exam shows an irregular rate and rhythm. Abdomen is soft. Extremities are with 1+ edema. Lower legs are very shiny and tight. No gross neurologic deficits noted.  Wt Readings from Last 3 Encounters:  05/04/13 109 lb  (49.442 kg)  04/20/13 107 lb 6.4 oz (48.716  kg)  03/23/13 107 lb 6.4 oz (48.716 kg)     LABORATORY DATA: BNP pending   Lab Results  Component Value Date   WBC 7.0 04/20/2013   HGB 12.9 04/20/2013   HCT 38.8 04/20/2013   PLT 212.0 04/20/2013   GLUCOSE 108* 04/20/2013   CHOL 118 03/26/2011   TRIG 105.0 03/26/2011   HDL 51.10 03/26/2011   LDLCALC 46 03/26/2011   ALT 27 03/26/2011   AST 23 03/26/2011   NA 138 04/20/2013   K 3.5 04/20/2013   CL 104 04/20/2013   CREATININE 0.7 04/20/2013   BUN 16 04/20/2013   CO2 27 04/20/2013   TSH 1.52 03/23/2013   INR 2.6 04/19/2013    Lab Results  Component Value Date   INR 2.6 04/19/2013   INR 2.3* 03/23/2013   INR 2.6* 02/27/2013    Dg Chest 2 View  04/20/2013    IMPRESSION: No change in small right-sided pleural effusion with adjacent atelectasis.  Cardiomegaly without congestive failure.   Electronically Signed   By: Jeronimo GreavesKyle  Talbot M.D.   On: 04/20/2013 15:38    Assessment / Plan: 1. Dyspnea - most likely from diastolic HF - needs to take her lasix daily - emphasized again the importance and to restrict salt. Will check an echo. She has follow up planned for next month.   2. HTN - BP fair.   3. Chronic atrial  - with underlying PPM - recent check was stable.   4. Advanced age - seems to be failing in a generalized fashion. If she continues to fall, will need to readdress her coumadin.    Patient is agreeable to this plan and will call if any problems develop in the interim.   Rosalio MacadamiaLori C. Giavonna Pflum, RN, ANP-C [facility] [address] [place], [place]  [id number] [phone]

## 2013-05-16 ENCOUNTER — Ambulatory Visit (HOSPITAL_COMMUNITY): Payer: Medicare Other | Attending: Cardiology | Admitting: Radiology

## 2013-05-16 DIAGNOSIS — I5031 Acute diastolic (congestive) heart failure: Secondary | ICD-10-CM

## 2013-05-16 DIAGNOSIS — R0609 Other forms of dyspnea: Secondary | ICD-10-CM | POA: Insufficient documentation

## 2013-05-16 DIAGNOSIS — I4891 Unspecified atrial fibrillation: Secondary | ICD-10-CM

## 2013-05-16 DIAGNOSIS — R0989 Other specified symptoms and signs involving the circulatory and respiratory systems: Principal | ICD-10-CM | POA: Insufficient documentation

## 2013-05-16 DIAGNOSIS — R0600 Dyspnea, unspecified: Secondary | ICD-10-CM

## 2013-05-16 DIAGNOSIS — I509 Heart failure, unspecified: Secondary | ICD-10-CM | POA: Insufficient documentation

## 2013-05-16 NOTE — Progress Notes (Signed)
Echocardiogram performed.  

## 2013-05-18 LAB — PROTIME-INR: INR: 3.3

## 2013-05-19 ENCOUNTER — Ambulatory Visit (INDEPENDENT_AMBULATORY_CARE_PROVIDER_SITE_OTHER): Payer: Medicare Other | Admitting: Cardiovascular Disease

## 2013-05-19 DIAGNOSIS — Z5181 Encounter for therapeutic drug level monitoring: Secondary | ICD-10-CM | POA: Insufficient documentation

## 2013-05-19 DIAGNOSIS — I4891 Unspecified atrial fibrillation: Secondary | ICD-10-CM

## 2013-05-31 LAB — PROTIME-INR: INR: 1.8 — AB (ref 0.9–1.1)

## 2013-06-01 ENCOUNTER — Ambulatory Visit (INDEPENDENT_AMBULATORY_CARE_PROVIDER_SITE_OTHER): Payer: Medicare Other | Admitting: Internal Medicine

## 2013-06-01 DIAGNOSIS — Z5181 Encounter for therapeutic drug level monitoring: Secondary | ICD-10-CM

## 2013-06-01 DIAGNOSIS — I4891 Unspecified atrial fibrillation: Secondary | ICD-10-CM

## 2013-06-02 ENCOUNTER — Other Ambulatory Visit: Payer: Self-pay

## 2013-06-02 MED ORDER — LOSARTAN POTASSIUM 100 MG PO TABS: 100.0000 mg | ORAL_TABLET | Freq: Every day | ORAL | Status: DC

## 2013-06-03 ENCOUNTER — Ambulatory Visit (INDEPENDENT_AMBULATORY_CARE_PROVIDER_SITE_OTHER): Payer: Medicare Other | Admitting: Critical Care Medicine

## 2013-06-03 ENCOUNTER — Encounter: Payer: Self-pay | Admitting: Critical Care Medicine

## 2013-06-03 VITALS — BP 112/70 | HR 74 | Temp 98.6°F | Ht 64.0 in | Wt 104.2 lb

## 2013-06-03 DIAGNOSIS — R0689 Other abnormalities of breathing: Principal | ICD-10-CM

## 2013-06-03 DIAGNOSIS — R0609 Other forms of dyspnea: Secondary | ICD-10-CM

## 2013-06-03 DIAGNOSIS — J90 Pleural effusion, not elsewhere classified: Secondary | ICD-10-CM

## 2013-06-03 DIAGNOSIS — R0600 Dyspnea, unspecified: Secondary | ICD-10-CM

## 2013-06-03 DIAGNOSIS — R0989 Other specified symptoms and signs involving the circulatory and respiratory systems: Secondary | ICD-10-CM

## 2013-06-03 NOTE — Patient Instructions (Signed)
An overnight oxygen test will be obtained No other medication changes Will call with results Return as needed

## 2013-06-03 NOTE — Progress Notes (Signed)
Subjective:    Patient ID: Laurie Larsen, female    DOB: 28-Nov-1923, 80 y.o.   MRN: [id number]  HPI    06/03/2013 Chief Complaint  Patient presents with  . 6 month follow up    Has SOB spells "about every day" at rest.  Feels this has worsened over the past wk.  No cough.   Notes more dyspnea for one month.  Dry cough, no mucus. Pain in ribs worse after eating.  Does not choke on food. Notes difficultly laying flat with dyspnea   Review of Systems  Constitutional: Positive for appetite change and unexpected weight change. Negative for fever, chills, diaphoresis, activity change and fatigue.  HENT: Positive for congestion, postnasal drip, sinus pressure and sneezing. Negative for dental problem, ear discharge, ear pain, facial swelling, hearing loss, mouth sores, nosebleeds, rhinorrhea, sore throat, tinnitus, trouble swallowing and voice change.   Eyes: Negative for photophobia, discharge, itching and visual disturbance.  Respiratory: Positive for cough, chest tightness and shortness of breath. Negative for apnea, choking, wheezing and stridor.   Cardiovascular: Positive for palpitations. Negative for chest pain and leg swelling.  Gastrointestinal: Positive for abdominal pain. Negative for nausea, vomiting, constipation, blood in stool and abdominal distention.  Genitourinary: Negative for dysuria, urgency, frequency, hematuria, flank pain, decreased urine volume and difficulty urinating.  Musculoskeletal: Negative for arthralgias, back pain, gait problem, joint swelling, myalgias, neck pain and neck stiffness.  Skin: Negative for color change, pallor and rash.  Neurological: Negative for dizziness, tremors, seizures, syncope, speech difficulty, weakness, light-headedness, numbness and headaches.  Hematological: Negative for adenopathy. Does not bruise/bleed easily.  Psychiatric/Behavioral: Positive for dysphoric mood. Negative for confusion, sleep disturbance and agitation. The patient  is nervous/anxious.        Objective:   Physical Exam  Filed Vitals:   06/03/13 1128  BP: 112/70  Pulse: 74  Temp: 98.6 F (37 C)  TempSrc: Oral  Height: 5\' 4"  (1.626 m)  Weight: 104 lb 3.2 oz (47.265 kg)  SpO2: 91%    Gen: Pleasant, well-nourished, in no distress,  normal affect  ENT: No lesions,  mouth clear,  oropharynx clear, no postnasal drip  Neck: No JVD, no TMG, no carotid bruits  Lungs: No use of accessory muscles, no dullness to percussion, improved BS on R  Cardiovascular: RRR, heart sounds normal, no murmur or gallops, no peripheral edema  Abdomen: soft and NT, no HSM,  BS normal  Musculoskeletal: No deformities, no cyanosis or clubbing  Neuro: alert, non focal  Skin: Warm, no lesions or rashes  No results found.  All imaging reviewed       Assessment & Plan:   Pleural effusion Chronic R transudative pleural effusion d/t diastolic heart failure. No evident active heart failure.  Need  To rule out nocturnal desaturation  Plan ono RA No need to drain effusion further rov prn      Updated Medication List Outpatient Encounter Prescriptions as of 06/03/2013  Medication Sig  . Acetaminophen (TYLENOL EX ST ARTHRITIS PAIN PO) Take 1 tablet by mouth as needed. For Pain.  [name]. atenolol (TENORMIN) 50 MG tablet Take 0.5 tablets (25 mg total) by mouth 2 (two) times daily.  [name]. atorvastatin (LIPITOR) 40 MG tablet Take 0.5 tablets (20 mg total) by mouth at bedtime.  . Calcium-Vitamin D 600-200 MG-UNIT per tablet Take 1 tablet by mouth daily.  [name]. diltiazem (CARDIZEM CD) 180 MG 24 hr capsule TAKE 1 CAPSULE BY MOUTH DAILY EACH AM  . dorzolamide-timolol (COSOPT)  22.3-6.8 MG/ML ophthalmic solution Place 1 drop into both eyes 2 (two) times daily.   [name]. escitalopram (LEXAPRO) 10 MG tablet Take 10 mg by mouth. Three times weekly  . furosemide (LASIX) 80 MG tablet Take 40-80 mg by mouth daily.  [name]. HYDROcodone-acetaminophen (NORCO/VICODIN) 5-325 MG per tablet Take 1 tablet  by mouth as needed.  [name]. losartan (COZAAR) 100 MG tablet Take 1 tablet (100 mg total) by mouth at bedtime.  . Multiple Vitamins-Minerals (ICAPS AREDS FORMULA PO) Take 1 capsule by mouth 2 (two) times daily.   . potassium chloride (K-DUR,KLOR-CON) 10 MEQ tablet TAKE ONE TABLET BY MOUTH TWICE DAILY  . SYNTHROID 125 MCG tablet TAKE 1 TABLET BY MOUTH DAILY  . traMADol (ULTRAM) 50 MG tablet Take 1 tablet (50 mg total) by mouth as needed. For pain.  [name]. travoprost, benzalkonium, (TRAVATAN) 0.004 % ophthalmic solution 1 drop as directed.    . warfarin (COUMADIN) 5 MG tablet Take as directed by coumadin clinic  . [DISCONTINUED] furosemide (LASIX) 80 MG tablet Take 1 tablet (80 mg total) by mouth daily.

## 2013-06-04 NOTE — Assessment & Plan Note (Signed)
Chronic R transudative pleural effusion d/t diastolic heart failure. No evident active heart failure.  Need  To rule out nocturnal desaturation  Plan ono RA No need to drain effusion further rov prn

## 2013-06-08 ENCOUNTER — Other Ambulatory Visit: Payer: Self-pay | Admitting: Cardiology

## 2013-06-09 ENCOUNTER — Ambulatory Visit (INDEPENDENT_AMBULATORY_CARE_PROVIDER_SITE_OTHER): Payer: Medicare Other | Admitting: Cardiology

## 2013-06-09 ENCOUNTER — Encounter: Payer: Self-pay | Admitting: Cardiology

## 2013-06-09 VITALS — BP 154/89 | HR 72 | Ht 64.0 in | Wt 107.0 lb

## 2013-06-09 DIAGNOSIS — I4891 Unspecified atrial fibrillation: Secondary | ICD-10-CM

## 2013-06-09 DIAGNOSIS — I272 Pulmonary hypertension, unspecified: Secondary | ICD-10-CM | POA: Insufficient documentation

## 2013-06-09 DIAGNOSIS — I2789 Other specified pulmonary heart diseases: Secondary | ICD-10-CM

## 2013-06-09 DIAGNOSIS — I509 Heart failure, unspecified: Secondary | ICD-10-CM

## 2013-06-09 DIAGNOSIS — I5032 Chronic diastolic (congestive) heart failure: Secondary | ICD-10-CM

## 2013-06-09 DIAGNOSIS — I10 Essential (primary) hypertension: Secondary | ICD-10-CM

## 2013-06-09 MED ORDER — POTASSIUM CHLORIDE CRYS ER 10 MEQ PO TBCR: 10.0000 meq | EXTENDED_RELEASE_TABLET | Freq: Two times a day (BID) | ORAL | Status: DC

## 2013-06-09 NOTE — Progress Notes (Signed)
Laurie Larsen Date of Birth: 03-Apr-1923 Medical Record #[id number]  History of Present Illness: Ms. Laurie Larsen is seen back today for a follow up visit. She has multiple issues which include HLD, chronic atrial fib, chronic coumadin therapy, HTN, chronic diastolic dysfunction, CAD with known occlusion of a distal LCX branch from 2005, SSS with PPM in place (followed by Dr. Johney FrameAllred), chronic pain, HLD, and hypothyroidism. Did have pneumonia back in July of 2014 as well as right pleural effusion requiring thoracentesis - evaluation suggested a transudative process with some reactive mesothelial cells. Echo done in April showed normal LV and valvular function with moderate pulmonary HTN.   Since her last visit she still notes some edema in her ankles. Breathing is doing pretty well. She complains of occ. Sharp knife-like pains in her left chest radiating around her rib cage and into back. When this happens it takes her breath away for a few seconds. She takes her lasix most days but usually skips it when she is going out.  Current Outpatient Prescriptions  Medication Sig Dispense Refill  . Acetaminophen (TYLENOL EX ST ARTHRITIS PAIN PO) Take 1 tablet by mouth as needed. For Pain.      [name]. atenolol (TENORMIN) 50 MG tablet Take 0.5 tablets (25 mg total) by mouth 2 (two) times daily.  90 tablet  6  . atorvastatin (LIPITOR) 40 MG tablet Take 0.5 tablets (20 mg total) by mouth at bedtime.  90 tablet  3  . Calcium-Vitamin D 600-200 MG-UNIT per tablet Take 1 tablet by mouth daily.      [name]. diltiazem (CARDIZEM CD) 180 MG 24 hr capsule TAKE 1 CAPSULE BY MOUTH DAILY EACH AM      . dorzolamide-timolol (COSOPT) 22.3-6.8 MG/ML ophthalmic solution Place 1 drop into both eyes 2 (two) times daily.       [name]. escitalopram (LEXAPRO) 10 MG tablet Take 10 mg by mouth. Three times weekly      . furosemide (LASIX) 80 MG tablet Take 40-80 mg by mouth daily.      [name]. HYDROcodone-acetaminophen (NORCO/VICODIN) 5-325 MG per tablet Take  1 tablet by mouth as needed.      [name]. losartan (COZAAR) 100 MG tablet Take 1 tablet (100 mg total) by mouth at bedtime.  90 tablet  1  . Multiple Vitamins-Minerals (ICAPS AREDS FORMULA PO) Take 1 capsule by mouth 2 (two) times daily.       . potassium chloride (K-DUR,KLOR-CON) 10 MEQ tablet Take 1 tablet (10 mEq total) by mouth 2 (two) times daily.  180 tablet  6  . SYNTHROID 125 MCG tablet TAKE 1 TABLET BY MOUTH DAILY  90 tablet  1  . traMADol (ULTRAM) 50 MG tablet Take 1 tablet (50 mg total) by mouth as needed. For pain.  30 tablet  5  . travoprost, benzalkonium, (TRAVATAN) 0.004 % ophthalmic solution 1 drop as directed.        . warfarin (COUMADIN) 5 MG tablet Take as directed by coumadin clinic  40 tablet  3  . [DISCONTINUED] potassium chloride (KLOR-CON 10) 10 MEQ tablet Take 1 tablet (10 mEq total) by mouth 2 (two) times daily.  60 tablet  5   No current facility-administered medications for this visit.    Allergies  Allergen Reactions  . Amoxicillin     Weakness  . Digoxin     REACTION: weakness, dehydration  . Risedronate Sodium     REACTION: rash    Past Medical History  Diagnosis Date  .  Permanent atrial fibrillation   . HTN (hypertension)   . Diastolic dysfunction   . CAD (coronary artery disease)     W/occlusion of distal circumflex branch 2005  . Sick sinus syndrome     W/DDD pacemaker  . Chronic pain     ? due to arthritis  . Glaucoma   . Chronic anticoagulation   . Hyperlipidemia   . Diverticulitis large intestine      by history  . Allergy   . Phlebitis     after pacemeker placement  . Colon polyps   . Thyroid disease     hypothyroidism on medications  . Transfusion history     after childbirth 60 years ago (1957)  . CHF (congestive heart failure)     hospitalized 2010  . Rash/skin eruption     multiple episodes - wide distribution-intermittent, possibly drug rash.  . Pulmonary HTN 06/09/2013    Past Surgical History  Procedure Laterality Date  .  Appendectomy    . Abdominal hysterectomy    . Tonsillectomy    . Kidney surgery      Right  . Ankle fracture surgery      Left, pin  . Pacemaker insertion  06/20/08    MDT EnRhythm DR implanted by Dr Reyes IvanKersey  . Breast surgery      Needle guided excision, right breast calcification  . Breast fibroadenoma surgery  2010    Right  . Skin graft      left leg    History  Smoking status  . Never Smoker   Smokeless tobacco  . Never Used    History  Alcohol Use No    Family History  Problem Relation Age of Onset  . Cancer Mother     colon with mets  . Diabetes Mother   . Heart disease Father   . Hyperlipidemia Father   . Hypertension Father   . Stroke Brother     brother who died '11  . Cancer Paternal Aunt     breast  . Cancer Cousin     uterine, lung cancer    Review of Systems: The review of systems is per the HPI.  All other systems were reviewed and are negative.  Physical Exam: BP 154/89  Pulse 72  Ht 5\' 4"  (1.626 m)  Wt 107 lb (48.535 kg)  BMI 18.36 kg/m2 Patient is very pleasant and in no acute distress. She is frail.  Skin is warm and dry. Color is normal.  HEENT is unremarkable. Normocephalic/atraumatic. PERRL. Sclera are nonicteric. Neck is supple. No masses. No JVD. Lungs are clear. Cardiac exam shows an irregular rate and rhythm. Abdomen is soft. Extremities are with 1+ edema. Lower legs are very shiny and tight. No gross neurologic deficits noted.  Wt Readings from Last 3 Encounters:  06/09/13 107 lb (48.535 kg)  06/03/13 104 lb 3.2 oz (47.265 kg)  05/04/13 109 lb (49.442 kg)     LABORATORY DATA: BNP pending   Lab Results  Component Value Date   WBC 7.0 04/20/2013   HGB 12.9 04/20/2013   HCT 38.8 04/20/2013   PLT 212.0 04/20/2013   GLUCOSE 108* 04/20/2013   CHOL 118 03/26/2011   TRIG 105.0 03/26/2011   HDL 51.10 03/26/2011   LDLCALC 46 03/26/2011   ALT 27 03/26/2011   AST 23 03/26/2011   NA 138 04/20/2013   K 3.5 04/20/2013   CL 104 04/20/2013    CREATININE 0.7 04/20/2013   BUN 16 04/20/2013  CO2 27 04/20/2013   TSH 1.52 03/23/2013   INR 1.8* 05/31/2013    Lab Results  Component Value Date   INR 1.8* 05/31/2013   INR 3.3 05/18/2013   INR 2.6 04/19/2013    Dg Chest 2 View  04/20/2013    IMPRESSION: No change in small right-sided pleural effusion with adjacent atelectasis.  Cardiomegaly without congestive failure.   Electronically Signed   By: Jeronimo Greaves M.D.   On: 04/20/2013 15:38   Echo: 05/16/13:Study Conclusions  - Left ventricle: The cavity size was normal. There was mild concentric hypertrophy. Systolic function was normal. The estimated ejection fraction was in the range of 50% to 55%. Wall motion was normal; there were no regional wall motion abnormalities. The study was not technically sufficient to allow evaluation of LV diastolic dysfunction due to atrial fibrillation. - Aortic valve: No regurgitation. - Aortic root: The aortic root was normal in size. - Mitral valve: Calcified annulus. Mildly thickened leaflets . Trivial regurgitation. - Left atrium: The atrium was mildly dilated. - Right ventricle: Systolic function was mildly reduced. - Right atrium: The atrium was mildly to moderately dilated. Pacer wire or catheter noted in right atrium. - Tricuspid valve: Mild regurgitation. - Pulmonary arteries: Systolic pressure was mildly to moderately increased. PA peak pressure: 48mm Hg (S). - Pericardium, extracardiac: There was no pericardial effusion.    Assessment / Plan: 1. Dyspnea - secondary to diastolic HF and pulmonary HTN - Recommend she take  lasix daily - emphasized again the importance and to restrict salt. Will follow up in 3 months.   2. HTN - BP fair.   3. Chronic atrial  - with underlying PPM - recent check was stable.   4. Advanced age - frail.  5. Atypical chest pain- unclear etiology but I don't think it is cardiac.

## 2013-06-09 NOTE — Patient Instructions (Signed)
Continue your current therapy. Be sure to take your diuretic daily and avoid salt in your diet.  I will see you in 3 months.

## 2013-06-22 ENCOUNTER — Ambulatory Visit: Payer: Medicare Other | Admitting: Critical Care Medicine

## 2013-06-22 LAB — PROTIME-INR: INR: 4 — AB (ref 0.9–1.1)

## 2013-06-23 ENCOUNTER — Ambulatory Visit (INDEPENDENT_AMBULATORY_CARE_PROVIDER_SITE_OTHER): Payer: Medicare Other | Admitting: Cardiology

## 2013-06-23 DIAGNOSIS — I4891 Unspecified atrial fibrillation: Secondary | ICD-10-CM

## 2013-06-23 DIAGNOSIS — Z5181 Encounter for therapeutic drug level monitoring: Secondary | ICD-10-CM

## 2013-06-30 ENCOUNTER — Telehealth: Payer: Self-pay | Admitting: Cardiology

## 2013-06-30 ENCOUNTER — Ambulatory Visit (INDEPENDENT_AMBULATORY_CARE_PROVIDER_SITE_OTHER): Payer: Medicare Other

## 2013-06-30 DIAGNOSIS — Z5181 Encounter for therapeutic drug level monitoring: Secondary | ICD-10-CM

## 2013-06-30 DIAGNOSIS — I4891 Unspecified atrial fibrillation: Secondary | ICD-10-CM

## 2013-06-30 LAB — POCT INR: INR: 2.8

## 2013-06-30 NOTE — Telephone Encounter (Signed)
Spoke with pt, pt had INR done yesterday 06/29/13 awaiting results.  Pt has been started on a zpack x 5 days on 06/29/13.  Advised pt abx can cause INR to elevate slightly, no significant interaction.  Will await INR results from yesterday and dose accordingly.

## 2013-06-30 NOTE — Telephone Encounter (Signed)
New message     Pt is taking azithromycin and is on coumadin.  Is this ok?

## 2013-07-11 ENCOUNTER — Other Ambulatory Visit: Payer: Self-pay | Admitting: Cardiology

## 2013-07-12 ENCOUNTER — Telehealth: Payer: Self-pay | Admitting: Critical Care Medicine

## 2013-07-12 DIAGNOSIS — J90 Pleural effusion, not elsewhere classified: Secondary | ICD-10-CM

## 2013-07-12 DIAGNOSIS — R0602 Shortness of breath: Secondary | ICD-10-CM

## 2013-07-12 NOTE — Telephone Encounter (Signed)
ONO positive for desat , pt will need home oxygen Order this from [facility]  2L QHS

## 2013-07-13 LAB — PROTIME-INR: INR: 2.4 — AB (ref 0.9–1.1)

## 2013-07-13 NOTE — Telephone Encounter (Signed)
lmomtcb for pt  Order placed - will route msg to [facility] as msg was created in [place] office.  Please ensure order is sent. Will also hold in my box to ensure pt is advised of results and recs.

## 2013-07-14 ENCOUNTER — Ambulatory Visit (INDEPENDENT_AMBULATORY_CARE_PROVIDER_SITE_OTHER): Payer: Medicare Other | Admitting: Cardiovascular Disease

## 2013-07-14 DIAGNOSIS — I4891 Unspecified atrial fibrillation: Secondary | ICD-10-CM

## 2013-07-14 DIAGNOSIS — Z5181 Encounter for therapeutic drug level monitoring: Secondary | ICD-10-CM

## 2013-07-14 NOTE — Telephone Encounter (Signed)
Called, spoke with pt.  Informed her of below results and recs per Dr. Delford FieldWright.  She verbalized understanding and is aware [facility] will be contacting her to deliver and set up o2.  She voiced no further questions or concerns at this time and is to call back if she doesn't hear from [facility] by tomorrow.    [facility], o2 order has been placed.  Is in the [place] box as phone msg was created in [place].  Will you please ensure order is sent?  Thank you.

## 2013-07-20 ENCOUNTER — Telehealth: Payer: Self-pay | Admitting: Critical Care Medicine

## 2013-07-20 DIAGNOSIS — J90 Pleural effusion, not elsewhere classified: Secondary | ICD-10-CM

## 2013-07-20 NOTE — Telephone Encounter (Signed)
Per PW- ONO was pos for desat- needs to start noct o2 2lpm  LMTCB for the pt and results placed in PW scan folder

## 2013-07-21 NOTE — Telephone Encounter (Signed)
lmomtcb for pt on home and cell #s Order placed for o2.

## 2013-07-22 NOTE — Telephone Encounter (Signed)
ATC pt x 3 - line busy.  WBC

## 2013-07-22 NOTE — Telephone Encounter (Signed)
Called, spoke with pt -- Informed her of below.  She verbalized understanding and is aware [facility] will be contacting her to set this up.   Pt states she will be out for a little while today and asking for [facility] to contact her ASAP.  Called, spoke with Selena BattenKim with [facility].  She will contact pt this morning.    ATC pt inform her of above per Selena BattenKim.  Line busy x 5.  WCB

## 2013-07-22 NOTE — Telephone Encounter (Signed)
ATC pt's home # - line busy x 3 lmomtcb on cell #

## 2013-07-26 ENCOUNTER — Other Ambulatory Visit: Payer: Self-pay | Admitting: *Deleted

## 2013-07-26 MED ORDER — WARFARIN SODIUM 5 MG PO TABS: ORAL_TABLET | ORAL | Status: DC

## 2013-07-26 NOTE — Telephone Encounter (Signed)
Called, spoke with pt to follow up on o2 being set up. Pt reports [facility] did set o2 up on Friday.   She has no further questions or concerns at this time and is to call back if needed.

## 2013-07-27 ENCOUNTER — Encounter: Payer: Self-pay | Admitting: Critical Care Medicine

## 2013-08-03 LAB — PROTIME-INR: INR: 2.6 — AB (ref 0.9–1.1)

## 2013-08-04 ENCOUNTER — Ambulatory Visit (INDEPENDENT_AMBULATORY_CARE_PROVIDER_SITE_OTHER): Payer: BLUE CROSS/BLUE SHIELD | Admitting: Cardiology

## 2013-08-04 DIAGNOSIS — Z5181 Encounter for therapeutic drug level monitoring: Secondary | ICD-10-CM

## 2013-08-04 DIAGNOSIS — I4891 Unspecified atrial fibrillation: Secondary | ICD-10-CM

## 2013-08-19 LAB — HM MAMMOGRAPHY

## 2013-08-31 LAB — PROTIME-INR: INR: 3.3 — AB (ref 0.9–1.1)

## 2013-09-01 ENCOUNTER — Ambulatory Visit (INDEPENDENT_AMBULATORY_CARE_PROVIDER_SITE_OTHER): Payer: BLUE CROSS/BLUE SHIELD | Admitting: Cardiovascular Disease

## 2013-09-01 DIAGNOSIS — Z5181 Encounter for therapeutic drug level monitoring: Secondary | ICD-10-CM

## 2013-09-01 DIAGNOSIS — I4891 Unspecified atrial fibrillation: Secondary | ICD-10-CM

## 2013-09-12 ENCOUNTER — Inpatient Hospital Stay: Payer: Self-pay | Admitting: Internal Medicine

## 2013-09-12 DIAGNOSIS — I509 Heart failure, unspecified: Secondary | ICD-10-CM

## 2013-09-12 DIAGNOSIS — I5033 Acute on chronic diastolic (congestive) heart failure: Secondary | ICD-10-CM

## 2013-09-12 DIAGNOSIS — I4891 Unspecified atrial fibrillation: Secondary | ICD-10-CM

## 2013-09-12 DIAGNOSIS — J90 Pleural effusion, not elsewhere classified: Secondary | ICD-10-CM

## 2013-09-12 LAB — CK TOTAL AND CKMB (NOT AT ARMC)
CK, Total: 62 U/L
CK, Total: 43 U/L
CK-MB: 1.7 ng/mL (ref 0.5–3.6)
CK-MB: 1.8 ng/mL (ref 0.5–3.6)

## 2013-09-12 LAB — PROTIME-INR
INR: 2.2
Prothrombin Time: 24.2 secs — ABNORMAL HIGH (ref 11.5–14.7)

## 2013-09-12 LAB — TROPONIN I

## 2013-09-12 LAB — CBC
HCT: 39.7 % (ref 35.0–47.0)
HGB: 13.2 g/dL (ref 12.0–16.0)
MCH: 30.5 pg (ref 26.0–34.0)
MCHC: 33.2 g/dL (ref 32.0–36.0)
MCV: 92 fL (ref 80–100)
Platelet: 194 10*3/uL (ref 150–440)
RBC: 4.32 10*6/uL (ref 3.80–5.20)
RDW: 14.1 % (ref 11.5–14.5)
WBC: 8.9 10*3/uL (ref 3.6–11.0)

## 2013-09-12 LAB — BASIC METABOLIC PANEL
Anion Gap: 10 (ref 7–16)
BUN: 14 mg/dL (ref 7–18)
CALCIUM: 8.4 mg/dL — AB (ref 8.5–10.1)
CHLORIDE: 106 mmol/L (ref 98–107)
CO2: 25 mmol/L (ref 21–32)
CREATININE: 0.67 mg/dL (ref 0.60–1.30)
GLUCOSE: 109 mg/dL — AB (ref 65–99)
OSMOLALITY: 282 (ref 275–301)
Potassium: 3.5 mmol/L (ref 3.5–5.1)
SODIUM: 141 mmol/L (ref 136–145)

## 2013-09-12 LAB — PRO B NATRIURETIC PEPTIDE: B-TYPE NATIURETIC PEPTID: 7658 pg/mL — AB (ref 0–450)

## 2013-09-13 ENCOUNTER — Other Ambulatory Visit: Payer: Self-pay | Admitting: Cardiology

## 2013-09-13 LAB — CBC WITH DIFFERENTIAL/PLATELET
BASOS ABS: 0.1 10*3/uL (ref 0.0–0.1)
BASOS PCT: 1.2 %
EOS PCT: 1.9 %
Eosinophil #: 0.1 10*3/uL (ref 0.0–0.7)
HCT: 38.2 % (ref 35.0–47.0)
HGB: 12.9 g/dL (ref 12.0–16.0)
LYMPHS ABS: 0.7 10*3/uL — AB (ref 1.0–3.6)
LYMPHS PCT: 9.9 %
MCH: 31 pg (ref 26.0–34.0)
MCHC: 33.8 g/dL (ref 32.0–36.0)
MCV: 92 fL (ref 80–100)
MONO ABS: 0.7 x10 3/mm (ref 0.2–0.9)
MONOS PCT: 10.1 %
NEUTROS ABS: 5.5 10*3/uL (ref 1.4–6.5)
Neutrophil %: 76.9 %
Platelet: 194 10*3/uL (ref 150–440)
RBC: 4.16 10*6/uL (ref 3.80–5.20)
RDW: 14.2 % (ref 11.5–14.5)
WBC: 7.1 10*3/uL (ref 3.6–11.0)

## 2013-09-13 LAB — BASIC METABOLIC PANEL
ANION GAP: 10 (ref 7–16)
BUN: 18 mg/dL (ref 7–18)
CO2: 25 mmol/L (ref 21–32)
Calcium, Total: 7.9 mg/dL — ABNORMAL LOW (ref 8.5–10.1)
Chloride: 105 mmol/L (ref 98–107)
Creatinine: 0.6 mg/dL (ref 0.60–1.30)
EGFR (African American): >60
EGFR (Non-African Amer.): >60
Glucose: 90 mg/dL (ref 65–99)
OSMOLALITY: 281 (ref 275–301)
Potassium: 3.2 mmol/L — ABNORMAL LOW (ref 3.5–5.1)
Sodium: 140 mmol/L (ref 136–145)

## 2013-09-13 LAB — PROTIME-INR
INR: 2
Prothrombin Time: 22.3 secs — ABNORMAL HIGH (ref 11.5–14.7)

## 2013-09-13 LAB — TSH: Thyroid Stimulating Horm: 0.62 u[IU]/mL

## 2013-09-13 LAB — LIPID PANEL
Cholesterol: 91 mg/dL (ref 0–200)
HDL: 42 mg/dL (ref 40–60)
LDL CHOLESTEROL, CALC: 33 mg/dL (ref 0–100)
TRIGLYCERIDES: 79 mg/dL (ref 0–200)
VLDL Cholesterol, Calc: 16 mg/dL (ref 5–40)

## 2013-09-14 ENCOUNTER — Ambulatory Visit: Payer: Medicare Other | Admitting: Cardiology

## 2013-09-14 LAB — CBC WITH DIFFERENTIAL/PLATELET
BASOS PCT: 1.3 %
Basophil #: 0.1 10*3/uL (ref 0.0–0.1)
Eosinophil #: 0.2 10*3/uL (ref 0.0–0.7)
Eosinophil %: 2.7 %
HCT: 39.7 % (ref 35.0–47.0)
HGB: 13.1 g/dL (ref 12.0–16.0)
LYMPHS PCT: 12.3 %
Lymphocyte #: 1 10*3/uL (ref 1.0–3.6)
MCH: 30.6 pg (ref 26.0–34.0)
MCHC: 33 g/dL (ref 32.0–36.0)
MCV: 93 fL (ref 80–100)
MONO ABS: 1 x10 3/mm — AB (ref 0.2–0.9)
MONOS PCT: 12 %
Neutrophil #: 6 10*3/uL (ref 1.4–6.5)
Neutrophil %: 71.7 %
Platelet: 220 10*3/uL (ref 150–440)
RBC: 4.28 10*6/uL (ref 3.80–5.20)
RDW: 14.1 % (ref 11.5–14.5)
WBC: 8.3 10*3/uL (ref 3.6–11.0)

## 2013-09-14 LAB — BASIC METABOLIC PANEL
ANION GAP: 7 (ref 7–16)
BUN: 28 mg/dL — AB (ref 7–18)
CHLORIDE: 101 mmol/L (ref 98–107)
Calcium, Total: 8.3 mg/dL — ABNORMAL LOW (ref 8.5–10.1)
Co2: 30 mmol/L (ref 21–32)
Creatinine: 1.02 mg/dL (ref 0.60–1.30)
EGFR (African American): 57 — ABNORMAL LOW
EGFR (Non-African Amer.): 49 — ABNORMAL LOW
Glucose: 102 mg/dL — ABNORMAL HIGH (ref 65–99)
OSMOLALITY: 281 (ref 275–301)
Potassium: 3.6 mmol/L (ref 3.5–5.1)
Sodium: 138 mmol/L (ref 136–145)

## 2013-09-14 LAB — PROTIME-INR
INR: 1.9
Prothrombin Time: 21 secs — ABNORMAL HIGH (ref 11.5–14.7)

## 2013-09-15 LAB — BASIC METABOLIC PANEL
Anion Gap: 9 (ref 7–16)
BUN: 38 mg/dL — AB (ref 7–18)
CHLORIDE: 98 mmol/L (ref 98–107)
CO2: 29 mmol/L (ref 21–32)
CREATININE: 1.14 mg/dL (ref 0.60–1.30)
Calcium, Total: 8.1 mg/dL — ABNORMAL LOW (ref 8.5–10.1)
EGFR (African American): 50 — ABNORMAL LOW
EGFR (Non-African Amer.): 43 — ABNORMAL LOW
Glucose: 98 mg/dL (ref 65–99)
Osmolality: 281 (ref 275–301)
POTASSIUM: 4 mmol/L (ref 3.5–5.1)
Sodium: 136 mmol/L (ref 136–145)

## 2013-09-15 LAB — PROTIME-INR
INR: 1.8
Prothrombin Time: 20.4 secs — ABNORMAL HIGH (ref 11.5–14.7)

## 2013-09-15 LAB — CBC WITH DIFFERENTIAL/PLATELET
Basophil #: 0.1 10*3/uL (ref 0.0–0.1)
Basophil %: 1.2 %
Eosinophil #: 0.3 10*3/uL (ref 0.0–0.7)
Eosinophil %: 4.2 %
HCT: 36 % (ref 35.0–47.0)
HGB: 11.8 g/dL — ABNORMAL LOW (ref 12.0–16.0)
LYMPHS PCT: 12.6 %
Lymphocyte #: 1 10*3/uL (ref 1.0–3.6)
MCH: 30.5 pg (ref 26.0–34.0)
MCHC: 32.9 g/dL (ref 32.0–36.0)
MCV: 93 fL (ref 80–100)
MONO ABS: 1 x10 3/mm — AB (ref 0.2–0.9)
Monocyte %: 12.4 %
NEUTROS ABS: 5.5 10*3/uL (ref 1.4–6.5)
Neutrophil %: 69.6 %
Platelet: 204 10*3/uL (ref 150–440)
RBC: 3.88 10*6/uL (ref 3.80–5.20)
RDW: 14 % (ref 11.5–14.5)
WBC: 7.9 10*3/uL (ref 3.6–11.0)

## 2013-09-16 ENCOUNTER — Telehealth: Payer: Self-pay

## 2013-09-16 DIAGNOSIS — I5031 Acute diastolic (congestive) heart failure: Secondary | ICD-10-CM

## 2013-09-16 LAB — BASIC METABOLIC PANEL
ANION GAP: 9 (ref 7–16)
BUN: 28 mg/dL — ABNORMAL HIGH (ref 7–18)
CALCIUM: 8.3 mg/dL — AB (ref 8.5–10.1)
CHLORIDE: 100 mmol/L (ref 98–107)
CREATININE: 0.92 mg/dL (ref 0.60–1.30)
Co2: 27 mmol/L (ref 21–32)
EGFR (African American): >60
GFR CALC NON AF AMER: 56 — AB
Glucose: 87 mg/dL (ref 65–99)
OSMOLALITY: 277 (ref 275–301)
Potassium: 4.1 mmol/L (ref 3.5–5.1)
Sodium: 136 mmol/L (ref 136–145)

## 2013-09-16 LAB — CBC WITH DIFFERENTIAL/PLATELET
Basophil #: 0.1 10*3/uL (ref 0.0–0.1)
Basophil %: 1.2 %
EOS ABS: 0.3 10*3/uL (ref 0.0–0.7)
EOS PCT: 4.5 %
HCT: 36.5 % (ref 35.0–47.0)
HGB: 12.4 g/dL (ref 12.0–16.0)
Lymphocyte #: 1.1 10*3/uL (ref 1.0–3.6)
Lymphocyte %: 15.1 %
MCH: 31.3 pg (ref 26.0–34.0)
MCHC: 34.1 g/dL (ref 32.0–36.0)
MCV: 92 fL (ref 80–100)
MONO ABS: 0.9 x10 3/mm (ref 0.2–0.9)
Monocyte %: 12.4 %
Neutrophil #: 4.8 10*3/uL (ref 1.4–6.5)
Neutrophil %: 66.8 %
Platelet: 224 10*3/uL (ref 150–440)
RBC: 3.97 10*6/uL (ref 3.80–5.20)
RDW: 14 % (ref 11.5–14.5)
WBC: 7.1 10*3/uL (ref 3.6–11.0)

## 2013-09-16 NOTE — Telephone Encounter (Signed)
Attempted to contact pt [facility]16[facility]13. Left detailed message asking pt to call back w/ any questions about her discharge instructions or medications.

## 2013-09-23 ENCOUNTER — Other Ambulatory Visit: Payer: Self-pay

## 2013-09-23 DIAGNOSIS — Z1231 Encounter for screening mammogram for malignant neoplasm of breast: Secondary | ICD-10-CM

## 2013-09-26 ENCOUNTER — Encounter: Payer: Self-pay | Admitting: Nurse Practitioner

## 2013-09-26 ENCOUNTER — Encounter (INDEPENDENT_AMBULATORY_CARE_PROVIDER_SITE_OTHER): Payer: Self-pay

## 2013-09-26 ENCOUNTER — Ambulatory Visit (INDEPENDENT_AMBULATORY_CARE_PROVIDER_SITE_OTHER): Payer: Medicare Other | Admitting: Nurse Practitioner

## 2013-09-26 VITALS — BP 122/62 | HR 76 | Ht 63.0 in | Wt 104.5 lb

## 2013-09-26 DIAGNOSIS — J90 Pleural effusion, not elsewhere classified: Secondary | ICD-10-CM

## 2013-09-26 DIAGNOSIS — I10 Essential (primary) hypertension: Secondary | ICD-10-CM

## 2013-09-26 DIAGNOSIS — I5032 Chronic diastolic (congestive) heart failure: Secondary | ICD-10-CM

## 2013-09-26 DIAGNOSIS — I4891 Unspecified atrial fibrillation: Secondary | ICD-10-CM

## 2013-09-26 NOTE — Patient Instructions (Signed)
Your physician recommends that you schedule a follow-up appointment in: 1 month w/ Dr. Swaziland or Shawn Route, NP in [place]

## 2013-09-26 NOTE — Progress Notes (Signed)
Patient Name: Laurie Larsen Date of Encounter: 09/26/2013  Primary Care Provider:  Mickie Hillier, MD Primary Cardiologist:  P. Swaziland, MD   Patient Profile  80 y/o female with a h/o chronic diast chf, afib, and persistent R pleural effusion who presents today after recent hospitalization 2/2 multifactorial dyspnea.  Problem List   Past Medical History  Diagnosis Date  . Permanent atrial fibrillation     a. Chronic Coumadin.  [name] HTN (hypertension)   . Chronic diastolic CHF (congestive heart failure), NYHA class 1     a. 04/2013 Echo: EF 50-55%, triv MR, mildly dil LA, PASP , mild TR, mild-mod RA dil.  [name] CAD (coronary artery disease)     a. 2005 Cath: 100% dLCX-->Med managed.  . Sick sinus syndrome     a. 05/2008 s/p MDT EnRhythm DC PPM (Allred).  . Chronic pain     a. ? due to arthritis  . Glaucoma   . Hyperlipidemia   . Diverticulitis large intestine   . Allergy   . Phlebitis     a. after pacemeker placement in 2010.  [name] Colon polyps   . Hypothyroidism   . Transfusion history     a. After childbirth 60 years ago (1957)  . Rash/skin eruption     a. Multiple episodes - wide distribution-intermittent, possibly drug rash.  . Pulmonary HTN     a. PASP on echo 04/2013.  [name] Chronic Right Pleural Effusion     a. 07/2012 s/p thoracentesis - Followed by Dr. Delford Field [facility].   Past Surgical History  Procedure Laterality Date  . Appendectomy    . Abdominal hysterectomy    . Tonsillectomy    . Kidney surgery      Right  . Ankle fracture surgery      Left, pin  . Pacemaker insertion  06/20/08    MDT EnRhythm DR implanted by Dr Reyes Ivan  . Breast surgery      Needle guided excision, right breast calcification  . Breast fibroadenoma surgery  2010    Right  . Skin graft      left leg    Allergies  Allergies  Allergen Reactions  . Amoxicillin     Weakness  . Digoxin     REACTION: weakness, dehydration  . Risedronate Sodium     REACTION:  rash    HPI  80 y/o female with the above problem list.  She was recently admitted to [facility] 2/2 progressive dyspnea and cough.  CXR showed vascular congestion with moderate R pleural effusion.  She was placed on IV lasix with slow but steady improvement.  We held her coumadin b/c we initially thought that she would required repeat right sided thoracentesis, however with diuresis, she had symptomatic improvement and f/u cxr also showed improvement.  As a result, she was able to be discharged w/o thoracentesis.  Since d/c, she has been more diligent in taking her lasix as prescribed.  She had previously admitted to skipping doses dependent upon her social calendar.  She has also been more careful about sodium in her diet and has been weighing herself daily.  Her wt has been stable and she has not required O2 during the day.  She continues to wear O2 @ night.  She denies chest pain, palpitations, pnd, orthopnea, n, v, dizziness, syncope, edema, weight gain, or early satiety.   Home Medications  Prior to Admission medications   Medication Sig Start Date End Date Taking? Authorizing Provider  Acetaminophen (TYLENOL EX ST ARTHRITIS PAIN PO) Take 1 tablet by mouth as needed. For Pain.   Yes Historical Provider, MD  atenolol (TENORMIN) 50 MG tablet Take 0.5 tablets (25 mg total) by mouth 2 (two) times daily. 09/30/12  Yes Peter M Swaziland, MD  atorvastatin (LIPITOR) 40 MG tablet TAKE 1/2 TABLET BY MOUTH AT BEDTIME 09/14/13  Yes Peter M Swaziland, MD  Calcium-Vitamin D 600-200 MG-UNIT per tablet Take 1 tablet by mouth daily.   Yes Historical Provider, MD  diltiazem (CARDIZEM CD) 180 MG 24 hr capsule TAKE 1 CAPSULE BY MOUTH DAILY   Yes Peter M Swaziland, MD  dorzolamide-timolol (COSOPT) 22.3-6.8 MG/ML ophthalmic solution Place 1 drop into both eyes 2 (two) times daily.    Yes Historical Provider, MD  escitalopram (LEXAPRO) 10 MG tablet Take 10 mg by mouth. Three times weekly 04/05/12  Yes Historical Provider, MD    furosemide (LASIX) 80 MG tablet Take 40-80 mg by mouth daily. 04/20/13  Yes Hillis Range, MD  HYDROcodone-acetaminophen (NORCO/VICODIN) 5-325 MG per tablet Take 1 tablet by mouth as needed. 10/26/12  Yes Historical Provider, MD  losartan (COZAAR) 100 MG tablet Take 50 mg by mouth at bedtime. 06/02/13  Yes Peter M Swaziland, MD  Multiple Vitamins-Minerals (ICAPS AREDS FORMULA PO) Take 1 capsule by mouth 2 (two) times daily.    Yes Historical Provider, MD  potassium chloride (K-DUR,KLOR-CON) 10 MEQ tablet Take 1 tablet (10 mEq total) by mouth 2 (two) times daily. 06/09/13  Yes Peter M Swaziland, MD  SYNTHROID 125 MCG tablet TAKE 1 TABLET BY MOUTH DAILY 08/04/11  Yes Romero Belling, MD  traMADol (ULTRAM) 50 MG tablet Take 1 tablet (50 mg total) by mouth as needed. For pain. 12/23/10  Yes Jacques Navy, MD  travoprost, benzalkonium, (TRAVATAN) 0.004 % ophthalmic solution 1 drop as directed.     Yes Historical Provider, MD  warfarin (COUMADIN) 5 MG tablet Take as directed by coumadin clinic 07/26/13  Yes Peter M Swaziland, MD    Review of Systems  Doing well as above.  She does have some degree of chronic DOE, however this has been stable.  She denies chest pain, palpitations, pnd, orthopnea, n, v, dizziness, syncope, edema, weight gain, or early satiety. .  All other systems reviewed and are otherwise negative except as noted above.  Physical Exam  Blood pressure 122/62, pulse 76, height  (1.6 m), weight 104 lb 8 oz (47.401 kg).  General: Pleasant, NAD Psych: Normal affect. Neuro: Alert and oriented X 3. Moves all extremities spontaneously. HEENT: Normal  Neck: Supple without bruits or JVD. Lungs:  Resp regular and unlabored.  Diminished breath sounds @ right base, otw CTA. Heart: IR, IR, no s3, s4, murmurs. Abdomen: Soft, non-tender, non-distended, BS + x 4.  Extremities: No clubbing, cyanosis.  Trace bilat LE edema. DP/PT/Radials 2+ and equal bilaterally.  Accessory Clinical Findings  ECG - A Fib  with pacing on demand, 77, no acute st/t changes.  Assessment & Plan  1.  Chronic Diastolic CHF:  She is doing well today.  Wt has been stable @ home and she is taking her lasix as prescribed.  HR/BP stable.  She is not having to wear O2 during daytime hours. Cont current regimen.  2.  Persistent R Pleural Effusion:  Breath sounds stable on exam with diminished sounds in the right base only (was diminished 1/2 up upon admission recently).  As above, not requiring O2 during the day. She is followed by Dr.  Delford Field.  3.  HTN:  Stable.  4.  Permanent Afib: Rate controlled.  Chronic coumadin.  5.  Dispo:  F/U with Dr. Swaziland or L. Tyrone Sage, NP in 1 month.   Nicolasa Ducking, NP 09/26/2013, 3:51 PM

## 2013-09-28 ENCOUNTER — Ambulatory Visit (INDEPENDENT_AMBULATORY_CARE_PROVIDER_SITE_OTHER): Payer: Medicare Other

## 2013-09-28 DIAGNOSIS — Z23 Encounter for immunization: Secondary | ICD-10-CM

## 2013-09-29 LAB — PROTIME-INR: INR: 2.3 — AB (ref 0.9–1.1)

## 2013-09-30 ENCOUNTER — Ambulatory Visit (INDEPENDENT_AMBULATORY_CARE_PROVIDER_SITE_OTHER): Payer: BLUE CROSS/BLUE SHIELD | Admitting: Cardiology

## 2013-09-30 DIAGNOSIS — I4891 Unspecified atrial fibrillation: Secondary | ICD-10-CM

## 2013-09-30 DIAGNOSIS — Z5181 Encounter for therapeutic drug level monitoring: Secondary | ICD-10-CM

## 2013-10-12 ENCOUNTER — Ambulatory Visit: Payer: Self-pay | Admitting: Family

## 2013-10-17 ENCOUNTER — Ambulatory Visit (INDEPENDENT_AMBULATORY_CARE_PROVIDER_SITE_OTHER): Payer: Medicare Other | Admitting: *Deleted

## 2013-10-17 DIAGNOSIS — I4891 Unspecified atrial fibrillation: Secondary | ICD-10-CM

## 2013-10-17 DIAGNOSIS — I482 Chronic atrial fibrillation, unspecified: Secondary | ICD-10-CM

## 2013-10-17 LAB — MDC_IDC_ENUM_SESS_TYPE_INCLINIC
Brady Statistic RV Percent Paced: 58.93 %
Date Time Interrogation Session: 20150921114616
Lead Channel Sensing Intrinsic Amplitude: 7.2442
Lead Channel Setting Pacing Amplitude: 2.5 V
MDC IDC MSMT BATTERY VOLTAGE: 2.9 V
MDC IDC MSMT LEADCHNL RA IMPEDANCE VALUE: 480 Ohm
MDC IDC MSMT LEADCHNL RA SENSING INTR AMPL: 0.8494
MDC IDC MSMT LEADCHNL RV IMPEDANCE VALUE: 408 Ohm
MDC IDC SET LEADCHNL RV PACING PULSEWIDTH: 0.4 ms
MDC IDC SET LEADCHNL RV SENSING SENSITIVITY: 0.9 mV
Zone Setting Detection Interval: 350 ms
Zone Setting Detection Interval: 400 ms

## 2013-10-17 NOTE — Progress Notes (Signed)
Pacemaker check in clinic. Normal device function. Thresholds, sensing, impedances consistent with previous measurements. Device programmed to maximize longevity.  A-fib, + coumadin.   1 high ventricular rates noted. Device programmed at appropriate safety margins. Histogram distribution appropriate for patient activity level. Device programmed to optimize intrinsic conduction. Patient education completed.  ROV 6 months with Dr. Johney Frame.

## 2013-10-18 ENCOUNTER — Ambulatory Visit: Payer: BLUE CROSS/BLUE SHIELD

## 2013-10-18 LAB — PROTIME-INR: INR: 3.3 — AB (ref 0.9–1.1)

## 2013-10-19 ENCOUNTER — Ambulatory Visit (INDEPENDENT_AMBULATORY_CARE_PROVIDER_SITE_OTHER): Payer: BLUE CROSS/BLUE SHIELD | Admitting: Pharmacist

## 2013-10-19 DIAGNOSIS — Z5181 Encounter for therapeutic drug level monitoring: Secondary | ICD-10-CM

## 2013-10-19 DIAGNOSIS — I4891 Unspecified atrial fibrillation: Secondary | ICD-10-CM

## 2013-10-20 ENCOUNTER — Encounter: Payer: Self-pay | Admitting: Internal Medicine

## 2013-10-26 ENCOUNTER — Ambulatory Visit
Admission: RE | Admit: 2013-10-26 | Discharge: 2013-10-26 | Disposition: A | Discharge status: Home / Self Care | Payer: Medicare Other | Source: Ambulatory Visit

## 2013-10-26 DIAGNOSIS — Z1231 Encounter for screening mammogram for malignant neoplasm of breast: Secondary | ICD-10-CM

## 2013-11-02 ENCOUNTER — Ambulatory Visit: Payer: Self-pay | Admitting: Family

## 2013-11-03 LAB — PROTIME-INR: INR: 1.7 — AB (ref 0.9–1.1)

## 2013-11-04 ENCOUNTER — Ambulatory Visit (INDEPENDENT_AMBULATORY_CARE_PROVIDER_SITE_OTHER): Payer: Medicare Other | Admitting: Interventional Cardiology

## 2013-11-04 DIAGNOSIS — Z5181 Encounter for therapeutic drug level monitoring: Secondary | ICD-10-CM

## 2013-11-04 DIAGNOSIS — I4891 Unspecified atrial fibrillation: Secondary | ICD-10-CM

## 2013-11-06 ENCOUNTER — Emergency Department: Payer: Self-pay | Admitting: Emergency Medicine

## 2013-11-06 LAB — URINALYSIS, COMPLETE
BACTERIA: NONE SEEN
BILIRUBIN, UR: NEGATIVE
BLOOD: NEGATIVE
Glucose,UR: NEGATIVE mg/dL (ref 0–75)
Ketone: NEGATIVE
Leukocyte Esterase: NEGATIVE
Nitrite: NEGATIVE
PH: 7 (ref 4.5–8.0)
Protein: NEGATIVE
RBC,UR: <1 /HPF (ref 0–5)
Specific Gravity: 1.008 (ref 1.003–1.030)
Squamous Epithelial: NONE SEEN
WBC UR: NONE SEEN /HPF (ref 0–5)

## 2013-11-06 LAB — PROTIME-INR
INR: 2.3
Prothrombin Time: 24.6 secs — ABNORMAL HIGH (ref 11.5–14.7)

## 2013-11-06 LAB — BASIC METABOLIC PANEL
Anion Gap: 9 (ref 7–16)
BUN: 14 mg/dL (ref 7–18)
CALCIUM: 7.7 mg/dL — AB (ref 8.5–10.1)
CO2: 24 mmol/L (ref 21–32)
Chloride: 107 mmol/L (ref 98–107)
Creatinine: 0.69 mg/dL (ref 0.60–1.30)
GLUCOSE: 116 mg/dL — AB (ref 65–99)
Osmolality: 281 (ref 275–301)
Potassium: 3 mmol/L — ABNORMAL LOW (ref 3.5–5.1)
SODIUM: 140 mmol/L (ref 136–145)

## 2013-11-06 LAB — CBC WITH DIFFERENTIAL/PLATELET
BASOS ABS: 0.1 10*3/uL (ref 0.0–0.1)
Basophil %: 1.2 %
Eosinophil #: 0.1 10*3/uL (ref 0.0–0.7)
Eosinophil %: 1.6 %
HCT: 36.3 % (ref 35.0–47.0)
HGB: 11.5 g/dL — ABNORMAL LOW (ref 12.0–16.0)
LYMPHS ABS: 0.5 10*3/uL — AB (ref 1.0–3.6)
LYMPHS PCT: 8 %
MCH: 29.6 pg (ref 26.0–34.0)
MCHC: 31.8 g/dL — ABNORMAL LOW (ref 32.0–36.0)
MCV: 93 fL (ref 80–100)
MONO ABS: 0.6 x10 3/mm (ref 0.2–0.9)
Monocyte %: 9.4 %
NEUTROS PCT: 79.8 %
Neutrophil #: 5.1 10*3/uL (ref 1.4–6.5)
Platelet: 178 10*3/uL (ref 150–440)
RBC: 3.9 10*6/uL (ref 3.80–5.20)
RDW: 14.4 % (ref 11.5–14.5)
WBC: 6.4 10*3/uL (ref 3.6–11.0)

## 2013-11-06 LAB — PRO B NATRIURETIC PEPTIDE: B-Type Natriuretic Peptide: 7885 pg/mL — ABNORMAL HIGH (ref 0–450)

## 2013-11-06 LAB — TROPONIN I

## 2013-11-08 ENCOUNTER — Telehealth: Payer: Self-pay | Admitting: Cardiology

## 2013-11-08 NOTE — Telephone Encounter (Signed)
Spoke to patient received message Dr.Jordan will not be in office 11/25/13 and your appointment needs to be rescheduled.Advised Dr.Jordan's schedule is full.Appointment rescheduled with Norma FredricksonLori Gerhardt NP [facility] [address] [facility] 11/10/13 at 11:45 am.

## 2013-11-10 ENCOUNTER — Encounter: Payer: Self-pay | Admitting: Nurse Practitioner

## 2013-11-10 ENCOUNTER — Other Ambulatory Visit: Payer: Self-pay | Admitting: *Deleted

## 2013-11-10 ENCOUNTER — Ambulatory Visit (INDEPENDENT_AMBULATORY_CARE_PROVIDER_SITE_OTHER): Payer: Medicare Other | Admitting: Nurse Practitioner

## 2013-11-10 VITALS — BP 120/60 | HR 77 | Ht 63.0 in | Wt 107.4 lb

## 2013-11-10 DIAGNOSIS — I10 Essential (primary) hypertension: Secondary | ICD-10-CM

## 2013-11-10 DIAGNOSIS — I482 Chronic atrial fibrillation, unspecified: Secondary | ICD-10-CM

## 2013-11-10 DIAGNOSIS — E785 Hyperlipidemia, unspecified: Secondary | ICD-10-CM

## 2013-11-10 DIAGNOSIS — R0600 Dyspnea, unspecified: Secondary | ICD-10-CM

## 2013-11-10 LAB — BASIC METABOLIC PANEL
BUN: 19 mg/dL (ref 6–23)
CO2: 21 mEq/L (ref 19–32)
Calcium: 8.7 mg/dL (ref 8.4–10.5)
Chloride: 103 mEq/L (ref 96–112)
Creatinine, Ser: 0.7 mg/dL (ref 0.4–1.2)
GFR: 81.13 mL/min (ref >60.00)
Glucose, Bld: 78 mg/dL (ref 70–99)
Potassium: 4.1 mEq/L (ref 3.5–5.1)
Sodium: 135 mEq/L (ref 135–145)

## 2013-11-10 LAB — BRAIN NATRIURETIC PEPTIDE: Pro B Natriuretic peptide (BNP): 418 pg/mL — ABNORMAL HIGH (ref 0.0–100.0)

## 2013-11-10 NOTE — Progress Notes (Signed)
Laurie Larsen Date of Birth: [birth year] Medical Record #[id number]  History of Present Illness: Laurie Larsen is seen back today for a follow up visit from the ER. Seen for Laurie Larsen. She has multiple issues which include HLD, pulmonary HTN, chronic atrial fib, chronic coumadin therapy, HTN, chronic diastolic dysfunction, CAD with known occlusion of a distal LCX branch from 2005, SSS with PPM in place (followed by Dr. Johney Larsen), chronic pain, HLD, and hypothyroidism. Did have pneumonia back in July of 2014 as well as right pleural effusion requiring thoracentesis - evaluation suggested a transudative process with some reactive mesothelial cells. Echo done in April showed normal LV and valvular function with moderate pulmonary HTN.   Last seen by me this past April.  Saw Laurie Represshris Berg, PA back at the end of August for a recent hospitalization due to multifactorial dyspnea.   Comes back today. Here alone. She tells me she was in the ER at [facility] this past Saturday. Was feeling weak. Tells me her BP was over 200. Given extra potassium. No details really - need a release signed. Remains short of breath. Uses her oxygen as needed. Weight is up a few pounds.Forgot her Lasix yesterday.  She is now feeling better for the most part but remains chronically short of breath.    Current Outpatient Prescriptions  Medication Sig Dispense Refill  . Acetaminophen (TYLENOL EX ST ARTHRITIS PAIN PO) Take 1 tablet by mouth as needed. For Pain.      [name]. atenolol (TENORMIN) 50 MG tablet Take 0.5 tablets (25 mg total) by mouth 2 (two) times daily.  90 tablet  6  . atorvastatin (LIPITOR) 40 MG tablet TAKE 1/2 TABLET BY MOUTH AT BEDTIME  45 tablet  0  . Calcium-Vitamin D 600-200 MG-UNIT per tablet Take 1 tablet by mouth 2 (two) times daily.       [name]. diltiazem (CARDIZEM CD) 180 MG 24 hr capsule TAKE 1 CAPSULE BY MOUTH DAILY  90 capsule  0  . dorzolamide-timolol (COSOPT) 22.3-6.8 MG/ML ophthalmic solution Place 1 drop into  both eyes 2 (two) times daily.       . furosemide (LASIX) 80 MG tablet Take 40-80 mg by mouth daily.      [name]. HYDROcodone-acetaminophen (NORCO/VICODIN) 5-325 MG per tablet Take 1 tablet by mouth as needed.      [name]. losartan (COZAAR) 100 MG tablet Take 50 mg by mouth at bedtime.      . Multiple Vitamins-Minerals (ICAPS AREDS FORMULA PO) Take 1 capsule by mouth 2 (two) times daily.       . NON FORMULARY Take 12,000 Units by mouth once a week.      . NON FORMULARY Place 2 L into the nose at bedtime. 2 liters of O2 nightly      . potassium chloride (K-DUR,KLOR-CON) 10 MEQ tablet Take 1 tablet (10 mEq total) by mouth 2 (two) times daily.  180 tablet  6  . sertraline (ZOLOFT) 25 MG tablet Take 25 mg by mouth daily.       [name]. SYNTHROID 125 MCG tablet TAKE 1 TABLET BY MOUTH DAILY  90 tablet  1  . traMADol (ULTRAM) 50 MG tablet Take 50 mg by mouth 2 (two) times daily. For pain.      [name]. travoprost, benzalkonium, (TRAVATAN) 0.004 % ophthalmic solution 1 drop as directed.        . warfarin (COUMADIN) 5 MG tablet Take as directed by coumadin clinic  40 tablet  3  . [DISCONTINUED]  potassium chloride (KLOR-CON 10) 10 MEQ tablet Take 1 tablet (10 mEq total) by mouth 2 (two) times daily.  60 tablet  5   No current facility-administered medications for this visit.    Allergies  Allergen Reactions  . Amoxicillin     Weakness  . Digoxin     REACTION: weakness, dehydration  . Risedronate Sodium     REACTION: rash    Past Medical History  Diagnosis Date  . Permanent atrial fibrillation     a. Chronic Coumadin.  [name]. HTN (hypertension)   . Chronic diastolic CHF (congestive heart failure), NYHA class 1     a. 04/2013 Echo: EF 50-55%, triv MR, mildly dil LA, PASP 48mmHg, mild TR, mild-mod RA dil.  [name]. CAD (coronary artery disease)     a. 2005 Cath: 100% dLCX-->Med managed.  . Sick sinus syndrome     a. 05/2008 s/p MDT EnRhythm DC PPM ([facility]).  . Chronic pain     a. ? due to arthritis  . Glaucoma   .  Hyperlipidemia   . Diverticulitis large intestine   . Allergy   . Phlebitis     a. after pacemeker placement in 2010.  [name]. Colon polyps   . Hypothyroidism   . Transfusion history     a. After childbirth 60 years ago (1957)  . Rash/skin eruption     a. Multiple episodes - wide distribution-intermittent, possibly drug rash.  . Pulmonary HTN     a. PASP 48mmHg on echo 04/2013.  [name]. Chronic Right Pleural Effusion     a. 07/2012 s/p thoracentesis - Followed by Dr. Delford Larsen [facility].    Past Surgical History  Procedure Laterality Date  . Appendectomy    . Abdominal hysterectomy    . Tonsillectomy    . Kidney surgery      Right  . Ankle fracture surgery      Left, pin  . Pacemaker insertion  06/20/08    MDT EnRhythm DR implanted by Dr Laurie Larsen  . Breast surgery      Needle guided excision, right breast calcification  . Breast fibroadenoma surgery  2010    Right  . Skin graft      left leg    History  Smoking status  . Never Smoker   Smokeless tobacco  . Never Used    History  Alcohol Use No    Family History  Problem Relation Age of Onset  . Cancer Mother     colon with mets  . Diabetes Mother   . Heart disease Father   . Hyperlipidemia Father   . Hypertension Father   . Stroke Brother     brother who died '11  . Cancer Paternal Aunt     breast  . Cancer Cousin     uterine, lung cancer    Review of Systems: The review of systems is per the HPI.  All other systems were reviewed and are negative.  Physical Exam: BP 120/60  Pulse 77  Ht 5\' 3"  (1.6 m)  Wt 107 lb 6.4 oz (48.716 kg)  BMI 19.03 kg/m2  SpO2 96% Patient is a very pleasant elderly female. She is in no acute distress. Weight is up a few pounds. Skin is warm and dry. Color is normal.  HEENT is unremarkable. Normocephalic/atraumatic. PERRL. Sclera are nonicteric. Neck is supple. No masses. No JVD. Lungs are clear. Cardiac exam shows an irregular rhythm. Her rate is ok. Abdomen is soft.  Extremities are with  trace pedal edema. Gait and ROM are intact. No gross neurologic deficits noted.  Wt Readings from Last 3 Encounters:  11/10/13 107 lb 6.4 oz (48.716 kg)  09/26/13 104 lb 8 oz (47.401 kg)  06/09/13 107 lb (48.535 kg)    LABORATORY DATA/PROCEDURES:  Lab Results  Component Value Date   WBC 7.0 04/20/2013   HGB 12.9 04/20/2013   HCT 38.8 04/20/2013   PLT 212.0 04/20/2013   GLUCOSE 108* 04/20/2013   CHOL 118 03/26/2011   TRIG 105.0 03/26/2011   HDL 51.10 03/26/2011   LDLCALC 46 03/26/2011   ALT 27 03/26/2011   AST 23 03/26/2011   NA 138 04/20/2013   K 3.5 04/20/2013   CL 104 04/20/2013   CREATININE 0.7 04/20/2013   BUN 16 04/20/2013   CO2 27 04/20/2013   TSH 1.52 03/23/2013   INR 1.7* 11/03/2013    BNP (last 3 results)  Recent Labs  05/04/13 1200  PROBNP 726.0*   Echo Study Conclusions from April of 2015 - Left ventricle: The cavity size was normal. There was mild concentric hypertrophy. Systolic function was normal. The estimated ejection fraction was in the range of 50% to 55%. Wall motion was normal; there were no regional wall motion abnormalities. The study was not technically sufficient to allow evaluation of LV diastolic dysfunction due to atrial fibrillation. - Aortic valve: No regurgitation. - Aortic root: The aortic root was normal in size. - Mitral valve: Calcified annulus. Mildly thickened leaflets . Trivial regurgitation. - Left atrium: The atrium was mildly dilated. - Right ventricle: Systolic function was mildly reduced. - Right atrium: The atrium was mildly to moderately dilated. Pacer wire or catheter noted in right atrium. - Tricuspid valve: Mild regurgitation. - Pulmonary arteries: Systolic pressure was mildly to moderately increased. PA peak pressure: 48mm Hg (S). - Pericardium, extracardiac: There was no pericardial effusion.    Assessment / Plan: 1. Recent ER visit - not really clear as to what transpired but I suspect she has some  diastolic HF/HTN - recheck BMET and BNP today. She forgot her Lasix yesterday - advised to restart but take 80 mg today. Recheck her labs today  2. Chronic atrial fib - rate controlled  3. HTN - BP ok today  4. Chronic shortness of breath - would use the oxygen prn. Her sat is good here today.   See back in about 2 months. Overall prognosis tenuous at best.   Patient is agreeable to this plan and will call if any problems develop in the interim.   Rosalio Macadamia, RN, ANP-C [facility] [address] [place], [place]  [address] [phone]

## 2013-11-10 NOTE — Patient Instructions (Addendum)
We will be checking the following labs today BMET & BNP  Stay on your current medicines but take 80 mg of Lasix today  Use your oxygen as needed  [facility] of [facility]  See Dr. [place] in 2 months  Call the [facility] office at [phone] if you have any questions, problems or concerns.

## 2013-11-16 LAB — PROTIME-INR: INR: 2.2 — AB (ref 0.9–1.1)

## 2013-11-17 ENCOUNTER — Ambulatory Visit (INDEPENDENT_AMBULATORY_CARE_PROVIDER_SITE_OTHER): Payer: Medicare Other | Admitting: Internal Medicine

## 2013-11-17 DIAGNOSIS — I482 Chronic atrial fibrillation, unspecified: Secondary | ICD-10-CM

## 2013-11-17 DIAGNOSIS — Z5181 Encounter for therapeutic drug level monitoring: Secondary | ICD-10-CM

## 2013-11-25 ENCOUNTER — Ambulatory Visit: Payer: BLUE CROSS/BLUE SHIELD | Admitting: Cardiology

## 2013-11-29 ENCOUNTER — Other Ambulatory Visit: Payer: Self-pay | Admitting: Cardiology

## 2013-12-01 DIAGNOSIS — H353190 Nonexudative age-related macular degeneration, unspecified eye, stage unspecified: Secondary | ICD-10-CM | POA: Insufficient documentation

## 2013-12-01 DIAGNOSIS — Z961 Presence of intraocular lens: Secondary | ICD-10-CM | POA: Insufficient documentation

## 2013-12-06 ENCOUNTER — Other Ambulatory Visit: Payer: Self-pay | Admitting: Cardiology

## 2013-12-08 ENCOUNTER — Telehealth: Payer: Self-pay

## 2013-12-08 LAB — PROTIME-INR: INR: 3.3 — AB (ref 0.9–1.1)

## 2013-12-08 NOTE — Telephone Encounter (Signed)
refill 

## 2013-12-09 ENCOUNTER — Ambulatory Visit (INDEPENDENT_AMBULATORY_CARE_PROVIDER_SITE_OTHER): Payer: Medicare Other | Admitting: Cardiology

## 2013-12-09 DIAGNOSIS — I482 Chronic atrial fibrillation, unspecified: Secondary | ICD-10-CM

## 2013-12-09 DIAGNOSIS — Z5181 Encounter for therapeutic drug level monitoring: Secondary | ICD-10-CM

## 2013-12-19 ENCOUNTER — Other Ambulatory Visit: Payer: Self-pay | Admitting: Cardiology

## 2013-12-21 ENCOUNTER — Ambulatory Visit (INDEPENDENT_AMBULATORY_CARE_PROVIDER_SITE_OTHER): Payer: Medicare Other | Admitting: Pharmacist

## 2013-12-21 DIAGNOSIS — I482 Chronic atrial fibrillation, unspecified: Secondary | ICD-10-CM

## 2013-12-21 DIAGNOSIS — Z5181 Encounter for therapeutic drug level monitoring: Secondary | ICD-10-CM

## 2013-12-21 LAB — PROTIME-INR: INR: 3.5 — AB (ref 0.9–1.1)

## 2013-12-29 ENCOUNTER — Other Ambulatory Visit: Payer: Self-pay

## 2013-12-29 MED ORDER — ATORVASTATIN CALCIUM 40 MG PO TABS: 20.0000 mg | ORAL_TABLET | Freq: Every day | ORAL | Status: DC

## 2014-01-05 ENCOUNTER — Ambulatory Visit (INDEPENDENT_AMBULATORY_CARE_PROVIDER_SITE_OTHER): Payer: Medicare Other | Admitting: *Deleted

## 2014-01-05 DIAGNOSIS — I482 Chronic atrial fibrillation, unspecified: Secondary | ICD-10-CM

## 2014-01-05 DIAGNOSIS — Z5181 Encounter for therapeutic drug level monitoring: Secondary | ICD-10-CM

## 2014-01-05 LAB — POCT INR: INR: 3.1

## 2014-01-17 LAB — PROTIME-INR: INR: 1.4 — AB (ref 0.9–1.1)

## 2014-01-18 ENCOUNTER — Ambulatory Visit (INDEPENDENT_AMBULATORY_CARE_PROVIDER_SITE_OTHER): Payer: Medicare Other | Admitting: Internal Medicine

## 2014-01-18 DIAGNOSIS — I482 Chronic atrial fibrillation, unspecified: Secondary | ICD-10-CM

## 2014-01-18 DIAGNOSIS — Z5181 Encounter for therapeutic drug level monitoring: Secondary | ICD-10-CM

## 2014-01-25 LAB — PROTIME-INR: INR: 1.8 — AB (ref 0.9–1.1)

## 2014-01-26 ENCOUNTER — Ambulatory Visit (INDEPENDENT_AMBULATORY_CARE_PROVIDER_SITE_OTHER): Payer: Medicare Other | Admitting: Pharmacist

## 2014-01-26 DIAGNOSIS — I482 Chronic atrial fibrillation, unspecified: Secondary | ICD-10-CM

## 2014-01-26 DIAGNOSIS — Z5181 Encounter for therapeutic drug level monitoring: Secondary | ICD-10-CM

## 2014-01-30 ENCOUNTER — Encounter: Payer: Self-pay | Admitting: Cardiology

## 2014-01-30 ENCOUNTER — Ambulatory Visit (INDEPENDENT_AMBULATORY_CARE_PROVIDER_SITE_OTHER): Payer: Medicare Other | Admitting: Cardiology

## 2014-01-30 VITALS — BP 136/81 | HR 102 | Ht 64.0 in | Wt 104.9 lb

## 2014-01-30 DIAGNOSIS — I482 Chronic atrial fibrillation, unspecified: Secondary | ICD-10-CM

## 2014-01-30 DIAGNOSIS — I5032 Chronic diastolic (congestive) heart failure: Secondary | ICD-10-CM

## 2014-01-30 DIAGNOSIS — R0602 Shortness of breath: Secondary | ICD-10-CM

## 2014-01-30 DIAGNOSIS — Z7901 Long term (current) use of anticoagulants: Secondary | ICD-10-CM

## 2014-01-30 DIAGNOSIS — I495 Sick sinus syndrome: Secondary | ICD-10-CM

## 2014-01-30 NOTE — Progress Notes (Signed)
Laurie Larsen Date of Birth: May 08, 1923 Medical Record #[id number]  History of Present Illness: Laurie Larsen is seen back today for follow up diastolic CHF. She has multiple issues which include HLD, pulmonary HTN, chronic atrial fib, chronic coumadin therapy, HTN, chronic diastolic dysfunction, CAD with known occlusion of a distal LCX branch from 2005, SSS with PPM in place (followed by Dr. Johney Frame), chronic pain, HLD, and hypothyroidism. Did have pneumonia back in July of 2014 as well as right pleural effusion requiring thoracentesis - evaluation suggested a transudative process with some reactive mesothelial cells. Echo done in April 2015 showed normal LV and valvular function with moderate pulmonary HTN.   She was admitted to [facility] hospital in August 2015 with exacerbation of diastolic CHF. This responded to diuresis. She is now on lasix 80 mg daily. She often skips her Sunday dose when she goes to church. Otherwise she has been compliant and reports weights have been stable. She was sent to the ED in October for elevated BP- but this resolved. She has been seen in the "Heart clinic at [place] by Ward Givens NP and Dr. Mariah Milling. She currently lives at [place].    Current Outpatient Prescriptions  Medication Sig Dispense Refill  . Acetaminophen (TYLENOL EX ST ARTHRITIS PAIN PO) Take 1 tablet by mouth as needed. For Pain.    [name] atenolol (TENORMIN) 50 MG tablet TAKE 1/2 TABLET BY MOUTH TWICE DAILY 90 tablet 1  . atorvastatin (LIPITOR) 40 MG tablet Take 0.5 tablets (20 mg total) by mouth at bedtime. 45 tablet 0  . Calcium-Vitamin D 600-200 MG-UNIT per tablet Take 1 tablet by mouth 2 (two) times daily.     [name] diltiazem (CARDIZEM CD) 180 MG 24 hr capsule TAKE 1 CAPSULE BY MOUTH DAILY 90 capsule 1  . dorzolamide-timolol (COSOPT) 22.3-6.8 MG/ML ophthalmic solution Place 1 drop into both eyes 2 (two) times daily.     . furosemide (LASIX) 80 MG tablet Take 40-80 mg by mouth daily.    [name]  HYDROcodone-acetaminophen (NORCO/VICODIN) 5-325 MG per tablet Take 1 tablet by mouth as needed.    [name] losartan (COZAAR) 100 MG tablet Take 50 mg by mouth at bedtime.    . Multiple Vitamins-Minerals (ICAPS AREDS FORMULA PO) Take 1 capsule by mouth 2 (two) times daily.     . NON FORMULARY Take 12,000 Units by mouth once a week.    . NON FORMULARY Place 2 L into the nose at bedtime. 2 liters of O2 nightly    . potassium chloride (K-DUR,KLOR-CON) 10 MEQ tablet Take 1 tablet (10 mEq total) by mouth 2 (two) times daily. 180 tablet 6  . SYNTHROID 125 MCG tablet TAKE 1 TABLET BY MOUTH DAILY 90 tablet 1  . travoprost, benzalkonium, (TRAVATAN) 0.004 % ophthalmic solution Place 1 drop into both eyes daily.     [name] warfarin (COUMADIN) 5 MG tablet TAKE AS DIRECTED BY COUMADIN CLINIC 40 tablet 3  . [DISCONTINUED] potassium chloride (KLOR-CON 10) 10 MEQ tablet Take 1 tablet (10 mEq total) by mouth 2 (two) times daily. 60 tablet 5   No current facility-administered medications for this visit.    Allergies  Allergen Reactions  . Amoxicillin     Weakness  . Digoxin     REACTION: weakness, dehydration  . Risedronate Sodium     REACTION: rash    Past Medical History  Diagnosis Date  . Permanent atrial fibrillation     a. Chronic Coumadin.  [name] HTN (hypertension)   . Chronic  diastolic CHF (congestive heart failure), NYHA class 1     a. 04/2013 Echo: EF 50-55%, triv MR, mildly dil LA, PASP , mild TR, mild-mod RA dil.  [name] CAD (coronary artery disease)     a. 2005 Cath: 100% dLCX-->Med managed.  . Sick sinus syndrome     a. 05/2008 s/p MDT EnRhythm DC PPM ([facility]).  . Chronic pain     a. ? due to arthritis  . Glaucoma   . Hyperlipidemia   . Diverticulitis large intestine   . Allergy   . Phlebitis     a. after pacemeker placement in 2010.  [name] Colon polyps   . Hypothyroidism   . Transfusion history     a. After childbirth 60 years ago (1957)  . Rash/skin eruption     a. Multiple episodes - wide  distribution-intermittent, possibly drug rash.  . Pulmonary HTN     a. PASP on echo 04/2013.  [name] Chronic Right Pleural Effusion     a. 07/2012 s/p thoracentesis - Followed by Dr. Delford Field [facility].    Past Surgical History  Procedure Laterality Date  . Appendectomy    . Abdominal hysterectomy    . Tonsillectomy    . Kidney surgery      Right  . Ankle fracture surgery      Left, pin  . Pacemaker insertion  06/20/08    MDT EnRhythm DR implanted by Dr Reyes Ivan  . Breast surgery      Needle guided excision, right breast calcification  . Breast fibroadenoma surgery  2010    Right  . Skin graft      left leg    History  Smoking status  . Never Smoker   Smokeless tobacco  . Never Used    History  Alcohol Use No    Family History  Problem Relation Age of Onset  . Cancer Mother     colon with mets  . Diabetes Mother   . Heart disease Father   . Hyperlipidemia Father   . Hypertension Father   . Stroke Brother     brother who died 2022/06/23  . Cancer Paternal Aunt     breast  . Cancer Cousin     uterine, lung cancer    Review of Systems: The review of systems is per the HPI.  All other systems were reviewed and are negative.  Physical Exam: BP 136/81 mmHg  Pulse 102  Ht 5\' 4"  (1.626 m)  Wt 104 lb 14.4 oz (47.582 kg)  BMI 18.00 kg/m2 Patient is a very pleasant elderly female. She is in no acute distress. Weight is  stable. Skin is warm and dry. Color is normal.  HEENT is unremarkable. Normocephalic/atraumatic. PERRL. Sclera are nonicteric. Neck is supple. No masses. No JVD. Lungs are clear. Cardiac exam shows an irregular rhythm. Her rate is ok. No gallop or murmur. Abdomen is soft. Extremities are without edema. Gait and ROM are intact. No gross neurologic deficits noted.  Wt Readings from Last 3 Encounters:  01/30/14 104 lb 14.4 oz (47.582 kg)  11/10/13 107 lb 6.4 oz (48.716 kg)  09/26/13 104 lb 8 oz (47.401 kg)    LABORATORY DATA/PROCEDURES:  Lab  Results  Component Value Date   WBC 7.0 04/20/2013   HGB 12.9 04/20/2013   HCT 38.8 04/20/2013   PLT 212.0 04/20/2013   GLUCOSE 78 11/10/2013   CHOL 118 03/26/2011   TRIG 105.0 03/26/2011   HDL 51.10 03/26/2011   LDLCALC  46 03/26/2011   ALT 27 03/26/2011   AST 23 03/26/2011   NA 135 11/10/2013   K 4.1 11/10/2013   CL 103 11/10/2013   CREATININE 0.7 11/10/2013   BUN 19 11/10/2013   CO2 21 11/10/2013   TSH 1.52 03/23/2013   INR 1.8* 01/25/2014    BNP (last 3 results)  Recent Labs  05/04/13 1200 11/10/13 1228  PROBNP 726.0* 418.0*   Echo Study Conclusions from April of 2015 - Left ventricle: The cavity size was normal. There was mild concentric hypertrophy. Systolic function was normal. The estimated ejection fraction was in the range of 50% to 55%. Wall motion was normal; there were no regional wall motion abnormalities. The study was not technically sufficient to allow evaluation of LV diastolic dysfunction due to atrial fibrillation. - Aortic valve: No regurgitation. - Aortic root: The aortic root was normal in size. - Mitral valve: Calcified annulus. Mildly thickened leaflets . Trivial regurgitation. - Left atrium: The atrium was mildly dilated. - Right ventricle: Systolic function was mildly reduced. - Right atrium: The atrium was mildly to moderately dilated. Pacer wire or catheter noted in right atrium. - Tricuspid valve: Mild regurgitation. - Pulmonary arteries: Systolic pressure was mildly to moderately increased. PA peak pressure: 48mm Hg (S). - Pericardium, extracardiac: There was no pericardial effusion.  Ecg: today: Afib with rate 100 bpm. Old anterior infarct. T wave inversion consistent with inferior ischemia. No change from old Ecg. I have personally reviewed and interpreted this study.  Assessment / Plan: 1. Chronic diastolic CHF with right pleural effusion. She is well compensated today with stable weight. Stressed importance of taking lasix  daily. Take extra half lasix for weight gain or swelling. Sodium restriction. Follow up in 3 months.  2. Chronic atrial fib - rate controlled. Anticoagulated with coumadin.  3. HTN - BP ok today  4. Chronic dyspnea -  uses the oxygen prn.

## 2014-01-30 NOTE — Patient Instructions (Signed)
Continue your current therapy  If your weight increases take an extra half of lasix  I will see you in 3 months.

## 2014-02-01 LAB — POCT INR: INR: 2.3

## 2014-02-02 ENCOUNTER — Ambulatory Visit (INDEPENDENT_AMBULATORY_CARE_PROVIDER_SITE_OTHER): Payer: Medicare Other | Admitting: *Deleted

## 2014-02-02 DIAGNOSIS — I482 Chronic atrial fibrillation, unspecified: Secondary | ICD-10-CM

## 2014-02-02 DIAGNOSIS — Z5181 Encounter for therapeutic drug level monitoring: Secondary | ICD-10-CM

## 2014-02-15 LAB — PROTIME-INR: INR: 2.8 — AB (ref 0.9–1.1)

## 2014-02-16 ENCOUNTER — Ambulatory Visit (INDEPENDENT_AMBULATORY_CARE_PROVIDER_SITE_OTHER): Payer: Medicare Other | Admitting: Internal Medicine

## 2014-02-16 DIAGNOSIS — I482 Chronic atrial fibrillation, unspecified: Secondary | ICD-10-CM

## 2014-02-16 DIAGNOSIS — Z5181 Encounter for therapeutic drug level monitoring: Secondary | ICD-10-CM

## 2014-03-09 ENCOUNTER — Ambulatory Visit (INDEPENDENT_AMBULATORY_CARE_PROVIDER_SITE_OTHER): Payer: Medicare Other | Admitting: Internal Medicine

## 2014-03-09 ENCOUNTER — Ambulatory Visit (INDEPENDENT_AMBULATORY_CARE_PROVIDER_SITE_OTHER)
Admission: RE | Admit: 2014-03-09 | Discharge: 2014-03-09 | Disposition: A | Discharge status: Home / Self Care | Payer: Medicare Other | Source: Ambulatory Visit | Attending: Internal Medicine | Admitting: Internal Medicine

## 2014-03-09 ENCOUNTER — Encounter: Payer: Self-pay | Admitting: Internal Medicine

## 2014-03-09 VITALS — BP 124/66 | HR 72 | Ht 64.0 in | Wt 103.0 lb

## 2014-03-09 DIAGNOSIS — R0781 Pleurodynia: Secondary | ICD-10-CM

## 2014-03-09 DIAGNOSIS — I5032 Chronic diastolic (congestive) heart failure: Secondary | ICD-10-CM

## 2014-03-09 DIAGNOSIS — R0602 Shortness of breath: Secondary | ICD-10-CM

## 2014-03-09 DIAGNOSIS — J90 Pleural effusion, not elsewhere classified: Secondary | ICD-10-CM

## 2014-03-09 NOTE — Assessment & Plan Note (Signed)
Not better. Might be worse allowing for technique.

## 2014-03-09 NOTE — Assessment & Plan Note (Signed)
Findings most consistent with worse CHF and fluid overload. Can't entirely exclude infection, but focus first on improved diuresis for today. We were able to get appointment for [facility] to be [facility] tomorrow [facility]. She was anxious to get home to [place] before dark because husband driving tonight. Plan- complete total of 120 mg lasix today. Appointment tomorrow with Cardiology [address]- None available in [place] before weekend.

## 2014-03-09 NOTE — Patient Instructions (Signed)
Try taking a total of 120 mg of lasix today and tomorrow       That would be 80 mg more today, then 40 mg x 3 tomorrow  Call for work in visit tomorrow if possible, either at Dr Marylou FlesherGolan's office or your heart clinic. I think your problem is with your heart failure and increasing fluid in your chest. If it doesn't improve enough with the extra lasix, then it may need to be drawn off.

## 2014-03-09 NOTE — Progress Notes (Signed)
Subjective:    Patient ID: Laurie Larsen, female    DOB: 02-24-1923, 81 y.o.   MRN: [id number]  HPI   06/03/2013 Chief Complaint  Patient presents with  . 6 month follow up    Has SOB spells "about every day" at rest.  Feels this has worsened over the past wk.  No cough.   Notes more dyspnea for one month.  Dry cough, no mucus. Pain in ribs worse after eating.  Does not choke on food. Notes difficultly laying flat with dyspnea  03/09/14- Acute Visit Dr. Delford [facility]Wright walk-in pt presents with increased sob X2 days, worse when first waking up.  also c/o sharp chest pain.  had cxr today.   Hx pulmonary hypertension, AFib/ anticoagulation, diastolic CHF, perm pacemaker Saw cardiology 01/30/14 Increased SOB x 2-3 days, especially supine.Needed to prop up last night, using her O2 2L/ sleep. No fever, sore throat or discolored sputum. Increased pedal edema "bad this week". Familiar intermittent sharp left lateral rib pain. Not new. CXR 03/09/14- Patient is rotated and posture is poor. There is RML density which could be pneumonia, but is more likely fluid in fissure. IMPRESSION: Chronic right pleural effusion and bibasilar scarring. No acute abnormality is seen. Electronically Signed  By: Alcide CleverMark Lukens M.D.  On: 03/09/2014 16:15   Review of Systems  Constitutional: Positive for appetite change and unexpected weight change. Negative for fever, chills, diaphoresis, activity change and fatigue.  HENT: Positive for congestion, postnasal drip, sinus pressure and sneezing. Negative for dental problem, ear discharge, ear pain, facial swelling, hearing loss, mouth sores, nosebleeds, rhinorrhea, sore throat, tinnitus, trouble swallowing and voice change.   Eyes: Negative for photophobia, discharge, itching and visual disturbance.  Respiratory: Positive for cough, chest tightness and shortness of breath. Negative for apnea, choking, wheezing and stridor.   Cardiovascular: Positive for palpitations.  Negative for chest pain and leg swelling.  Gastrointestinal: Positive for abdominal pain. Negative for nausea, vomiting, constipation, blood in stool and abdominal distention.  Genitourinary: Negative for dysuria, urgency, frequency, hematuria, flank pain, decreased urine volume and difficulty urinating.  Musculoskeletal: Negative for myalgias, back pain, joint swelling, arthralgias, gait problem, neck pain and neck stiffness.  Skin: Negative for color change, pallor and rash.  Neurological: Negative for dizziness, tremors, seizures, syncope, speech difficulty, weakness, light-headedness, numbness and headaches.  Hematological: Negative for adenopathy. Does not bruise/bleed easily.  Psychiatric/Behavioral: Positive for dysphoric mood. Negative for confusion, sleep disturbance and agitation. The patient is nervous/anxious.        Objective:   Physical Exam Filed Vitals:   03/09/14 1532  BP: 124/66  Pulse: 72  Height: 5\' 4"  (1.626 m)  Weight: 103 lb (46.72 kg)  SpO2: 92%    OBJ- Physical Exam  Frail elderly woman General- Alert, Oriented, Affect-appropriate, Distress- breathless Skin- rash-none, lesions- none, excoriation- none Lymphadenopathy- none Head- atraumatic            Eyes- Gross vision intact, PERRLA, conjunctivae and secretions clear            Ears- Hearing, canals-normal            Nose- Clear, no-Septal dev, mucus, polyps, erosion, perforation             Throat- Mallampati II , mucosa clear , drainage- none, tonsils- atrophic Neck- flexible , trachea midline, no stridor , thyroid nl, carotid no bruit Chest - symmetrical excursion , unlabored           Heart/CV- RRR , no murmur ,  no gallop  , no rub, nl s1 s2                           - JVD- none , edema+2 feet, stasis changes- none, varices- none           Lung- +crackles/ rales both bases, wheeze- none, cough- none , dullness+right base, rub- none           Chest wall- +L pacemaker Abd-  Br/ Gen/ Rectal- Not done,  not indicated Extrem- cyanosis- none, clubbing, none, atrophy- none, strength- nl Neuro- grossly intact to observation    No results found.  All imaging reviewed Pending radiology report - probable RLL pneumonia w effusion      Assessment & Plan:   No problem-specific assessment & plan notes found for this encounter.   Updated Medication List Outpatient Encounter Prescriptions as of 03/09/2014  Medication Sig  . Acetaminophen (TYLENOL EX ST ARTHRITIS PAIN PO) Take 1 tablet by mouth as needed. For Pain.  [name] atenolol (TENORMIN) 50 MG tablet TAKE 1/2 TABLET BY MOUTH TWICE DAILY  . atorvastatin (LIPITOR) 40 MG tablet Take 0.5 tablets (20 mg total) by mouth at bedtime.  . Calcium-Vitamin D 600-200 MG-UNIT per tablet Take 1 tablet by mouth 2 (two) times daily.   [name] diltiazem (CARDIZEM CD) 180 MG 24 hr capsule TAKE 1 CAPSULE BY MOUTH DAILY  . dorzolamide-timolol (COSOPT) 22.3-6.8 MG/ML ophthalmic solution Place 1 drop into both eyes 2 (two) times daily.   . furosemide (LASIX) 80 MG tablet Take 40-80 mg by mouth daily.  [name] HYDROcodone-acetaminophen (NORCO/VICODIN) 5-325 MG per tablet Take 1 tablet by mouth as needed.  [name] losartan (COZAAR) 100 MG tablet Take 50 mg by mouth at bedtime.  . Multiple Vitamins-Minerals (ICAPS AREDS FORMULA PO) Take 1 capsule by mouth 2 (two) times daily.   . NON FORMULARY Take 12,000 Units by mouth once a week.  . NON FORMULARY Place 2.5 L into the nose at bedtime. 2 liters of O2 nightly  . potassium chloride (K-DUR,KLOR-CON) 10 MEQ tablet Take 1 tablet (10 mEq total) by mouth 2 (two) times daily.  [name] SYNTHROID 125 MCG tablet TAKE 1 TABLET BY MOUTH DAILY  . travoprost, benzalkonium, (TRAVATAN) 0.004 % ophthalmic solution Place 1 drop into both eyes daily.   [name] warfarin (COUMADIN) 5 MG tablet TAKE AS DIRECTED BY COUMADIN CLINIC

## 2014-03-10 ENCOUNTER — Ambulatory Visit (INDEPENDENT_AMBULATORY_CARE_PROVIDER_SITE_OTHER): Payer: Medicare Other | Admitting: Physician Assistant

## 2014-03-10 ENCOUNTER — Telehealth: Payer: Self-pay | Admitting: *Deleted

## 2014-03-10 ENCOUNTER — Ambulatory Visit: Payer: Medicare Other | Admitting: Internal Medicine

## 2014-03-10 ENCOUNTER — Encounter: Payer: Self-pay | Admitting: Physician Assistant

## 2014-03-10 VITALS — BP 120/70 | HR 75 | Ht 64.0 in | Wt 103.0 lb

## 2014-03-10 DIAGNOSIS — Z950 Presence of cardiac pacemaker: Secondary | ICD-10-CM

## 2014-03-10 DIAGNOSIS — I272 Pulmonary hypertension, unspecified: Secondary | ICD-10-CM

## 2014-03-10 DIAGNOSIS — I5032 Chronic diastolic (congestive) heart failure: Secondary | ICD-10-CM

## 2014-03-10 DIAGNOSIS — I270 Primary pulmonary hypertension: Secondary | ICD-10-CM

## 2014-03-10 DIAGNOSIS — I5033 Acute on chronic diastolic (congestive) heart failure: Secondary | ICD-10-CM

## 2014-03-10 DIAGNOSIS — I495 Sick sinus syndrome: Secondary | ICD-10-CM

## 2014-03-10 DIAGNOSIS — I482 Chronic atrial fibrillation, unspecified: Secondary | ICD-10-CM

## 2014-03-10 DIAGNOSIS — E785 Hyperlipidemia, unspecified: Secondary | ICD-10-CM

## 2014-03-10 DIAGNOSIS — I10 Essential (primary) hypertension: Secondary | ICD-10-CM

## 2014-03-10 DIAGNOSIS — I2510 Atherosclerotic heart disease of native coronary artery without angina pectoris: Secondary | ICD-10-CM

## 2014-03-10 DIAGNOSIS — R0602 Shortness of breath: Secondary | ICD-10-CM

## 2014-03-10 LAB — BASIC METABOLIC PANEL
BUN: 24 mg/dL — ABNORMAL HIGH (ref 6–23)
CALCIUM: 8.8 mg/dL (ref 8.4–10.5)
CO2: 32 mEq/L (ref 19–32)
CREATININE: 0.81 mg/dL (ref 0.40–1.20)
Chloride: 103 mEq/L (ref 96–112)
GFR: 70.76 mL/min (ref >60.00)
Glucose, Bld: 86 mg/dL (ref 70–99)
Potassium: 3.8 mEq/L (ref 3.5–5.1)
Sodium: 138 mEq/L (ref 135–145)

## 2014-03-10 LAB — BRAIN NATRIURETIC PEPTIDE: PRO B NATRI PEPTIDE: 925 pg/mL — AB (ref 0.0–100.0)

## 2014-03-10 NOTE — Patient Instructions (Signed)
I agree with you taking extra Lasix as directed by Dr. Maple HudsonYoung yesterday. Do the following: Lasix 80 mg in the morning and 40 mg in the afternoon today and tomorrow. On Sunday, start Lasix 40 mg twice a day. Labs today:  BMET, BNP today. Labs in 1 week:  BMET. Schedule follow up with Tereso NewcomerScott Toshiko Kemler, PA-C or Dr. Peter SwazilandJordan in 1-2 weeks. Please call if your breathing is not improved over the next 3 days.

## 2014-03-10 NOTE — Progress Notes (Signed)
Cardiology Office Note   Date:  03/10/2014   ID:  [name], DOB 02-16-1923, MRN [id number]  PCP:  [name], MD  Cardiologist:  Dr. Peter Swaziland   Electrophysiologist:  Dr. Hillis Range    Chief Complaint  Patient presents with  . Shortness of Breath  . Congestive Heart Failure     History of Present Illness: Laurie Larsen is a 81 y.o. female with a hx of diastolic HF and chronic R pleural effusion, HLD, pulmonary HTN, chronic atrial fib, chronic coumadin therapy, HTN, CAD with known occlusion of a distal LCX branch from 2005, SSS with PPM in place (followed by Dr. Johney Frame), chronic pain, and hypothyroidism. Did have pneumonia back in July of 2014 as well as right pleural effusion requiring thoracentesis - evaluation suggested a transudative process with some reactive mesothelial cells. Echo done in April 2015 showed normal LV and valvular function with moderate pulmonary HTN.   Admitted to [facility] in August 2015 with exacerbation of diastolic HF.  She currently lives at [facility].  Last seen by Dr. Peter Swaziland 01/30/14.   Patient saw Dr. Maple Hudson yesterday at pulmonology office with increased dyspnea. Chest x-ray demonstrated chronic right pleural effusion and no acute abnormality. She was felt to have findings consistent with worsening volume overload. She was given extra Lasix and set up for an appointment today.  She tells me that she has noted several nights of increased dyspnea, orthopnea, PND.  She noted 4 lb weight gain and increased ankle edema last week.  She denies cough or wheezing.  She has noted chest heaviness with her dyspnea.  This is not like previous angina.  She feels her symptoms are c/w prior CHF.  She denies syncope.     Studies/Reports Reviewed Today:  Cardiac Cath 12/2003 RESULTS: LAD: Proximal to mid 20%  D1: ostial 20%  LCX:  100% distal   EF: 65% with inferior apical hypokinesis, LVEDP 12 mmHg.  2D Echocardiogram 04/2013 - Mild LVH EF  50% to 55%. Wall motion was normal - Mitral valve: Calcified annulus.  Trivial regurgitation. - Mild LAE - Mildly reduced RVSF. - Mildly to moderate RAE.  Pacer wire or catheter noted in right atrium. - Mild TR - PASP 48mm Hg    Past Medical History  Diagnosis Date  . Permanent atrial fibrillation     a. Chronic Coumadin.  [name] HTN (hypertension)   . Chronic diastolic CHF (congestive heart failure), NYHA class 1     a. 04/2013 Echo: EF 50-55%, triv MR, mildly dil LA, PASP , mild TR, mild-mod RA dil.  [name] CAD (coronary artery disease)     a. 2005 Cath: 100% dLCX-->Med managed.  . Sick sinus syndrome     a. 05/2008 s/p MDT EnRhythm DC PPM ([facility]).  . Chronic pain     a. ? due to arthritis  . Glaucoma   . Hyperlipidemia   . Diverticulitis large intestine   . Allergy   . Phlebitis     a. after pacemeker placement in 2010.  [name] Colon polyps   . Hypothyroidism   . Transfusion history     a. After childbirth 60 years ago (1957)  . Rash/skin eruption     a. Multiple episodes - wide distribution-intermittent, possibly drug rash.  . Pulmonary HTN     a. PASP on echo 04/2013.  [name] Chronic Right Pleural Effusion     a. 07/2012 s/p thoracentesis - Followed by Dr. Delford Field [facility].  Past Surgical History  Procedure Laterality Date  . Appendectomy    . Abdominal hysterectomy    . Tonsillectomy    . Kidney surgery      Right  . Ankle fracture surgery      Left, pin  . Pacemaker insertion  06/20/08    MDT EnRhythm DR implanted by Dr Reyes Ivan  . Breast surgery      Needle guided excision, right breast calcification  . Breast fibroadenoma surgery  2010    Right  . Skin graft      left leg     Current Outpatient Prescriptions  Medication Sig Dispense Refill  . Acetaminophen (TYLENOL EX ST ARTHRITIS PAIN PO) Take 1 tablet by mouth as needed. For Pain.    [name] atenolol (TENORMIN) 50 MG tablet TAKE 1/2 TABLET BY MOUTH TWICE DAILY 90 tablet 1  . atorvastatin  (LIPITOR) 40 MG tablet Take 0.5 tablets (20 mg total) by mouth at bedtime. 45 tablet 0  . Calcium-Vitamin D 600-200 MG-UNIT per tablet Take 1 tablet by mouth 2 (two) times daily.     [name] diltiazem (CARDIZEM CD) 180 MG 24 hr capsule TAKE 1 CAPSULE BY MOUTH DAILY 90 capsule 1  . dorzolamide-timolol (COSOPT) 22.3-6.8 MG/ML ophthalmic solution Place 1 drop into both eyes 2 (two) times daily.     . furosemide (LASIX) 80 MG tablet Take 40-80 mg by mouth daily.    [name] HYDROcodone-acetaminophen (NORCO/VICODIN) 5-325 MG per tablet Take 1 tablet by mouth as needed.    [name] losartan (COZAAR) 100 MG tablet Take 50 mg by mouth at bedtime.    . Multiple Vitamins-Minerals (ICAPS AREDS FORMULA PO) Take 1 capsule by mouth 2 (two) times daily.     . NON FORMULARY Take 12,000 Units by mouth once a week.    . NON FORMULARY Place 2.5 L into the nose at bedtime. 2 liters of O2 nightly    . potassium chloride (K-DUR,KLOR-CON) 10 MEQ tablet Take 1 tablet (10 mEq total) by mouth 2 (two) times daily. 180 tablet 6  . SYNTHROID 125 MCG tablet TAKE 1 TABLET BY MOUTH DAILY 90 tablet 1  . travoprost, benzalkonium, (TRAVATAN) 0.004 % ophthalmic solution Place 1 drop into both eyes daily.     . Vitamin D, Ergocalciferol, (DRISDOL) 50000 UNITS CAPS capsule Take 50,000 Units by mouth once a week.   5  . warfarin (COUMADIN) 5 MG tablet TAKE AS DIRECTED BY COUMADIN CLINIC 40 tablet 3  . [DISCONTINUED] potassium chloride (KLOR-CON 10) 10 MEQ tablet Take 1 tablet (10 mEq total) by mouth 2 (two) times daily. 60 tablet 5   No current facility-administered medications for this visit.    Allergies:   Amoxicillin; Digoxin; and Risedronate sodium    Social History:  The patient  reports that she has never smoked. She has never used smokeless tobacco. She reports that she does not drink alcohol or use illicit drugs.   Family History:  The patient's family history includes Cancer in her cousin, mother, and paternal aunt; Diabetes in her mother;  Heart attack in her brother and father; Heart disease in her father; Hyperlipidemia in her father; Hypertension in her father; Stroke in her brother.    ROS:  Please see the history of present illness.   Otherwise, review of systems are positive for none.   All other systems are reviewed and negative.    PHYSICAL EXAM: VS:  BP 120/70 mmHg  Pulse 75  Ht  (1.626 m)  Wt  103 lb (46.72 kg)  BMI 17.67 kg/m2    Wt Readings from Last 3 Encounters:  03/10/14 103 lb (46.72 kg)  03/09/14 103 lb (46.72 kg)  01/30/14 104 lb 14.4 oz (47.582 kg)     GEN: Well nourished, well developed, in no acute distress HEENT: normal Neck: + JVD at 90 degrees, no masses Cardiac:  Normal S1/S2, irregularly irregular rhythm; no murmur, no rubs or gallops, 1+ bilateral ankle edema  Respiratory:  Decreased breath sounds at the bases bilaterally, + faint bibasilar rales. GI: soft, nontender, nondistended, + BS MS: no deformity or atrophy Skin: warm and dry  Neuro:  CNs II-XII intact, Strength and sensation are intact Psych: Normal affect   EKG:  EKG is ordered today.  It demonstrates:   Underlying AFib, V paced, HR 75, no change from prior tracings   Recent Labs: 03/23/2013: TSH 1.52 04/20/2013: Hemoglobin 12.9; Platelets 212.0 11/10/2013: BUN 19; Creatinine 0.7; Potassium 4.1; Pro B Natriuretic peptide (BNP) 418.0*; Sodium 135    Lipid Panel    Component Value Date/Time   CHOL 118 03/26/2011 0941   TRIG 105.0 03/26/2011 0941   HDL 51.10 03/26/2011 0941   CHOLHDL 2 03/26/2011 0941   VLDL 21.0 03/26/2011 0941   LDLCALC 46 03/26/2011 0941      ASSESSMENT AND PLAN:  1.  Acute on chronic diastolic CHF (congestive heart failure):  She appears to be volume overloaded.  CXR done by Dr. Maple HudsonYoung yesterday was felt to show increase in the R effusion and increased CHF markings.  I agree with increasing her Lasix for a couple of days.  She already feels better today and was able to sleep through the night  last night.      -  Take Lasix 80 A/ 40 P x 2 days >> Lasix 40 bid.    -  BMET, BNP today    -  BMET 1 week.    -  Call if symptoms no better.  2.  Chronic atrial fibrillation:  Rate controlled.  Continue coumadin which she is tolerating. 3.  Pulmonary HTN:  FU with pulmonary as planned.  4.  Essential hypertension, benign:  BP controlled on current regimen which includes Atenolol, Diltiazem, Losartan. 5.  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  6.  Coronary Artery Disease:  She has had some chest pressure with her increased dyspnea.  This all appears to be related to acute HF.  No further investigation needed at this time.  She is not on ASA as she is on Coumadin.  Continue Lipitor 20, beta blocker (atenolol). 7.  HLD (hyperlipidemia):  Continue Lipitor.      Current medicines are reviewed at length with the patient today.  The patient does not have concerns regarding medicines.  The following changes have been made:  As above.    Labs/ tests ordered today include:  Orders Placed This Encounter  Procedures  . Basic metabolic panel  . Brain natriuretic peptide  . Basic metabolic panel  . EKG 12-Lead    Disposition:   FU with me in 1 week (of note, she requested to FU with Norma FredricksonLori Gerhardt, NP).    Signed, Brynda RimScott Vegas Coffin, PA-C, MHS 03/10/2014 9:12 AM    [facility] [address], [place], [place]  [address] Phone: [phone]; Fax: [phone]

## 2014-03-10 NOTE — Telephone Encounter (Signed)
I lmptcb x 2 on the house # then left detailed message on pt's cell w/lasix change and K+ change. I did state since I did not reach her tonight if they have questions to call the on call who can give her these directions as well.

## 2014-03-13 ENCOUNTER — Other Ambulatory Visit: Payer: Self-pay | Admitting: Nurse Practitioner

## 2014-03-13 MED ORDER — ATORVASTATIN CALCIUM 40 MG PO TABS: 20.0000 mg | ORAL_TABLET | Freq: Every day | ORAL | Status: DC

## 2014-03-13 NOTE — Telephone Encounter (Signed)
lmptcb x 3 to go over lab results and med changes.

## 2014-03-15 ENCOUNTER — Telehealth: Payer: Self-pay | Admitting: Internal Medicine

## 2014-03-15 NOTE — Telephone Encounter (Signed)
[phone], pt cb

## 2014-03-15 NOTE — Telephone Encounter (Signed)
Called and spoke with pt and she is aware of cxr results.  She forgot what was told to her.  Nothing further is needed.

## 2014-03-15 NOTE — Telephone Encounter (Signed)
lmomtcb x1 

## 2014-03-16 ENCOUNTER — Other Ambulatory Visit (INDEPENDENT_AMBULATORY_CARE_PROVIDER_SITE_OTHER): Payer: Medicare Other | Admitting: *Deleted

## 2014-03-16 DIAGNOSIS — I5033 Acute on chronic diastolic (congestive) heart failure: Secondary | ICD-10-CM

## 2014-03-16 LAB — BASIC METABOLIC PANEL
BUN: 26 mg/dL — AB (ref 6–23)
CO2: 26 mEq/L (ref 19–32)
Calcium: 8.5 mg/dL (ref 8.4–10.5)
Chloride: 104 mEq/L (ref 96–112)
Creatinine, Ser: 0.77 mg/dL (ref 0.40–1.20)
GFR: 75.02 mL/min (ref >60.00)
GLUCOSE: 91 mg/dL (ref 70–99)
POTASSIUM: 4.1 meq/L (ref 3.5–5.1)
Sodium: 135 mEq/L (ref 135–145)

## 2014-03-16 LAB — PROTIME-INR: INR: 1.9 — AB (ref 0.9–1.1)

## 2014-03-17 ENCOUNTER — Ambulatory Visit (INDEPENDENT_AMBULATORY_CARE_PROVIDER_SITE_OTHER): Payer: Medicare Other | Admitting: Cardiology

## 2014-03-17 DIAGNOSIS — I482 Chronic atrial fibrillation, unspecified: Secondary | ICD-10-CM

## 2014-03-17 DIAGNOSIS — Z5181 Encounter for therapeutic drug level monitoring: Secondary | ICD-10-CM

## 2014-03-20 ENCOUNTER — Telehealth: Payer: Self-pay | Admitting: *Deleted

## 2014-03-20 NOTE — Telephone Encounter (Signed)
husband notified of lab results, states he will let pt know.

## 2014-03-21 ENCOUNTER — Encounter: Payer: Self-pay | Admitting: Nurse Practitioner

## 2014-03-21 ENCOUNTER — Ambulatory Visit (INDEPENDENT_AMBULATORY_CARE_PROVIDER_SITE_OTHER): Payer: Medicare Other | Admitting: Nurse Practitioner

## 2014-03-21 VITALS — BP 130/90 | HR 71 | Wt 109.8 lb

## 2014-03-21 DIAGNOSIS — I2510 Atherosclerotic heart disease of native coronary artery without angina pectoris: Secondary | ICD-10-CM

## 2014-03-21 DIAGNOSIS — E785 Hyperlipidemia, unspecified: Secondary | ICD-10-CM

## 2014-03-21 DIAGNOSIS — I482 Chronic atrial fibrillation, unspecified: Secondary | ICD-10-CM

## 2014-03-21 DIAGNOSIS — R0600 Dyspnea, unspecified: Secondary | ICD-10-CM

## 2014-03-21 DIAGNOSIS — R0602 Shortness of breath: Secondary | ICD-10-CM

## 2014-03-21 DIAGNOSIS — I5032 Chronic diastolic (congestive) heart failure: Secondary | ICD-10-CM | POA: Diagnosis not present

## 2014-03-21 DIAGNOSIS — Z950 Presence of cardiac pacemaker: Secondary | ICD-10-CM | POA: Diagnosis not present

## 2014-03-21 LAB — BASIC METABOLIC PANEL
BUN: 27 mg/dL — ABNORMAL HIGH (ref 6–23)
CO2: 31 mEq/L (ref 19–32)
Calcium: 8.6 mg/dL (ref 8.4–10.5)
Chloride: 102 mEq/L (ref 96–112)
Creatinine, Ser: 0.93 mg/dL (ref 0.40–1.20)
GFR: 60.33 mL/min (ref >60.00)
Glucose, Bld: 134 mg/dL — ABNORMAL HIGH (ref 70–99)
Potassium: 3.6 mEq/L (ref 3.5–5.1)
Sodium: 139 mEq/L (ref 135–145)

## 2014-03-21 LAB — BRAIN NATRIURETIC PEPTIDE: Pro B Natriuretic peptide (BNP): 844 pg/mL — ABNORMAL HIGH (ref 0.0–100.0)

## 2014-03-21 MED ORDER — FUROSEMIDE 80 MG PO TABS: 80.0000 mg | ORAL_TABLET | Freq: Two times a day (BID) | ORAL | Status: DC

## 2014-03-21 NOTE — Progress Notes (Signed)
CARDIOLOGY OFFICE NOTE  Date:  03/21/2014    Laurie Larsen Date of Birth: 07/26/1923 Medical Record #[id number]  PCP:  [name], MD  Cardiologist:  [place]az[place]    Chief Complaint  Patient presents with  . Shortness of Breath    Follow up visit - seen for Dr. Swaziland.      History of Present Illness: Laurie Larsen is a 81 y.o. female who presents today for a 2 week check. She is seen for Dr. Swaziland. She has a hx of diastolic HF and chronic R pleural effusion, HLD, pulmonary HTN, chronic atrial fib, chronic coumadin therapy, HTN, CAD with known occlusion of a distal LCX branch from 2005, SSS with PPM in place (followed by Dr. Johney Frame), chronic pain, and hypothyroidism. Did have pneumonia back in July of 2014 as well as right pleural effusion requiring thoracentesis - evaluation suggested a transudative process with some reactive mesothelial cells. Echo done in April 2015 showed normal LV and valvular function with moderate pulmonary HTN.   Admitted to [facility] in August 2015 with exacerbation of diastolic HF. She currently lives at [facility]. Last seen by Dr. Peter Swaziland 01/30/14.   Seen here 2 weeks ago by Tereso Newcomer, PA with increased dyspnea. Had just seen Dr. Maple Hudson as well. Felt to be more related to volume overload/CHF and treated for such.  Comes back today. Here alone. She says her breathing is some better. Weight however is up. Says that "cheese" is the culprit. She ate out this past weekend as well - went to [employer] No lasix yet today since she was coming here. She does think she is better than when she was here earlier in the month. No falls. Tolerating her coumadin. No bleeding. On higher dose of pain medicine for her chronic back pain. On oxygen at night. She says her chest feels better. She has been using 40 to 80 mg of lasix BID.   Past Medical History  Diagnosis Date  . Permanent atrial fibrillation     a. Chronic Coumadin.  [name] HTN (hypertension)   .  Chronic diastolic CHF (congestive heart failure), NYHA class 1     a. 04/2013 Echo: EF 50-55%, triv MR, mildly dil LA, PASP , mild TR, mild-mod RA dil.  [name] CAD (coronary artery disease)     a. 2005 Cath: 100% dLCX-->Med managed.  . Sick sinus syndrome     a. 05/2008 s/p MDT EnRhythm DC PPM ([facility]).  . Chronic pain     a. ? due to arthritis  . Glaucoma   . Hyperlipidemia   . Diverticulitis large intestine   . Allergy   . Phlebitis     a. after pacemeker placement in 2010.  [name] Colon polyps   . Hypothyroidism   . Transfusion history     a. After childbirth 60 years ago (1957)  . Rash/skin eruption     a. Multiple episodes - wide distribution-intermittent, possibly drug rash.  . Pulmonary HTN     a. PASP on echo 04/2013.  [name] Chronic Right Pleural Effusion     a. 07/2012 s/p thoracentesis - Followed by Dr. Delford [facility].    Past Surgical History  Procedure Laterality Date  . Appendectomy    . Abdominal hysterectomy    . Tonsillectomy    . Kidney surgery      Right  . Ankle fracture surgery      Left, pin  . Pacemaker insertion  06/20/08  MDT EnRhythm DR implanted by Dr Reyes IvanKersey  . Breast surgery      Needle guided excision, right breast calcification  . Breast fibroadenoma surgery  2010    Right  . Skin graft      left leg     Medications: Current Outpatient Prescriptions  Medication Sig Dispense Refill  . Acetaminophen (TYLENOL EX ST ARTHRITIS PAIN PO) Take 1 tablet by mouth as needed. For Pain.    [name]. atenolol (TENORMIN) 50 MG tablet TAKE 1/2 TABLET BY MOUTH TWICE DAILY 90 tablet 1  . atorvastatin (LIPITOR) 40 MG tablet Take 0.5 tablets (20 mg total) by mouth at bedtime. 45 tablet 3  . Calcium-Vitamin D 600-200 MG-UNIT per tablet Take 1 tablet by mouth 2 (two) times daily.     [name]. diltiazem (CARDIZEM CD) 180 MG 24 hr capsule TAKE 1 CAPSULE BY MOUTH DAILY 90 capsule 1  . dorzolamide-timolol (COSOPT) 22.3-6.8 MG/ML ophthalmic solution Place 1 drop  into both eyes 2 (two) times daily.     . furosemide (LASIX) 80 MG tablet Take 1 tablet (80 mg total) by mouth 2 (two) times daily. For 4 days and then as directed. 180 tablet 3  . losartan (COZAAR) 100 MG tablet Take 50 mg by mouth at bedtime.    . Multiple Vitamins-Minerals (ICAPS AREDS FORMULA PO) Take 1 capsule by mouth 2 (two) times daily.     . NON FORMULARY Take 12,000 Units by mouth once a week.    . NON FORMULARY Place 2.5 L into the nose at bedtime. 2 liters of O2 nightly    . potassium chloride (K-DUR,KLOR-CON) 10 MEQ tablet Take 1 tablet (10 mEq total) by mouth 2 (two) times daily. 180 tablet 6  . SYNTHROID 125 MCG tablet TAKE 1 TABLET BY MOUTH DAILY 90 tablet 1  . travoprost, benzalkonium, (TRAVATAN) 0.004 % ophthalmic solution Place 1 drop into both eyes daily.     . Vitamin D, Ergocalciferol, (DRISDOL) 50000 UNITS CAPS capsule Take 50,000 Units by mouth once a week.   5  . warfarin (COUMADIN) 5 MG tablet TAKE AS DIRECTED BY COUMADIN CLINIC 40 tablet 3  . [DISCONTINUED] potassium chloride (KLOR-CON 10) 10 MEQ tablet Take 1 tablet (10 mEq total) by mouth 2 (two) times daily. 60 tablet 5   No current facility-administered medications for this visit.    Allergies: Allergies  Allergen Reactions  . Amoxicillin     Weakness  . Digoxin     REACTION: weakness, dehydration  . Risedronate Sodium     REACTION: rash "actonel"    Social History: The patient  reports that she has never smoked. She has never used smokeless tobacco. She reports that she does not drink alcohol or use illicit drugs.   Family History: The patient's family history includes Cancer in her cousin, mother, and paternal aunt; Diabetes in her mother; Heart attack in her brother and father; Heart disease in her father; Hyperlipidemia in her father; Hypertension in her father; Stroke in her brother.   Review of Systems: Please see the history of present illness.   Otherwise, the review of systems is positive for  dyspnea, leg swelling, hearing loss, visual disturbance, DOE, abdominal pain, back pain, easy bruising and balance issues.   All other systems are reviewed and negative.   Physical Exam: VS:  BP 130/90 mmHg  Pulse 71  Wt 109 lb 12.8 oz (49.805 kg)  SpO2 91% .  BMI Body mass index is 18.84 kg/(m^2).  Wt Readings from  Last 3 Encounters:  03/21/14 109 lb 12.8 oz (49.805 kg)  03/10/14 103 lb (46.72 kg)  03/09/14 103 lb (46.72 kg)    General: Pleasant. Chronically ill. Quite kyphotic but in no acute distress. Moving slow.  HEENT: Normal. Neck: Supple, no JVD, carotid bruits, or masses noted.  Cardiac: Irregular irregular rhythm. Rate is ok. No murmurs, rubs, or gallops. No edema.  Respiratory:  Lungs are clear to auscultation bilaterally with normal work of breathing.  GI: Soft and nontender.  MS: No deformity or atrophy. Gait and ROM intact.Using a cane.  Skin: Warm and dry. Color is normal.  Neuro:  Strength and sensation are intact and no gross focal deficits noted.  Psych: Alert, appropriate and with normal affect.   LABORATORY DATA:  EKG:  EKG is not ordered today.   Lab Results  Component Value Date   WBC 7.0 04/20/2013   HGB 12.9 04/20/2013   HCT 38.8 04/20/2013   PLT 212.0 04/20/2013   GLUCOSE 91 03/16/2014   CHOL 118 03/26/2011   TRIG 105.0 03/26/2011   HDL 51.10 03/26/2011   LDLCALC 46 03/26/2011   ALT 27 03/26/2011   AST 23 03/26/2011   NA 135 03/16/2014   K 4.1 03/16/2014   CL 104 03/16/2014   CREATININE 0.77 03/16/2014   BUN 26* 03/16/2014   CO2 26 03/16/2014   TSH 1.52 03/23/2013   INR 1.9* 03/16/2014    BNP (last 3 results) No results for input(s): BNP in the last 8760 hours.  ProBNP (last 3 results)  Recent Labs  05/04/13 1200 11/10/13 1228 03/10/14 1014  PROBNP 726.0* 418.0* 925.0*     Other Studies Reviewed Today:  Echo Study Conclusions from April 2015 - Left ventricle: The cavity size was normal. There was mild concentric  hypertrophy. Systolic function was normal. The estimated ejection fraction was in the range of 50% to 55%. Wall motion was normal; there were no regional wall motion abnormalities. The study was not technically sufficient to allow evaluation of LV diastolic dysfunction due to atrial fibrillation. - Aortic valve: No regurgitation. - Aortic root: The aortic root was normal in size. - Mitral valve: Calcified annulus. Mildly thickened leaflets . Trivial regurgitation. - Left atrium: The atrium was mildly dilated. - Right ventricle: Systolic function was mildly reduced. - Right atrium: The atrium was mildly to moderately dilated. Pacer wire or catheter noted in right atrium. - Tricuspid valve: Mild regurgitation. - Pulmonary arteries: Systolic pressure was mildly to moderately increased. PA peak pressure: 48mm Hg (S). - Pericardium, extracardiac: There was no pericardial effusion.   Assessment/Plan: 1. Chronic diastolic HF with recent exacerbation - treated with additional diuresis with improvement - her weight is back up - will increase her diuretic to 80 mg BID for 4 days, then try 80 mg in the am and 40 mg in the PM. She seems to be failing in a generalized fashion. Will get her back to see Dr. Swaziland in April.   2. Chronic atrial fibrillation: Rate controlled. Continue coumadin which she is tolerating.  3. Pulmonary HTN: FU with pulmonary as planned.   4. Essential hypertension, benign: BP controlled on current regimen which includes Atenolol, Diltiazem, Losartan.  5. BRADYCARDIA-TACHYCARDIA SYNDROME - has PPM in place - followed by EP -  Tells me that the "phone checking device" was too complicated for her. She sees Dr. Johney Frame in March.   6. CAD - continue with medical management  7. HLD (hyperlipidemia): on statin therapy  Current medicines are  reviewed with the patient today.  The patient does not have concerns regarding medicines other than what  has been noted above.  The following changes have been made:  See above.  Labs/ tests ordered today include:    Orders Placed This Encounter  Procedures  . Basic metabolic panel  . Brain natriuretic peptide    Disposition:   FU with Dr. Swaziland as planned.   Patient is agreeable to this plan and will call if any problems develop in the interim.   Signed: Rosalio Macadamia, RN, ANP-C 03/21/2014 2:53 PM  [facility] [address] [place], [place]  [address] Phone: [phone] Fax: [phone]

## 2014-03-21 NOTE — Patient Instructions (Addendum)
We will be checking the following labs today BMET & BNP  Increase your Lasix to 80 mg two times a day for the next 4 days, then may take 80 mg in the AM and 40 mg PM  I sent in a new RX for your Lasix  See Dr. SwazilandJordan in April  Call the [facility] office at [phone] if you have any questions, problems or concerns.

## 2014-04-12 ENCOUNTER — Encounter: Payer: Self-pay | Admitting: Internal Medicine

## 2014-04-12 ENCOUNTER — Other Ambulatory Visit: Payer: Self-pay

## 2014-04-12 ENCOUNTER — Ambulatory Visit (INDEPENDENT_AMBULATORY_CARE_PROVIDER_SITE_OTHER): Payer: Medicare Other | Admitting: Internal Medicine

## 2014-04-12 VITALS — BP 132/76 | HR 74 | Ht 64.0 in | Wt 106.0 lb

## 2014-04-12 DIAGNOSIS — I482 Chronic atrial fibrillation, unspecified: Secondary | ICD-10-CM

## 2014-04-12 DIAGNOSIS — Z950 Presence of cardiac pacemaker: Secondary | ICD-10-CM | POA: Diagnosis not present

## 2014-04-12 DIAGNOSIS — I2510 Atherosclerotic heart disease of native coronary artery without angina pectoris: Secondary | ICD-10-CM

## 2014-04-12 LAB — MDC_IDC_ENUM_SESS_TYPE_INCLINIC
Battery Voltage: 2.88 V
Brady Statistic RV Percent Paced: 62.33 %
Lead Channel Impedance Value: 456 Ohm
Lead Channel Pacing Threshold Amplitude: 1 V
Lead Channel Sensing Intrinsic Amplitude: 0.7 mV
Lead Channel Setting Pacing Pulse Width: 0.4 ms
Lead Channel Setting Sensing Sensitivity: 0.9 mV
MDC IDC MSMT LEADCHNL RV IMPEDANCE VALUE: 408 Ohm
MDC IDC MSMT LEADCHNL RV PACING THRESHOLD PULSEWIDTH: 0.4 ms
MDC IDC MSMT LEADCHNL RV SENSING INTR AMPL: 6.2 mV
MDC IDC SESS DTM: 20160316120135
MDC IDC SET LEADCHNL RV PACING AMPLITUDE: 2.5 V
MDC IDC SET ZONE DETECTION INTERVAL: 350 ms
Zone Setting Detection Interval: 400 ms

## 2014-04-12 NOTE — Patient Instructions (Signed)
Your physician wants you to follow-up in: 12 months with Gypsy BalsamAmber Seiler, NP You will receive a reminder letter in the mail two months in advance. If you don't receive a letter, please call our office to schedule the follow-up appointment.  Your physician recommends that you schedule a follow-up appointment in: 2 months with the device clinic .

## 2014-04-12 NOTE — Progress Notes (Signed)
PCP:  [name], MD Primary Cardiologist:  Dr Swaziland  The patient presents today for routine electrophysiology followup. She reports fatigue and SOB which is unchanged. Her edema is stable.  Today, she denies symptoms of palpitations, chest pain,  dizziness, presyncope, syncope, GI bleeding, or neurologic sequela. The patient feels that she is tolerating medications without difficulties and is otherwise without complaint today.   Past Medical History  Diagnosis Date  . Permanent atrial fibrillation     a. Chronic Coumadin.  Laurie Larsen HTN (hypertension)   . Chronic diastolic CHF (congestive heart failure), NYHA class 1     a. 04/2013 Echo: EF 50-55%, triv MR, mildly dil LA, PASP , mild TR, mild-mod RA dil.  Laurie Larsen CAD (coronary artery disease)     a. 2005 Cath: 100% dLCX-->Med managed.  . Sick sinus syndrome     a. 05/2008 s/p MDT EnRhythm DC PPM (Kassi Esteve).  . Chronic pain     a. ? due to arthritis  . Glaucoma   . Hyperlipidemia   . Diverticulitis large intestine   . Allergy   . Phlebitis     a. after pacemeker placement in 2010.  Laurie Larsen Colon polyps   . Hypothyroidism   . Transfusion history     a. After childbirth 60 years ago (1957)  . Rash/skin eruption     a. Multiple episodes - wide distribution-intermittent, possibly drug rash.  . Pulmonary HTN     a. PASP on echo 04/2013.  Laurie Larsen Chronic Right Pleural Effusion     a. 07/2012 s/p thoracentesis - Followed by Dr. Delford Field [facility].   Past Surgical History  Procedure Laterality Date  . Appendectomy    . Abdominal hysterectomy    . Tonsillectomy    . Kidney surgery      Right  . Ankle fracture surgery      Left, pin  . Pacemaker insertion  06/20/08    MDT EnRhythm DR implanted by Dr Reyes Ivan  . Breast surgery      Needle guided excision, right breast calcification  . Breast fibroadenoma surgery  2010    Right  . Skin graft      left leg    Current Outpatient Prescriptions  Medication Sig Dispense Refill  .  Acetaminophen (TYLENOL EX ST ARTHRITIS PAIN PO) Take 1 tablet by mouth daily as needed. For Pain.    Laurie Larsen atenolol (TENORMIN) 50 MG tablet TAKE 1/2 TABLET BY MOUTH TWICE DAILY 90 tablet 1  . atorvastatin (LIPITOR) 40 MG tablet Take 0.5 tablets (20 mg total) by mouth at bedtime. 45 tablet 3  . Calcium-Vitamin D 600-200 MG-UNIT per tablet Take 1 tablet by mouth 2 (two) times daily. Pt states she only take this occasionally (04/12/14)    . diltiazem (CARDIZEM CD) 180 MG 24 hr capsule TAKE 1 CAPSULE BY MOUTH DAILY 90 capsule 1  . dorzolamide-timolol (COSOPT) 22.3-6.8 MG/ML ophthalmic solution Place 1 drop into both eyes 2 (two) times daily.     . furosemide (LASIX) 80 MG tablet Take 1 tablet by mouth daily, may take an extra 1/2 tablet (40 mg) daily as needed for sweling    . HYDROcodone-acetaminophen (NORCO) 10-325 MG per tablet Take 1 tablet by mouth every 6 (six) hours as needed for moderate pain or severe pain.    Laurie Larsen losartan (COZAAR) 100 MG tablet Take 50 mg by mouth at bedtime.    . Multiple Vitamins-Minerals (ICAPS AREDS FORMULA PO) Take 1 capsule by mouth 2 (two) times daily.     Laurie Larsen  NON FORMULARY Take 12,000 Units by mouth once a week. Pt unsure of medication name (04/12/14)    . NON FORMULARY Place 2.5 L into the nose at bedtime.     . potassium chloride (K-DUR,KLOR-CON) 10 MEQ tablet Take 1 tablet (10 mEq total) by mouth 2 (two) times daily. 180 tablet 6  . SYNTHROID 125 MCG tablet TAKE 1 TABLET BY MOUTH DAILY 90 tablet 1  . travoprost, benzalkonium, (TRAVATAN) 0.004 % ophthalmic solution Place 1 drop into both eyes daily.     . Vitamin D, Ergocalciferol, (DRISDOL) 50000 UNITS CAPS capsule Take 50,000 Units by mouth once a week.   5  . warfarin (COUMADIN) 5 MG tablet TAKE AS DIRECTED BY COUMADIN CLINIC 40 tablet 3  . [DISCONTINUED] potassium chloride (KLOR-CON 10) 10 MEQ tablet Take 1 tablet (10 mEq total) by mouth 2 (two) times daily. 60 tablet 5   No current facility-administered medications for  this visit.    Allergies  Allergen Reactions  . Amoxicillin     Weakness  . Digoxin     REACTION: weakness, dehydration  . Risedronate Sodium     REACTION: rash "actonel"    History   Social History  . Marital Status: Married    Spouse Name: Laurie Larsen  . Number of Children: 3  . Years of Education: 13   Occupational History  . RETIRED    Social History Main Topics  . Smoking status: Never Smoker   . Smokeless tobacco: Never Used  . Alcohol Use: No  . Drug Use: No  . Sexual Activity: No   Other Topics Concern  . 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  Married 1948. 3 sons - 06/09/50, 06-09-55, 06-08-56; 5 grandchildren-one deceased -  OD @ 75.  Lives alone with husband and they are independent in ADLs.  End of Life Care: no prolonged heroic measures, i.e. Artificial feeding or hydration; DNR; no prolonged intubation.    Family History  Problem Relation Age of Onset  . Cancer Mother     colon with mets  . Diabetes Mother   . Heart disease Father   . Hyperlipidemia Father   . Hypertension Father   . Stroke Brother     brother who died Jun 18, 2022  . Cancer Paternal Aunt     breast  . Cancer Cousin     uterine, lung cancer  . Heart attack Brother   . Heart attack Father     Physical Exam: Filed Vitals:   04/12/14 1124  BP: 132/76  Pulse: 74  Height:  (1.626 m)  Weight: 106 lb (48.081 kg)    GEN- The patient is elderly appearing, alert and oriented x 3 today.   Head- normocephalic, atraumatic Eyes-  Sclera clear, conjunctiva pink Ears- hearing intact Oropharynx- clear Neck- supple,  Lungs- decreased BS at R base Chest- pacemaker pocket is well healed Heart- irregular rate and rhythm   GI- soft, NT, ND, + BS Extremities- no clubbing, cyanosis, 1+ edema (appears stable on exam)  Pacemaker interrogation- reviewed in detail today,  See PACEART report  Assessment  and Plan:  1. Bradycardia Normal pacemaker function See Pace Art report No changes today  2. afib Permanent V rates appear to be mostly controlled Continue coumadin long term, chads2vasc score is at least 5  3. SOB Unclear etiology Stable No change required today  Follow-up with Dr SwazilandJordan as scheduled  She declines remote monitoring stating that she "tried but it was too difficulty" Return to the device clinic in 6 months Follow-up with EP NP in 1 year

## 2014-04-14 LAB — PROTIME-INR: INR: 1.7 — AB (ref 0.9–1.1)

## 2014-04-17 ENCOUNTER — Ambulatory Visit (INDEPENDENT_AMBULATORY_CARE_PROVIDER_SITE_OTHER): Payer: BLUE CROSS/BLUE SHIELD | Admitting: Interventional Cardiology

## 2014-04-17 DIAGNOSIS — Z5181 Encounter for therapeutic drug level monitoring: Secondary | ICD-10-CM

## 2014-04-17 DIAGNOSIS — I482 Chronic atrial fibrillation, unspecified: Secondary | ICD-10-CM

## 2014-05-03 ENCOUNTER — Telehealth: Payer: Self-pay | Admitting: *Deleted

## 2014-05-03 LAB — PROTIME-INR: INR: 3.7 — AB (ref 0.9–1.1)

## 2014-05-03 NOTE — Telephone Encounter (Signed)
Patient is overdue for her INR recheck.  Called [facility] and spoke with Lawson FiscalLori, she states the patient just had her INR drawn today and the results will be in tomorrow 05/04/14.  She will fax results to us tomorrow when they return.   Fletcher Ostermiller, Sherrilee Gillesandance Bianca, RN

## 2014-05-04 ENCOUNTER — Ambulatory Visit (INDEPENDENT_AMBULATORY_CARE_PROVIDER_SITE_OTHER): Payer: BLUE CROSS/BLUE SHIELD | Admitting: Cardiovascular Disease

## 2014-05-04 DIAGNOSIS — Z5181 Encounter for therapeutic drug level monitoring: Secondary | ICD-10-CM

## 2014-05-04 DIAGNOSIS — I482 Chronic atrial fibrillation, unspecified: Secondary | ICD-10-CM

## 2014-05-15 LAB — PROTIME-INR: INR: 2.7 — AB (ref 0.9–1.1)

## 2014-05-16 ENCOUNTER — Ambulatory Visit (INDEPENDENT_AMBULATORY_CARE_PROVIDER_SITE_OTHER): Payer: BLUE CROSS/BLUE SHIELD | Admitting: Interventional Cardiology

## 2014-05-16 DIAGNOSIS — I482 Chronic atrial fibrillation, unspecified: Secondary | ICD-10-CM

## 2014-05-16 DIAGNOSIS — Z5181 Encounter for therapeutic drug level monitoring: Secondary | ICD-10-CM

## 2014-05-20 NOTE — Discharge Summary (Signed)
PATIENT NAME:  Laurie Larsen, Laurie Larsen MR#:  [id number] DATE OF BIRTH:  12-Dec-1923  DATE OF ADMISSION:  09/12/2013 DATE OF DISCHARGE:  09/16/2013  DIAGNOSES AT TIME OF DISCHARGE:  1. Shortness of breath secondary to acute diastolic congestive heart failure.  2. Atrial fibrillation.  3. Right pleural effusion.  4. Hypothyroidism.  5. Depression.   CHIEF COMPLAINT: Shortness of breath.   HISTORY OF PRESENT ILLNESS: Laurie Larsen is an 81 year old female with history of chronic atrial fibrillation, CHF, hypothyroidism, depression, who presented to the ED complaining of shortness of breath that came on the night prior to admission. The patient also had been experiencing some lower extremity edema for the last few days. She denies any nausea, vomiting, or diarrhea.   PAST MEDICAL HISTORY: Significant for atrial fibrillation on chronic adequate coagulation with Coumadin, history of CHF, pacemaker placement, hypertension, hypothyroidism, depression. Please see H and P for full details.   HOSPITAL COURSE: The patient was [facility] and received intravenous Lasix.  It was felt that she would benefit from thoracentesis, but her effusion gradually improved. She was also seen by cardiologist [facility]. Initial chest x-ray showed congestive heart failure with pulmonary edema and bilateral pleural effusions. A repeat chest x-ray showed some similar small right pleural effusion with adjacent atelectasis. The patient's renal function was stable although her BUN did go up with diuresis. Her troponins were negative. She was ruled out for an MI. She was also seen by physical therapy and was stable at the time of discharge.    DISCHARGE MEDICATIONS:  Losartan 50 mg once a day, furosemide 40 mg b.i.d., atenolol 25 mg b.i.d., Cosopt and Travatan eyedrops, escitalopram 10 mg once a day, Synthroid 125 mcg once a day, tramadol 50 mg 1-2 tablets 4 times a day as needed, KCl 10 mEq b.i.d., Lipitor  20 mg once a day, warfarin 5 mg once a day, diltiazem 180/24 one capsule once a day.   FOLLOWUP: The patient was advised to low sodium diet and to follow with me, Dr. Marcello FennelHande, and also follow up with Dr. Mariah [facility]Gollan in the [facility] and to follow up with the CHF clinic as well. She was stable at the time of discharge.   TOTAL TIME SPENT IN DISCHARGING THE PATIENT: 35 minutes.    ____________________________ Barbette ReichmannVishwanath Lajean Boese, MD vh:bu D: 10/06/2013 18:46:00 ET T: 10/06/2013 19:23:44 ET JOB#: [id number]  cc: Barbette ReichmannVishwanath Elizibeth Breau, MD, <Dictator> Barbette ReichmannVISHWANATH Aunica Dauphinee MD ELECTRONICALLY SIGNED 10/18/2013 17:54

## 2014-05-20 NOTE — H&P (Signed)
PATIENT NAME:  Laurie Larsen, BONENFANT MR#:  [id number] DATE OF BIRTH:  05-01-1923  DATE OF ADMISSION:  09/12/2013  PRIMARY CARE PROVIDER: [name], MD.  EMERGENCY DEPARTMENT REFERRING PHYSICIAN:  Randon Goldsmith. Shaune Pollack, MD.  PRIMARY CARDIOLOGIST: Peter Swaziland, MD, at [facility] cardiology in [place].   CHIEF COMPLAINT: Shortness of breath.   HISTORY OF PRESENT ILLNESS: The patient is an 81 year old white female with history of chronic atrial fibrillation, history of CHF; type unknown, hypertension, hypothyroidism, depression who presents with acute onset of shortness of breath since last night. The patient reports that she does have shortness of breath at baseline, at even rest, and some activity but only uses oxygen at nighttime but developed shortness of breath since yesterday. She has also been having lower extremity swelling, ongoing for the past few days. She has had a dry cough, but has not had any wheezing. She also felt her chest feeling heavy since yesterday. She otherwise denies any nausea, vomiting, or diarrhea. She reports that last night she had to sit up because of shortness of breath. She denies any nausea, vomiting, or diarrhea any urinary symptoms.   PAST MEDICAL HISTORY: 1. History of atrial fibrillation on chronic anticoagulation with Coumadin.  2. History of CHF, type unknown.  3. History of pacemaker placement.  4. Hypertension.  5. Hypothyroidism.  6. Depression.   ALLERGIES: ACTONEL, AMOXICILLIN, AND DIGOXIN.   CURRENT HOME MEDICATIONS: Warfarin 5 mg daily, tramadol 50 one to 2 tabs q. 4 p.r.n. as needed, Synthroid 125 mcg daily, sertraline 25 daily, potassium chloride 10 mEq 1 tab p.o. b.i.d., Norco 5/325 one tab p.o. b.i.d. as needed, losartan 100 daily, Lipitor 20 daily, Lasix 80 daily, diltiazem 180 mg 1 tab p.o. daily.   SOCIAL HISTORY: Does not smoke. Does not drink. Lives with her husband at home.   FAMILY HISTORY: Positive for hypertension.   REVIEW OF SYSTEMS:  .  CONSTITUTIONAL: Denies any fevers. Complains of fatigue, weakness. No weight loss or weight gain.  EYES: No blurred or double vision. No pain. No redness. Reports has a history of glaucoma and cataracts.  EAR, NOSE AND THROAT: No tinnitus. No ear pain. No allergies seasonal or year-round allergies. No epistaxis. No nasal discharge. No difficulty swallowing.  RESPIRATORY: Complains of dry cough, but no wheezing, no hemoptysis. Has chronic dyspnea. No COPD. No TB.  CARDIOVASCULAR: Complains of chest pressure and edema. Has atrial fibrillation. Complains of dyspnea on exertion. No palpitation. No syncope.  GASTROINTESTINAL: No nausea, vomiting, diarrhea. No abdominal pain. No hematemesis. No melena. No GERD. No IBS. No jaundice.  GENITOURINARY: Denies any dysuria, hematuria, renal calc, or frequency.  ENDOCRINE: Denies any polyuria, nocturia. Has a history of hypothyroidism.  HEMATOLOGIC AND LYMPHATIC: Denies anemia, easy bruisability, or bleeding.  SKIN: No acne. No rash.  MUSCULOSKELETAL: Denies any pain in the neck, back, or shoulder.  NEUROLOGIC: No numbness, CVA, TIA, or seizures.  PSYCHIATRIC: Had depression.   PHYSICAL EXAMINATION: VITAL SIGNS: Temperature 98.2, pulse 82, respirations 25, blood pressure 194/80, oxygen saturation  98% on 2 liters.  GENERAL: The patient is a very frail-looking female.  HEENT: Head atraumatic, normocephalic. Pupils equally round, reactive to light and accommodation. There is no conjunctival pallor. No scleral icterus. Nasal exam shows no drainage or ulceration. Oropharynx: Clear without any exudate. Nose: No nasal lesions. No drainage. Ears: There is no drainage or external lesions. Mouth: No lesions or exudate.  NECK: Supple and symmetric. No masses. Thyroid midline. No JVD.  RESPIRATORY: The patient has  bilateral crackles. No accessory muscle usage. CARDIOVASCULAR: Irregularly irregular heart rhythm. No murmurs, rubs, gallops.  ABDOMEN: Soft, nontender,  nondistended. Positive bowel sounds x 4. No hepatosplenomegaly.  GENITOURINARY: Deferred.  MUSCULOSKELETAL: There is no erythema or swelling.  SKIN: No rash.  LYMPHATICS: No lymph nodes palpable.  VASCULAR: Good DP, PT pulses.  NEUROLOGIC: Cranial nerves II through XII grossly intact. No focal deficits.  PSYCHIATRIC: Not anxious or depressed.   LABORATORY DATA: Glucose 109, BUN 14, creatinine 0.67, sodium 141, potassium 3.5, chloride 106, CO2 25, calcium 8.4. Troponin less than 0.02. WBC 8.9, hemoglobin 13.2, platelet count 194,000. INR is 2.2.   Chest x-ray shows congestive heart failure with pulmonary edema bilateral effusions. EKG shows atrial fibrillation with T-wave inversions.   ASSESSMENT AND PLAN: The patient is an 81 year old white female with history of congestive heart failure, type unknown, [facility] who presents here with shortness of breath.  1. Shortness of breath due to acute congestive heart failure, type unknown. Echo done in Spring of this year at [facility]. We will ask [facility] to come and evaluate the patient. They can obtain the records to review to see if she needs another echocardiogram. We will place her on intravenous Lasix and continue her losartan.  2. Atrial fibrillation. We will continue the patient on Cardizem and Coumadin. Monitor on telemetry.  3. Hypertension. Continue losartan.  4. Hypothyroidism. Continue Synthroid.  5. Depression. Continue sertraline.  6. Miscellaneous. The patient is already on Coumadin, which should be sufficient for deep vein thrombosis prophylaxis.   TIME SPENT: 50 minutes.    ____________________________ Lacie ScottsShreyang H. Allena KatzPatel, MD shp:NT D: 09/12/2013 13:51:57 ET T: 09/12/2013 14:34:38 ET JOB#: [id number]  cc: Lochlyn Zullo H. Allena KatzPatel, MD, <Dictator> Charise CarwinSHREYANG H Zerenity Bowron MD ELECTRONICALLY SIGNED 09/18/2013 13:58

## 2014-05-20 NOTE — Consult Note (Signed)
General Aspect Primary Cardiologist: P. Martinique, MD Pulmonologist: Tia Masker, MD _____________  81 y/o female w/ h/o permanent afib, chronic diast chf, and chronic transudative right pleural effusion who was admitted today 2/2 chest heaviness and dyspnea. _____________  Past Medical History  1.  Permanent Afib      a. Chronic coumadin. 2.  Chronic Diastolic CHF      a. 08/163 Echo: EF 50-55%, triv MR, mildly dil LA, PASP 39mHg, mild TR, mild-mod RA dil. 3.  CAD      a. 2005 Cath: 100% dLCX-->med managed. 4.  Atypical Chest Pain 5.  SSS      a. S/P PPM by J. Allred, MD 05/2008: MDT EnRhythm DC PPM. 6.  Chronic Pain 7.  Glaucoma 8.  HL 9.  H/O colon polyps and colonic diverticulitis 10.  Hypothyroidism 11.  PAH      a. PASP 473mg on echo 04/2013. 12.  Chronic Transudative R Pleural Effusion      a. 07/2012 s/p thoracentesis.      b. [facility] _____________   Present Illness [age]/o female with the above complex problem list.  She was last seen in clinic by Dr. JoMartiniquen May of this year, at which time she c/o mild LEE and DOE, along with atypical, sharp/shooting left sided chest pain.  Her lasix dose was increased from 4035maily to 69m4mily.  She says that since that time her wt has been pretty stable however she has noted some increase in abd girth and intermittent LEE.  She continues to have intermittent sharp pain starting in her left axilla and moving under her left breast, which usually lasts an hour and resolves after taking ASA.  She has chronic DOE and thus limits her activity, but is generally able to ambulate with a walker around her house.  Last night, she woke up several times with diffuse chest tightness and orthopnea.  This persisted throughout the night and into this AM.  She was evaluated by staff @ [facility] and was apparently hypoxic.  Decision was made to have her transported to the ED.  Here, CXR shows CHF with a large effusion  on the right.  BNP was elevated @ 7658 and she was admitted and placed on IV lasix.  Currently, she has not dyspnea @ rest and denies chest pain, though says that she feels very weak.   Physical Exam:  GEN pleasant, nad.   HEENT pink conjunctivae, moist oral mucosa   NECK supple  No masses  no bruits, JVP ~ 10cm.   RESP normal resp effort  bibasilar crackles with diminished breath sounds ~ 1/2 up on the right.   CARD Irregular rate and rhythm   ABD diffusely tender, non-distended, bs+x4.   LYMPH negative neck   EXTR negative cyanosis/clubbing, trace bilat LE edema.   SKIN normal to palpation   NEURO cranial nerves intact, cranial nerves deficit   PSYCH alert, A+O to time, place, person, good insight   Review of Systems:  General: Weakness  malaise.   Skin: No Complaints   ENT: No Complaints   Eyes: No Complaints   Neck: No Complaints   Respiratory: Short of breath   Cardiovascular: Chest pain or discomfort  Dyspnea  Orthopnea  Edema   Gastrointestinal: sometimes has epigastric pain after eating.   Genitourinary: No Complaints   Vascular: No Complaints   Musculoskeletal: No Complaints   Neurologic: No Complaints   Hematologic: No Complaints  Endocrine: No Complaints   Psychiatric: No Complaints   Review of Systems: All other systems were reviewed and found to be negative   Medications/Allergies Reviewed Medications/Allergies reviewed   Family & Social History:  Family and Social History:  Family History Father and brother died with CAD and afib.   Social History negative tobacco, negative ETOH, negative Illicit drugs   Place of Living Lives at [place] with husband.  Ambulates w/ walker.   Home Medications: Medication Instructions Status  diltiazem 180 mg/24 hours oral capsule, extended release 1 cap(s) orally once a day Active  warfarin 5 mg oral tablet 1 tab(s) orally once a day Active  Lasix 80 mg oral tablet 1 tab(s) orally once a day  Active  Lipitor 20 mg oral tablet 1 tab(s) orally once a day (at bedtime) Active  potassium chloride 10 mEq oral capsule, extended release 1 cap(s) orally 2 times a day Active  traMADol 50 mg oral tablet 1-2 tab(s) orally 4 times a day, As Needed Active  Synthroid 125 mcg (0.125 mg) oral tablet 1 tab(s) orally once a day Active  Norco 325 mg-5 mg oral tablet 1 tab(s) orally 2 times a day, As Needed Active  losartan 100 mg oral tablet 1 tab(s) orally once a day Active  escitalopram 10 mg oral tablet 1 tab(s) orally once a day Active  Calcium 500+D 1  orally once a day Active  Travatan Z 0.004% ophthalmic solution 1 drop(s) to each affected eye once a day (in the evening) Active  Cosopt 2.23%-0.68% ophthalmic solution 1 drop(s) to each affected eye 2 times a day Active  atenolol 25 mg oral tablet 1 cap(s) orally 2 times a day Active   Lab Results:  Routine Chem:  17-Aug-15 10:36   B-Type Natriuretic Peptide [facility])  7658 (Result(s) reported on 12 Sep 2013 at 12:15PM.)  Glucose, Serum  109  BUN 14  Creatinine (comp) 0.67  Sodium, Serum 141  Potassium, Serum 3.5  Chloride, Serum 106  CO2, Serum 25  Calcium (Total), Serum  8.4  Anion Gap 10  Osmolality (calc) 282  eGFR (African American) >60  eGFR (Non-African American) >60 (eGFR values <58m/min/1.73 m2 may be an indication of chronic kidney disease (CKD). Calculated eGFR is useful in patients with stable renal function. The eGFR calculation will not be reliable in acutely ill patients when serum creatinine is changing rapidly. It is not useful in  patients on dialysis. The eGFR calculation may not be applicable to patients at the low and high extremes of body sizes, pregnant women, and vegetarians.)  Cardiac:  17-Aug-15 10:36   Troponin I < 0.02 (0.00-0.05 0.05 ng/mL or less: NEGATIVE  Repeat testing in 3-6 hrs  if clinically indicated. >0.05 ng/mL: POTENTIAL  MYOCARDIAL INJURY. Repeat  testing in 3-6 hrs if  clinically  indicated. NOTE: An increase or decrease  of 30% or more on serial  testing suggests a  clinically important change)    14:28   CK, Total 62 (26-192 NOTE: NEW REFERENCE RANGE  02/28/2013)  CPK-MB, Serum 1.7 (Result(s) reported on 12 Sep 2013 at 03:00PM.)  Troponin I < 0.02 (0.00-0.05 0.05 ng/mL or less: NEGATIVE  Repeat testing in 3-6 hrs  if clinically indicated. >0.05 ng/mL: POTENTIAL  MYOCARDIAL INJURY. Repeat  testing in 3-6 hrs if  clinically indicated. NOTE: An increase or decrease  of 30% or more on serial  testing suggests a  clinically important change)  Routine Coag:  17-Aug-15 10:36   Prothrombin  24.2  INR 2.2 (INR reference interval applies to patients on anticoagulant therapy. A single INR therapeutic range for coumarins is not optimal for all indications; however, the suggested range for most indications is 2.0 - 3.0. Exceptions to the INR Reference Range may include: Prosthetic heart valves, acute myocardial infarction, prevention of myocardial infarction, and combinations of aspirin and anticoagulant. The need for a higher or lower target INR must be assessed individually. Reference: The Pharmacology and Management of the Vitamin K  antagonists: the seventh ACCP Conference on Antithrombotic and Thrombolytic Therapy. WYOVZ.8588 Sept:126 (3suppl): [id number]. A HCT value >55% may artifactually increase the PT.  In one study,  the increase was an average of 25%. Reference:  "Effect on Routine and Special Coagulation Testing Values of Citrate Anticoagulant Adjustment in Patients with High HCT Values." American Journal of Clinical Pathology 2006;126:400-405.)  Routine Hem:  17-Aug-15 10:36   WBC (CBC) 8.9  RBC (CBC) 4.32  Hemoglobin (CBC) 13.2  Hematocrit (CBC) 39.7  Platelet Count (CBC) 194 (Result(s) reported on 12 Sep 2013 at 10:46AM.)  MCV 92  MCH 30.5  MCHC 33.2  RDW 14.1   EKG:  EKG Interp. by me   Interpretation afib, 95, inf twi, poor r prog  - no acute changes.   Radiology Results: XRay:    17-Aug-15 10:48, Chest Portable Single View  Chest Portable Single View   REASON FOR EXAM:    chest pressure  COMMENTS:       PROCEDURE: DXR - DXR PORTABLE CHEST SINGLE VIEW  - Sep 12 2013 10:48AM     CLINICAL DATA:  Shortness of breath.  Chest pressure.    EXAM:  PORTABLE CHEST - 1 VIEW    COMPARISON:  None.    FINDINGS:  Mediastinum and hilar structures are normal. Cardiac pacer noted  with lead tips projecting over the right atrium and right ventricle.  Cardiomegaly with pulmonary vascular prominence and diffuse  interstitial prominence with bilateral pleural effusions. These  findings consistent with congestive heart failure. Basilar  atelectasis present. No pneumothorax. No acute osseous abnormality.     IMPRESSION:  Congestive heart failure with pulmonary edema bilateral pleural  effusions.      Electronically Signed    By: Marcello Moores  Register    On: 09/12/2013 10:59       Verified By: Osa Craver, M.D., MD    Actonel: Unknown  Digoxin: Unknown  Amoxicillin: Unknown  Vital Signs/Nurse's Notes: **Vital Signs.:   17-Aug-15 14:15  Vital Signs Type Admission  Temperature Temperature (F) 98.1  Celsius 36.7  Temperature Source oral  Pulse Pulse 97  Respirations Respirations 18  Systolic BP Systolic BP 502  Diastolic BP (mmHg) Diastolic BP (mmHg) 77  Mean BP 99  Pulse Ox % Pulse Ox % 93  Pulse Ox Activity Level  At rest  Oxygen Delivery 4L    Impression 1.  Acute on chronic diastolic chf:  Pt presents with a several month h/o progressive dyspnea with stable weight.  Last night she developed chest heaviness and orthopnea.  CXR shows CHF along with a larger R sided effusion c/w last CXR in March 2015 (Epic).  She appears to have mild volume overload on exam.  Cont IV diuresis.  Cont po dilt and resume home dose of bb therapy (atenolol 25 bid).  Given size of R effusion and h/o chronic R transudative  effusion, I would not be surprised if her breathing does not improve signiciantly with diuresis, and ultimately requires thoracentesis.  2.  Acute  on chronic R transudative pleural effusion:  As above, effusion is much larger since last cxr in 03/2013.  Diuresis as above but may ultimately require repeat thoracentesis (last 07/2012).  Will place coumadin on hold and f/u cxr in the AM.  3.  Permanent Afib:  Cont ccb.  Resume bb.  Hold coumadin in case she requires thoracentesis.  4.  HTN:  BP trending up in setting of acute illness.  Follow with diuresis and resume bb.  5.  Chest heaviness w/ h/o atypical/sharp chest pain:  Despite chest heaviness all night, troponins are negative and though abnl, ECG is w/o acute st/t changes.  She has known occlusion of the distal LCX on cath in 2005.  Resume BB/statin.  Suspect Ss primarily 2/2 orthopnea/CHF.   Electronic Signatures for Addendum Section:  Kathlyn Sacramento (MD) (Signed Addendum 17-Aug-15 18:37)  The patient was seen and examined. Agree with the above. She presented with worsening dyspnea with fluid overload. Has right side pleural effusion.  Continue diuresis. She will likely require theraputic thoracentesis.   Electronic Signatures: Kathlyn Sacramento (MD)  (Signed 17-Aug-15 18:37)  Co-Signer: General Aspect/Present Illness, History and Physical Exam, Review of System, Family & Social History, Home Medications, Labs, EKG , Radiology, Allergies, Vital Signs/Nurse's Notes, Impression/Plan Rogelia Mire (NP)  (Signed 17-Aug-15 17:23)  Authored: General Aspect/Present Illness, History and Physical Exam, Review of System, Family & Social History, Home Medications, Labs, EKG , Radiology, Allergies, Vital Signs/Nurse's Notes, Impression/Plan   Last Updated: 17-Aug-15 18:37 by Kathlyn Sacramento (MD)

## 2014-05-26 ENCOUNTER — Encounter: Payer: Self-pay | Admitting: Internal Medicine

## 2014-05-30 ENCOUNTER — Telehealth: Payer: Self-pay | Admitting: *Deleted

## 2014-05-30 LAB — PROTIME-INR: INR: 3.9 — AB (ref 0.9–1.1)

## 2014-05-30 NOTE — Telephone Encounter (Signed)
[facility]ed [facility] to inquire about the patients INR that was due on 05/29/14. Left a message to return a call to the [facility].

## 2014-05-31 ENCOUNTER — Emergency Department: Payer: Medicare Other

## 2014-05-31 ENCOUNTER — Ambulatory Visit (INDEPENDENT_AMBULATORY_CARE_PROVIDER_SITE_OTHER): Payer: BLUE CROSS/BLUE SHIELD | Admitting: Cardiology

## 2014-05-31 ENCOUNTER — Encounter: Payer: Self-pay | Admitting: Emergency Medicine

## 2014-05-31 ENCOUNTER — Inpatient Hospital Stay
Admission: EM | Admit: 2014-05-31 | Discharge: 2014-06-04 | DRG: 291 | Disposition: A | Discharge status: Home / Self Care | Payer: Medicare Other | Attending: Internal Medicine | Admitting: Internal Medicine

## 2014-05-31 DIAGNOSIS — I4891 Unspecified atrial fibrillation: Secondary | ICD-10-CM | POA: Diagnosis present

## 2014-05-31 DIAGNOSIS — Z681 Body mass index (BMI) 19 or less, adult: Secondary | ICD-10-CM | POA: Diagnosis not present

## 2014-05-31 DIAGNOSIS — I509 Heart failure, unspecified: Secondary | ICD-10-CM | POA: Diagnosis present

## 2014-05-31 DIAGNOSIS — I482 Chronic atrial fibrillation, unspecified: Secondary | ICD-10-CM

## 2014-05-31 DIAGNOSIS — I5033 Acute on chronic diastolic (congestive) heart failure: Secondary | ICD-10-CM | POA: Diagnosis present

## 2014-05-31 DIAGNOSIS — Z833 Family history of diabetes mellitus: Secondary | ICD-10-CM | POA: Diagnosis not present

## 2014-05-31 DIAGNOSIS — I2510 Atherosclerotic heart disease of native coronary artery without angina pectoris: Secondary | ICD-10-CM | POA: Diagnosis present

## 2014-05-31 DIAGNOSIS — J90 Pleural effusion, not elsewhere classified: Secondary | ICD-10-CM

## 2014-05-31 DIAGNOSIS — I10 Essential (primary) hypertension: Secondary | ICD-10-CM | POA: Diagnosis present

## 2014-05-31 DIAGNOSIS — E43 Unspecified severe protein-calorie malnutrition: Secondary | ICD-10-CM | POA: Diagnosis present

## 2014-05-31 DIAGNOSIS — Z9111 Patient's noncompliance with dietary regimen: Secondary | ICD-10-CM | POA: Diagnosis present

## 2014-05-31 DIAGNOSIS — J9600 Acute respiratory failure, unspecified whether with hypoxia or hypercapnia: Secondary | ICD-10-CM | POA: Diagnosis present

## 2014-05-31 DIAGNOSIS — H409 Unspecified glaucoma: Secondary | ICD-10-CM | POA: Diagnosis present

## 2014-05-31 DIAGNOSIS — Z7901 Long term (current) use of anticoagulants: Secondary | ICD-10-CM

## 2014-05-31 DIAGNOSIS — Z8249 Family history of ischemic heart disease and other diseases of the circulatory system: Secondary | ICD-10-CM | POA: Diagnosis not present

## 2014-05-31 DIAGNOSIS — Z9981 Dependence on supplemental oxygen: Secondary | ICD-10-CM | POA: Diagnosis not present

## 2014-05-31 DIAGNOSIS — E039 Hypothyroidism, unspecified: Secondary | ICD-10-CM | POA: Diagnosis present

## 2014-05-31 DIAGNOSIS — Z79899 Other long term (current) drug therapy: Secondary | ICD-10-CM

## 2014-05-31 DIAGNOSIS — J9601 Acute respiratory failure with hypoxia: Secondary | ICD-10-CM | POA: Diagnosis not present

## 2014-05-31 DIAGNOSIS — Z66 Do not resuscitate: Secondary | ICD-10-CM | POA: Diagnosis present

## 2014-05-31 DIAGNOSIS — Z95810 Presence of automatic (implantable) cardiac defibrillator: Secondary | ICD-10-CM | POA: Diagnosis not present

## 2014-05-31 DIAGNOSIS — Z950 Presence of cardiac pacemaker: Secondary | ICD-10-CM | POA: Diagnosis not present

## 2014-05-31 DIAGNOSIS — I340 Nonrheumatic mitral (valve) insufficiency: Secondary | ICD-10-CM | POA: Diagnosis not present

## 2014-05-31 DIAGNOSIS — Z823 Family history of stroke: Secondary | ICD-10-CM

## 2014-05-31 DIAGNOSIS — E785 Hyperlipidemia, unspecified: Secondary | ICD-10-CM | POA: Diagnosis present

## 2014-05-31 DIAGNOSIS — R0602 Shortness of breath: Secondary | ICD-10-CM

## 2014-05-31 LAB — CBC WITH DIFFERENTIAL/PLATELET
BASOS PCT: 1 %
Basophils Absolute: 0.1 10*3/uL (ref 0–0.1)
EOS ABS: 0.1 10*3/uL (ref 0–0.7)
EOS PCT: 2 %
HEMATOCRIT: 40.3 % (ref 35.0–47.0)
Hemoglobin: 13.3 g/dL (ref 12.0–16.0)
Lymphocytes Relative: 5 %
Lymphs Abs: 0.5 10*3/uL — ABNORMAL LOW (ref 1.0–3.6)
MCH: 30.1 pg (ref 26.0–34.0)
MCHC: 33.1 g/dL (ref 32.0–36.0)
MCV: 90.9 fL (ref 80.0–100.0)
MONO ABS: 0.6 10*3/uL (ref 0.2–0.9)
Monocytes Relative: 7 %
NEUTROS ABS: 7.3 10*3/uL — AB (ref 1.4–6.5)
Neutrophils Relative %: 85 %
Platelets: 215 10*3/uL (ref 150–440)
RBC: 4.43 MIL/uL (ref 3.80–5.20)
RDW: 14.4 % (ref 11.5–14.5)
WBC: 8.5 10*3/uL (ref 3.6–11.0)

## 2014-05-31 LAB — COMPREHENSIVE METABOLIC PANEL
ALBUMIN: 4.1 g/dL (ref 3.5–5.0)
ALK PHOS: 113 U/L (ref 38–126)
ALT: 23 U/L (ref 14–54)
AST: 30 U/L (ref 15–41)
Anion gap: 8 (ref 5–15)
BUN: 19 mg/dL (ref 6–20)
CALCIUM: 9 mg/dL (ref 8.9–10.3)
CO2: 25 mmol/L (ref 22–32)
Chloride: 106 mmol/L (ref 101–111)
Creatinine, Ser: 0.57 mg/dL (ref 0.44–1.00)
GFR calc non Af Amer: >60 mL/min (ref >60)
GLUCOSE: 109 mg/dL — AB (ref 65–99)
Potassium: 3.5 mmol/L (ref 3.5–5.1)
Sodium: 139 mmol/L (ref 135–145)
TOTAL PROTEIN: 7.7 g/dL (ref 6.5–8.1)
Total Bilirubin: 0.8 mg/dL (ref 0.3–1.2)

## 2014-05-31 LAB — BRAIN NATRIURETIC PEPTIDE: B NATRIURETIC PEPTIDE 5: 1124 pg/mL — AB (ref 0.0–100.0)

## 2014-05-31 LAB — TROPONIN I: TROPONIN I: 0.03 ng/mL (ref <0.031)

## 2014-05-31 LAB — PROTIME-INR
INR: 3.16
Prothrombin Time: 32.5 seconds — ABNORMAL HIGH (ref 11.4–15.0)

## 2014-05-31 LAB — APTT: aPTT: 35 seconds (ref 24–36)

## 2014-05-31 MED ORDER — DILTIAZEM HCL ER COATED BEADS 180 MG PO CP24
180.0000 mg | ORAL_CAPSULE | Freq: Every day | ORAL | Status: DC
  Administered 2014-06-01 – 2014-06-04 (×4): 180 mg via ORAL
  Filled 2014-05-31 (×5): qty 1

## 2014-05-31 MED ORDER — LEVOTHYROXINE SODIUM 75 MCG PO TABS
125.0000 ug | ORAL_TABLET | Freq: Every day | ORAL | Status: DC
  Administered 2014-06-01 – 2014-06-04 (×4): 125 ug via ORAL
  Filled 2014-05-31 (×4): qty 1

## 2014-05-31 MED ORDER — ENSURE ENLIVE PO LIQD
237.0000 mL | Freq: Two times a day (BID) | ORAL | Status: DC
  Administered 2014-06-01 – 2014-06-03 (×5): 237 mL via ORAL

## 2014-05-31 MED ORDER — SENNOSIDES-DOCUSATE SODIUM 8.6-50 MG PO TABS
1.0000 | ORAL_TABLET | Freq: Every evening | ORAL | Status: DC | PRN
Start: 2014-05-31 — End: 2014-06-04
  Administered 2014-06-02: 1 via ORAL
  Filled 2014-05-31: qty 1

## 2014-05-31 MED ORDER — LEVOTHYROXINE SODIUM 75 MCG PO TABS: 125.0000 ug | ORAL_TABLET | Freq: Every day | ORAL | Status: DC

## 2014-05-31 MED ORDER — SODIUM CHLORIDE 0.9 % IJ SOLN
3.0000 mL | Freq: Two times a day (BID) | INTRAMUSCULAR | Status: DC
  Administered 2014-05-31 – 2014-06-04 (×9): 3 mL via INTRAVENOUS
  Filled 2014-05-31: qty 10

## 2014-05-31 MED ORDER — CALCIUM CARBONATE-VITAMIN D 500-200 MG-UNIT PO TABS
1.0000 | ORAL_TABLET | Freq: Every day | ORAL | Status: DC
  Administered 2014-06-01 – 2014-06-04 (×4): 1 via ORAL
  Filled 2014-05-31 (×4): qty 1

## 2014-05-31 MED ORDER — ACETAMINOPHEN 325 MG PO TABS
650.0000 mg | ORAL_TABLET | Freq: Four times a day (QID) | ORAL | Status: DC | PRN
  Administered 2014-05-31: 650 mg via ORAL
  Filled 2014-05-31 (×2): qty 2

## 2014-05-31 MED ORDER — TRAMADOL HCL 50 MG PO TABS: 50.0000 mg | ORAL_TABLET | Freq: Three times a day (TID) | ORAL | Status: DC | PRN

## 2014-05-31 MED ORDER — POTASSIUM CHLORIDE CRYS ER 10 MEQ PO TBCR
10.0000 meq | EXTENDED_RELEASE_TABLET | Freq: Two times a day (BID) | ORAL | Status: DC
  Administered 2014-05-31: 10 meq via ORAL
  Filled 2014-05-31: qty 1

## 2014-05-31 MED ORDER — DORZOLAMIDE HCL-TIMOLOL MAL 2-0.5 % OP SOLN
1.0000 [drp] | Freq: Two times a day (BID) | OPHTHALMIC | Status: DC
  Administered 2014-05-31 – 2014-06-04 (×8): 1 [drp] via OPHTHALMIC
  Filled 2014-05-31: qty 10

## 2014-05-31 MED ORDER — TRAVOPROST (BAK FREE) 0.004 % OP SOLN
1.0000 [drp] | Freq: Every day | OPHTHALMIC | Status: DC
  Administered 2014-06-01: 1 [drp] via OPHTHALMIC
  Filled 2014-05-31: qty 2.5

## 2014-05-31 MED ORDER — HYDROCODONE-ACETAMINOPHEN 10-325 MG PO TABS
1.0000 | ORAL_TABLET | Freq: Four times a day (QID) | ORAL | Status: DC | PRN
  Administered 2014-06-02 (×2): 1 via ORAL
  Filled 2014-05-31 (×2): qty 1

## 2014-05-31 MED ORDER — VITAMIN D (ERGOCALCIFEROL) 1.25 MG (50000 UNIT) PO CAPS
50000.0000 [IU] | ORAL_CAPSULE | ORAL | Status: DC
  Filled 2014-05-31: qty 1

## 2014-05-31 MED ORDER — FUROSEMIDE 10 MG/ML IJ SOLN
40.0000 mg | Freq: Two times a day (BID) | INTRAMUSCULAR | Status: DC
  Administered 2014-06-01: 40 mg via INTRAVENOUS
  Filled 2014-05-31: qty 4

## 2014-05-31 MED ORDER — ACETAMINOPHEN 650 MG RE SUPP
650.0000 mg | Freq: Four times a day (QID) | RECTAL | Status: DC | PRN
  Filled 2014-05-31: qty 1

## 2014-05-31 MED ORDER — ESCITALOPRAM OXALATE 10 MG PO TABS
10.0000 mg | ORAL_TABLET | Freq: Every day | ORAL | Status: DC
  Administered 2014-06-01: 10 mg via ORAL
  Filled 2014-05-31: qty 1

## 2014-05-31 MED ORDER — LOSARTAN POTASSIUM 25 MG PO TABS
25.0000 mg | ORAL_TABLET | Freq: Every day | ORAL | Status: DC
  Administered 2014-06-01: 25 mg via ORAL
  Filled 2014-05-31: qty 1

## 2014-05-31 MED ORDER — FUROSEMIDE 10 MG/ML IJ SOLN
40.0000 mg | Freq: Once | INTRAMUSCULAR | Status: AC
  Administered 2014-05-31: 40 mg via INTRAVENOUS

## 2014-05-31 MED ORDER — POLYETHYLENE GLYCOL 3350 17 G PO PACK
17.0000 g | PACK | Freq: Every day | ORAL | Status: DC | PRN
  Administered 2014-06-01 – 2014-06-04 (×2): 17 g via ORAL
  Filled 2014-05-31 (×2): qty 1

## 2014-05-31 MED ORDER — ATENOLOL 25 MG PO TABS
25.0000 mg | ORAL_TABLET | Freq: Two times a day (BID) | ORAL | Status: DC
  Administered 2014-05-31 – 2014-06-04 (×7): 25 mg via ORAL
  Filled 2014-05-31 (×7): qty 1

## 2014-05-31 MED ORDER — ATORVASTATIN CALCIUM 20 MG PO TABS
20.0000 mg | ORAL_TABLET | Freq: Every day | ORAL | Status: DC
  Administered 2014-05-31 – 2014-06-03 (×4): 20 mg via ORAL
  Filled 2014-05-31 (×5): qty 1

## 2014-05-31 MED ORDER — FUROSEMIDE 10 MG/ML IJ SOLN
INTRAMUSCULAR | Status: AC
  Administered 2014-05-31: 40 mg via INTRAVENOUS
  Filled 2014-05-31: qty 4

## 2014-05-31 NOTE — Progress Notes (Signed)
ANTICOAGULATION CONSULT NOTE - Initial Consult  Pharmacy Consult for Coumadin Indication: atrial fibrillation  Allergies  Allergen Reactions  . Amoxicillin Other (See Comments)    Reaction:  Weakness  . Digoxin Other (See Comments)    Reaction:  Weakness and dehydration  . Risedronate Sodium Rash    Patient Measurements: Height: 5\' 3"  (160 cm) Weight: 98 lb (44.453 kg) IBW/kg (Calculated) : 52.4   Vital Signs: Temp: 97.7 F (36.5 C) (05/04 1714) Temp Source: Oral (05/04 1714) BP: 155/72 mmHg (05/04 1714) Pulse Rate: 74 (05/04 1714)  Labs:  Recent Labs  05/30/14 05/31/14 1255  HGB  --  13.3  HCT  --  40.3  PLT  --  215  APTT  --  35  LABPROT  --  32.5*  INR 3.9* 3.16  CREATININE  --  0.57  TROPONINI  --  <0.03    Estimated Creatinine Clearance: 33.5 mL/min (by C-G formula based on Cr of 0.57).   Medical History: Past Medical History  Diagnosis Date  . Permanent atrial fibrillation     a. Chronic Coumadin.  [name]. HTN (hypertension)   . Chronic diastolic CHF (congestive heart failure), NYHA class 1     a. 04/2013 Echo: EF 50-55%, triv MR, mildly dil LA, PASP 48mmHg, mild TR, mild-mod RA dil.  [name]. CAD (coronary artery disease)     a. 2005 Cath: 100% dLCX-->Med managed.  . Sick sinus syndrome     a. 05/2008 s/p MDT EnRhythm DC PPM (Allred).  . Chronic pain     a. ? due to arthritis  . Glaucoma   . Hyperlipidemia   . Diverticulitis large intestine   . Allergy   . Phlebitis     a. after pacemeker placement in 2010.  [name]. Colon polyps   . Hypothyroidism   . Transfusion history     a. After childbirth 60 years ago (1957)  . Rash/skin eruption     a. Multiple episodes - wide distribution-intermittent, possibly drug rash.  . Pulmonary HTN     a. PASP 48mmHg on echo 04/2013.  [name]. Chronic Right Pleural Effusion     a. 07/2012 s/p thoracentesis - [facility].    Medications:  Scheduled:  . atenolol  25 mg Oral BID  . atorvastatin  20  mg Oral QHS  . calcium-vitamin D  1 tablet Oral Daily  . diltiazem  180 mg Oral Daily  . dorzolamide-timolol  1 drop Both Eyes BID  . escitalopram  10 mg Oral Daily  . furosemide  40 mg Intravenous Q12H  . [START ON 06/01/2014] levothyroxine  125 mcg Oral QAC breakfast  . losartan  25 mg Oral Daily  . potassium chloride  10 mEq Oral BID  . sodium chloride  3 mL Intravenous Q12H  . Travoprost (BAK Free)  1 drop Both Eyes Daily  . Vitamin D (Ergocalciferol)  50,000 Units Oral Weekly    Assessment: Chronic Coumadin for afib s/p pacemaker admitted with SOB. PTA dose unclear but 5 mg tablets. Goal of Therapy:  INR 2-3    Plan:  INR is elevated on outpatient dosing with last dose 05/03. Will f/u AM INR and resume Coumadin at lower dose as indicated.   Laurie Larsen, Grae Cannata D 05/31/2014,5:35 PM

## 2014-05-31 NOTE — ED Provider Notes (Signed)
[facility] Emergency Department Provider Note    ____________________________________________  Time seen: on arrival  I have reviewed the triage vital signs and the nursing notes.   HISTORY  Chief Complaint Shortness of Breath       HPI Laurie Larsen is a 81 y.o. female with history of CHF, wears 2.5 L home oxygen nightly, hypertension, atrial fibrillation on Coumadin, status post pacemaker presents for evaluation of shortness of breath more since yesterday. She reports that the shortness of breath has been gradual in onset. Waxing and waning in severity. Current severity 8 of 10. She reports that the shortness of breath is worse with lying flat. She reports that she typically takes Lasix but missed her dose yesterday. Slightly worse with exertion. She has had cough, no fevers, no chest pain     Past Medical History  Diagnosis Date  . Permanent atrial fibrillation     a. Chronic Coumadin.  [name]. HTN (hypertension)   . Chronic diastolic CHF (congestive heart failure), NYHA class 1     a. 04/2013 Echo: EF 50-55%, triv MR, mildly dil LA, PASP 48mmHg, mild TR, mild-mod RA dil.  [name]. CAD (coronary artery disease)     a. 2005 Cath: 100% dLCX-->Med managed.  . Sick sinus syndrome     a. 05/2008 s/p MDT EnRhythm DC PPM (Allred).  . Chronic pain     a. ? due to arthritis  . Glaucoma   . Hyperlipidemia   . Diverticulitis large intestine   . Allergy   . Phlebitis     a. after pacemeker placement in 2010.  [name]. Colon polyps   . Hypothyroidism   . Transfusion history     a. After childbirth 60 years ago (1957)  . Rash/skin eruption     a. Multiple episodes - wide distribution-intermittent, possibly drug rash.  . Pulmonary HTN     a. PASP 48mmHg on echo 04/2013.  [name]. Chronic Right Pleural Effusion     a. 07/2012 s/p thoracentesis - [facility].    Patient Active Problem List   Diagnosis Date Noted  . Cardiac pacemaker in situ  03/10/2014  . HLD (hyperlipidemia) 03/10/2014  . Pulmonary HTN 06/09/2013  . Shortness of breath 04/20/2013  . Depression 06/23/2011  . Edema 02/26/2011  . Diastolic CHF, chronic 11/13/2010  . Leg ulcer 11/06/2010  . Pleural effusion 10/01/2010  . Chronic anticoagulation 07/10/2010  . Hypothyroidism 04/24/2010  . Essential hypertension, benign 03/01/2010  . ATRIAL FIBRILLATION, CHRONIC 03/01/2010    Past Surgical History  Procedure Laterality Date  . Appendectomy    . Abdominal hysterectomy    . Tonsillectomy    . Kidney surgery      Right  . Ankle fracture surgery      Left, pin  . Pacemaker insertion  06/20/08    MDT EnRhythm DR implanted by Dr Reyes IvanKersey  . Breast surgery      Needle guided excision, right breast calcification  . Breast fibroadenoma surgery  2010    Right  . Skin graft      left leg    Current Outpatient Rx  Name  Route  Sig  Dispense  Refill  . Acetaminophen (TYLENOL EX ST ARTHRITIS PAIN PO)   Oral   Take 1 tablet by mouth daily as needed. For Pain.         [name]. atenolol (TENORMIN) 50 MG tablet      TAKE 1/2 TABLET BY MOUTH TWICE  DAILY   90 tablet   1   . atorvastatin (LIPITOR) 40 MG tablet   Oral   Take 0.5 tablets (20 mg total) by mouth at bedtime.   45 tablet   3   . Calcium-Vitamin D 600-200 MG-UNIT per tablet   Oral   Take 1 tablet by mouth 2 (two) times daily. Pt states she only take this occasionally (04/12/14)         . diltiazem (CARDIZEM CD) 180 MG 24 hr capsule      TAKE 1 CAPSULE BY MOUTH DAILY   90 capsule   1   . dorzolamide-timolol (COSOPT) 22.3-6.8 MG/ML ophthalmic solution   Both Eyes   Place 1 drop into both eyes 2 (two) times daily.          [name]. escitalopram (LEXAPRO) 10 MG tablet   Oral   Take 10 mg by mouth daily.         . furosemide (LASIX) 80 MG tablet      Take 1 tablet by mouth daily, may take an extra 1/2 tablet (40 mg) daily as needed for sweling         . HYDROcodone-acetaminophen (NORCO) 10-325  MG per tablet   Oral   Take 1 tablet by mouth every 6 (six) hours as needed for moderate pain or severe pain.         [name]. losartan (COZAAR) 100 MG tablet   Oral   Take 50 mg by mouth at bedtime.         . Multiple Vitamins-Minerals (ICAPS AREDS FORMULA PO)   Oral   Take 1 capsule by mouth 2 (two) times daily.          . NON FORMULARY   Oral   Take 12,000 Units by mouth once a week. Pt unsure of medication name (04/12/14)         . NON FORMULARY   Nasal   Place 2.5 L into the nose at bedtime.          . polyethylene glycol (MIRALAX / GLYCOLAX) packet   Oral   Take 17 g by mouth daily as needed for mild constipation.         . potassium chloride (K-DUR,KLOR-CON) 10 MEQ tablet   Oral   Take 1 tablet (10 mEq total) by mouth 2 (two) times daily.   180 tablet   6   . SYNTHROID 125 MCG tablet      TAKE 1 TABLET BY MOUTH DAILY   90 tablet   1     Dispense as written.   . traMADol (ULTRAM) 50 MG tablet   Oral   Take 50 mg by mouth every 6 (six) hours as needed for moderate pain.         [name]. travoprost, benzalkonium, (TRAVATAN) 0.004 % ophthalmic solution   Both Eyes   Place 1 drop into both eyes daily.          . Vitamin D, Ergocalciferol, (DRISDOL) 50000 UNITS CAPS capsule   Oral   Take 50,000 Units by mouth once a week.       5   . warfarin (COUMADIN) 5 MG tablet      TAKE AS DIRECTED BY COUMADIN CLINIC   40 tablet   3     30-day supply     Allergies Amoxicillin; Digoxin; and Risedronate sodium  Family History  Problem Relation Age of Onset  . Cancer Mother     colon with mets  .  Diabetes Mother   . Heart disease Father   . Hyperlipidemia Father   . Hypertension Father   . Stroke Brother     brother who died Jun 28, 2022  . Cancer Paternal Aunt     breast  . Cancer Cousin     uterine, lung cancer  . Heart attack Brother   . Heart attack Father     Social History History  Substance Use Topics  . Smoking status: Never Smoker   .  Smokeless tobacco: Never Used  . Alcohol Use: No    Review of Systems  Constitutional: Negative for fever. Eyes: Negative for visual changes. ENT: Negative for sore throat. Cardiovascular: Negative for chest pain. Respiratory: Positive for shortness of breath. Gastrointestinal: Negative for abdominal pain, vomiting and diarrhea. Genitourinary: Negative for dysuria. Musculoskeletal: Negative for back pain. Skin: Negative for rash. Neurological: Negative for headaches, focal weakness or numbness.  10-point ROS otherwise negative.  ____________________________________________   PHYSICAL EXAM:  VITAL SIGNS: ED Triage Vitals  Enc Vitals Group     BP 05/31/14 1218 180/113 mmHg     Pulse Rate 05/31/14 1218 81     Resp --      Temp 05/31/14 1218 98.2 F (36.8 C)     Temp Source 05/31/14 1218 Oral     SpO2 05/31/14 1218 93 %     Weight 05/31/14 1218 98 lb (44.453 kg)     Height 05/31/14 1218  (1.6 m)     Head Cir --      Peak Flow --      Pain Score --      Pain Loc --      Pain Edu? --      Excl. in GC? --      Constitutional: Alert and oriented. Nontoxic-appearing but fatigues Eyes: Conjunctivae are normal. PERRL. Normal extraocular movements. ENT   Head: Normocephalic and atraumatic.   Nose: No congestion/rhinnorhea.   Mouth/Throat: Mucous membranes are moist.   Neck: No stridor. Hematological/Lymphatic/Immunilogical: No cervical lymphadenopathy. Cardiovascular: Normal rate, regular rhythm. Normal and symmetric distal pulses are present in all extremities. No murmurs, rubs, or gallops. Respiratory: Mild tachypnea, diminished breath sounds bilateral bases with crackles Gastrointestinal: Soft and nontender. No distention. No abdominal bruits. There is no CVA tenderness. Genitourinary: deferred Musculoskeletal: Nontender with normal range of motion in all extremities. No joint effusions.  No lower extremity tenderness nor edema. Neurologic:  Normal  speech and language. No gross focal neurologic deficits are appreciated. Speech is normal. No gait instability. Skin:  Skin is warm, dry and intact. No rash noted. Psychiatric: Mood and affect are normal. Speech and behavior are normal. Patient exhibits appropriate insight and judgment.  ____________________________________________    LABS (pertinent positives/negatives)  BNP elevated at 1124, labs otherwise generally unremarkable  ____________________________________________   EKG  ED ECG REPORT   Date: 05/31/2014  EKG Time: 12:33  Rate: 77  Rhythm: normal EKG, normal sinus rhythm, unchanged from previous tracings, atrial fibrillation, rate 77  Axis: normal  Intervals:occasional ventricular-paced beats  ST&T Change:    ____________________________________________    RADIOLOGY CXR: IMPRESSION: Up to moderate bilateral pleural effusions appear stable compared to 2015. No overt pulmonary edema.  ____________________________________________   PROCEDURES  Procedure(s) performed: None  Critical Care performed: None ____________________________________________   INITIAL IMPRESSION / ASSESSMENT AND PLAN / ED COURSE  Pertinent labs & imaging results that were available during my care of the patient were reviewed by me and considered in my medical decision making (  see chart for details).  WATEEN VARON is a 81 y.o. female with history of CHF, wears 2.5 L home oxygen nightly, hypertension, atrial fibrillation on Coumadin, status post pacemaker presents for evaluation of shortness of breath more since yesterday. On exam, she has moderate dyspnea and mild increased work of breathing, poor air movement in the bases with crackles concerning for CHF exacerbation/volume overload. Currently requiring 5 L of oxygen supplementally to maintain O2 sat 93%. We'll give IV Lasix. Hospitalist will admit for CHF exacerbation. Troponin negative. INR elevated at 3.9 yesterday, repeat  pending. ____________________________________________   FINAL CLINICAL IMPRESSION(S) / ED DIAGNOSES  Final diagnoses:  Acute exacerbation of CHF (congestive heart failure)      Gayla Doss, MD 05/31/14 1432

## 2014-05-31 NOTE — H&P (Signed)
[facility] [place] [facility]   PATIENT NAME: Laurie Larsen    MR#:  [id number]  DATE OF BIRTH:  1923-12-04  DATE OF ADMISSION:  05/31/2014  PRIMARY CARE PHYSICIAN: [name], MD   REQUESTING/REFERRING PHYSICIAN: Dr. Inocencio Homes  CHIEF COMPLAINT:    HISTORY OF PRESENT ILLNESS:  Laurie Larsen  is a 81 y.o. female who lives in [place] at [facility] home with a known history of diastolic congestive heart failure, seen by Dr. Lewie Loron  in the past is presenting to the ED with a chief complaint of shortness of breath. Patient usually lives 2.5 L of home oxygen but today morning she was short of breath and brought into the ED. Patient was found to be hypoxic and she was placed on 5 L of oxygen via nasal cannula. Patient reports that she missed her home dose of Lasix yesterday 2 times. Denies any chest pain but shortness of breath has been gradually getting worse. Patient was given 40 mg IV Lasix in the ED and subsequently she was feeling better. Denies any lower extremity swelling. No other complaints. Denies any fever, cough and chest tightness  PAST MEDICAL HISTORY:   Past Medical History  Diagnosis Date  . Permanent atrial fibrillation     a. Chronic Coumadin.  [name] HTN (hypertension)   . Chronic diastolic CHF (congestive heart failure), NYHA class 1     a. 04/2013 Echo: EF 50-55%, triv MR, mildly dil LA, PASP , mild TR, mild-mod RA dil.  [name] CAD (coronary artery disease)     a. 2005 Cath: 100% dLCX-->Med managed.  . Sick sinus syndrome     a. 05/2008 s/p MDT EnRhythm DC PPM (Allred).  . Chronic pain     a. ? due to arthritis  . Glaucoma   . Hyperlipidemia   . Diverticulitis large intestine   . Allergy   . Phlebitis     a. after pacemeker placement in 2010.  [name] Colon polyps   . Hypothyroidism   . Transfusion history     a. After childbirth 60 years ago (1957)  . Rash/skin eruption     a. Multiple episodes - wide distribution-intermittent,  possibly drug rash.  . Pulmonary HTN     a. PASP on echo 04/2013.  [name] Chronic Right Pleural Effusion     a. 07/2012 s/p thoracentesis - Followed by Dr. Delford Field [facility].    PAST SURGICAL HISTOIRY:   Past Surgical History  Procedure Laterality Date  . Appendectomy    . Abdominal hysterectomy    . Tonsillectomy    . Kidney surgery      Right  . Ankle fracture surgery      Left, pin  . Pacemaker insertion  06/20/08    MDT EnRhythm DR implanted by Dr Reyes Ivan  . Breast surgery      Needle guided excision, right breast calcification  . Breast fibroadenoma surgery  2010    Right  . Skin graft      left leg    SOCIAL HISTORY:   History  Substance Use Topics  . Smoking status: Never Smoker   . Smokeless tobacco: Never Used  . Alcohol Use: No    FAMILY HISTORY:   Family History  Problem Relation Age of Onset  . Cancer Mother     colon with mets  . Diabetes Mother   . Heart disease Father   . Hyperlipidemia Father   . Hypertension Father   . Stroke  Brother     brother who died '11  . Cancer Paternal Aunt     breast  . Cancer Cousin     uterine, lung cancer  . Heart attack Brother   . Heart attack Father     DRUG ALLERGIES:   Allergies  Allergen Reactions  . Amoxicillin Other (See Comments)    Reaction:  Weakness  . Digoxin Other (See Comments)    Reaction:  Weakness and dehydration  . Risedronate Sodium Rash    REVIEW OF SYSTEMS:  CONSTITUTIONAL: No fever, fatigue , complaining of generalized weakness.  EYES: No blurred or double vision.  EARS, NOSE, AND THROAT: No tinnitus or ear pain.  RESPIRATORY: No cough, shortness of breath, wheezing or hemoptysis.  CARDIOVASCULAR:Reporting shortness of breath with minimal exertion.  No chest pain, orthopnea, edema.  GASTROINTESTINAL: No nausea, vomiting, diarrhea or abdominal pain.  GENITOURINARY: No dysuria, hematuria.  ENDOCRINE: No polyuria, nocturia,  HEMATOLOGY: No anemia, easy bruising or  bleeding SKIN: No rash or lesion. MUSCULOSKELETAL: No joint pain or arthritis.   NEUROLOGIC: No tingling, numbness, weakness.  PSYCHIATRY: No anxiety or depression.   MEDICATIONS AT HOME:   Prior to Admission medications   Medication Sig Start Date End Date Taking? Authorizing Provider  atenolol (TENORMIN) 25 MG tablet Take 25 mg by mouth 2 (two) times daily.   Yes Historical Provider, MD  atorvastatin (LIPITOR) 40 MG tablet Take 0.5 tablets (20 mg total) by mouth at bedtime. 03/13/14  Yes Rosalio MacadamiaLori C Gerhardt, NP  calcium-vitamin D (OSCAL WITH D) 500-200 MG-UNIT per tablet Take 1 tablet by mouth daily.   Yes Historical Provider, MD  diltiazem (CARDIZEM CD) 180 MG 24 hr capsule TAKE 1 CAPSULE BY MOUTH DAILY 11/30/13  Yes Peter M SwazilandJordan, MD  dorzolamide-timolol (COSOPT) 22.3-6.8 MG/ML ophthalmic solution Place 1 drop into both eyes 2 (two) times daily.    Yes Historical Provider, MD  escitalopram (LEXAPRO) 10 MG tablet Take 10 mg by mouth daily.   Yes Historical Provider, MD  furosemide (LASIX) 80 MG tablet Take 40 mg by mouth 2 (two) times daily.    Yes Historical Provider, MD  HYDROcodone-acetaminophen (NORCO) 10-325 MG per tablet Take 1 tablet by mouth 4 (four) times daily as needed for severe pain.    Yes Historical Provider, MD  losartan (COZAAR) 25 MG tablet Take 25 mg by mouth daily.   Yes Historical Provider, MD  polyethylene glycol (MIRALAX / GLYCOLAX) packet Take 17 g by mouth daily as needed for mild constipation.   Yes Historical Provider, MD  potassium chloride (K-DUR,KLOR-CON) 10 MEQ tablet Take 1 tablet (10 mEq total) by mouth 2 (two) times daily. 06/09/13  Yes Peter M SwazilandJordan, MD  SYNTHROID 125 MCG tablet TAKE 1 TABLET BY MOUTH DAILY 08/04/11  Yes Romero BellingSean Ellison, MD  traMADol (ULTRAM) 50 MG tablet Take 50 mg by mouth 3 (three) times daily as needed for moderate pain.    Yes Historical Provider, MD  travoprost, benzalkonium, (TRAVATAN) 0.004 % ophthalmic solution Place 1 drop into both eyes  daily.    Yes Historical Provider, MD  Vitamin D, Ergocalciferol, (DRISDOL) 50000 UNITS CAPS capsule Take 50,000 Units by mouth once a week.  03/02/14  Yes Historical Provider, MD  warfarin (COUMADIN) 5 MG tablet TAKE AS DIRECTED BY COUMADIN CLINIC 12/19/13  Yes Peter M SwazilandJordan, MD  atenolol (TENORMIN) 50 MG tablet TAKE 1/2 TABLET BY MOUTH TWICE DAILY Patient not taking: Reported on 05/31/2014 12/08/13   Peter M SwazilandJordan, MD  VITAL SIGNS:  Blood pressure 166/89, pulse 82, temperature 98.2 F (36.8 C), temperature source Oral, resp. rate 23, height  (1.6 m), weight 44.453 kg (98 lb), SpO2 90 %.  PHYSICAL EXAMINATION:  GENERAL:  81 y.o.-year-old patient lying in the bed with no acute distress.  EYES: Pupils equal, round, reactive to light and accommodation. No scleral icterus. Extraocular muscles intact.  HEENT: Head atraumatic, normocephalic. Oropharynx and nasopharynx clear.  NECK:  Supple, no jugular venous distention. No thyroid enlargement, no tenderness.  LUNGS: Normal breath sounds bilaterally, no wheezing,positive rales and rhonchi . No use of accessory muscles of respiration.  CARDIOVASCULAR: Irregularly irregular. No murmurs, rubs, or gallops. Pacemaker site is intact on the left side of the anterior chest wall.  ABDOMEN: Soft, nontender, nondistended. Bowel sounds present. No organomegaly or mass.  EXTREMITIES: No pedal edema, cyanosis, or clubbing.  NEUROLOGIC: Cranial nerves II through XII are intact. Muscle strength 5/5 in all extremities. Sensation intact. Gait not checked.  PSYCHIATRIC: The patient is alert and oriented x 3.  SKIN: No obvious rash, lesion, or ulcer.   LABORATORY PANEL:   CBC  Recent Labs Lab 05/31/14 1255  WBC 8.5  HGB 13.3  HCT 40.3  PLT 215   ------------------------------------------------------------------------------------------------------------------  Chemistries   Recent Labs Lab 05/31/14 1255  NA 139  K 3.5  CL 106  CO2 25   GLUCOSE 109*  BUN 19  CREATININE 0.57  CALCIUM 9.0  AST 30  ALT 23  ALKPHOS 113  BILITOT 0.8   ------------------------------------------------------------------------------------------------------------------  Cardiac Enzymes  Recent Labs Lab 05/31/14 1255  TROPONINI <0.03   ------------------------------------------------------------------------------------------------------------------  RADIOLOGY:  Dg Chest Port 1 View  05/31/2014   CLINICAL DATA:  81 year old female with shortness of breath and weakness. Initial encounter.  EXAM: PORTABLE CHEST - 1 VIEW  COMPARISON:  11/06/2013 and earlier.  FINDINGS: Portable AP semi upright view at 1341 hours. Continued bilateral pleural effusions greater on the right, up to moderate. Chronic left chest cardiac pacemaker. Stable cardiomegaly and mediastinal contours. Pulmonary vascularity also appears stable without overt edema. No pneumothorax. No new pulmonary opacity.  IMPRESSION: Up to moderate bilateral pleural effusions appear stable compared to 2015. No overt pulmonary edema.   Electronically Signed   By: Odessa Fleming M.D.   On: 05/31/2014 14:10    EKG:   Orders placed or performed during the hospital encounter of 05/31/14  . ED EKG  . ED EKG    IMPRESSION AND PLAN:   #1 acute respiratory distress with hypoxia secondary to acute on chronic diastolic CHF-this is secondary to missing her Lasix dose yesterday- Will admit the patient to telemetry. Monitor daily weights. Monitor intake and output. Continue Lasix 40 mg IV every 12 hours. Cardiac consult is placed in Dr. Windell Hummingbird group. Will obtain echocardiogram. #2 history of chronic atrial fibrillation status post defibrillator. On Coumadin. INR is as supratherapeutic today. Will hold Coumadin and monitor daily PT INRs. Consult pharmacy for Coumadin management. #3 hypothyroidism-continue Synthroid #4 Hypertension continue her home medication Cardizem and atenolol and up titrate medications  as needed basis. #5 hyperlipidemia-check fasting lipid panel in a.m. Continue home medication atorvastatin. #6 chronic history of glaucoma. Continue her home medication Onalee Hua reflexes is not needed as the patient is on Coumadin and INR is supratherapeutic She is DO NOT RESUSCITATE, son Mr. Fredrik Cove is the healthcare medical power of attorney Plan of care discussed in detail with the patient and her daughter-in-law at bedside. She verbalized understanding of the plan. Patient will  be transferred to her primary care physician Dr.Hande's service in the evening    All the records are reviewed and case discussed with ED provider. Management plans discussed with the patient, family and they are in agreement.  CODE STATUS: DO NOT RESUSCITATE  TOTAL TIME TAKING CARE OF THIS PATIENT: 50  minutes.    Ramonita LabGouru, Tammy Ericsson M.D on 05/31/2014 at 3:31 PM  Between 7am to 6pm - Pager - [phone]  After 6pm go to [email or web address] - password [email or web address] [facility]  [facility]  Office  [phone]  CC: Primary care physician; Barbette ReichmannHANDE,VISHWANATH, MD

## 2014-05-31 NOTE — Progress Notes (Signed)
New admit from ED, family at bedside, pt came in for symptoms of heart failure after skipping Lasix dose on 05/30/14, pt is up to bsc with one person assist, on 2 l oxygen acutely, bed alarm activated, patient reports taking home medicines this morning prior to coming to hospital

## 2014-05-31 NOTE — ED Notes (Signed)
Pt reports feeling short of breath for the past week and increasing shortness of breath today.  Occurs with or without ambulation.  She wears oxygen 2.5L at night only.  She has a history of CHF and is on Lasix PO.  She did not take a dose of Lasix yesterday because she was out a lot. [facility] [place].

## 2014-06-01 ENCOUNTER — Inpatient Hospital Stay (HOSPITAL_COMMUNITY): Admit: 2014-06-01 | Payer: Medicare Other

## 2014-06-01 DIAGNOSIS — I5033 Acute on chronic diastolic (congestive) heart failure: Secondary | ICD-10-CM

## 2014-06-01 DIAGNOSIS — J9601 Acute respiratory failure with hypoxia: Secondary | ICD-10-CM

## 2014-06-01 DIAGNOSIS — E43 Unspecified severe protein-calorie malnutrition: Secondary | ICD-10-CM | POA: Diagnosis present

## 2014-06-01 DIAGNOSIS — I340 Nonrheumatic mitral (valve) insufficiency: Secondary | ICD-10-CM

## 2014-06-01 DIAGNOSIS — I482 Chronic atrial fibrillation: Secondary | ICD-10-CM

## 2014-06-01 LAB — TROPONIN I

## 2014-06-01 LAB — LIPID PANEL
Cholesterol: 117 mg/dL (ref 0–200)
HDL: 43 mg/dL (ref >40)
LDL CALC: 60 mg/dL (ref 0–99)
Total CHOL/HDL Ratio: 2.7 RATIO
Triglycerides: 68 mg/dL (ref <150)
VLDL: 14 mg/dL (ref 0–40)

## 2014-06-01 LAB — PROTIME-INR
INR: 2.81
PROTHROMBIN TIME: 29.7 s — AB (ref 11.4–15.0)

## 2014-06-01 MED ORDER — ESCITALOPRAM OXALATE 10 MG PO TABS
10.0000 mg | ORAL_TABLET | Freq: Every day | ORAL | Status: DC
  Administered 2014-06-02 – 2014-06-03 (×2): 10 mg via ORAL
  Filled 2014-06-01 (×2): qty 1

## 2014-06-01 MED ORDER — LOSARTAN POTASSIUM 25 MG PO TABS
25.0000 mg | ORAL_TABLET | Freq: Every day | ORAL | Status: DC
  Administered 2014-06-02: 25 mg via ORAL
  Filled 2014-06-01: qty 1

## 2014-06-01 MED ORDER — FUROSEMIDE 10 MG/ML IJ SOLN
40.0000 mg | Freq: Three times a day (TID) | INTRAMUSCULAR | Status: DC
  Administered 2014-06-01 – 2014-06-03 (×6): 40 mg via INTRAVENOUS
  Filled 2014-06-01 (×7): qty 4

## 2014-06-01 MED ORDER — POTASSIUM CHLORIDE CRYS ER 20 MEQ PO TBCR
40.0000 meq | EXTENDED_RELEASE_TABLET | Freq: Two times a day (BID) | ORAL | Status: DC
  Administered 2014-06-01 – 2014-06-03 (×5): 40 meq via ORAL
  Filled 2014-06-01 (×6): qty 2

## 2014-06-01 MED ORDER — WARFARIN SODIUM 1 MG PO TABS
4.0000 mg | ORAL_TABLET | Freq: Every day | ORAL | Status: DC
  Administered 2014-06-01 – 2014-06-04 (×4): 4 mg via ORAL
  Filled 2014-06-01 (×4): qty 4

## 2014-06-01 MED ORDER — TRAVOPROST (BAK FREE) 0.004 % OP SOLN
1.0000 [drp] | Freq: Every day | OPHTHALMIC | Status: DC
  Administered 2014-06-02 – 2014-06-03 (×2): 1 [drp] via OPHTHALMIC
  Filled 2014-06-01: qty 2.5

## 2014-06-01 NOTE — Progress Notes (Signed)
ANTICOAGULATION CONSULT NOTE - Follow Up Consult  Pharmacy Consult for warfarin Indication: atrial fibrillation  Allergies  Allergen Reactions  . Amoxicillin Other (See Comments)    Reaction:  Weakness  . Digoxin Other (See Comments)    Reaction:  Weakness and dehydration  . Risedronate Sodium Rash    Patient Measurements: Height: 5\' 3"  (160 cm) Weight: 97 lb 6.4 oz (44.18 kg) IBW/kg (Calculated) : 52.4   Vital Signs: Temp: 97.4 F (36.3 C) (05/05 0404) Temp Source: Oral (05/05 0404) BP: 121/51 mmHg (05/05 0404) Pulse Rate: 69 (05/05 0404)  Labs:  Recent Labs  05/30/14 05/31/14 1255 05/31/14 1728 06/01/14 0022 06/01/14 0438  HGB  --  13.3  --   --   --   HCT  --  40.3  --   --   --   PLT  --  215  --   --   --   APTT  --  35  --   --   --   LABPROT  --  32.5*  --   --  29.7*  INR 3.9* 3.16  --   --  2.81  CREATININE  --  0.57  --   --   --   TROPONINI  --  <0.03 0.03 <0.03  --     Estimated Creatinine Clearance: 33.3 mL/min (by C-G formula based on Cr of 0.57).   Medications:  Scheduled:  . atenolol  25 mg Oral BID  . atorvastatin  20 mg Oral QHS  . calcium-vitamin D  1 tablet Oral Daily  . diltiazem  180 mg Oral Daily  . dorzolamide-timolol  1 drop Both Eyes BID  . escitalopram  10 mg Oral Daily  . feeding supplement (ENSURE ENLIVE)  237 mL Oral BID BM  . furosemide  40 mg Intravenous Q12H  . levothyroxine  125 mcg Oral QAC breakfast  . losartan  25 mg Oral Daily  . potassium chloride  10 mEq Oral BID  . sodium chloride  3 mL Intravenous Q12H  . Travoprost (BAK Free)  1 drop Both Eyes Daily  . Vitamin D (Ergocalciferol)  50,000 Units Oral Weekly  . warfarin  4 mg Oral Daily    Assessment: Patient is an 81 yo female admitted for shortness of breath and found to be hypoxic.  Patient with atrial fibrillation on chronic warfarin therapy.  Per Med Rec, patient takes warfarin 5 mg po daily and holds doses as directed by her physician.    INR  History: 5/4 - 3.16, no warfarin given 5/5 - 2.8  Goal of Therapy:  INR 2-3 Monitor INR daily and hemoglobin every 4 days while receiving warfarin.   Plan:  INR is within desired range of 2-3.  Will resume warfarin at lower dose of 4 mg daily.  This is ~15% reduction in total weekly dose.  Will recheck INR with AM labs.   Clarisa Schoolsrystal Lovett Coffin, PharmD Clinical Pharmacist 06/01/2014

## 2014-06-01 NOTE — Progress Notes (Addendum)
Standby assist to bathroom.

## 2014-06-01 NOTE — Consult Note (Addendum)
Cardiology Consultation Note  Patient ID: Laurie Larsen, MRN: [id number], DOB/AGE: 09/25/1923 81 y.o. Admit date: 05/31/2014   Date of Consult: 06/01/2014 Primary Physician: [name], MD Primary Cardiologist: Dr. Mariah Milling, MD  Chief Complaint: SOB x 1 week Reason for Consult: Respiratory distress 2/2 acute on chronic diastolic CHF  HPI: 81 year old female with history of chronic diastolic heart failure and chronic right-sided pleural effusion s/p thoracentesis in July 2014 in the setting of PNA (pathology suggestive of transudative process with some reactive mesothelial cells), HLD, pulmonary HTN, chronic atrial fib on chronic Coumadin therapy, HTN, CAD with known occlusion of a distal LCx branch from 2005, SSS with MDT PPM in place (followed by Dr. Johney Frame), chronic pain, and hypothyroidism who presented to [facility] on 5/4 with 1 week history of increased dyspnea.   She has known CAD with known occlusion of the dLCx that was medically managed at that time. No further ischemic evaluations since. Last echo done in April 2015 showed normal LV and valvular function with moderate pulmonary HTN with PASP of 48 mm Hg. She was admitted to [facility] in August 2015 with exacerbation of diastolic CHF in the setting of missing her Lasix and dietary indiscretions. She was placed on IV Lasix with slow but steady improvement. Coumadin was held initially as it was thought she may need repeat right-sided thoracentesis, however with diuresis, she had symptomatic improvement and follow up CXR also showed improvement. As a result she was discharged not requiring repeat thoracentesis. She was seen x 2 in the [facility] office for acute on chronic diastolic CHF exacerbations in the setting of dietary indiscretions (she loves cheese). She was diuresed as an outpatient and had improved breathing per her husband. Her husband believes she takes Lasix 40 mg daily at home. She currently lives at [facility].    She presented  to [facility] on 5/4 with a 1 week history of increased SOB in the setting of increased missed Lasix dosages, at least over the later half of the week, including 5/4. Weight is down from March office visit with Dr. Johney Frame. She has been having to use 3 pillows overnight for the past 1 week, baseline is 1 pillow. No early satiety. Fluid intake has remained the same, per her husband she drinks "plenty of fluids." They are unable to quantify the amount. She is on 2L oxygen at home at baseline. She has not required any extra. She has not had any LEE. She also notes right-sided back pain x 6 months after working in the yard. No radiation. No associated symptoms.   Upon her arrival to [facility] she was found to be hypoxic with pulse ox of 93% on 2.5L via [place]. She is in chronic a-fib and had a pulse of 81. Labs showed a BNP of 1124, troponin negative x 1, SCr 0.57, K+ 3.5, INR 2.87. She was started on IV Lasix 40 mg q 12 hours. She did not sleep well the prior night and would like to go home as soon as possible.    Past Medical History  Diagnosis Date  . Permanent atrial fibrillation     a. Chronic Coumadin.  [name] HTN (hypertension)   . Chronic diastolic CHF (congestive heart failure), NYHA class 1     a. 04/2013 Echo: EF 50-55%, triv MR, mildly dil LA, PASP , mild TR, mild-mod RA dil.  [name] CAD (coronary artery disease)     a. 2005 Cath: 100% dLCX-->Med managed.  . Sick sinus syndrome  a. 05/2008 s/p MDT EnRhythm DC PPM ([facility]).  . Chronic pain     a. ? due to arthritis  . Glaucoma   . Hyperlipidemia   . Diverticulitis large intestine   . Allergy   . Phlebitis     a. after pacemeker placement in 2010.  [name] Colon polyps   . Hypothyroidism   . Transfusion history     a. After childbirth 60 years ago (1957)  . Rash/skin eruption     a. Multiple episodes - wide distribution-intermittent, possibly drug rash.  . Pulmonary HTN     a. PASP on echo 04/2013.  [name] Chronic Right Pleural Effusion     a. 07/2012  s/p thoracentesis - Followed by Dr. Delford Field [facility] Pulmonology.      Most Recent Cardiac Studies: Echo 04/2013:  Study Conclusions  - Left ventricle: The cavity size was normal. There was mild concentric hypertrophy. Systolic function was normal. The estimated ejection fraction was in the range of 50% to 55%. Wall motion was normal; there were no regional wall motion abnormalities. The study was not technically sufficient to allow evaluation of LV diastolic dysfunction due to atrial fibrillation. - Aortic valve: No regurgitation. - Aortic root: The aortic root was normal in size. - Mitral valve: Calcified annulus. Mildly thickened leaflets . Trivial regurgitation. - Left atrium: The atrium was mildly dilated. - Right ventricle: Systolic function was mildly reduced. - Right atrium: The atrium was mildly to moderately dilated. Pacer wire or catheter noted in right atrium. - Tricuspid valve: Mild regurgitation. - Pulmonary arteries: Systolic pressure was mildly to moderately increased. PA peak pressure: 48mm Hg (S). - Pericardium, extracardiac: There was no pericardial effusion.   Surgical History:  Past Surgical History  Procedure Laterality Date  . Appendectomy    . Abdominal hysterectomy    . Tonsillectomy    . Kidney surgery      Right  . Ankle fracture surgery      Left, pin  . Pacemaker insertion  06/20/08    MDT EnRhythm DR implanted by Dr Reyes Ivan  . Breast surgery      Needle guided excision, right breast calcification  . Breast fibroadenoma surgery  2010    Right  . Skin graft      left leg     Home Meds: Prior to Admission medications   Medication Sig Start Date End Date Taking? Authorizing Provider  atenolol (TENORMIN) 25 MG tablet Take 25 mg by mouth 2 (two) times daily.   Yes Historical Provider, MD  atorvastatin (LIPITOR) 40 MG tablet Take 0.5 tablets (20 mg total) by mouth at bedtime. 03/13/14  Yes Rosalio Macadamia, NP   calcium-vitamin D (OSCAL WITH D) 500-200 MG-UNIT per tablet Take 1 tablet by mouth daily.   Yes Historical Provider, MD  diltiazem (CARDIZEM CD) 180 MG 24 hr capsule TAKE 1 CAPSULE BY MOUTH DAILY 11/30/13  Yes Peter M Sw[place], MD  dorzolamide-timolol (COSOPT) 22.3-6.8 MG/ML ophthalmic solution Place 1 drop into both eyes 2 (two) times daily.    Yes Historical Provider, MD  escitalopram (LEXAPRO) 10 MG tablet Take 10 mg by mouth daily.   Yes Historical Provider, MD  furosemide (LASIX) 80 MG tablet Take 40 mg by mouth 2 (two) times daily.    Yes Historical Provider, MD  HYDROcodone-acetaminophen (NORCO) 10-325 MG per tablet Take 1 tablet by mouth 4 (four) times daily as needed for severe pain.    Yes Historical Provider, MD  losartan (COZAAR) 25  MG tablet Take 25 mg by mouth daily.   Yes Historical Provider, MD  polyethylene glycol (MIRALAX / GLYCOLAX) packet Take 17 g by mouth daily as needed for mild constipation.   Yes Historical Provider, MD  potassium chloride (K-DUR,KLOR-CON) 10 MEQ tablet Take 1 tablet (10 mEq total) by mouth 2 (two) times daily. 06/09/13  Yes Peter M [place], MD  SYNTHROID 125 MCG tablet TAKE 1 TABLET BY MOUTH DAILY 08/04/11  Yes Romero Belling, MD  traMADol (ULTRAM) 50 MG tablet Take 50 mg by mouth 3 (three) times daily as needed for moderate pain.    Yes Historical Provider, MD  travoprost, benzalkonium, (TRAVATAN) 0.004 % ophthalmic solution Place 1 drop into both eyes daily.    Yes Historical Provider, MD  Vitamin D, Ergocalciferol, (DRISDOL) 50000 UNITS CAPS capsule Take 50,000 Units by mouth once a week.  03/02/14  Yes Historical Provider, MD  warfarin (COUMADIN) 5 MG tablet TAKE AS DIRECTED BY COUMADIN CLINIC 12/19/13  Yes Peter M [place], MD  atenolol (TENORMIN) 50 MG tablet TAKE 1/2 TABLET BY MOUTH TWICE DAILY Patient not taking: Reported on 05/31/2014 12/08/13   Peter M [place], MD    Inpatient Medications:  . atenolol  25 mg Oral BID  . atorvastatin  20 mg Oral QHS  .  calcium-vitamin D  1 tablet Oral Daily  . diltiazem  180 mg Oral Daily  . dorzolamide-timolol  1 drop Both Eyes BID  . escitalopram  10 mg Oral Daily  . feeding supplement (ENSURE ENLIVE)  237 mL Oral BID BM  . furosemide  40 mg Intravenous Q12H  . levothyroxine  125 mcg Oral QAC breakfast  . losartan  25 mg Oral Daily  . potassium chloride  10 mEq Oral BID  . sodium chloride  3 mL Intravenous Q12H  . Travoprost (BAK Free)  1 drop Both Eyes Daily  . Vitamin D (Ergocalciferol)  50,000 Units Oral Weekly  . warfarin  4 mg Oral Daily      Allergies:  Allergies  Allergen Reactions  . Amoxicillin Other (See Comments)    Reaction:  Weakness  . Digoxin Other (See Comments)    Reaction:  Weakness and dehydration  . Risedronate Sodium Rash    History   Social History  . Marital Status: Married    Spouse Name: N/A  . Number of Children: 3  . Years of Education: 13   Occupational History  . RETIRED    Social History Main Topics  . Smoking status: Never Smoker   . Smokeless tobacco: Never Used  . Alcohol Use: No  . Drug Use: No  . Sexual Activity: No   Other Topics Concern  . 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  Married 1948. 3 sons - 06/22/2050, 06/22/2055, 06/21/2056; 5 grandchildren-one deceased -  OD @ 56.  Lives alone with husband and they are independent in ADLs.  End of Life Care: no prolonged heroic measures, i.e. Artificial feeding or hydration; DNR; no prolonged intubation.     Family History  Problem Relation Age of Onset  . Cancer Mother     colon with mets  . Diabetes Mother   . Heart disease Father   . Hyperlipidemia Father   . Hypertension Father   . Stroke Brother     brother who died Jun 30, 2022  . Cancer Paternal Aunt  breast  . Cancer Cousin     uterine, lung cancer  . Heart attack Brother   . Heart attack Father      Review of Systems: Review of Systems   Constitutional: Positive for malaise/fatigue. Negative for fever, chills, weight loss and diaphoresis.  HENT: Negative for congestion, ear pain, hearing loss, sore throat and tinnitus.   Eyes: Negative for blurred vision, double vision, photophobia, pain, discharge and redness.  Respiratory: Positive for cough and shortness of breath. Negative for hemoptysis, sputum production and wheezing.   Cardiovascular: Positive for orthopnea and PND. Negative for chest pain, palpitations, claudication and leg swelling.  Gastrointestinal: Negative for heartburn, nausea, vomiting, abdominal pain, diarrhea, constipation, blood in stool and melena.  Musculoskeletal: Negative for joint pain and falls.  Skin: Negative for itching and rash.  Neurological: Positive for weakness. Negative for dizziness, tingling, loss of consciousness and headaches.  Psychiatric/Behavioral: The patient is not nervous/anxious.     Labs:  Recent Labs  05/31/14 1255 05/31/14 1728 06/01/14 0022  TROPONINI <0.03 0.03 <0.03   Lab Results  Component Value Date   WBC 8.5 05/31/2014   HGB 13.3 05/31/2014   HCT 40.3 05/31/2014   MCV 90.9 05/31/2014   PLT 215 05/31/2014    Recent Labs Lab 05/31/14 1255  NA 139  K 3.5  CL 106  CO2 25  BUN 19  CREATININE 0.57  CALCIUM 9.0  PROT 7.7  BILITOT 0.8  ALKPHOS 113  ALT 23  AST 30  GLUCOSE 109*   Lab Results  Component Value Date   CHOL 117 06/01/2014   HDL 43 06/01/2014   LDLCALC 60 06/01/2014   TRIG 68 06/01/2014   No results found for: DDIMER  Radiology/Studies:  Dg Chest Port 1 View  05/31/2014   CLINICAL DATA:  81 year old female with shortness of breath and weakness. Initial encounter.  EXAM: PORTABLE CHEST - 1 VIEW  COMPARISON:  11/06/2013 and earlier.  FINDINGS: Portable AP semi upright view at 1341 hours. Continued bilateral pleural effusions greater on the right, up to moderate. Chronic left chest cardiac pacemaker. Stable cardiomegaly and mediastinal  contours. Pulmonary vascularity also appears stable without overt edema. No pneumothorax. No new pulmonary opacity.  IMPRESSION: Up to moderate bilateral pleural effusions appear stable compared to 2015. No overt pulmonary edema.   Electronically Signed   By: Odessa FlemingH  Hall M.D.   On: 05/31/2014 14:10    EKG: a-fib, 81 bpm, occasional PVCs, 1 mm st depression II, TWI III  Weights: Filed Weights   05/31/14 1218 05/31/14 1720 06/01/14 0404  Weight: 98 lb (44.453 kg) 98 lb 4.8 oz (44.589 kg) 97 lb 6.4 oz (44.18 kg)     Physical Exam: Blood pressure 121/51, pulse 69, temperature 97.4 F (36.3 C), temperature source Oral, resp. rate 18, height 5\' 3"  (1.6 m), weight 97 lb 6.4 oz (44.18 kg), SpO2 95 %. Body mass index is 17.26 kg/(m^2). General: Well developed, well nourished, in no acute distress. Head: Normocephalic, atraumatic, sclera non-icteric, no xanthomas, nares are without discharge.  Neck: Negative for carotid bruits. JVD not elevated. Lungs: Bilateral crackles at the bases extending 1/3 proximally. Breathing is unlabored. Heart: Irregularly irregular, with S1 S2. No murmurs, rubs, or gallops appreciated. Abdomen: Soft, non-tender, non-distended with normoactive bowel sounds. No hepatomegaly. No rebound/guarding. No obvious abdominal masses. Msk:  Strength and tone appear normal for age. Extremities: No clubbing or cyanosis. No edema.  Distal pedal pulses are 2+ and equal bilaterally. Neuro: Alert and oriented X  3. No facial asymmetry. No focal deficit. Moves all extremities spontaneously. Psych:  Responds to questions appropriately with a normal affect.    Assessment and Plan:  1. Acute respiratory distress with hypoxia in the setting of acute on chronic diastolic CHF: -She has a history of medication noncompliance and dietary indiscretions -Negative 585 for the admission to date -She would be a high risk thoracentesis given her supratherapeutic INR upon admission and advanced  age -Continue steady diuresis at this time via IV Lasix and assess for subjective and clinical improvement  -Increase IV Lasix to 40 mg q 8 hours -Replete potassium  -Check echo -Continue atenolol 25 mg bid  2. Chronic a-fib: -Rate controlled -INR therapeutic this morning -Continue atenolol and diltiazem   3. CAD with known occlusion of LCx: -No angina -Continue Lipitor -On Coumadin in place of aspirin -No ischemic work up at this time  4. HTN: -Continue current medications as above, including losartan -Controlled  5. HLD: -On Lipitor  6. Hypothyroidism: -Continue Synthroid   Signed, DUNN,RYAN PA-C 06/01/2014, 8:21 AM  Patient seen and examined, agree with detailed note above,  Patient presentation and plan discussed on rounds.  She admits to missing several lasix doses over the past few days. Given the severity of her pleural effusions and severely elevated right heart pressures on echo, Suspect this is another exacerbation of her  acute on chronic diastolic CHF  --Would continue aggressive diuresis with lasix IV, close monitoring of renal function.  Dossie Arbourim Keshia Weare  M.D., Ph.D.

## 2014-06-01 NOTE — Progress Notes (Signed)
Initial Nutrition Assessment  DOCUMENTATION CODES:  Severe malnutrition in context of chronic illness  INTERVENTION: Medical Nutrition Supplement: Agree with Ensure Enlive BID for additional nutrition. Meals and Snacks: Cater to patient preferences  NUTRITION DIAGNOSIS:  Unintentional weight loss related to chronic illness as evidenced by energy intake < 75% for > or equal to 3 months, percent weight loss, moderate depletion of body fat, severe depletion of muscle mass.  GOAL: Energy Intake: Patient will meet greater than or equal to 90% of their needs  MONITOR:  PO intake, Supplement acceptance, Skin, Weight trends, Labs  REASON FOR ASSESSMENT:  Consult Poor PO  ASSESSMENT: Reason For Admission: Admitted with CHF Exacerbation PMHx: HTN, HLD,  Typical Fluid/ Food Intake: 100% recorded per I/O of dinner last evening Meal/ Snack Patterns: Pt reports eating a regular, low salt diet at home with no other restrictions. Denies chewing/ swallowing difficulty. Reports having a poor/ fair appetite x 3-6 months. She reports she rarely eats more than 2 meals per day. Diet recall- Breakfast: a breakfast meat and a biscuit or cereal with fruit. Lunch or Dinner: sandwich, maybe leftovers, sometimes "nothing". She reports it has been "months" since she has had a day of typical intake. Supplements: Ensure "every once in a while"  Labs:  Electrolyte and Renal Profile:    Recent Labs Lab 05/31/14 1255  BUN 19  CREATININE 0.57  NA 139  K 3.5   Protein Profile:  Recent Labs Lab 05/31/14 1255  ALBUMIN 4.1   Meds: Lipitor, Lasix, Vit D  UOP: 825 ml/ admission  Physical Findings: patient is frail looking, reports constant back pain, on O2 [place] Weight Changes: Patient reports a UBW of 107, last known 1 year ago. She reports that 2 years ago, she was 125# and had been that weight for some time. Currently at 97#, which the patient says "is the lowest I've ever been". Current weight  represents a 9.3% weight loss x 1 year, which is not significant.  Height:  Ht Readings from Last 1 Encounters:  05/31/14 5\' 3"  (1.6 m)    Weight:  Wt Readings from Last 1 Encounters:  06/01/14 97 lb 6.4 oz (44.18 kg)    Ideal Body Weight:  52.27 kg  Wt Readings from Last 10 Encounters:  06/01/14 97 lb 6.4 oz (44.18 kg)  11/02/13 105 lb (47.628 kg)  04/12/14 106 lb (48.081 kg)  03/21/14 109 lb 12.8 oz (49.805 kg)  03/10/14 103 lb (46.72 kg)  03/09/14 103 lb (46.72 kg)  01/30/14 104 lb 14.4 oz (47.582 kg)  11/10/13 107 lb 6.4 oz (48.716 kg)  09/26/13 104 lb 8 oz (47.401 kg)  06/09/13 107 lb (48.535 kg)    BMI:  Body mass index is 17.26 kg/(m^2).  Estimated Nutritional Needs:  Kcal:  1320-1546 kcal/ day (BEE: 917 x 1.2 AF x 1.2-1.4 IF)- using IBW of 52.3 kg  Protein:  62-73g Pro/ day (1.2-1.4 g Pro/ kg IBW/ day)  Fluid:  1307-1569 ml/ day (25-30 ml/ kg IBW)  Skin:  Reviewed, no issues  Diet Order:  Diet Heart Room service appropriate?: Yes; Fluid consistency:: Thin  EDUCATION NEEDS:  No education needs identified at this time   Intake/Output Summary (Last 24 hours) at 06/01/14 1031 Last data filed at 06/01/14 0631  Gross per 24 hour  Intake    240 ml  Output    825 ml  Net   -585 ml    Last BM:  5/4  Joeseph Amorracey L. Gaines, RDN [phone]  HIGH Care Level

## 2014-06-01 NOTE — Progress Notes (Signed)
PROGRESS NOTE  Laurie CobbsMargaret N Larsen XBJ:[id number] DOB: 1923-02-21 DOA: 05/31/2014 PCP: Laurie ReichmannHANDE,Laurie Kaeser, MD  HPI/Recap of past 24 hours: [age] y/o f with hx of Chronic Diastolic CHF, HTN, CAD,A-fib,Sick sinus syndrome, Hypothyroidism Admitted with respiratory distress secondary to CHF. Doing better with IV lasix this am      Consultants: Dr. Lewie LoronGolan   Objective: BP 121/51 mmHg  Pulse 69  Temp(Src) 97.4 F (36.3 C) (Oral)  Resp 18  Ht 5\' 3"  (1.6 m)  Wt 44.18 kg (97 lb 6.4 oz)  BMI 17.26 kg/m2  SpO2 95%  Intake/Output Summary (Last 24 hours) at 06/01/14 0819 Last data filed at 06/01/14 0631  Gross per 24 hour  Intake    240 ml  Output    825 ml  Net   -585 ml   Filed Weights   05/31/14 1218 05/31/14 1720 06/01/14 0404  Weight: 44.453 kg (98 lb) 44.589 kg (98 lb 4.8 oz) 44.18 kg (97 lb 6.4 oz)    Exam:   General:  Thin built  Cardiovascular: S1 S2  Respiratory: Decreased air entry bilat  Abdomen: Soft Non tender  Musculoskeletal: Normal  Data Reviewed: Basic Metabolic Panel:  Recent Labs Lab 05/31/14 1255  NA 139  K 3.5  CL 106  CO2 25  GLUCOSE 109*  BUN 19  CREATININE 0.57  CALCIUM 9.0   Liver Function Tests:  Recent Labs Lab 05/31/14 1255  AST 30  ALT 23  ALKPHOS 113  BILITOT 0.8  PROT 7.7  ALBUMIN 4.1   No results for input(s): LIPASE, AMYLASE in the last 168 hours. No results for input(s): AMMONIA in the last 168 hours. CBC:  Recent Labs Lab 05/31/14 1255  WBC 8.5  NEUTROABS 7.3*  HGB 13.3  HCT 40.3  MCV 90.9  PLT 215   Cardiac Enzymes:    Recent Labs Lab 05/31/14 1255 05/31/14 1728 06/01/14 0022  TROPONINI <0.03 0.03 <0.03   BNP (last 3 results)  Recent Labs  05/31/14 1255  BNP 1124.0*    ProBNP (last 3 results)  Recent Labs  11/10/13 1228 03/10/14 1014 03/21/14 1500  PROBNP 418.0* 925.0* 844.0*    CBG: No results for input(s): GLUCAP in the last 168 hours.  No results found for this or any previous  visit (from the past 240 hour(s)).   Studies: Dg Chest Port 1 View  05/31/2014   CLINICAL DATA:  81 year old female with shortness of breath and weakness. Initial encounter.  EXAM: PORTABLE CHEST - 1 VIEW  COMPARISON:  11/06/2013 and earlier.  FINDINGS: Portable AP semi upright view at 1341 hours. Continued bilateral pleural effusions greater on the right, up to moderate. Chronic left chest cardiac pacemaker. Stable cardiomegaly and mediastinal contours. Pulmonary vascularity also appears stable without overt edema. No pneumothorax. No new pulmonary opacity.  IMPRESSION: Up to moderate bilateral pleural effusions appear stable compared to 2015. No overt pulmonary edema.   Electronically Signed   By: Odessa FlemingH  Hall M.D.   On: 05/31/2014 14:10    Scheduled Meds: . atenolol  25 mg Oral BID  . atorvastatin  20 mg Oral QHS  . calcium-vitamin D  1 tablet Oral Daily  . diltiazem  180 mg Oral Daily  . dorzolamide-timolol  1 drop Both Eyes BID  . escitalopram  10 mg Oral Daily  . feeding supplement (ENSURE ENLIVE)  237 mL Oral BID BM  . furosemide  40 mg Intravenous Q12H  . levothyroxine  125 mcg Oral QAC breakfast  . losartan  25  mg Oral Daily  . potassium chloride  10 mEq Oral BID  . sodium chloride  3 mL Intravenous Q12H  . Travoprost (BAK Free)  1 drop Both Eyes Daily  . Vitamin D (Ergocalciferol)  50,000 Units Oral Weekly  . warfarin  4 mg Oral Daily    1 Acute  Shortness of breath secondary to diastolic CHF and bilat pleural effusions Continue IV lasix. Check ECHO Await cardiology input  2 Chronic A-fib; On Coumadin 3 HTN: Continue Cardizem. Pt is DNR  Aurelius Gildersleeve   06/01/2014, 8:19 AM  LOS: 1 day

## 2014-06-01 NOTE — Care Management (Addendum)
Patient and her husband are residents of [address] [place] [facility].  She chronic home oxygen.  Presented with shortness of breath due to exac of CHF.  She admits to missing a few doses of her diuretic. At present would anticipate patient will be able to return home.  May benefit from home health nurse follow up

## 2014-06-01 NOTE — Progress Notes (Addendum)
History:diastolic congestive heart failure, A-fib, CHF, CAD, sick sinus syndrome, chonic pain, glaucoma, hyperlipidemia, diverticulitis, phlebitis, hypothyroidism, HTN Prefers to be called Laurie Larsen.  High risk fall bundle, fall sign in place, yellow socks on, toileting offered on Q2H rounding, fall bracelet in placet.  Pt. Rested peacefully through the night.  Pt voided clear amber urine. No acute distress noted. . Will continue to monitor pt.

## 2014-06-02 LAB — BASIC METABOLIC PANEL
Anion gap: 7 (ref 5–15)
BUN: 34 mg/dL — AB (ref 6–20)
CHLORIDE: 100 mmol/L — AB (ref 101–111)
CO2: 29 mmol/L (ref 22–32)
CREATININE: 0.84 mg/dL (ref 0.44–1.00)
Calcium: 9.1 mg/dL (ref 8.9–10.3)
GFR calc Af Amer: >60 mL/min (ref >60)
GFR calc non Af Amer: 60 mL/min — ABNORMAL LOW (ref >60)
GLUCOSE: 132 mg/dL — AB (ref 65–99)
Potassium: 4.5 mmol/L (ref 3.5–5.1)
Sodium: 136 mmol/L (ref 135–145)

## 2014-06-02 LAB — PROTIME-INR
INR: 2.36
Prothrombin Time: 25.9 seconds — ABNORMAL HIGH (ref 11.4–15.0)

## 2014-06-02 NOTE — Care Management Note (Deleted)
Case Management Note  Patient Details  Name: Cecil CobbsMargaret N Plotz MRN: [id number] Date of Birth: 11/15/1923  Subjective/Objective:               Patient tearful because she wants to go home.  Family is arriving from out of town tomorrow.   [facility] and patient says "I use it when I need it."  Will contact [facility] to determine the original order.  She is agreeable to have home health nurse follow up at discharge.     Action/Plan:   Expected Discharge Date:   06/03/14               Expected Discharge Plan:   home   In-House Referral:     Discharge planning Services   home health nurse  Post Acute Care Choice:   offered Choice offered to:   no preference  DME Arranged:    DME Agency:     HH Arranged:   nurse HH Agency:   Heads up referral to [facility]  Status of Service:     Medicare Important Message Given:   yes Date Medicare IM Given:   06/02/14 Medicare IM give by:   Ermalene Searingnann Tadeusz Stahl Date Additional Medicare IM Given:    Additional Medicare Important Message give by:     If discussed at Long Length of Stay Meetings, dates discussed:    Additional Comments:  Eber HongGreene, Jesua Tamblyn R, RN 06/02/2014, 10:10 AM

## 2014-06-02 NOTE — Care Management Note (Signed)
Case Management Note  Patient Details  Name: Laurie Larsen MRN: [id number] Date of Birth: [birth year]  Subjective/Objective:            Notifi5/7/16ed primary nurse that patient's home 021 is not continuous and will need to see is patient is going to require continuous home 02.          Action/Plan:   Expected Discharge Date:   06/03/14               Expected Discharge Plan:     In-House Referral:     Discharge planning Services     Post Acute Care Choice:    Choice offered to:     DME Arranged:    DME Agency:     HH [facility]   [facility] Agency:   heads up referral faxed to [facility]  Status of Service:     Medicare Important Message Given:    Date Medicare IM Given:    Medicare IM give by:    Date Additional Medicare IM Given:    Additional Medicare Important Message give by:     If discussed at Long Length of Stay Meetings, dates discussed:    Additional Comments:  Eber HongGreene, Trystan Akhtar R, RN 06/02/2014, 11:36 AM

## 2014-06-02 NOTE — Clinical Documentation Improvement (Signed)
QUERY # 2  . Using a prescribed medication less frequently than prescribed, in small doses, or not using the medication as instructed should be documented as "underdosing" by the provider  Supporting Information: ED report: typically takes Lasix but missed her dose yesterday.  H&P: Patient reports that she missed her home dose of Lasix yesterday 2 times MEDS at home: Lasix 80 mg take 40 mg by mouth 2 times daily  Thank You,  Elpidio AnisGarnet Tamre Cass, RN, BSN, CDI [phone] [facility]

## 2014-06-02 NOTE — Clinical Documentation Improvement (Signed)
Supporting Information: Pt. reports SOB x 1 week. ED note "wears 2.5 L home oxygen nightly" Respiratory: Mild tachypnea, diminished breath sounds bilateral bases with crackles >Currently requiring 5 L of oxygen supplementally to maintain O2 sat 93%. H&P note:" acute respiratory distress with hypoxia secondary to acute on chronic diastolic CHF"  Please indicate(using your clinical judgement) if clinical indicators specify more significant impact on severity of acute respiratory distress with hypoxia.  . Document acuity: --Acute --Chronic --Acute on chronic . Document inclusion of: --Hypoxia . Document any associated diagnoses/conditions: CHF  Thank You,  Laurie AnisGarnet Levy Wellman, RN, BSN, CDI [phone] [facility].

## 2014-06-02 NOTE — Care Management Note (Signed)
Case Management Note  Patient Details  Name: Cecil CobbsMargaret N Gagner MRN: [id number] Date of Birth: 1923-03-19  Subjective/Objective:                    Action/Plan:   Expected Discharge Date:                  Expected Discharge Plan:     In-House Referral:     Discharge planning Services     Post Acute Care Choice:    Choice offered to:     DME Arranged:    DME Agency:     HH Arranged:    HH Agency:     Status of Service:     Medicare Important Message Given:   yes Date Medicare IM Given:   06/02/2014 Medicare IM give by:   Ermalene Searingnann Asuka Dusseau Date Additional Medicare IM Given:    Additional Medicare Important Message give by:     If discussed at Long Length of Stay Meetings, dates discussed:    Additional Comments:  Eber HongGreene, Kaleigh Spiegelman R, RN 06/02/2014, 8:57 AM

## 2014-06-02 NOTE — Care Management (Signed)
Is not going to require home 02.  Confirmed that [facility] received heads up referral

## 2014-06-02 NOTE — Progress Notes (Signed)
ANTICOAGULATION CONSULT NOTE - Follow Up Consult  Pharmacy Consult for warfarin Indication: atrial fibrillation  Allergies  Allergen Reactions  . Amoxicillin Other (See Comments)    Reaction:  Weakness  . Digoxin Other (See Comments)    Reaction:  Weakness and dehydration  . Risedronate Sodium Rash    Patient Measurements: Height: 5\' 3"  (160 cm) Weight: 100 lb 14.4 oz (45.768 kg) IBW/kg (Calculated) : 52.4   Vital Signs: Temp: 97.5 F (36.4 C) (05/06 0535) BP: 116/61 mmHg (05/06 0535) Pulse Rate: 69 (05/06 0535)  Labs:  Recent Labs  05/31/14 1255 05/31/14 1728 06/01/14 0022 06/01/14 0438 06/02/14 0455  HGB 13.3  --   --   --   --   HCT 40.3  --   --   --   --   PLT 215  --   --   --   --   APTT 35  --   --   --   --   LABPROT 32.5*  --   --  29.7* 25.9*  INR 3.16  --   --  2.81 2.36  CREATININE 0.57  --   --   --   --   TROPONINI <0.03 0.03 <0.03  --   --     Estimated Creatinine Clearance: 34.5 mL/min (by C-G formula based on Cr of 0.57).   Medications:  Scheduled:  . atenolol  25 mg Oral BID  . atorvastatin  20 mg Oral QHS  . calcium-vitamin D  1 tablet Oral Daily  . diltiazem  180 mg Oral Daily  . dorzolamide-timolol  1 drop Both Eyes BID  . escitalopram  10 mg Oral QHS  . feeding supplement (ENSURE ENLIVE)  237 mL Oral BID BM  . furosemide  40 mg Intravenous 3 times per day  . levothyroxine  125 mcg Oral QAC breakfast  . losartan  25 mg Oral QHS  . potassium chloride  40 mEq Oral BID  . sodium chloride  3 mL Intravenous Q12H  . Travoprost (BAK Free)  1 drop Both Eyes QHS  . Vitamin D (Ergocalciferol)  50,000 Units Oral Weekly  . warfarin  4 mg Oral Daily    Assessment: Patient is an 81 yo female admitted for shortness of breath and found to be hypoxic.  Patient with atrial fibrillation on chronic warfarin therapy.  Per Med Rec, patient takes warfarin 5 mg po daily and holds doses as directed by her physician.    INR History: 5/4 - 3.16, no  warfarin given 5/5 - 2.81, warfarin 4 mg given 5/6 - 2.36  Goal of Therapy:  INR 2-3 Monitor INR daily and hemoglobin every 4 days while receiving warfarin.   Plan:  INR is within desired range of 2-3.  Will continue warfarin at lower dose of 4 mg daily.   Will recheck INR with AM labs.   Clarisa Schoolsrystal Kylee Nardozzi, PharmD Clinical Pharmacist 06/02/2014

## 2014-06-02 NOTE — Progress Notes (Signed)
PROGRESS NOTE  Laurie Larsen OZH:[id number] DOB: 1923-09-22 DOA: 05/31/2014 PCP: Barbette ReichmannHANDE,[name], MD  HPI/Recap of past 24 hours: 81 y/o f with hx of Chronic Diastolic CHF, HTN, CAD,A-fib,Sick sinus syndrome, Hypothyroidism Admitted with respiratory distress secondary to CHF. Doing better with IV lasix       Consultants: Dr. Lewie LoronGolan   Objective: BP 116/61 mmHg  Pulse 69  Temp(Src) 97.5 F (36.4 C) (Oral)  Resp 16  Ht 5\' 3"  (1.6 m)  Wt 45.768 kg (100 lb 14.4 oz)  BMI 17.88 kg/m2  SpO2 98%  Intake/Output Summary (Last 24 hours) at 06/02/14 0811 Last data filed at 06/02/14 0700  Gross per 24 hour  Intake    360 ml  Output   1801 ml  Net  -1441 ml   Filed Weights   05/31/14 1720 06/01/14 0404 06/02/14 0535  Weight: 44.589 kg (98 lb 4.8 oz) 44.18 kg (97 lb 6.4 oz) 45.768 kg (100 lb 14.4 oz)    Exam:   General:  Thin built  Cardiovascular: S1 S2  Respiratory: Decreased air entry bilat  Abdomen: Soft Non tender  Musculoskeletal: Normal  Data Reviewed: Basic Metabolic Panel:  Recent Labs Lab 05/31/14 1255  NA 139  K 3.5  CL 106  CO2 25  GLUCOSE 109*  BUN 19  CREATININE 0.57  CALCIUM 9.0   Liver Function Tests:  Recent Labs Lab 05/31/14 1255  AST 30  ALT 23  ALKPHOS 113  BILITOT 0.8  PROT 7.7  ALBUMIN 4.1   No results for input(s): LIPASE, AMYLASE in the last 168 hours. No results for input(s): AMMONIA in the last 168 hours. CBC:  Recent Labs Lab 05/31/14 1255  WBC 8.5  NEUTROABS 7.3*  HGB 13.3  HCT 40.3  MCV 90.9  PLT 215   Cardiac Enzymes:    Recent Labs Lab 05/31/14 1255 05/31/14 1728 06/01/14 0022  TROPONINI <0.03 0.03 <0.03   BNP (last 3 results)  Recent Labs  05/31/14 1255  BNP 1124.0*    ProBNP (last 3 results)  Recent Labs  11/10/13 1228 03/10/14 1014 03/21/14 1500  PROBNP 418.0* 925.0* 844.0*    CBG: No results for input(s): GLUCAP in the last 168 hours.  No results found for this or any  previous visit (from the past 240 hour(s)).   Studies: No results found.  Scheduled Meds: . atenolol  25 mg Oral BID  . atorvastatin  20 mg Oral QHS  . calcium-vitamin D  1 tablet Oral Daily  . diltiazem  180 mg Oral Daily  . dorzolamide-timolol  1 drop Both Eyes BID  . escitalopram  10 mg Oral QHS  . feeding supplement (ENSURE ENLIVE)  237 mL Oral BID BM  . furosemide  40 mg Intravenous 3 times per day  . levothyroxine  125 mcg Oral QAC breakfast  . losartan  25 mg Oral QHS  . potassium chloride  40 mEq Oral BID  . sodium chloride  3 mL Intravenous Q12H  . Travoprost (BAK Free)  1 drop Both Eyes QHS  . Vitamin D (Ergocalciferol)  50,000 Units Oral Weekly  . warfarin  4 mg Oral Daily    1 Acute  Shortness of breath secondary to diastolic CHF and bilat pleural effusions Continue IV lasix and Losartan Check CXR today  ECHO - Awaiting official report  2 Chronic A-fib; On Coumadin 3 HTN: Continue Cardizem. 4 Anxiety and depression: On Escitalopram Pt is DNR  Ramiro Pangilinan   06/02/2014, 8:11 AM  LOS: 2  days

## 2014-06-02 NOTE — Progress Notes (Signed)
Patient: Laurie Larsen / Admit Date: 05/31/2014 / Date of Encounter: 06/02/2014, 12:39 PM   Subjective: Breathing improving on IV Lasix 40 mg q 8 hours. Bmet pending. Echo showed severely elevated right heart pressures. She re-iterates missing doses of her Lasix at home and eating out a [employer], and [address] [employer].   Review of Systems: Review of Systems  Constitutional: Positive for malaise/fatigue. Negative for fever, chills, weight loss and diaphoresis.  Respiratory: Positive for cough and shortness of breath. Negative for hemoptysis, sputum production and wheezing.   Cardiovascular: Negative for chest pain, palpitations, orthopnea, claudication, leg swelling and PND.  Neurological: Positive for weakness.    Objective: Telemetry: NSR, 70s Physical Exam: Blood pressure 114/65, pulse 78, temperature 97.7 F (36.5 C), temperature source Oral, resp. rate 18, height 5\' 3"  (1.6 m), weight 100 lb 14.4 oz (45.768 kg), SpO2 92 %. Body mass index is 17.88 kg/(m^2). General: Well developed, well nourished, in no acute distress. Head: Normocephalic, atraumatic, sclera non-icteric, no xanthomas, nares are without discharge. Neck: Negative for carotid bruits. JVP not elevated. Lungs: Bilateral crackles at the bases extending 1/3 proximally still. Breathing is unlabored. Off O2.  Heart: RRR S1 S2 without murmurs, rubs, or gallops.  Abdomen: Soft, non-tender, non-distended with normoactive bowel sounds. No rebound/guarding. Extremities: No clubbing or cyanosis. No edema. Distal pedal pulses are 2+ and equal bilaterally. Neuro: Alert and oriented X 3. Moves all extremities spontaneously. Psych:  Responds to questions appropriately with a normal affect.   Intake/Output Summary (Last 24 hours) at 06/02/14 1239 Last data filed at 06/02/14 0830  Gross per 24 hour  Intake    240 ml  Output   1801 ml  Net  -1561 ml    Inpatient Medications:  . atenolol  25 mg Oral BID  . atorvastatin   20 mg Oral QHS  . calcium-vitamin D  1 tablet Oral Daily  . diltiazem  180 mg Oral Daily  . dorzolamide-timolol  1 drop Both Eyes BID  . escitalopram  10 mg Oral QHS  . feeding supplement (ENSURE ENLIVE)  237 mL Oral BID BM  . furosemide  40 mg Intravenous 3 times per day  . levothyroxine  125 mcg Oral QAC breakfast  . losartan  25 mg Oral QHS  . potassium chloride  40 mEq Oral BID  . sodium chloride  3 mL Intravenous Q12H  . Travoprost (BAK Free)  1 drop Both Eyes QHS  . Vitamin D (Ergocalciferol)  50,000 Units Oral Weekly  . warfarin  4 mg Oral Daily   Infusions:    Labs:  Recent Labs  05/31/14 1255  NA 139  K 3.5  CL 106  CO2 25  GLUCOSE 109*  BUN 19  CREATININE 0.57  CALCIUM 9.0    Recent Labs  05/31/14 1255  AST 30  ALT 23  ALKPHOS 113  BILITOT 0.8  PROT 7.7  ALBUMIN 4.1    Recent Labs  05/31/14 1255  WBC 8.5  NEUTROABS 7.3*  HGB 13.3  HCT 40.3  MCV 90.9  PLT 215    Recent Labs  05/31/14 1255 05/31/14 1728 06/01/14 0022  TROPONINI <0.03 0.03 <0.03   Invalid input(s): POCBNP No results for input(s): HGBA1C in the last 72 hours.   Weights: Filed Weights   05/31/14 1720 06/01/14 0404 06/02/14 0535  Weight: 98 lb 4.8 oz (44.589 kg) 97 lb 6.4 oz (44.18 kg) 100 lb 14.4 oz (45.768 kg)  Radiology/Studies:  Dg Chest Port 1 View  05/31/2014   CLINICAL DATA:  81 year old female with shortness of breath and weakness. Initial encounter.  EXAM: PORTABLE CHEST - 1 VIEW  COMPARISON:  11/06/2013 and earlier.  FINDINGS: Portable AP semi upright view at 1341 hours. Continued bilateral pleural effusions greater on the right, up to moderate. Chronic left chest cardiac pacemaker. Stable cardiomegaly and mediastinal contours. Pulmonary vascularity also appears stable without overt edema. No pneumothorax. No new pulmonary opacity.  IMPRESSION: Up to moderate bilateral pleural effusions appear stable compared to 2015. No overt pulmonary edema.   Electronically  Signed   By: Odessa FlemingH  Hall M.D.   On: 05/31/2014 14:10     Assessment and Plan  1. Acute respiratory distress with hypoxia in the setting of acute on chronic diastolic CHF: -She has a history of medication noncompliance and dietary indiscretions -Negative 1561 for the admission to date -She would be a high risk thoracentesis given her supratherapeutic INR upon admission and advanced age -Continue steady moderately aggressive diuresis at this time via IV Lasix given subjective and clinical improvement  -Continue IV Lasix to 40 mg q 8 hours -BMET pending  -Echo with severely elevated right sided pressures  -Continue atenolol 25 mg bid  2. Chronic a-fib: -Rate controlled -INR therapeutic this morning -Continue atenolol and diltiazem   3. CAD with known occlusion of LCx: -No angina -Continue Lipitor -On Coumadin in place of aspirin -No ischemic work up at this time  4. HTN: -Continue current medications as above, including losartan -Controlled  5. HLD: -On Lipitor  6. Hypothyroidism: -Continue Synthroid   Signed, Eula ListenRyan Adhira Jamil, PA-C 06/02/2014 12:47 PM

## 2014-06-02 NOTE — Progress Notes (Signed)
Assumed care at 0100. VSS. Pt is paced on the monitor. Pt is diuresing. Pt has rested without any complications.Will continue to monitor and assess.

## 2014-06-03 ENCOUNTER — Inpatient Hospital Stay: Payer: Medicare Other

## 2014-06-03 DIAGNOSIS — I5033 Acute on chronic diastolic (congestive) heart failure: Principal | ICD-10-CM

## 2014-06-03 LAB — CBC WITH DIFFERENTIAL/PLATELET
Basophils Absolute: 0.1 10*3/uL (ref 0–0.1)
Basophils Relative: 1 %
Eosinophils Absolute: 0.3 10*3/uL (ref 0–0.7)
Eosinophils Relative: 3 %
HCT: 39.1 % (ref 35.0–47.0)
Hemoglobin: 13 g/dL (ref 12.0–16.0)
Lymphocytes Relative: 11 %
Lymphs Abs: 1 10*3/uL (ref 1.0–3.6)
MCH: 30.3 pg (ref 26.0–34.0)
MCHC: 33.4 g/dL (ref 32.0–36.0)
MCV: 90.8 fL (ref 80.0–100.0)
Monocytes Absolute: 1.2 10*3/uL — ABNORMAL HIGH (ref 0.2–0.9)
Monocytes Relative: 13 %
Neutro Abs: 6.3 10*3/uL (ref 1.4–6.5)
Neutrophils Relative %: 72 %
Platelets: 225 10*3/uL (ref 150–440)
RBC: 4.3 MIL/uL (ref 3.80–5.20)
RDW: 14.3 % (ref 11.5–14.5)
WBC: 8.8 10*3/uL (ref 3.6–11.0)

## 2014-06-03 LAB — BASIC METABOLIC PANEL WITH GFR
Anion gap: 8 (ref 5–15)
BUN: 42 mg/dL — ABNORMAL HIGH (ref 6–20)
CO2: 28 mmol/L (ref 22–32)
Calcium: 8.5 mg/dL — ABNORMAL LOW (ref 8.9–10.3)
Chloride: 98 mmol/L — ABNORMAL LOW (ref 101–111)
Creatinine, Ser: 1.14 mg/dL — ABNORMAL HIGH (ref 0.44–1.00)
GFR calc Af Amer: 48 mL/min — ABNORMAL LOW
GFR calc non Af Amer: 41 mL/min — ABNORMAL LOW
Glucose, Bld: 102 mg/dL — ABNORMAL HIGH (ref 65–99)
Potassium: 4.3 mmol/L (ref 3.5–5.1)
Sodium: 134 mmol/L — ABNORMAL LOW (ref 135–145)

## 2014-06-03 LAB — PROTIME-INR
INR: 2.34
Prothrombin Time: 25.8 s — ABNORMAL HIGH (ref 11.4–15.0)

## 2014-06-03 NOTE — Progress Notes (Signed)
RN ambulated with patient using walker and gait belt to nurses station and back without oxygen in use. Pt SpO2 at rest on room air was 93%, on exertion 91%. Pt reports feeling "exhausted" after the walk, but no complaints of dizziness or shortness of breath. Will notify Dr. Graciela HusbandsKlein, per his request.

## 2014-06-03 NOTE — Progress Notes (Signed)
Patient ID: Laurie Larsen, female   DOB: 1923-10-07, 81 y.o.   MRN: [id number]    Patient: Laurie Larsen / Admit Date: 05/31/2014 / Date of Encounter: 06/03/2014, 11:19 AM   Subjective: Breathing is better but she is very weak.  Not able to walk far due to weakness/fatigue.  Diuresed well yesterday on IV Lasix.   Review of Systems: ROS  Objective: Telemetry: NSR, 70s Physical Exam: Blood pressure 114/59, pulse 70, temperature 98.5 F (36.9 C), temperature source Oral, resp. rate 16, height 5\' 3"  (1.6 m), weight 100 lb 4.8 oz (45.496 kg), SpO2 91 %. Body mass index is 17.77 kg/(m^2). General: Well developed, well nourished, in no acute distress. Head: Normocephalic, atraumatic, sclera non-icteric, no xanthomas, nares are without discharge. Neck: Negative for carotid bruits. JVP not elevated. Lungs: Bilateral crackles at the bases extending 1/3 proximally still. Breathing is unlabored. Off O2.  Heart: RRR S1 S2 without murmurs, rubs, or gallops.  Abdomen: Soft, non-tender, non-distended with normoactive bowel sounds. No rebound/guarding. Extremities: No clubbing or cyanosis. No edema. Distal pedal pulses are 2+ and equal bilaterally. Neuro: Alert and oriented X 3. Moves all extremities spontaneously. Psych:  Responds to questions appropriately with a normal affect.   Intake/Output Summary (Last 24 hours) at 06/03/14 1119 Last data filed at 06/03/14 0830  Gross per 24 hour  Intake    480 ml  Output   1650 ml  Net  -1170 ml    Inpatient Medications:  . atenolol  25 mg Oral BID  . atorvastatin  20 mg Oral QHS  . calcium-vitamin D  1 tablet Oral Daily  . diltiazem  180 mg Oral Daily  . dorzolamide-timolol  1 drop Both Eyes BID  . escitalopram  10 mg Oral QHS  . feeding supplement (ENSURE ENLIVE)  237 mL Oral BID BM  . levothyroxine  125 mcg Oral QAC breakfast  . losartan  25 mg Oral QHS  . sodium chloride  3 mL Intravenous Q12H  . Travoprost (BAK Free)  1 drop Both Eyes QHS    . Vitamin D (Ergocalciferol)  50,000 Units Oral Weekly  . warfarin  4 mg Oral Daily   Infusions:    Labs:  Recent Labs  06/02/14 1326 06/03/14 0519  NA 136 134*  K 4.5 4.3  CL 100* 98*  CO2 29 28  GLUCOSE 132* 102*  BUN 34* 42*  CREATININE 0.84 1.14*  CALCIUM 9.1 8.5*    Recent Labs  05/31/14 1255  AST 30  ALT 23  ALKPHOS 113  BILITOT 0.8  PROT 7.7  ALBUMIN 4.1    Recent Labs  05/31/14 1255 06/03/14 0519  WBC 8.5 8.8  NEUTROABS 7.3* 6.3  HGB 13.3 13.0  HCT 40.3 39.1  MCV 90.9 90.8  PLT 215 225    Recent Labs  05/31/14 1255 05/31/14 1728 06/01/14 0022  TROPONINI <0.03 0.03 <0.03   Invalid input(s): POCBNP No results for input(s): HGBA1C in the last 72 hours.   Weights: Filed Weights   06/01/14 0404 06/02/14 0535 06/03/14 0551  Weight: 97 lb 6.4 oz (44.18 kg) 100 lb 14.4 oz (45.768 kg) 100 lb 4.8 oz (45.496 kg)     Radiology/Studies:  Dg Chest 2 View  06/03/2014   CLINICAL DATA:  Shortness breath, pleural effusion  EXAM: CHEST - 2 VIEW  COMPARISON:  05/31/2014  FINDINGS: Improved aeration. Small residual right pleural effusion. Minimal residual subsegmental atelectasis or scarring in the lower lobes. Stable cardiomegaly. Atheromatous aorta. Stable  left subclavian transvenous pacemaker. Mild wedge compression deformity in the lower thoracic spine, stable since 09/15/2013.  IMPRESSION: 1. Improved aeration since previous portable exam, with a small residual right pleural effusion.   Electronically Signed   By: Corlis Leak  Hassell M.D.   On: 06/03/2014 08:06   Dg Chest Port 1 View  05/31/2014   CLINICAL DATA:  81 year old female with shortness of breath and weakness. Initial encounter.  EXAM: PORTABLE CHEST - 1 VIEW  COMPARISON:  11/06/2013 and earlier.  FINDINGS: Portable AP semi upright view at 1341 hours. Continued bilateral pleural effusions greater on the right, up to moderate. Chronic left chest cardiac pacemaker. Stable cardiomegaly and mediastinal contours.  Pulmonary vascularity also appears stable without overt edema. No pneumothorax. No new pulmonary opacity.  IMPRESSION: Up to moderate bilateral pleural effusions appear stable compared to 2015. No overt pulmonary edema.   Electronically Signed   By: Odessa FlemingH  Hall M.D.   On: 05/31/2014 14:10     Assessment and Plan  1. Acute respiratory distress with hypoxia in the setting of acute on chronic diastolic CHF:  Echo (5/16) with EF 60-65%, PA systolic pressure 65 mmHg.  She has a history of medication noncompliance and dietary indiscretion.  - BUN/creatinine up today, hold Lasix IV today and likely start back on Lasix 40 mg po daily tomorrow.  2. Chronic a-fib: Rate controlled - Continue atenolol and diltiazem  - Anticoagulated with coumadin.  3. CAD with known occlusion of LCx: No angina.  -Continue Lipitor -On Coumadin in place of aspirin -No ischemic work up at this time 4. HTN: Hold losartan with bump in creatinine.   Marca AnconaDalton Joelee Snoke 06/03/2014 11:23 AM

## 2014-06-03 NOTE — Progress Notes (Signed)
Notified lab that STAT PT-INR has yet to be drawn. Need results before 1100 4mg  Warfarin can be given.

## 2014-06-03 NOTE — Progress Notes (Signed)
PROGRESS NOTE  Laurie CobbsMargaret N Larsen OZH:[id number] DOB: Apr 30, 1923 DOA: 05/31/2014 PCP: Barbette ReichmannHANDE,VISHWANATH, MD  HPI/Recap of past 24 hours: 81 y/o f with hx of Chronic Diastolic CHF, HTN, CAD,A-fib,Sick sinus syndrome, Hypothyroidism Admitted with respiratory distress secondary to CHF.  Breathing is improved, but feels very weak.  Not eating well.  No chest pain or palpitations reported.        Consultants: Dr. Lewie LoronGolan   Objective: BP 114/59 mmHg  Pulse 70  Temp(Src) 98.5 F (36.9 C) (Oral)  Resp 16  Ht 5\' 3"  (1.6 m)  Wt 45.496 kg (100 lb 4.8 oz)  BMI 17.77 kg/m2  SpO2 91%  Intake/Output Summary (Last 24 hours) at 06/03/14 1103 Last data filed at 06/03/14 0830  Gross per 24 hour  Intake    480 ml  Output   1650 ml  Net  -1170 ml   Filed Weights   06/01/14 0404 06/02/14 0535 06/03/14 0551  Weight: 44.18 kg (97 lb 6.4 oz) 45.768 kg (100 lb 14.4 oz) 45.496 kg (100 lb 4.8 oz)    Exam:   General:  Thin elderly female, NAD  HEENT: PERRL; OP moist without lesions  Neck: trachea midline  Cardiovascular: Irr irr  Respiratory: Basilar crackles without wheeze or retractions  Abdomen: Soft Non tender, ND, +BS  Musculoskeletal: no clubbing, cyanosis, or sig edema  Neuro: CN grossly intact; generally weak  Derm: no sig rashes or nodules.  Data Reviewed: Basic Metabolic Panel:  Recent Labs Lab 05/31/14 1255 06/02/14 1326 06/03/14 0519  NA 139 136 134*  K 3.5 4.5 4.3  CL 106 100* 98*  CO2 25 29 28   GLUCOSE 109* 132* 102*  BUN 19 34* 42*  CREATININE 0.57 0.84 1.14*  CALCIUM 9.0 9.1 8.5*   Liver Function Tests:  Recent Labs Lab 05/31/14 1255  AST 30  ALT 23  ALKPHOS 113  BILITOT 0.8  PROT 7.7  ALBUMIN 4.1   CBC:  Recent Labs Lab 05/31/14 1255 06/03/14 0519  WBC 8.5 8.8  NEUTROABS 7.3* 6.3  HGB 13.3 13.0  HCT 40.3 39.1  MCV 90.9 90.8  PLT 215 225   Cardiac Enzymes:    Recent Labs Lab 05/31/14 1255 05/31/14 1728 06/01/14 0022   TROPONINI <0.03 0.03 <0.03   BNP (last 3 results)  Recent Labs  05/31/14 1255  BNP 1124.0*    ProBNP (last 3 results)  Recent Labs  11/10/13 1228 03/10/14 1014 03/21/14 1500  PROBNP 418.0* 925.0* 844.0*     Studies: No results found.  Scheduled Meds: . atenolol  25 mg Oral BID  . atorvastatin  20 mg Oral QHS  . calcium-vitamin D  1 tablet Oral Daily  . diltiazem  180 mg Oral Daily  . dorzolamide-timolol  1 drop Both Eyes BID  . escitalopram  10 mg Oral QHS  . feeding supplement (ENSURE ENLIVE)  237 mL Oral BID BM  . furosemide  40 mg Intravenous 3 times per day  . levothyroxine  125 mcg Oral QAC breakfast  . losartan  25 mg Oral QHS  . potassium chloride  40 mEq Oral BID  . sodium chloride  3 mL Intravenous Q12H  . Travoprost (BAK Free)  1 drop Both Eyes QHS  . Vitamin D (Ergocalciferol)  50,000 Units Oral Weekly  . warfarin  4 mg Oral Daily    1 Acute on chronic diastolic CHF/acute resp failure- will need to decrease lasix due to rising BUN/Cr; cont to follow exam.  Cardiology following  2  Chronic A-fib; On Coumadin; follow INR, CBC 3 HTN: reasonable control with current therapy 4. D/c plan- ambulated with nursing and was extremely exhausted after about 30-40 ft.  PT to formally evaluate and help determine if will need STR. 5. Severe protein/calorie malnutrition- encourage PO intake; cont to offer supplement  Discussed with pt, family, nursing  Pt is DNR  Curtis SitesBERT J KLEIN III   06/03/2014, 11:03 AM  LOS: 3 days

## 2014-06-03 NOTE — Progress Notes (Signed)
[facility] provided pastoral support/prayer.  Charlett NoseChaplain Jeff [phone]   06/03/14 0900  Clinical Encounter Type  Visited With Patient and family together  Visit Type Spiritual support  Spiritual Encounters  Spiritual Needs Prayer  Stress Factors  Patient Stress Factors None identified  Family Stress Factors None identified  Advance Directives (For Healthcare)  Does patient have an advance directive? Yes

## 2014-06-04 LAB — BASIC METABOLIC PANEL
Anion gap: 7 (ref 5–15)
BUN: 44 mg/dL — ABNORMAL HIGH (ref 6–20)
CO2: 25 mmol/L (ref 22–32)
Calcium: 8.7 mg/dL — ABNORMAL LOW (ref 8.9–10.3)
Chloride: 103 mmol/L (ref 101–111)
Creatinine, Ser: 0.82 mg/dL (ref 0.44–1.00)
Glucose, Bld: 114 mg/dL — ABNORMAL HIGH (ref 65–99)
POTASSIUM: 4.4 mmol/L (ref 3.5–5.1)
Sodium: 135 mmol/L (ref 135–145)

## 2014-06-04 LAB — PROTIME-INR
INR: 2.14
Prothrombin Time: 24.1 seconds — ABNORMAL HIGH (ref 11.4–15.0)

## 2014-06-04 LAB — HEMOGLOBIN: Hemoglobin: 13.1 g/dL (ref 12.0–16.0)

## 2014-06-04 MED ORDER — FUROSEMIDE 80 MG PO TABS: 40.0000 mg | ORAL_TABLET | Freq: Every day | ORAL | Status: DC

## 2014-06-04 MED ORDER — POTASSIUM CHLORIDE CRYS ER 10 MEQ PO TBCR: 10.0000 meq | EXTENDED_RELEASE_TABLET | Freq: Every day | ORAL | Status: DC

## 2014-06-04 NOTE — Progress Notes (Signed)
Ms Laur[facility] Lars[facility]. Her [facility] nurse did not know that Dr Daniel NonesBert Klein wanted Mrs Laurie Larsen to have home health PT and RN which was only mentioned in the Discharge Summary. No Discharge Order requesting home health PT and RN and no Face-To-Face was found in the computer. This Clinical research associatewriter is currently attempting to call Dr Graciela HusbandsKlein to find out whether or not he wants Ms Laurie Larsen to have home health services per no orders written.

## 2014-06-04 NOTE — Discharge Instructions (Signed)

## 2014-06-04 NOTE — Progress Notes (Signed)
ANTICOAGULATION CONSULT NOTE - Follow Up Consult  Pharmacy Consult for warfarin Indication: atrial fibrillation  Allergies  Allergen Reactions  . Amoxicillin Other (See Comments)    Reaction:  Weakness  . Digoxin Other (See Comments)    Reaction:  Weakness and dehydration  . Risedronate Sodium Rash    Patient Measurements: Height: 5\' 3"  (160 cm) Weight: 101 lb 11.2 oz (46.131 kg) IBW/kg (Calculated) : 52.4   Vital Signs: Temp: 97.6 F (36.4 C) (05/08 0411) Temp Source: Oral (05/08 0411) BP: 132/69 mmHg (05/08 0411) Pulse Rate: 70 (05/08 0411)  Labs:  Recent Labs  06/02/14 0455 06/02/14 1326 06/03/14 0519 06/03/14 1224 06/04/14 0510  HGB  --   --  13.0  --   --   HCT  --   --  39.1  --   --   PLT  --   --  225  --   --   LABPROT 25.9*  --   --  25.8* 24.1*  INR 2.36  --   --  2.34 2.14  CREATININE  --  0.84 1.14*  --  0.82    Estimated Creatinine Clearance: 33.8 mL/min (by C-G formula based on Cr of 0.82).   Medications:  Scheduled:  . atenolol  25 mg Oral BID  . atorvastatin  20 mg Oral QHS  . calcium-vitamin D  1 tablet Oral Daily  . diltiazem  180 mg Oral Daily  . dorzolamide-timolol  1 drop Both Eyes BID  . escitalopram  10 mg Oral QHS  . feeding supplement (ENSURE ENLIVE)  237 mL Oral BID BM  . levothyroxine  125 mcg Oral QAC breakfast  . sodium chloride  3 mL Intravenous Q12H  . Travoprost (BAK Free)  1 drop Both Eyes QHS  . Vitamin D (Ergocalciferol)  50,000 Units Oral Weekly  . warfarin  4 mg Oral Daily    Assessment: Patient is an 81 yo female admitted for shortness of breath and found to be hypoxic.  Patient with atrial fibrillation on chronic warfarin therapy.  Per Med Rec, patient takes warfarin 5 mg po daily and holds doses as directed by her physician.    INR History: 5/4 - 3.16, no warfarin given 5/5 - 2.81, warfarin 4 mg given 5/6 - 2.36, warfarin 4 mg given 5/7 - 2.34, warfarin 4 mg given  5/8 - 2.14  Goal of Therapy:  INR  2-3 Monitor INR daily and hemoglobin every 4 days while receiving warfarin.   Plan:  INR is within desired range of 2-3.  Will continue warfarin at lower dose of 4 mg daily.   Will recheck INR with AM labs.   Stormy CardKatsoudas,Earmon Sherrow K, [place] Clinical Pharmacist 06/04/2014

## 2014-06-04 NOTE — Discharge Summary (Signed)
Physician Discharge Summary  Patient ID: Laurie Larsen MRN: [id number] DOB/AGE: Dec 23, 1923 81 y.o.  Admit date: 05/31/2014 Discharge date: 06/04/2014  Admission Diagnoses:  Discharge Diagnoses:  Principal Problem:   CHF exacerbation Active Problems:   Essential hypertension, benign   ATRIAL FIBRILLATION, CHRONIC   CHF (congestive heart failure), NYHA class I   Protein-calorie malnutrition, severe   Discharged Condition: stable  Hospital Course: Admitted with CHF exacerbation, in part due to medication and dietary noncompliance.  Aggressively diuresed, with improvement, however developed significant fatigue.  Attempted to ambulate pt, but only able to go about 30 feet.  Held lasix and losartan, as Cr was slightly elevated.  Cr and BP improved; reambulated pt with RW, and she did much better. Pt felt ready for discharge and [facility].  Sat 90% on RA immediately after ambulating.  Consults: Cardiology   Discharge Exam: Blood pressure 132/69, pulse 70, temperature 97.6 F (36.4 C), temperature source Oral, resp. rate 16, height 5\' 3"  (1.6 m), weight 46.131 kg (101 lb 11.2 oz), SpO2 94 %.on RA   Disposition:   Discharge Instructions    Ambulatory referral to Home Health    Complete by:  As directed   Please evaluate Laurie Lars[facility].  Disciplines requested: HH RN and PT  Services to provide: monitor medication compliance and improve ambulation and endurance  Physician to follow patient's care (the person listed here will be responsible for signing ongoing orders): PCP  Requested Start of Care Date: Tomorrow  I certify that this patient is under my care and that I, or a Nurse Practitioner or Physician's Assistant working with me, had a face-to-face encounter that meets the physician face-to-face requirements with patient on 06/04/14. The encounter with the patient was in whole, or in part for the following medical condition(s)  which is the primary reason for home health care (List medical condition). CHF  Does the patient have Medicare or Medicaid?:  Yes  The encounter with the patient was in whole, or in part, for the following medical condition, which is the primary reason for home health care:  CHF  Reason for Medically Necessary Home Health Services:  Skilled Nursing- Changes in Medication/Medication Management  My clinical findings support the need for the above services:  Shortness of breath with activity  I certify that, based on my findings, the following services are medically necessary home health services:   Nursing Physical therapy    Further, I certify that my clinical findings support that this patient is homebound due to:  Ambulates short distances less than 300 feet     Discharge patient    Complete by:  As directed      Discontinue IV    Complete by:  As directed      Discontinue cardiac monitoring    Complete by:  As directed             Medication List    STOP taking these medications        losartan 25 MG tablet  Commonly known as:  COZAAR      TAKE these medications        atenolol 25 MG tablet  Commonly known as:  TENORMIN  Take 25 mg by mouth 2 (two) times daily.     atorvastatin 40 MG tablet  Commonly known as:  LIPITOR  Take 0.5 tablets (20 mg total) by mouth at bedtime.  calcium-vitamin D 500-200 MG-UNIT per tablet  Commonly known as:  OSCAL WITH D  Take 1 tablet by mouth daily.     COSOPT 22.3-6.8 MG/ML ophthalmic solution  Generic drug:  dorzolamide-timolol  Place 1 drop into both eyes 2 (two) times daily.     diltiazem 180 MG 24 hr capsule  Commonly known as:  CARDIZEM CD  TAKE 1 CAPSULE BY MOUTH DAILY     escitalopram 10 MG tablet  Commonly known as:  LEXAPRO  Take 10 mg by mouth daily.     furosemide 80 MG tablet  Commonly known as:  LASIX  Take 0.5 tablets (40 mg total) by mouth daily.     HYDROcodone-acetaminophen 10-325 MG per tablet  Commonly  known as:  NORCO  Take 1 tablet by mouth 4 (four) times daily as needed for severe pain.     polyethylene glycol packet  Commonly known as:  MIRALAX / GLYCOLAX  Take 17 g by mouth daily as needed for mild constipation.     potassium chloride 10 MEQ tablet  Commonly known as:  K-DUR,KLOR-CON  Take 1 tablet (10 mEq total) by mouth daily.     SYNTHROID 125 MCG tablet  Generic drug:  levothyroxine  TAKE 1 TABLET BY MOUTH DAILY     traMADol 50 MG tablet  Commonly known as:  ULTRAM  Take 50 mg by mouth 3 (three) times daily as needed for moderate pain.     travoprost (benzalkonium) 0.004 % ophthalmic solution  Commonly known as:  TRAVATAN  Place 1 drop into both eyes daily.     Vitamin D (Ergocalciferol) 50000 UNITS Caps capsule  Commonly known as:  DRISDOL  Take 50,000 Units by mouth once a week.     warfarin 5 MG tablet  Commonly known as:  COUMADIN  TAKE AS DIRECTED BY COUMADIN CLINIC           Follow-up Information    [facility].   Specialty:  Internal Medicine   Contact information:   [address] [facility] [place] [address] [phone]      Diet:  Low salt Activity: up as tolerated with walker  Resume oxygen at night  Signed: Curtis SitesBERT J KLEIN III 06/04/2014, 11:48 AM

## 2014-06-04 NOTE — Progress Notes (Signed)
Patient ID: Laurie CobbsMargaret N Forbush, female   DOB: 06-29-1923, 81 y.o.   MRN: [id number]    Patient: Laurie Larsen / Admit Date: 05/31/2014 / Date of Encounter: 06/04/2014, 11:16 AM   Subjective: Breathing is better but she is very weak.  Has not walked today yet. Lasix and losartan held yesterday, creatinine better (BUN still up).    Review of Systems: ROS  Objective: Telemetry: NSR, 70s Physical Exam: Blood pressure 132/69, pulse 70, temperature 97.6 F (36.4 C), temperature source Oral, resp. rate 16, height 5\' 3"  (1.6 m), weight 101 lb 11.2 oz (46.131 kg), SpO2 94 %. Body mass index is 18.02 kg/(m^2). General: Well developed, well nourished, in no acute distress. Head: Normocephalic, atraumatic, sclera non-icteric, no xanthomas, nares are without discharge. Neck: Negative for carotid bruits. JVP not elevated. Lungs: Mild crackles at bases.  Breathing is unlabored. Off O2.  Heart: RRR S1 S2 without murmurs, rubs, or gallops.  Abdomen: Soft, non-tender, non-distended with normoactive bowel sounds. No rebound/guarding. Extremities: No clubbing or cyanosis. No edema. Distal pedal pulses are 2+ and equal bilaterally. Neuro: Alert and oriented X 3. Moves all extremities spontaneously. Psych:  Responds to questions appropriately with a normal affect.   Intake/Output Summary (Last 24 hours) at 06/04/14 1116 Last data filed at 06/04/14 1020  Gross per 24 hour  Intake    480 ml  Output    700 ml  Net   -220 ml    Inpatient Medications:  . atenolol  25 mg Oral BID  . atorvastatin  20 mg Oral QHS  . calcium-vitamin D  1 tablet Oral Daily  . diltiazem  180 mg Oral Daily  . dorzolamide-timolol  1 drop Both Eyes BID  . escitalopram  10 mg Oral QHS  . feeding supplement (ENSURE ENLIVE)  237 mL Oral BID BM  . levothyroxine  125 mcg Oral QAC breakfast  . sodium chloride  3 mL Intravenous Q12H  . Travoprost (BAK Free)  1 drop Both Eyes QHS  . Vitamin D (Ergocalciferol)  50,000 Units Oral Weekly   . warfarin  4 mg Oral Daily   Infusions:    Labs:  Recent Labs  06/03/14 0519 06/04/14 0510  NA 134* 135  K 4.3 4.4  CL 98* 103  CO2 28 25  GLUCOSE 102* 114*  BUN 42* 44*  CREATININE 1.14* 0.82  CALCIUM 8.5* 8.7*   No results for input(s): AST, ALT, ALKPHOS, BILITOT, PROT, ALBUMIN in the last 72 hours.  Recent Labs  06/03/14 0519 06/04/14 0954  WBC 8.8  --   NEUTROABS 6.3  --   HGB 13.0 13.1  HCT 39.1  --   MCV 90.8  --   PLT 225  --    No results for input(s): CKTOTAL, CKMB, TROPONINI in the last 72 hours. Invalid input(s): POCBNP No results for input(s): HGBA1C in the last 72 hours.   Weights: Filed Weights   06/02/14 0535 06/03/14 0551 06/04/14 0411  Weight: 100 lb 14.4 oz (45.768 kg) 100 lb 4.8 oz (45.496 kg) 101 lb 11.2 oz (46.131 kg)     Radiology/Studies:  Dg Chest 2 View  06/03/2014   CLINICAL DATA:  Shortness breath, pleural effusion  EXAM: CHEST - 2 VIEW  COMPARISON:  05/31/2014  FINDINGS: Improved aeration. Small residual right pleural effusion. Minimal residual subsegmental atelectasis or scarring in the lower lobes. Stable cardiomegaly. Atheromatous aorta. Stable left subclavian transvenous pacemaker. Mild wedge compression deformity in the lower thoracic spine, stable since 09/15/2013.  IMPRESSION: 1. Improved aeration since previous portable exam, with a small residual right pleural effusion.   Electronically Signed   By: Corlis Leak  Hassell M.D.   On: 06/03/2014 08:06   Dg Chest Port 1 View  05/31/2014   CLINICAL DATA:  81 year old female with shortness of breath and weakness. Initial encounter.  EXAM: PORTABLE CHEST - 1 VIEW  COMPARISON:  11/06/2013 and earlier.  FINDINGS: Portable AP semi upright view at 1341 hours. Continued bilateral pleural effusions greater on the right, up to moderate. Chronic left chest cardiac pacemaker. Stable cardiomegaly and mediastinal contours. Pulmonary vascularity also appears stable without overt edema. No pneumothorax. No new  pulmonary opacity.  IMPRESSION: Up to moderate bilateral pleural effusions appear stable compared to 2015. No overt pulmonary edema.   Electronically Signed   By: Odessa FlemingH  Hall M.D.   On: 05/31/2014 14:10     Assessment and Plan  1. Acute respiratory distress with hypoxia in the setting of acute on chronic diastolic CHF:  Echo (5/16) with EF 60-65%, PA systolic pressure 65 mmHg.  She has a history of medication noncompliance and dietary indiscretion.  - Restart Lasix 40 mg daily today.   2. Chronic a-fib: Rate controlled - Continue atenolol and diltiazem  - Anticoagulated with coumadin.  3. CAD with known occlusion of LCx: No angina.  -Continue Lipitor -On Coumadin in place of aspirin -No ischemic work up at this time 4. HTN: BP ok, would leave off losartan for now with rise in creatinine.  5. Disposition: Home with home health if she does ok walking today.  If not, probably will need short term rehab.    Marca AnconaDalton Caralynn Gelber 06/04/2014 11:16 AM

## 2014-06-04 NOTE — Progress Notes (Signed)
Discharge instructions along with home medication list and follow up gone over with patient and husband. Both verbalized that they understood instructions. Iv and telemetry removed. Printed rx given to patient. Patient to be discharged home

## 2014-06-04 NOTE — Progress Notes (Signed)
Pt A&Ox4.  VSS.  NSR on tele. Pt having no dyspnea at rest or with exertion.  Pt states she feels very tired.  Pt slept through most of shift.  Per previous shift, pt walked to nurse station and back and was very fatigued afterwards.  No complaints of pain.   Cristela FeltHelen Iris Guidry, RN

## 2014-06-04 NOTE — Progress Notes (Signed)
Still waiting for Dr Graciela HusbandsKlein to respond to my telephone page. Faxed all available discharge information in chart to [facility]. Notified Thalia [facility]Kari Danforth [facility] per no discharge order for home health and no Face-To-Face completed by physician. .[name]

## 2014-06-12 ENCOUNTER — Ambulatory Visit (INDEPENDENT_AMBULATORY_CARE_PROVIDER_SITE_OTHER): Payer: Medicare Other | Admitting: Cardiology

## 2014-06-12 ENCOUNTER — Encounter: Payer: Self-pay | Admitting: Cardiology

## 2014-06-12 VITALS — BP 116/62 | HR 79 | Ht 63.0 in | Wt 104.5 lb

## 2014-06-12 DIAGNOSIS — I5032 Chronic diastolic (congestive) heart failure: Secondary | ICD-10-CM

## 2014-06-12 DIAGNOSIS — I482 Chronic atrial fibrillation, unspecified: Secondary | ICD-10-CM

## 2014-06-12 DIAGNOSIS — Z950 Presence of cardiac pacemaker: Secondary | ICD-10-CM

## 2014-06-12 DIAGNOSIS — I2510 Atherosclerotic heart disease of native coronary artery without angina pectoris: Secondary | ICD-10-CM

## 2014-06-12 DIAGNOSIS — Z7901 Long term (current) use of anticoagulants: Secondary | ICD-10-CM

## 2014-06-12 NOTE — Patient Instructions (Addendum)
We will increase lasix to 60 mg daily  Continue your other therapy  We will see if we can arrange your pacemaker follow up in our [place] office  I will see you in 2 months.

## 2014-06-13 NOTE — Progress Notes (Signed)
CARDIOLOGY OFFICE NOTE  Date:  06/13/2014    Laurie Larsen Date of Birth: [birth year] Medical Record #[id number]  PCP:  [name], MD  Cardiologist:  [place]    Chief Complaint  Patient presents with  . Hospitalization Follow-up    chf     History of Present Illness: Laurie Larsen is a 81 y.o. female who presents today for post hospital follow up.  She has a hx of diastolic HF and chronic R pleural effusion, HLD, pulmonary HTN, chronic atrial fib, chronic coumadin therapy, HTN, CAD with known occlusion of a distal LCX branch from 2005, SSS with PPM in place (followed by Dr. Johney Frame), chronic pain, and hypothyroidism. Did have pneumonia back in July of 2014 as well as right pleural effusion requiring thoracentesis - evaluation suggested a transudative process with some reactive mesothelial cells. Echo done in April 2015 showed normal LV and valvular function with moderate pulmonary HTN.   Admitted to [facility] in August 2015 with exacerbation of diastolic HF. She currently lives at [facility].   Seen in February with increased CHF findings and diuresed as outpatient. Admitted 05/31/14-06/04/14 with acute respiratory failure with hypoxemia and CHF. She was diuresed with excellent result. She became hypotensive and losartan and diuretics were held with improvement. She was DC on lasix 40 mg daily. She admits that she would miss doses of lasix prior to admission due to going to church or just forgetting it. States she is compliant now but worries that 40 mg is not enough. Weight is up 2 lbs compared to DC weight. He breathing is much improved now. No edema. No chest pain.   Past Medical History  Diagnosis Date  . Permanent atrial fibrillation     a. Chronic Coumadin.  [name] HTN (hypertension)   . Chronic diastolic CHF (congestive heart failure), NYHA class 1     a. 04/2013 Echo: EF 50-55%, triv MR, mildly dil LA, PASP , mild TR, mild-mod RA dil.  [name] CAD (coronary artery  disease)     a. 2005 Cath: 100% dLCX-->Med managed.  . Sick sinus syndrome     a. 05/2008 s/p MDT EnRhythm DC PPM ([facility]).  . Chronic pain     a. ? due to arthritis  . Glaucoma   . Hyperlipidemia   . Diverticulitis large intestine   . Allergy   . Phlebitis     a. after pacemeker placement in 2010.  [name] Colon polyps   . Hypothyroidism   . Transfusion history     a. After childbirth 60 years ago (1957)  . Rash/skin eruption     a. Multiple episodes - wide distribution-intermittent, possibly drug rash.  . Pulmonary HTN     a. PASP on echo 04/2013.  [name] Chronic Right Pleural Effusion     a. 07/2012 s/p thoracentesis - Followed by Dr. Delford [facility].    Past Surgical History  Procedure Laterality Date  . Appendectomy    . Abdominal hysterectomy    . Tonsillectomy    . Kidney surgery      Right  . Ankle fracture surgery      Left, pin  . Pacemaker insertion  06/20/08    MDT EnRhythm DR implanted by Dr Reyes Ivan  . Breast surgery      Needle guided excision, right breast calcification  . Breast fibroadenoma surgery  2010    Right  . Skin graft      left leg     Medications:  Current Outpatient Prescriptions  Medication Sig Dispense Refill  . atenolol (TENORMIN) 25 MG tablet Take 25 mg by mouth 2 (two) times daily.    [name]. atorvastatin (LIPITOR) 40 MG tablet Take 0.5 tablets (20 mg total) by mouth at bedtime. 45 tablet 3  . calcium-vitamin D (OSCAL WITH D) 500-200 MG-UNIT per tablet Take 1 tablet by mouth daily.    [name]. diltiazem (CARDIZEM CD) 180 MG 24 hr capsule TAKE 1 CAPSULE BY MOUTH DAILY 90 capsule 1  . dorzolamide-timolol (COSOPT) 22.3-6.8 MG/ML ophthalmic solution Place 1 drop into both eyes 2 (two) times daily.     . furosemide (LASIX) 80 MG tablet Take 0.5 tablets (40 mg total) by mouth daily. 30 tablet 11  . HYDROcodone-acetaminophen (NORCO) 10-325 MG per tablet Take 1 tablet by mouth 4 (four) times daily as needed for severe pain.     . polyethylene  glycol (MIRALAX / GLYCOLAX) packet Take 17 g by mouth daily as needed for mild constipation.    . potassium chloride (K-DUR,KLOR-CON) 10 MEQ tablet Take 1 tablet (10 mEq total) by mouth daily. 30 tablet 6  . SYNTHROID 125 MCG tablet TAKE 1 TABLET BY MOUTH DAILY 90 tablet 1  . traMADol (ULTRAM) 50 MG tablet Take 50 mg by mouth 3 (three) times daily as needed for moderate pain.     [name]. travoprost, benzalkonium, (TRAVATAN) 0.004 % ophthalmic solution Place 1 drop into both eyes daily.     . Vitamin D, Ergocalciferol, (DRISDOL) 50000 UNITS CAPS capsule Take 50,000 Units by mouth once a week.   5  . warfarin (COUMADIN) 5 MG tablet TAKE AS DIRECTED BY COUMADIN CLINIC 40 tablet 3  . [DISCONTINUED] potassium chloride (KLOR-CON 10) 10 MEQ tablet Take 1 tablet (10 mEq total) by mouth 2 (two) times daily. 60 tablet 5   No current facility-administered medications for this visit.    Allergies: Allergies  Allergen Reactions  . Amoxicillin Other (See Comments)    Reaction:  Weakness  . Digoxin Other (See Comments)    Reaction:  Weakness and dehydration  . Risedronate Sodium Rash    Social History: The patient  reports that she has never smoked. She has never used smokeless tobacco. She reports that she does not drink alcohol or use illicit drugs.   Family History: The patient's family history includes Cancer in her cousin, mother, and paternal aunt; Diabetes in her mother; Heart attack in her brother and father; Heart disease in her father; Hyperlipidemia in her father; Hypertension in her father; Stroke in her brother.   Review of Systems: Please see the history of present illness.   Otherwise, the review of systems is positive for dyspnea, leg swelling, hearing loss, visual disturbance, DOE, abdominal pain, back pain, easy bruising and balance issues.   All other systems are reviewed and negative.   Physical Exam: VS:  BP 116/62 mmHg  Pulse 79  Ht 5\' 3"  (1.6 m)  Wt 104 lb 8 oz (47.401 kg)  BMI  18.52 kg/m2 .  BMI Body mass index is 18.52 kg/(m^2).  Wt Readings from Last 3 Encounters:  06/12/14 104 lb 8 oz (47.401 kg)  06/04/14 101 lb 11.2 oz (46.131 kg)  11/02/13 105 lb (47.628 kg)    General: Pleasant. Chronically ill. Quite kyphotic but in no acute distress. Moving slow.  HEENT: Normal. Neck: Supple, no JVD, carotid bruits, or masses noted.  Cardiac: Irregular irregular rhythm. Rate is ok. No murmurs, rubs, or gallops. No edema.  Respiratory:  Lungs  are clear to auscultation bilaterally with normal work of breathing.  GI: Soft and nontender.  MS: No deformity or atrophy. Gait and ROM intact.Using a cane.  Skin: Warm and dry. Color is normal.  Neuro:  Strength and sensation are intact and no gross focal deficits noted.  Psych: Alert, appropriate and with normal affect.   LABORATORY DATA:  EKG:  EKG is not ordered today.   Lab Results  Component Value Date   WBC 8.8 06/03/2014   HGB 13.1 06/04/2014   HCT 39.1 06/03/2014   PLT 225 06/03/2014   GLUCOSE 114* 06/04/2014   CHOL 117 06/01/2014   TRIG 68 06/01/2014   HDL 43 06/01/2014   LDLCALC 60 06/01/2014   ALT 23 05/31/2014   AST 30 05/31/2014   NA 135 06/04/2014   K 4.4 06/04/2014   CL 103 06/04/2014   CREATININE 0.82 06/04/2014   BUN 44* 06/04/2014   CO2 25 06/04/2014   TSH 1.52 03/23/2013   INR 2.14 06/04/2014    BNP (last 3 results)  Recent Labs  05/31/14 1255  BNP 1124.0*    ProBNP (last 3 results)  Recent Labs  11/10/13 1228 03/10/14 1014 03/21/14 1500  PROBNP 418.0* 925.0* 844.0*     Other Studies Reviewed Today:  Echo Study Conclusions from April 2015 - Left ventricle: The cavity size was normal. There was mild concentric hypertrophy. Systolic function was normal. The estimated ejection fraction was in the range of 50% to 55%. Wall motion was normal; there were no regional wall motion abnormalities. The study was not technically sufficient to allow evaluation of LV  diastolic dysfunction due to atrial fibrillation. - Aortic valve: No regurgitation. - Aortic root: The aortic root was normal in size. - Mitral valve: Calcified annulus. Mildly thickened leaflets . Trivial regurgitation. - Left atrium: The atrium was mildly dilated. - Right ventricle: Systolic function was mildly reduced. - Right atrium: The atrium was mildly to moderately dilated. Pacer wire or catheter noted in right atrium. - Tricuspid valve: Mild regurgitation. - Pulmonary arteries: Systolic pressure was mildly to moderately increased. PA peak pressure: 48mm Hg (S). - Pericardium, extracardiac: There was no pericardial effusion.  CXR personally reviewed from 06/03/14. Edema resolved. Significant improvement in bilateral pleural effusions.   Assessment/Plan: 1. Chronic diastolic HF with recent exacerbation - treated with additional diuresis with improvement - her weight is back up by 2 lbs- will increase her diuretic to 60 mg daily. She understands to take extra lasix if weight increases or she has increased dyspnea. Will follow up in 2 months.   2. Chronic atrial fibrillation: Rate controlled. Continue coumadin which she is tolerating.  3. Pulmonary HTN  4. Essential hypertension, benign: BP controlled on current regimen which includes Atenolol, Diltiazem. Leave off  Losartan for now.   5. 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. On last check battery is getting closer to elective replacement and repeat battery check recommended. Will see if EP can arrange for her follow up in [place] office.   6. CAD - continue with medical management  7. HLD (hyperlipidemia): on statin therapy  Current medicines are reviewed with the patient today.  The patient does not have concerns regarding medicines other than what has been noted  above.  The following changes have been made:  See above.  Labs/ tests ordered today include:  No orders of the defined types were placed in this encounter.    Disposition:   FU with Dr. [place] 2 months  Patient is agreeable to this plan and will call if any problems develop in the interim.   Signed: Lon Klippel [place] MD, [facility]    06/13/2014 8:31 AM

## 2014-06-19 ENCOUNTER — Ambulatory Visit (INDEPENDENT_AMBULATORY_CARE_PROVIDER_SITE_OTHER): Payer: Medicare Other | Admitting: Critical Care Medicine

## 2014-06-19 ENCOUNTER — Encounter: Payer: Self-pay | Admitting: Critical Care Medicine

## 2014-06-19 VITALS — BP 104/68 | HR 84 | Temp 97.4°F | Ht 62.0 in | Wt 103.0 lb

## 2014-06-19 DIAGNOSIS — G4734 Idiopathic sleep related nonobstructive alveolar hypoventilation: Secondary | ICD-10-CM | POA: Diagnosis not present

## 2014-06-19 DIAGNOSIS — R0602 Shortness of breath: Secondary | ICD-10-CM | POA: Diagnosis not present

## 2014-06-19 DIAGNOSIS — I270 Primary pulmonary hypertension: Secondary | ICD-10-CM

## 2014-06-19 DIAGNOSIS — J90 Pleural effusion, not elsewhere classified: Secondary | ICD-10-CM

## 2014-06-19 DIAGNOSIS — I272 Pulmonary hypertension, unspecified: Secondary | ICD-10-CM

## 2014-06-19 DIAGNOSIS — I2510 Atherosclerotic heart disease of native coronary artery without angina pectoris: Secondary | ICD-10-CM | POA: Diagnosis not present

## 2014-06-19 NOTE — Assessment & Plan Note (Signed)
History of nocturnal hypoxemia The patient is benefiting from oxygen therapy Plan Repeat overnight oximetry on room air to validate need for oxygen at night Continue 2 L oxygen every night ongoing

## 2014-06-19 NOTE — Progress Notes (Signed)
Subjective:    Patient ID: Laurie Larsen, female    DOB: May 07, 1923, 81 y.o.   MRN: [id number]  HPI 06/19/2014 Chief Complaint  Patient presents with  . Follow-up    Pt needs recertification for O2. Pt states wearing O2 every night. Pt c/o worsening SOB without activity. States that O2 sats have been below 88% a couple of times at home.     Pt with CHF and just adm for same [facility].  Rx lasix and no need for thoracentesis .  Still with cough and dyspnea but is better compared to when in hospital.  No real wheeze.  Needs recert on oxygen .  Pt denies any significant sore throat, nasal congestion or excess secretions, fever, chills, sweats, unintended weight loss, pleurtic or exertional chest pain, orthopnea PND, or leg swelling Pt denies any increase in rescue therapy over baseline, denies waking up needing it or having any early am or nocturnal exacerbations of coughing/wheezing/or dyspnea. Pt also denies any obvious fluctuation in symptoms with  weather or environmental change or other alleviating or aggravating factors  Wt Readings from Last 3 Encounters:  06/19/14 103 lb (46.72 kg)  06/12/14 104 lb 8 oz (47.401 kg)  06/04/14 101 lb 11.2 oz (46.131 kg)     Current Medications, Allergies, Complete Past Medical History, Past Surgical History, Family History, and Social History were reviewed in [facility] electronic medical record.  Past Medical History  Diagnosis Date  . Permanent atrial fibrillation     a. Chronic Coumadin.  [name]. HTN (hypertension)   . Chronic diastolic CHF (congestive heart failure), NYHA class 1     a. 04/2013 Echo: EF 50-55%, triv MR, mildly dil LA, PASP 48mmHg, mild TR, mild-mod RA dil.  [name]. CAD (coronary artery disease)     a. 2005 Cath: 100% dLCX-->Med managed.  . Sick sinus syndrome     a. 05/2008 s/p MDT EnRhythm DC PPM (Allred).  . Chronic pain     a. ? due to arthritis  . Glaucoma   . Hyperlipidemia   . Diverticulitis large intestine   .  Allergy   . Phlebitis     a. after pacemeker placement in 2010.  [name]. Colon polyps   . Hypothyroidism   . Transfusion history     a. After childbirth 60 years ago (1957)  . Rash/skin eruption     a. Multiple episodes - wide distribution-intermittent, possibly drug rash.  . Pulmonary HTN     a. PASP 48mmHg on echo 04/2013.  [name]. Chronic Right Pleural Effusion     a. 07/2012 s/p thoracentesis - [facility].     Family History  Problem Relation Age of Onset  . Cancer Mother     colon with mets  . Diabetes Mother   . Heart disease Father   . Hyperlipidemia Father   . Hypertension Father   . Stroke Brother     brother who died '11  . Cancer Paternal Aunt     breast  . Cancer Cousin     uterine, lung cancer  . Heart attack Brother   . Heart attack Father      History   Social History  . Marital Status: Married    Spouse Name: N/A  . Number of Children: 3  . Years of Education: 13   Occupational History  . RETIRED    Social History Main Topics  . Smoking status: Never Smoker   . Smokeless  tobacco: Never Used  . Alcohol Use: No  . Drug Use: No  . Sexual Activity: No   Other Topics Concern  . 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  Married 1948. 3 sons - 06/13/50, 06-13-2055, 12-Jun-2056; 5 grandchildren-one deceased -  OD @ 18.  Lives alone with husband and they are independent in ADLs.  End of Life Care: no prolonged heroic measures, i.e. Artificial feeding or hydration; DNR; no prolonged intubation.     Allergies  Allergen Reactions  . Amoxicillin Other (See Comments)    Reaction:  Weakness  . Digoxin Other (See Comments)    Reaction:  Weakness and dehydration  . Risedronate Sodium Rash     Outpatient Prescriptions Prior to Visit  Medication Sig Dispense Refill  . atenolol (TENORMIN) 25 MG tablet Take 25 mg by mouth 2 (two) times daily.    [name]  atorvastatin (LIPITOR) 40 MG tablet Take 0.5 tablets (20 mg total) by mouth at bedtime. 45 tablet 3  . calcium-vitamin D (OSCAL WITH D) 500-200 MG-UNIT per tablet Take 1 tablet by mouth daily.    [name] diltiazem (CARDIZEM CD) 180 MG 24 hr capsule TAKE 1 CAPSULE BY MOUTH DAILY 90 capsule 1  . dorzolamide-timolol (COSOPT) 22.3-6.8 MG/ML ophthalmic solution Place 1 drop into both eyes 2 (two) times daily.     . furosemide (LASIX) 80 MG tablet Take 0.5 tablets (40 mg total) by mouth daily. 30 tablet 11  . HYDROcodone-acetaminophen (NORCO) 10-325 MG per tablet Take 1 tablet by mouth 4 (four) times daily as needed for severe pain.     . polyethylene glycol (MIRALAX / GLYCOLAX) packet Take 17 g by mouth daily as needed for mild constipation.    . potassium chloride (K-DUR,KLOR-CON) 10 MEQ tablet Take 1 tablet (10 mEq total) by mouth daily. 30 tablet 6  . SYNTHROID 125 MCG tablet TAKE 1 TABLET BY MOUTH DAILY 90 tablet 1  . traMADol (ULTRAM) 50 MG tablet Take 50 mg by mouth 3 (three) times daily as needed for moderate pain.     [name] travoprost, benzalkonium, (TRAVATAN) 0.004 % ophthalmic solution Place 1 drop into both eyes daily.     . Vitamin D, Ergocalciferol, (DRISDOL) 50000 UNITS CAPS capsule Take 50,000 Units by mouth once a week.   5  . warfarin (COUMADIN) 5 MG tablet TAKE AS DIRECTED BY COUMADIN CLINIC 40 tablet 3   No facility-administered medications prior to visit.    Review of Systems  Constitutional: Negative for fever, chills, diaphoresis, appetite change, fatigue and unexpected weight change.  HENT: Negative for congestion, ear discharge, ear pain, hearing loss, nosebleeds, postnasal drip, rhinorrhea, sinus pressure, sneezing, sore throat, trouble swallowing and voice change.   Eyes: Negative for discharge and itching.  Respiratory: Negative for apnea, cough, choking, chest tightness, shortness of breath, wheezing and stridor.   Cardiovascular: Negative for chest pain, palpitations and leg  swelling.  Gastrointestinal: Negative for nausea, vomiting, abdominal pain and abdominal distention.  Endocrine: Negative.   Genitourinary: Negative.   Musculoskeletal: Negative for myalgias, joint swelling and arthralgias.  Skin: Negative for rash.  Allergic/Immunologic: Negative for environmental allergies.  Neurological: Negative for dizziness, syncope, weakness and headaches.  Hematological: Negative for adenopathy. Does not bruise/bleed easily.  Psychiatric/Behavioral: Negative for sleep disturbance and agitation. The patient is not nervous/anxious.        Objective:   Physical  Exam  Constitutional: She is oriented to person, place, and time. She is active.  Thin elderly F  HENT:  Head: Normocephalic and atraumatic.  Nose: No mucosal edema, rhinorrhea, sinus tenderness, nasal deformity or septal deviation. No epistaxis. Right sinus exhibits no maxillary sinus tenderness and no frontal sinus tenderness. Left sinus exhibits no maxillary sinus tenderness and no frontal sinus tenderness.  Mouth/Throat: Oropharynx is clear and moist. No oropharyngeal exudate.  Eyes: Conjunctivae and EOM are normal. Pupils are equal, round, and reactive to light. No scleral icterus.  Neck: Trachea normal and normal range of motion. Neck supple. No JVD present. No tracheal tenderness and no muscular tenderness present. Carotid bruit is not present. No rigidity. No tracheal deviation, no edema, no erythema and normal range of motion present. No thyromegaly present.  Cardiovascular: Normal rate, regular rhythm, S1 normal, S2 normal, normal heart sounds, intact distal pulses and normal pulses.  PMI is not displaced.  Exam reveals no gallop, no S3, no S4, no distant heart sounds and no friction rub.   No murmur heard.  No systolic murmur is present   No diastolic murmur is present  Pulmonary/Chest: Effort normal and breath sounds normal. No accessory muscle usage or stridor. No apnea and no tachypnea. No  respiratory distress. She has no decreased breath sounds. She has no wheezes. She has no rhonchi. She has no rales. Chest wall is not dull to percussion. She exhibits no mass, no tenderness, no bony tenderness and no deformity.  Abdominal: Soft. Normal appearance and bowel sounds are normal. She exhibits no distension and no ascites. There is no hepatosplenomegaly. There is no tenderness. There is no rigidity, no rebound and no guarding.  Musculoskeletal: Normal range of motion.  Lymphadenopathy:       Head (right side): No submental and no submandibular adenopathy present.       Head (left side): No submental and no submandibular adenopathy present.    She has no cervical adenopathy.  Neurological: She is alert and oriented to person, place, and time. She has normal strength. No sensory deficit.  Skin: Skin is warm and dry. No rash noted. She is not diaphoretic. No pallor. Nails show no clubbing.  Psychiatric: She has a normal mood and affect. Her speech is normal and behavior is normal.  Vitals reviewed.         Assessment & Plan:  I personally reviewed all images and lab data in the [facility] system as well as any outside material available during this office visit and agree with the  radiology impressions.  I also have reviewed any data /notes/records if available in care everywhere.  Pleural effusion Recurrent right pleural effusion that has resolved with diuresis with recent acute congestive heart failure Plan Maintain current cardiac medications and current diuretic program No indication for thoracentesis at this time    Nocturnal hypoxemia History of nocturnal hypoxemia The patient is benefiting from oxygen therapy Plan Repeat overnight oximetry on room air to validate need for oxygen at night Continue 2 L oxygen every night ongoing    Chenay was seen today for follow-up.  Diagnoses and all orders for this visit:  Pulmonary HTN  Shortness of breath Orders: -     Pulse  oximetry, overnight; Future  Pleural effusion  Nocturnal hypoxemia    I

## 2014-06-19 NOTE — Assessment & Plan Note (Signed)
Recurrent right pleural effusion that has resolved with diuresis with recent acute congestive heart failure Plan Maintain current cardiac medications and current diuretic program No indication for thoracentesis at this time

## 2014-06-19 NOTE — Patient Instructions (Signed)
An overnight oxygen test on Room air will be obtained No change in medications Return 6 months

## 2014-06-20 LAB — PROTIME-INR: INR: 2.3 — AB (ref 0.9–1.1)

## 2014-06-21 ENCOUNTER — Ambulatory Visit (INDEPENDENT_AMBULATORY_CARE_PROVIDER_SITE_OTHER): Payer: Medicare Other | Admitting: Cardiovascular Disease

## 2014-06-21 DIAGNOSIS — I482 Chronic atrial fibrillation, unspecified: Secondary | ICD-10-CM

## 2014-06-22 ENCOUNTER — Ambulatory Visit: Payer: BLUE CROSS/BLUE SHIELD | Admitting: Family

## 2014-06-27 ENCOUNTER — Encounter: Payer: Self-pay | Admitting: Internal Medicine

## 2014-06-27 ENCOUNTER — Telehealth: Payer: Self-pay | Admitting: Cardiology

## 2014-06-27 ENCOUNTER — Ambulatory Visit (INDEPENDENT_AMBULATORY_CARE_PROVIDER_SITE_OTHER): Payer: Medicare Other | Admitting: Internal Medicine

## 2014-06-27 VITALS — BP 160/84 | HR 82 | Ht 62.0 in | Wt 103.5 lb

## 2014-06-27 DIAGNOSIS — I482 Chronic atrial fibrillation: Secondary | ICD-10-CM

## 2014-06-27 DIAGNOSIS — I509 Heart failure, unspecified: Secondary | ICD-10-CM

## 2014-06-27 DIAGNOSIS — I2510 Atherosclerotic heart disease of native coronary artery without angina pectoris: Secondary | ICD-10-CM | POA: Diagnosis not present

## 2014-06-27 DIAGNOSIS — I5030 Unspecified diastolic (congestive) heart failure: Secondary | ICD-10-CM

## 2014-06-27 DIAGNOSIS — I480 Paroxysmal atrial fibrillation: Secondary | ICD-10-CM | POA: Diagnosis not present

## 2014-06-27 DIAGNOSIS — Z45010 Encounter for checking and testing of cardiac pacemaker pulse generator [battery]: Secondary | ICD-10-CM | POA: Diagnosis not present

## 2014-06-27 DIAGNOSIS — I4821 Permanent atrial fibrillation: Secondary | ICD-10-CM

## 2014-06-27 LAB — CUP PACEART INCLINIC DEVICE CHECK
Battery Voltage: 2.86 V
Brady Statistic RV Percent Paced: 68.16 %
Date Time Interrogation Session: 20160531120204
Lead Channel Impedance Value: 408 Ohm
Lead Channel Impedance Value: 464 Ohm
Lead Channel Pacing Threshold Amplitude: 1 V
Lead Channel Sensing Intrinsic Amplitude: 0.8 mV
Lead Channel Sensing Intrinsic Amplitude: 6.9 mV
Lead Channel Setting Pacing Amplitude: 2.5 V
Lead Channel Setting Sensing Sensitivity: 0.9 mV
MDC IDC MSMT LEADCHNL RV PACING THRESHOLD PULSEWIDTH: 0.4 ms
MDC IDC SET LEADCHNL RV PACING PULSEWIDTH: 0.4 ms
Zone Setting Detection Interval: 350 ms
Zone Setting Detection Interval: 400 ms

## 2014-06-27 MED ORDER — FUROSEMIDE 40 MG PO TABS: 40.0000 mg | ORAL_TABLET | Freq: Every day | ORAL | Status: DC

## 2014-06-27 MED ORDER — ATENOLOL 50 MG PO TABS: 50.0000 mg | ORAL_TABLET | Freq: Every day | ORAL | Status: DC

## 2014-06-27 NOTE — Progress Notes (Signed)
Patient Care Team: [name], MD as PCP - General (Internal Medicine) Peter M Swaziland, MD (Cardiology) Hillis Range, MD (Cardiology) Sharrell Ku, MD (Gastroenterology) Melvenia Needles, MD (Ophthalmology) Cherlyn Roberts, MD (Dermatology) Sheran Luz, MD (Physical Medicine and Rehabilitation) Catha Gosselin, MD as Attending Physician (Family Medicine)   HPI  Laurie Larsen is a 81 y.o. female Seen with a previously implanted pacemaker in the context of permanent atrial fibrillation. . She saw Dr. Fawn Kirk 3/16. One-year follow-up was recommended. It was her desire to have pacemaker follow-up closer to home in [place].  She was hospitalized 5/16 [facility].  These records were reviewed. Echocardiogram 5/16 demonstrated normal ejection fraction she was diuresed and rate control was continued.  She continues with complaints of shortness of breath. She has some problems with peripheral edema and she adjusts her diuretics on an as-needed basis going from 60--80.  She has known coronary artery disease with occlusion of her circumflex.   She tells me she has been married for 68 years.    Past Medical History  Diagnosis Date  . Permanent atrial fibrillation     a. Chronic Coumadin.  [name] HTN (hypertension)   . Chronic diastolic CHF (congestive heart failure), NYHA class 1     a. 04/2013 Echo: EF 50-55%, triv MR, mildly dil LA, PASP , mild TR, mild-mod RA dil.  [name] CAD (coronary artery disease)     a. 2005 Cath: 100% dLCX-->Med managed.  . Sick sinus syndrome     a. 05/2008 s/p MDT EnRhythm DC PPM (Allred).  . Chronic pain     a. ? due to arthritis  . Glaucoma   . Hyperlipidemia   . Diverticulitis large intestine   . Allergy   . Phlebitis     a. after pacemeker placement in 2010.  [name] Colon polyps   . Hypothyroidism   . Transfusion history     a. After childbirth 60 years ago (1957)  . Rash/skin eruption     a. Multiple episodes - wide distribution-intermittent, possibly  drug rash.  . Pulmonary HTN     a. PASP on echo 04/2013.  [name] Chronic Right Pleural Effusion     a. 07/2012 s/p thoracentesis - Followed by Dr. Delford Field [facility].    Past Surgical History  Procedure Laterality Date  . Appendectomy    . Abdominal hysterectomy    . Tonsillectomy    . Kidney surgery      Right  . Ankle fracture surgery      Left, pin  . Pacemaker insertion  06/20/08    MDT EnRhythm DR implanted by Dr Reyes Ivan  . Breast surgery      Needle guided excision, right breast calcification  . Breast fibroadenoma surgery  2010    Right  . Skin graft      left leg    Current Outpatient Prescriptions  Medication Sig Dispense Refill  . atenolol (TENORMIN) 25 MG tablet Take 25 mg by mouth 2 (two) times daily.    [name] atorvastatin (LIPITOR) 40 MG tablet Take 0.5 tablets (20 mg total) by mouth at bedtime. 45 tablet 3  . calcium-vitamin D (OSCAL WITH D) 500-200 MG-UNIT per tablet Take 1 tablet by mouth daily.    [name] diltiazem (CARDIZEM CD) 180 MG 24 hr capsule TAKE 1 CAPSULE BY MOUTH DAILY 90 capsule 1  . dorzolamide-timolol (COSOPT) 22.3-6.8 MG/ML ophthalmic solution Place 1 drop into both eyes 2 (two) times daily.     [name]  furosemide (LASIX) 80 MG tablet Take 0.5 tablets (40 mg total) by mouth daily. (Patient taking differently: Take 60 mg by mouth daily. ) 30 tablet 11  . HYDROcodone-acetaminophen (NORCO) 10-325 MG per tablet Take 1 tablet by mouth 4 (four) times daily as needed for severe pain.     . polyethylene glycol (MIRALAX / GLYCOLAX) packet Take 17 g by mouth daily as needed for mild constipation.    . potassium chloride (K-DUR,KLOR-CON) 10 MEQ tablet Take 1 tablet (10 mEq total) by mouth daily. 30 tablet 6  . SYNTHROID 125 MCG tablet TAKE 1 TABLET BY MOUTH DAILY 90 tablet 1  . traMADol (ULTRAM) 50 MG tablet Take 50 mg by mouth 3 (three) times daily as needed for moderate pain.     [name]. travoprost, benzalkonium, (TRAVATAN) 0.004 % ophthalmic solution Place 1 drop into  both eyes daily.     . Vitamin D, Ergocalciferol, (DRISDOL) 50000 UNITS CAPS capsule Take 50,000 Units by mouth once a week.   5  . warfarin (COUMADIN) 5 MG tablet TAKE AS DIRECTED BY COUMADIN CLINIC 40 tablet 3  . [DISCONTINUED] potassium chloride (KLOR-CON 10) 10 MEQ tablet Take 1 tablet (10 mEq total) by mouth 2 (two) times daily. 60 tablet 5   No current facility-administered medications for this visit.    Allergies  Allergen Reactions  . Amoxicillin Other (See Comments)    Reaction:  Weakness  . Digoxin Other (See Comments)    Reaction:  Weakness and dehydration  . Risedronate Sodium Rash    Review of Systems negative except from HPI and PMH  Physical Exam BP 160/84 mmHg  Pulse 82  Ht 5\' 2"  (1.575 m)  Wt 103 lb 8 oz (46.947 kg)  BMI 18.93 kg/m2 Well developed and well nourished in no acute distress HENT normal E scleral and icterus clear Neck Supple JVP flat; carotids brisk and full Clear to ausculation Device pocket well healed; without hematoma or erythema.  There is no tethering  Irregularly irregular rate and rhythm, no murmurs gallops or rub Soft with active bowel sounds No clubbing cyanosis Trace Edema Alert and oriented, grossly normal motor and sensory function Skin Warm and Dry  ECG 4/16 demonstrated atrial fibrillation with a reasonably controlled ventricular response of 81  Assessment and  Plan  Atrial fibrillation-permanent  Bradycardia  Pacemaker-Medtronic-single chamber The patient's device was interrogated.  The information was reviewed. No changes were made in the programming.     HFpEF  Hypertension  The patient's blood pressure is elevated today. She says at home and is normally in the 1:30 range. Hence, I'm reluctant to increase her rate controlling medications; furthermore, in-hospital was actually low.  I will however, in the absence of lightheadedness, change her atenolol from 25 twice daily--50 daily in the hopes that some of her  symptoms might be attributable to episodes of rapid atrial fibrillation which are identified by device interrogation. Peak heart rates are recorded 150 range.  Furthermore, in the fact that she has had problems with edema and shortness of breath, we will increase her Lasix from 60 daily--80 3 times a week and 60 the rest of the week. I will ask that [facility] [place] [facility] [place] [facility] [place] for reassessment of heart failure status and metabolic profile.  Renal function and potassium levels were normal 06/04/14.  We also discussed the possibility   on reprogramming her device lower rate limit so as to assess the need for backup ventricular pacing at this  point. She has expressed reluctance to undergo device generator replacement. However, she would like to leave things as they are.

## 2014-06-27 NOTE — Patient Instructions (Addendum)
Medication Instructions:  Your physician has recommended you make the following change in your medication:  1) START taking atenolol 50mg  every morning 2) START taking lasix 60mg  -every other day 3) START taking lasix 80mg  - on days you do not take 60mg  lasix   Labwork: None  Testing/Procedures: None  Follow-Up: Your physician recommends that you schedule a follow-up appointment in: three weeks with Lyn[place]   Any Other Special Instructions Will Be Listed Below (If Applicable).  Your physician recommends that you schedule a follow-up appointment in: two months for a battery check

## 2014-06-27 NOTE — Telephone Encounter (Signed)
Pt called in wanting to speak with Dr. Elvis CoilJordan's nurse about another doctor prescribing her medications that Dr. SwazilandJordan is currently prescribing . Please call  Thanks

## 2014-06-27 NOTE — Telephone Encounter (Signed)
Returned call to patient no answer.LMTC. 

## 2014-06-28 NOTE — Telephone Encounter (Signed)
Returned call to patient.Stated [facility] [place] [facility].Stated her B/P was elevated and Dr.Klein changed how she takes Atenolol and Lasix.Stated she was surprised B/P elevated.Stated she has been under a lot of stress with selling her home.Stated [facility] she had nurse to recheck B/P.B/P in both arms 134/73.Stated she would like Dr.Jordan to know before she changes medications.Advised Dr.Jordan out of office today will speak to him 06/29/14 and call her back.

## 2014-06-29 ENCOUNTER — Other Ambulatory Visit: Payer: Self-pay

## 2014-06-29 MED ORDER — DILTIAZEM HCL ER COATED BEADS 180 MG PO CP24: 180.0000 mg | ORAL_CAPSULE | Freq: Every day | ORAL | Status: DC

## 2014-06-29 NOTE — Telephone Encounter (Signed)
Per note 5.16.16 

## 2014-06-30 NOTE — Telephone Encounter (Signed)
Returned call to patient no answer.LMTC. 

## 2014-06-30 NOTE — Telephone Encounter (Signed)
Returning your call. °

## 2014-07-04 ENCOUNTER — Encounter: Payer: Self-pay | Admitting: Internal Medicine

## 2014-07-05 LAB — PROTIME-INR: INR: 4 — AB (ref 0.9–1.1)

## 2014-07-06 ENCOUNTER — Telehealth: Payer: Self-pay | Admitting: Critical Care Medicine

## 2014-07-06 ENCOUNTER — Ambulatory Visit (INDEPENDENT_AMBULATORY_CARE_PROVIDER_SITE_OTHER): Payer: Medicare Other | Admitting: Cardiovascular Disease

## 2014-07-06 DIAGNOSIS — G4734 Idiopathic sleep related nonobstructive alveolar hypoventilation: Secondary | ICD-10-CM

## 2014-07-06 DIAGNOSIS — I480 Paroxysmal atrial fibrillation: Secondary | ICD-10-CM

## 2014-07-06 NOTE — Telephone Encounter (Signed)
Follow up        Pt returning [place] call

## 2014-07-06 NOTE — Telephone Encounter (Signed)
Let pt know ONO on RA still abn,  Needs to stay on oxygen 2L qhs

## 2014-07-06 NOTE — Telephone Encounter (Signed)
Returned call to patient she stated B/P has been good ranging 125/70,128/75.Dr.Jordan advised to continue same medications.She stated someone cancelled her July appointment with Dr.Jordan and moved it to 09/18/14.Appointment rescheduled with Dr.Jordan 08/10/14 at 11:45 am.Advised to bring B/P readings.

## 2014-07-06 NOTE — Telephone Encounter (Signed)
lmtcb

## 2014-07-07 NOTE — Telephone Encounter (Signed)
Spoke with pt, she is aware of results and recs.  Nothing further needed. 

## 2014-07-07 NOTE — Telephone Encounter (Signed)
Pt returning call.Laurie Larsen ° °

## 2014-07-12 ENCOUNTER — Telehealth: Payer: Self-pay | Admitting: Cardiology

## 2014-07-12 MED ORDER — ATENOLOL 25 MG PO TABS: 25.0000 mg | ORAL_TABLET | Freq: Every day | ORAL | Status: DC

## 2014-07-12 NOTE — Telephone Encounter (Signed)
Spoke to pharmacist - [facility] pharmacist states patient prescription was changed back to the original 25 mg daily ,per patient. RN reviewed patient's chl chart-   Telephone message on 06/09/14 addresses patient's medication change and medication was change from 50 mg to 25 mg. E-sent new prescription to pharmacy   Notified pharmacist and  Left message on  Patient's voice mail.

## 2014-07-12 NOTE — Telephone Encounter (Signed)
New message      Need new presc of atenolol 25mg  called in to pharmacy.  Pharmacy said presc had expired and need a new presc

## 2014-07-13 ENCOUNTER — Ambulatory Visit (INDEPENDENT_AMBULATORY_CARE_PROVIDER_SITE_OTHER): Payer: Medicare Other | Admitting: Internal Medicine

## 2014-07-13 ENCOUNTER — Telehealth: Payer: Self-pay | Admitting: Critical Care Medicine

## 2014-07-13 ENCOUNTER — Encounter: Payer: Self-pay | Admitting: Internal Medicine

## 2014-07-13 DIAGNOSIS — I480 Paroxysmal atrial fibrillation: Secondary | ICD-10-CM

## 2014-07-13 LAB — PROTIME-INR: INR: 1.6 — AB (ref 0.9–1.1)

## 2014-07-13 NOTE — Telephone Encounter (Signed)
Crystal, please advise if you have seen this ono.

## 2014-07-14 NOTE — Telephone Encounter (Signed)
Per phone msg forr 07/06/14, pt has addressed ONO results.  Pls inform APS.

## 2014-07-14 NOTE — Telephone Encounter (Signed)
Spoke with APS-Kim, aware that we have received ONO results and have reviewed these with the patient.  Nothing further needed. Laurie Frisk, MD at 07/06/2014 2:44 PM     Status: Signed       Expand All Collapse All   Let pt know ONO on RA still abn,  Needs to stay on oxygen 2L qhs

## 2014-07-14 NOTE — Telephone Encounter (Signed)
Crystal - did you receive the ONO report on this patient?  I see the order in the system, but do not see the results. Please advise.

## 2014-07-24 ENCOUNTER — Encounter: Payer: Self-pay | Admitting: Critical Care Medicine

## 2014-07-26 LAB — PROTIME-INR: INR: 1.8 — AB (ref 0.9–1.1)

## 2014-07-27 ENCOUNTER — Ambulatory Visit (INDEPENDENT_AMBULATORY_CARE_PROVIDER_SITE_OTHER): Payer: Medicare Other | Admitting: Internal Medicine

## 2014-07-27 DIAGNOSIS — I480 Paroxysmal atrial fibrillation: Secondary | ICD-10-CM

## 2014-08-03 ENCOUNTER — Other Ambulatory Visit: Payer: Self-pay | Admitting: Cardiology

## 2014-08-03 ENCOUNTER — Other Ambulatory Visit: Payer: Self-pay

## 2014-08-03 MED ORDER — ATENOLOL 25 MG PO TABS: 25.0000 mg | ORAL_TABLET | Freq: Every day | ORAL | Status: DC

## 2014-08-03 MED ORDER — WARFARIN SODIUM 5 MG PO TABS: ORAL_TABLET | ORAL | Status: DC

## 2014-08-03 NOTE — Telephone Encounter (Signed)
°  1. Which medications need to be refilled? Atenolol and Warfrin  2. Which pharmacy is medication to be sent to?[facility]-[phone]  3. Do they need a 30 day or 90 day supply? 90 and refills  4. Would they like a call back once the medication has been sent to the pharmacy? yes

## 2014-08-03 NOTE — Telephone Encounter (Signed)
Pt aware Rx were sent.

## 2014-08-04 ENCOUNTER — Ambulatory Visit: Payer: BLUE CROSS/BLUE SHIELD | Admitting: Cardiology

## 2014-08-04 ENCOUNTER — Ambulatory Visit: Payer: Medicare Other | Admitting: Cardiology

## 2014-08-09 LAB — PROTIME-INR: INR: 2.3 — AB (ref 0.9–1.1)

## 2014-08-10 ENCOUNTER — Encounter: Payer: Self-pay | Admitting: Cardiology

## 2014-08-10 ENCOUNTER — Ambulatory Visit (INDEPENDENT_AMBULATORY_CARE_PROVIDER_SITE_OTHER): Payer: Medicare Other | Admitting: Cardiology

## 2014-08-10 VITALS — BP 110/78 | HR 76 | Ht 64.0 in | Wt 102.7 lb

## 2014-08-10 DIAGNOSIS — I5032 Chronic diastolic (congestive) heart failure: Secondary | ICD-10-CM | POA: Diagnosis not present

## 2014-08-10 DIAGNOSIS — I480 Paroxysmal atrial fibrillation: Secondary | ICD-10-CM

## 2014-08-10 DIAGNOSIS — I2510 Atherosclerotic heart disease of native coronary artery without angina pectoris: Secondary | ICD-10-CM | POA: Diagnosis not present

## 2014-08-10 DIAGNOSIS — I10 Essential (primary) hypertension: Secondary | ICD-10-CM

## 2014-08-10 DIAGNOSIS — Z950 Presence of cardiac pacemaker: Secondary | ICD-10-CM

## 2014-08-10 NOTE — Patient Instructions (Signed)
Continue your current therapy and use extra lasix as needed for weight gain or increased swelling or shortness of breath.  I will see you in 3 months.

## 2014-08-10 NOTE — Progress Notes (Signed)
CARDIOLOGY OFFICE NOTE  Date:  08/10/2014    Laurie Larsen Date of Birth: 1923/02/03 Medical Record #[id number]  PCP:  [name], MD  Cardiologist:  [place]    Chief Complaint  Patient presents with  . Follow-up    no chest discomfort,very little swelling. clarify medication     History of Present Illness: Laurie Larsen is a 81 y.o. female who presents today for  follow up diastolic CHF.  She has a hx of diastolic HF and chronic R pleural effusion, HLD, pulmonary HTN, chronic atrial fib, chronic coumadin therapy, HTN, CAD with known occlusion of a distal LCX branch from 2005, SSS with PPM in place, chronic pain, and hypothyroidism. She had pneumonia  in July of 2014 as well as right pleural effusion requiring thoracentesis - evaluation suggested a transudative process with some reactive mesothelial cells. Echo done in April 2015 showed normal LV and valvular function with moderate pulmonary HTN.   Admitted to [facility] in August 2015 with exacerbation of diastolic HF. She currently lives at [facility].   Seen in February with increased CHF findings and diuresed as outpatient. Admitted 05/31/14-06/04/14 with acute respiratory failure with hypoxemia and CHF. She was diuresed with excellent result.   When seen in pacemaker clinic in May atenolol was increased due to some increased HR in AFib. On follow up today she is seen with her grand-daughter. She is trying to limit her salt intake. She is taking lasix 60 mg daily. She knows if she eats something saltier she takes 80 mg . Now breathing is doing OK. No increased swelling and weight is stable. She is concerned that she will need pacer revision later this year.    Past Medical History  Diagnosis Date  . Permanent atrial fibrillation     a. Chronic Coumadin.  [name] HTN (hypertension)   . Chronic diastolic CHF (congestive heart failure), NYHA class 1     a. 04/2013 Echo: EF 50-55%, triv MR, mildly dil LA, PASP , mild TR,  mild-mod RA dil.  [name] CAD (coronary artery disease)     a. 2005 Cath: 100% dLCX-->Med managed.  . Sick sinus syndrome     a. 05/2008 s/p MDT EnRhythm DC PPM ([facility]).  . Chronic pain     a. ? due to arthritis  . Glaucoma   . Hyperlipidemia   . Diverticulitis large intestine   . Allergy   . Phlebitis     a. after pacemeker placement in 2010.  [name] Colon polyps   . Hypothyroidism   . Transfusion history     a. After childbirth 60 years ago (1957)  . Rash/skin eruption     a. Multiple episodes - wide distribution-intermittent, possibly drug rash.  . Pulmonary HTN     a. PASP on echo 04/2013.  [name] Chronic Right Pleural Effusion     a. 07/2012 s/p thoracentesis - Followed by Dr. Delford Field [facility] Pulmonology.    Past Surgical History  Procedure Laterality Date  . Appendectomy    . Abdominal hysterectomy    . Tonsillectomy    . Kidney surgery      Right  . Ankle fracture surgery      Left, pin  . Pacemaker insertion  06/20/08    MDT EnRhythm DR implanted by Dr Reyes Ivan  . Breast surgery      Needle guided excision, right breast calcification  . Breast fibroadenoma surgery  2010    Right  . Skin graft  left leg     Medications: Current Outpatient Prescriptions  Medication Sig Dispense Refill  . atorvastatin (LIPITOR) 40 MG tablet Take 0.5 tablets (20 mg total) by mouth at bedtime. 45 tablet 3  . calcium-vitamin D (OSCAL WITH D) 500-200 MG-UNIT per tablet Take 1 tablet by mouth daily.    [name]. diltiazem (CARDIZEM CD) 180 MG 24 hr capsule Take 1 capsule (180 mg total) by mouth daily. 90 capsule 1  . dorzolamide-timolol (COSOPT) 22.3-6.8 MG/ML ophthalmic solution Place 1 drop into both eyes 2 (two) times daily.     [name]. HYDROcodone-acetaminophen (NORCO) 10-325 MG per tablet Take 1 tablet by mouth 4 (four) times daily as needed for severe pain.     [name]. levothyroxine (SYNTHROID, LEVOTHROID) 112 MCG tablet Take 112 mcg by mouth daily before breakfast.    . polyethylene glycol (MIRALAX /  GLYCOLAX) packet Take 17 g by mouth daily as needed for mild constipation.    . traMADol (ULTRAM) 50 MG tablet Take 50 mg by mouth 3 (three) times daily as needed for moderate pain.     [name]. travoprost, benzalkonium, (TRAVATAN) 0.004 % ophthalmic solution Place 1 drop into both eyes daily.     . Vitamin D, Ergocalciferol, (DRISDOL) 50000 UNITS CAPS capsule Take 50,000 Units by mouth once a week.   5  . warfarin (COUMADIN) 5 MG tablet TAKE AS DIRECTED BY COUMADIN CLINIC 90 tablet 1  . atenolol (TENORMIN) 25 MG tablet Take 1 tablet (25 mg total) by mouth 2 (two) times daily. 60 tablet 6  . furosemide (LASIX) 40 MG tablet Take 1&1/2 tablets ( 60 mg ) daily may take 80 mg if needed 60 tablet 6  . potassium chloride SA (K-DUR,KLOR-CON) 20 MEQ tablet Take 2 tablets daily 60 tablet 6  . [DISCONTINUED] potassium chloride (KLOR-CON 10) 10 MEQ tablet Take 1 tablet (10 mEq total) by mouth 2 (two) times daily. 60 tablet 5   No current facility-administered medications for this visit.    Allergies: Allergies  Allergen Reactions  . Amoxicillin Other (See Comments)    Reaction:  Weakness  . Digoxin Other (See Comments)    Reaction:  Weakness and dehydration  . Risedronate Sodium Rash    Social History: The patient  reports that she has never smoked. She has never used smokeless tobacco. She reports that she does not drink alcohol or use illicit drugs.   Family History: The patient's family history includes Cancer in her cousin, mother, and paternal aunt; Diabetes in her mother; Heart attack in her brother and father; Heart disease in her father; Hyperlipidemia in her father; Hypertension in her father; Stroke in her brother.   Review of Systems: Please see the history of present illness.      All other systems are reviewed and negative.   Physical Exam: VS:  BP 110/78 mmHg  Pulse 76  Ht 5\' 4"  (1.626 m)  Wt 46.584 kg (102 lb 11.2 oz)  BMI 17.62 kg/m2 .  BMI Body mass index is 17.62 kg/(m^2).  Wt  Readings from Last 3 Encounters:  08/10/14 46.584 kg (102 lb 11.2 oz)  06/27/14 46.947 kg (103 lb 8 oz)  06/19/14 46.72 kg (103 lb)    General: Pleasant. Chronically ill. Quite kyphotic but in no acute distress.  HEENT: Normal. Neck: Supple, no JVD, carotid bruits, or masses noted.  Cardiac: Irregular irregular rhythm. Rate is ok. No murmurs, rubs, or gallops. Trace edema.  Respiratory:  Lungs are clear to auscultation bilaterally with normal work  of breathing.  GI: Soft and nontender.  MS: No deformity or atrophy. Gait and ROM intact.Using a cane.  Skin: Warm and dry. Color is normal.  Neuro:  Strength and sensation are intact and no gross focal deficits noted.  Psych: Alert, appropriate and with normal affect.   LABORATORY DATA:  EKG:  EKG is not ordered today.   Lab Results  Component Value Date   WBC 8.8 06/03/2014   HGB 13.1 06/04/2014   HCT 39.1 06/03/2014   PLT 225 06/03/2014   GLUCOSE 114* 06/04/2014   CHOL 117 06/01/2014   TRIG 68 06/01/2014   HDL 43 06/01/2014   LDLCALC 60 06/01/2014   ALT 23 05/31/2014   AST 30 05/31/2014   NA 135 06/04/2014   K 4.4 06/04/2014   CL 103 06/04/2014   CREATININE 0.82 06/04/2014   BUN 44* 06/04/2014   CO2 25 06/04/2014   TSH 1.52 03/23/2013   INR 2.3* 08/09/2014    BNP (last 3 results)  Recent Labs  05/31/14 1255  BNP 1124.0*    ProBNP (last 3 results)  Recent Labs  11/10/13 1228 03/10/14 1014 03/21/14 1500  PROBNP 418.0* 925.0* 844.0*     Other Studies Reviewed Today:  None  Assessment/Plan: 1. Chronic diastolic HF - weight and symptoms are stable on lasix 60 mg daily. She understands to take extra lasix if weight increases or she has increased dyspnea. stressed importance of taking her medications daily and avoiding salt. Will follow up in 3 months.   2. Chronic atrial fibrillation: Rate controlled. Continue coumadin which she is tolerating. INR yesterday 2.3   3. Pulmonary HTN  4. Essential  hypertension, benign: BP controlled on current regimen which includes Atenolol, Diltiazem.   5. 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 May need pacer revision soon. Explained what this would entail.   6. CAD - continue with medical management  7. HLD (hyperlipidemia): on statin therapy  Current medicines are reviewed with the patient today.  The patient does not have concerns regarding medicines other than what has been noted above.  The following changes have been made:  See above.  Labs/ tests ordered today include:    No orders of the defined types were placed in this encounter.    Disposition:   FU with Dr. [place] 3 months  Patient is agreeable to this plan and will call if any problems develop in the interim.   Signed: Duc Crocket [place] MD, [facility]    08/10/2014 2:13 PM

## 2014-08-17 LAB — PROTIME-INR: INR: 3.5 — AB (ref 0.9–1.1)

## 2014-08-18 ENCOUNTER — Ambulatory Visit (INDEPENDENT_AMBULATORY_CARE_PROVIDER_SITE_OTHER): Payer: Medicare Other | Admitting: Internal Medicine

## 2014-08-18 DIAGNOSIS — I480 Paroxysmal atrial fibrillation: Secondary | ICD-10-CM

## 2014-08-23 ENCOUNTER — Encounter: Payer: Self-pay | Admitting: Family Medicine

## 2014-08-23 ENCOUNTER — Telehealth: Payer: Self-pay

## 2014-08-23 ENCOUNTER — Other Ambulatory Visit: Payer: Self-pay | Admitting: Family Medicine

## 2014-08-23 ENCOUNTER — Encounter (INDEPENDENT_AMBULATORY_CARE_PROVIDER_SITE_OTHER): Payer: Self-pay

## 2014-08-23 ENCOUNTER — Ambulatory Visit (INDEPENDENT_AMBULATORY_CARE_PROVIDER_SITE_OTHER): Payer: Medicare Other | Admitting: Family Medicine

## 2014-08-23 VITALS — BP 110/80 | HR 74 | Temp 97.7°F | Ht 61.5 in | Wt 103.4 lb

## 2014-08-23 DIAGNOSIS — E039 Hypothyroidism, unspecified: Secondary | ICD-10-CM

## 2014-08-23 DIAGNOSIS — I482 Chronic atrial fibrillation, unspecified: Secondary | ICD-10-CM

## 2014-08-23 DIAGNOSIS — I272 Pulmonary hypertension, unspecified: Secondary | ICD-10-CM

## 2014-08-23 DIAGNOSIS — I5032 Chronic diastolic (congestive) heart failure: Secondary | ICD-10-CM | POA: Diagnosis not present

## 2014-08-23 DIAGNOSIS — I10 Essential (primary) hypertension: Secondary | ICD-10-CM

## 2014-08-23 DIAGNOSIS — E785 Hyperlipidemia, unspecified: Secondary | ICD-10-CM

## 2014-08-23 DIAGNOSIS — I270 Primary pulmonary hypertension: Secondary | ICD-10-CM

## 2014-08-23 LAB — TSH: TSH: 0.48 u[IU]/mL (ref 0.35–4.50)

## 2014-08-23 MED ORDER — LEVOTHYROXINE SODIUM 100 MCG PO TABS: 100.0000 ug | ORAL_TABLET | Freq: Every day | ORAL | Status: DC

## 2014-08-23 MED ORDER — ATORVASTATIN CALCIUM 40 MG PO TABS: 20.0000 mg | ORAL_TABLET | Freq: Every day | ORAL | Status: DC

## 2014-08-23 NOTE — Assessment & Plan Note (Signed)
Rate controlled on Atenolol. Doing well on Coumadin. Patient to continue.

## 2014-08-23 NOTE — Assessment & Plan Note (Signed)
Stable and well controlled. Continue Lipitor. Lipid panel today.

## 2014-08-23 NOTE — Progress Notes (Signed)
Subjective:    Patient ID: Laurie Larsen, female    DOB: 1923-02-11, 81 y.o.   MRN: [id number]  HPI 81 year old female presents to establish care.  Current issues/concerns are below:  Chronic diastolic CHF (NYHA class 1)  Followed closely by cardiology, Dr. [place].  Has had recent exacerbation in May of this year.  Current stable on Atenolol and Lasix (60 to 80 mg daily)  No recent SOB, increasing LE edema, PND, Orthopnea.  CAD/SSS.   Stable.  No recent chest pain.   Patient due to change out pacer later this year.   Afib  Stable and rate controlled on Atenolol.  Doing well on Coumadin.  Pulmonary HTN  Stable on O2 at night.  Hypothyroidism  Doing well on synthroid (112 mcg daily).  No recent fatigue, weight loss/gain, constipation.  Due for TSH.  Hyperlipidemia  Well controlled on Lipitor.   PMH, Surgical Hx, Family Hx, Social History reviewed and updated as below.  Past Medical History  Diagnosis Date  . Permanent atrial fibrillation     a. Chronic Coumadin.  [name] HTN (hypertension)   . Chronic diastolic CHF (congestive heart failure), NYHA class 1     a. 04/2013 Echo: EF 50-55%, triv MR, mildly dil LA, PASP , mild TR, mild-mod RA dil.  [name] CAD (coronary artery disease)     a. 2005 Cath: 100% dLCX-->Med managed.  . Sick sinus syndrome     a. 05/2008 s/p MDT EnRhythm DC PPM ([facility]).  . Chronic pain     a. ? due to arthritis  . Glaucoma   . Hyperlipidemia   . Diverticulitis large intestine   . Allergy   . Phlebitis     a. after pacemeker placement in 2010.  [name] Colon polyps   . Hypothyroidism   . Transfusion history     a. After childbirth 60 years ago (1957)  . Rash/skin eruption     a. Multiple episodes - wide distribution-intermittent, possibly drug rash.  . Pulmonary HTN     a. PASP on echo 04/2013.  [name] Chronic Right Pleural Effusion     a. 07/2012 s/p thoracentesis - Followed by Dr. Delford Field [facility] Pulmonology.  . Arthritis     . Bronchitis   . Hay fever   . Colon polyps   . History of blood transfusion   . Urine incontinence   . Back complaints    Past Surgical History  Procedure Laterality Date  . Appendectomy  1939  . Abdominal hysterectomy  1977  . Tonsillectomy  81 yrs old  . Kidney surgery  1955    Right  . Ankle fracture surgery  1979    Left, pin  . Pacemaker insertion  06/20/08    MDT EnRhythm DR implanted by Dr Reyes Ivan  . Breast surgery      Needle guided excision, right breast calcification  . Breast fibroadenoma surgery  2010    Right  . Skin graft      left leg  . Breast biopsy  2011   Family History  Problem Relation Age of Onset  . Cancer Mother     colon with mets  . Diabetes Mother   . Heart disease Father   . Hyperlipidemia Father   . Hypertension Father   . Stroke Brother     brother who died Jun 18, 2022  . Cancer Paternal Aunt     breast  . Cancer Cousin     uterine, lung cancer  .  Heart attack Brother   . Heart attack Father    History   Social History  . Marital Status: Married    Spouse Name: Lorella Nimrod  . Number of Children: 3  . Years of Education: 13   Occupational History  . RETIRED    Social History Main Topics  . Smoking status: Never Smoker   . Smokeless tobacco: Never Used  . Alcohol Use: No  . Drug Use: No  . Sexual Activity: No   Other Topics Concern  . 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  Married 1948. 3 sons - 06-09-2050, 09-Jun-2055, 2056/06/08; 5 grandchildren-one deceased -  OD @ 49.  Lives alone with husband and they are independent in ADLs.  End of Life Care: no prolonged heroic measures, i.e. Artificial feeding or hydration; DNR; no prolonged intubation.   Review of Systems Per HPI. All other systems negative.     Objective:   Physical Exam  Constitutional: She is oriented to person, place, and time. No distress.  Thin, frail elderly female in NAD.   HENT:   Head: Normocephalic and atraumatic.  Nose: Nose normal.  Eyes: Conjunctivae are normal. No scleral icterus.  Neck: Neck supple.  Cardiovascular: Normal rate.  An irregular rhythm present.  Pulmonary/Chest: Effort normal and breath sounds normal. No respiratory distress. She has no wheezes. She has no rales.  Abdominal: Soft. She exhibits no distension. There is no tenderness.  Musculoskeletal:  Trace bilateral lower extremity edema.  Hyperpigmentation noted consistent with chronic venous insufficiency.   Lymphadenopathy:    She has no cervical adenopathy.  Neurological: She is alert and oriented to person, place, and time.  Skin: Skin is warm and dry. No rash noted.  Psychiatric: She has a normal mood and affect.  Vitals reviewed.     Assessment & Plan:  See Problem List

## 2014-08-23 NOTE — Progress Notes (Signed)
Pre visit review using our clinic review tool, if applicable. No additional management support is needed unless otherwise documented below in the visit note. 

## 2014-08-23 NOTE — Assessment & Plan Note (Addendum)
Stable. Followed closely by Pulm. Patient to continue nightly oxygen.

## 2014-08-23 NOTE — Assessment & Plan Note (Signed)
Well-controlled on atenolol. Will continue this.

## 2014-08-23 NOTE — Patient Instructions (Signed)
It was nice to see you today.  I will refill your synthroid after your labs return.  Follow up in ~3-6 months.  Take care  Dr. Adriana Simas

## 2014-08-23 NOTE — Assessment & Plan Note (Signed)
Stable this time. No evidence of exacerbation. Advised compliance with atenolol and Lasix.

## 2014-08-23 NOTE — Assessment & Plan Note (Signed)
Stable. TSH today. Will refill following lab results.

## 2014-08-23 NOTE — Telephone Encounter (Signed)
Pt was informed of lab work and was told to lower her synthroid to 100 mcg. Also told her of the new prescription at her pharmacy.

## 2014-09-01 LAB — PROTIME-INR: INR: 3.1 — AB (ref 0.9–1.1)

## 2014-09-04 ENCOUNTER — Ambulatory Visit (INDEPENDENT_AMBULATORY_CARE_PROVIDER_SITE_OTHER): Payer: Medicare Other | Admitting: Cardiology

## 2014-09-04 DIAGNOSIS — I482 Chronic atrial fibrillation, unspecified: Secondary | ICD-10-CM

## 2014-09-06 ENCOUNTER — Other Ambulatory Visit: Payer: Self-pay | Admitting: Family Medicine

## 2014-09-06 ENCOUNTER — Telehealth: Payer: Self-pay | Admitting: *Deleted

## 2014-09-06 ENCOUNTER — Ambulatory Visit (INDEPENDENT_AMBULATORY_CARE_PROVIDER_SITE_OTHER): Payer: Medicare Other | Admitting: *Deleted

## 2014-09-06 DIAGNOSIS — Z45010 Encounter for checking and testing of cardiac pacemaker pulse generator [battery]: Secondary | ICD-10-CM

## 2014-09-06 LAB — CUP PACEART INCLINIC DEVICE CHECK
Battery Voltage: 2.85 V
Date Time Interrogation Session: 20160810113752
Lead Channel Impedance Value: 472 Ohm
Lead Channel Pacing Threshold Pulse Width: 0.4 ms
Lead Channel Sensing Intrinsic Amplitude: 0.8941
Lead Channel Setting Pacing Amplitude: 2.5 V
Lead Channel Setting Pacing Pulse Width: 0.4 ms
MDC IDC MSMT LEADCHNL RV IMPEDANCE VALUE: 432 Ohm
MDC IDC MSMT LEADCHNL RV PACING THRESHOLD AMPLITUDE: 1 V
MDC IDC MSMT LEADCHNL RV SENSING INTR AMPL: 6.9 mV
MDC IDC SET LEADCHNL RV SENSING SENSITIVITY: 0.9 mV
MDC IDC SET ZONE DETECTION INTERVAL: 400 ms
MDC IDC STAT BRADY RV PERCENT PACED: 68.19 %
Zone Setting Detection Interval: 350 ms

## 2014-09-06 MED ORDER — LEVOTHYROXINE SODIUM 112 MCG PO TABS: 112.0000 ug | ORAL_TABLET | Freq: Every day | ORAL | Status: DC

## 2014-09-06 NOTE — Progress Notes (Signed)
Battery check only. Remaining power 2.85V, ERI=2.81V. ROV w/ [facility] device clinic in 99mo, billable.

## 2014-09-06 NOTE — Telephone Encounter (Signed)
Pt called states she has been feeling weak since the decrease of Synthroid medication.  Spoke with Dr Adriana Simas, he increased the medication to 112 mcg.

## 2014-09-18 ENCOUNTER — Ambulatory Visit: Payer: BLUE CROSS/BLUE SHIELD | Admitting: Cardiology

## 2014-09-18 LAB — PROTIME-INR: INR: 3.5 — AB (ref 0.9–1.1)

## 2014-09-19 ENCOUNTER — Ambulatory Visit (INDEPENDENT_AMBULATORY_CARE_PROVIDER_SITE_OTHER): Payer: Medicare Other | Admitting: Cardiology

## 2014-09-19 DIAGNOSIS — I482 Chronic atrial fibrillation, unspecified: Secondary | ICD-10-CM

## 2014-09-29 ENCOUNTER — Telehealth: Payer: Self-pay | Admitting: Cardiology

## 2014-09-29 NOTE — Telephone Encounter (Signed)
°  1. Which medications need to be refilled? Atenolol (the rx needs to read 1 tab po bid)  2. Which pharmacy is medication to be sent to?[facility]   3. Do they need a 30 day or 90 day supply? 90  4. Would they like a call back once the medication has been sent to the pharmacy? Yes

## 2014-10-03 MED ORDER — ATENOLOL 25 MG PO TABS: 25.0000 mg | ORAL_TABLET | Freq: Two times a day (BID) | ORAL | Status: DC

## 2014-10-03 NOTE — Telephone Encounter (Signed)
Refill submitted to patient's preferred pharmacy. Informed patient. Pt voiced understanding, no other stated concerns at this time.  

## 2014-10-05 LAB — PROTIME-INR: INR: 2.8 — AB (ref 0.9–1.1)

## 2014-10-06 ENCOUNTER — Ambulatory Visit (INDEPENDENT_AMBULATORY_CARE_PROVIDER_SITE_OTHER): Payer: Medicare Other | Admitting: Cardiovascular Disease

## 2014-10-06 DIAGNOSIS — I482 Chronic atrial fibrillation, unspecified: Secondary | ICD-10-CM

## 2014-10-12 ENCOUNTER — Other Ambulatory Visit: Payer: Self-pay | Admitting: Cardiology

## 2014-10-13 ENCOUNTER — Other Ambulatory Visit: Payer: Self-pay

## 2014-10-13 DIAGNOSIS — Z1231 Encounter for screening mammogram for malignant neoplasm of breast: Secondary | ICD-10-CM

## 2014-10-13 NOTE — Telephone Encounter (Signed)
Laurie M [place], MD at 08/10/2014 2:12 PM  potassium chloride SA (K-DUR,KLOR-CON) 20 MEQ tabletTake 2 tablets daily Patient Instructions     Continue your current therapy and use extra lasix as needed for weight gain or increased swelling or shortness of breath.

## 2014-10-16 NOTE — Telephone Encounter (Signed)
Spoke to patient she stated she is taking potassium 10 meq twice a day.Stated she does not need a refill,just had refilled last week.

## 2014-10-17 ENCOUNTER — Encounter: Payer: Self-pay | Admitting: Internal Medicine

## 2014-10-17 ENCOUNTER — Inpatient Hospital Stay
Admission: EM | Admit: 2014-10-17 | Discharge: 2014-10-24 | DRG: 409 | Disposition: A | Discharge status: Skilled Nursing Facility | Payer: Medicare Other | Attending: General Surgery | Admitting: General Surgery

## 2014-10-17 ENCOUNTER — Encounter: Payer: Self-pay | Admitting: Emergency Medicine

## 2014-10-17 ENCOUNTER — Emergency Department: Payer: Medicare Other

## 2014-10-17 DIAGNOSIS — Z833 Family history of diabetes mellitus: Secondary | ICD-10-CM

## 2014-10-17 DIAGNOSIS — K819 Cholecystitis, unspecified: Secondary | ICD-10-CM | POA: Diagnosis present

## 2014-10-17 DIAGNOSIS — Z7901 Long term (current) use of anticoagulants: Secondary | ICD-10-CM

## 2014-10-17 DIAGNOSIS — K810 Acute cholecystitis: Secondary | ICD-10-CM | POA: Diagnosis present

## 2014-10-17 DIAGNOSIS — H409 Unspecified glaucoma: Secondary | ICD-10-CM | POA: Diagnosis present

## 2014-10-17 DIAGNOSIS — E039 Hypothyroidism, unspecified: Secondary | ICD-10-CM | POA: Diagnosis present

## 2014-10-17 DIAGNOSIS — E440 Moderate protein-calorie malnutrition: Secondary | ICD-10-CM | POA: Diagnosis present

## 2014-10-17 DIAGNOSIS — Z8249 Family history of ischemic heart disease and other diseases of the circulatory system: Secondary | ICD-10-CM | POA: Diagnosis not present

## 2014-10-17 DIAGNOSIS — G4730 Sleep apnea, unspecified: Secondary | ICD-10-CM | POA: Diagnosis present

## 2014-10-17 DIAGNOSIS — I272 Other secondary pulmonary hypertension: Secondary | ICD-10-CM | POA: Diagnosis present

## 2014-10-17 DIAGNOSIS — E785 Hyperlipidemia, unspecified: Secondary | ICD-10-CM | POA: Diagnosis present

## 2014-10-17 DIAGNOSIS — Z823 Family history of stroke: Secondary | ICD-10-CM

## 2014-10-17 DIAGNOSIS — Z888 Allergy status to other drugs, medicaments and biological substances status: Secondary | ICD-10-CM | POA: Diagnosis not present

## 2014-10-17 DIAGNOSIS — I10 Essential (primary) hypertension: Secondary | ICD-10-CM | POA: Diagnosis present

## 2014-10-17 DIAGNOSIS — I5032 Chronic diastolic (congestive) heart failure: Secondary | ICD-10-CM | POA: Diagnosis present

## 2014-10-17 DIAGNOSIS — I482 Chronic atrial fibrillation: Secondary | ICD-10-CM | POA: Diagnosis present

## 2014-10-17 DIAGNOSIS — E876 Hypokalemia: Secondary | ICD-10-CM | POA: Diagnosis present

## 2014-10-17 DIAGNOSIS — H3530 Unspecified macular degeneration: Secondary | ICD-10-CM | POA: Diagnosis present

## 2014-10-17 DIAGNOSIS — Z950 Presence of cardiac pacemaker: Secondary | ICD-10-CM | POA: Diagnosis not present

## 2014-10-17 DIAGNOSIS — I2510 Atherosclerotic heart disease of native coronary artery without angina pectoris: Secondary | ICD-10-CM | POA: Diagnosis present

## 2014-10-17 DIAGNOSIS — Z79899 Other long term (current) drug therapy: Secondary | ICD-10-CM | POA: Diagnosis not present

## 2014-10-17 DIAGNOSIS — I495 Sick sinus syndrome: Secondary | ICD-10-CM | POA: Diagnosis present

## 2014-10-17 DIAGNOSIS — Z880 Allergy status to penicillin: Secondary | ICD-10-CM | POA: Diagnosis not present

## 2014-10-17 DIAGNOSIS — I4891 Unspecified atrial fibrillation: Secondary | ICD-10-CM | POA: Diagnosis present

## 2014-10-17 DIAGNOSIS — I509 Heart failure, unspecified: Secondary | ICD-10-CM

## 2014-10-17 LAB — CBC
HEMATOCRIT: 40.4 % (ref 35.0–47.0)
HEMOGLOBIN: 13.6 g/dL (ref 12.0–16.0)
MCH: 30.8 pg (ref 26.0–34.0)
MCHC: 33.7 g/dL (ref 32.0–36.0)
MCV: 91.5 fL (ref 80.0–100.0)
Platelets: 196 10*3/uL (ref 150–440)
RBC: 4.42 MIL/uL (ref 3.80–5.20)
RDW: 14.7 % — ABNORMAL HIGH (ref 11.5–14.5)
WBC: 19.7 10*3/uL — AB (ref 3.6–11.0)

## 2014-10-17 LAB — PROTIME-INR
INR: 3.05
PROTHROMBIN TIME: 31.6 s — AB (ref 11.4–15.0)

## 2014-10-17 LAB — TYPE AND SCREEN
ABO/RH(D): O POS
ANTIBODY SCREEN: NEGATIVE

## 2014-10-17 LAB — LACTIC ACID, PLASMA
Lactic Acid, Venous: 1.7 mmol/L (ref 0.5–2.0)
Lactic Acid, Venous: 1.2 mmol/L (ref 0.5–2.0)

## 2014-10-17 LAB — LIPASE, BLOOD: LIPASE: 24 U/L (ref 22–51)

## 2014-10-17 LAB — COMPREHENSIVE METABOLIC PANEL
ALT: 28 U/L (ref 14–54)
AST: 54 U/L — AB (ref 15–41)
Albumin: 3.8 g/dL (ref 3.5–5.0)
Alkaline Phosphatase: 132 U/L — ABNORMAL HIGH (ref 38–126)
Anion gap: 11 (ref 5–15)
BUN: 45 mg/dL — AB (ref 6–20)
CHLORIDE: 94 mmol/L — AB (ref 101–111)
CO2: 27 mmol/L (ref 22–32)
Calcium: 8.9 mg/dL (ref 8.9–10.3)
Creatinine, Ser: 1.47 mg/dL — ABNORMAL HIGH (ref 0.44–1.00)
GFR, EST AFRICAN AMERICAN: 35 mL/min — AB (ref >60)
GFR, EST NON AFRICAN AMERICAN: 30 mL/min — AB (ref >60)
Glucose, Bld: 137 mg/dL — ABNORMAL HIGH (ref 65–99)
POTASSIUM: 3.4 mmol/L — AB (ref 3.5–5.1)
Sodium: 132 mmol/L — ABNORMAL LOW (ref 135–145)
Total Bilirubin: 1.3 mg/dL — ABNORMAL HIGH (ref 0.3–1.2)
Total Protein: 7.2 g/dL (ref 6.5–8.1)

## 2014-10-17 LAB — MRSA PCR SCREENING: MRSA BY PCR: NEGATIVE

## 2014-10-17 LAB — PHOSPHORUS: PHOSPHORUS: 2.7 mg/dL (ref 2.5–4.6)

## 2014-10-17 LAB — APTT: APTT: 49 s — AB (ref 24–36)

## 2014-10-17 LAB — ABO/RH: ABO/RH(D): O POS

## 2014-10-17 LAB — MAGNESIUM: MAGNESIUM: 1.7 mg/dL (ref 1.7–2.4)

## 2014-10-17 MED ORDER — HYDRALAZINE HCL 20 MG/ML IJ SOLN: 10.0000 mg | INTRAMUSCULAR | Status: DC | PRN

## 2014-10-17 MED ORDER — MORPHINE SULFATE (PF) 2 MG/ML IV SOLN
INTRAVENOUS | Status: AC
  Filled 2014-10-17: qty 1

## 2014-10-17 MED ORDER — LATANOPROST 0.005 % OP SOLN
1.0000 [drp] | Freq: Every day | OPHTHALMIC | Status: DC
  Administered 2014-10-17 – 2014-10-23 (×7): 1 [drp] via OPHTHALMIC
  Filled 2014-10-17 (×2): qty 2.5

## 2014-10-17 MED ORDER — ONDANSETRON HCL 4 MG/2ML IJ SOLN
INTRAMUSCULAR | Status: AC
  Filled 2014-10-17: qty 2

## 2014-10-17 MED ORDER — MORPHINE SULFATE (PF) 2 MG/ML IV SOLN
2.0000 mg | INTRAVENOUS | Status: DC | PRN
  Administered 2014-10-17 – 2014-10-18 (×4): 2 mg via INTRAVENOUS
  Filled 2014-10-17 (×3): qty 1

## 2014-10-17 MED ORDER — FENTANYL CITRATE (PF) 100 MCG/2ML IJ SOLN
25.0000 ug | Freq: Once | INTRAMUSCULAR | Status: AC
  Administered 2014-10-17: 25 ug via INTRAVENOUS
  Filled 2014-10-17: qty 2

## 2014-10-17 MED ORDER — METRONIDAZOLE IN NACL 5-0.79 MG/ML-% IV SOLN
500.0000 mg | Freq: Four times a day (QID) | INTRAVENOUS | Status: DC
  Administered 2014-10-17 – 2014-10-21 (×12): 500 mg via INTRAVENOUS
  Filled 2014-10-17 (×25): qty 100

## 2014-10-17 MED ORDER — INFLUENZA VAC SPLIT QUAD 0.5 ML IM SUSY
0.5000 mL | PREFILLED_SYRINGE | INTRAMUSCULAR | Status: AC
  Administered 2014-10-18: 0.5 mL via INTRAMUSCULAR
  Filled 2014-10-17: qty 0.5

## 2014-10-17 MED ORDER — ONDANSETRON 4 MG PO TBDP
4.0000 mg | ORAL_TABLET | Freq: Four times a day (QID) | ORAL | Status: DC | PRN
  Filled 2014-10-17: qty 1

## 2014-10-17 MED ORDER — SODIUM CHLORIDE 0.9 % IV BOLUS (SEPSIS)
500.0000 mL | Freq: Once | INTRAVENOUS | Status: AC
  Administered 2014-10-17: 500 mL via INTRAVENOUS

## 2014-10-17 MED ORDER — DORZOLAMIDE HCL-TIMOLOL MAL 2-0.5 % OP SOLN
1.0000 [drp] | Freq: Two times a day (BID) | OPHTHALMIC | Status: DC
  Administered 2014-10-17 – 2014-10-24 (×14): 1 [drp] via OPHTHALMIC
  Filled 2014-10-17: qty 10

## 2014-10-17 MED ORDER — FUROSEMIDE 10 MG/ML IJ SOLN
20.0000 mg | Freq: Once | INTRAMUSCULAR | Status: AC
  Administered 2014-10-18: 20 mg via INTRAVENOUS
  Filled 2014-10-17: qty 2

## 2014-10-17 MED ORDER — SODIUM CHLORIDE 0.9 % IV SOLN
500.0000 mg | Freq: Two times a day (BID) | INTRAVENOUS | Status: DC
  Administered 2014-10-17: 500 mg via INTRAVENOUS
  Filled 2014-10-17 (×4): qty 0.5

## 2014-10-17 MED ORDER — VITAMIN K1 10 MG/ML IJ SOLN
10.0000 mg | Freq: Once | INTRAVENOUS | Status: AC
  Administered 2014-10-17: 10 mg via INTRAVENOUS
  Filled 2014-10-17: qty 1

## 2014-10-17 MED ORDER — ONDANSETRON HCL 4 MG/2ML IJ SOLN
4.0000 mg | Freq: Four times a day (QID) | INTRAMUSCULAR | Status: DC | PRN
  Administered 2014-10-17: 4 mg via INTRAVENOUS

## 2014-10-17 MED ORDER — SODIUM CHLORIDE 0.9 % IV SOLN
Freq: Once | INTRAVENOUS | Status: AC
  Administered 2014-10-17: 23:00:00 via INTRAVENOUS

## 2014-10-17 NOTE — ED Notes (Signed)
Having right side abd pain which radiates into back since sunday

## 2014-10-17 NOTE — Consult Note (Signed)
After discussing case with ICU staff, patient admitted for acute cholecystitis VS reviewed, patient on RA, BP stable HR stable Patient on IV abx and will receive FFP to reverse INR. Patient with multiple medical issues.  Patient not in any acute resp/cardiac distress  Patient does NOT meet ICU criteria. Will make patient Step Down status  Plan 1.replace electrolytes 2.will give lasix 20 mg IV after FFP given 3.pain meds as needed

## 2014-10-17 NOTE — H&P (Signed)
Patient ID: Laurie Larsen, female   DOB: 09/01/1923, 81 y.o.   MRN: [id number] CC: Abdominal pain HPI Laurie Larsen is a 81 y.o. female with a three-day history of progressively worsening abdominal pain. Patient states that during that time. She has had a decrease in her appetite as well as a one-time episode of nausea and vomiting which occurred yesterday. She states she's also been having more bowel movements over this time. She's never had any pain like this before. The pain started in the right upper quadrant shoots through her from her front to her back. Patient also radiates from her right to her left. She states she she was worked up for her gallbladder many times in the past but has never had anything like this. Pain now has her curled up in a fetal position to prevent worsening of pain with breathing or movement. She has numerous other medical problems which she takes chronic medications for. She is on chronic an echo regulation for A. fib with warfarin. She is also being treated for hypertension, pulmonary hypertension and sick sinus syndrome. She currently denies any chest pain, shortness of breath.  HPI  Past Medical History  Diagnosis Date  . Permanent atrial fibrillation     a. Chronic Coumadin.  [name] HTN (hypertension)   . Chronic diastolic CHF (congestive heart failure), NYHA class 1     a. 04/2013 Echo: EF 50-55%, triv MR, mildly dil LA, PASP , mild TR, mild-mod RA dil.  [name] CAD (coronary artery disease)     a. 2005 Cath: 100% dLCX-->Med managed.  . Sick sinus syndrome     a. 05/2008 s/p MDT EnRhythm DC PPM ([facility]).  . Chronic pain     a. ? due to arthritis  . Glaucoma   . Hyperlipidemia   . Diverticulitis large intestine   . Allergy   . Phlebitis     a. after pacemeker placement in 2010.  [name] Colon polyps   . Hypothyroidism   . Transfusion history     a. After childbirth 60 years ago (1957)  . Rash/skin eruption     a. Multiple episodes - wide  distribution-intermittent, possibly drug rash.  . Pulmonary HTN     a. PASP on echo 04/2013.  [name] Chronic Right Pleural Effusion     a. 07/2012 s/p thoracentesis - Followed by Dr. Delford Field [facility].  . Arthritis   . Bronchitis   . Hay fever   . Colon polyps   . History of blood transfusion   . Urine incontinence   . Back complaints     Past Surgical History  Procedure Laterality Date  . Appendectomy  1939  . Abdominal hysterectomy  1977  . Tonsillectomy  81 yrs old  . Kidney surgery  1955    Right  . Ankle fracture surgery  1979    Left, pin  . Pacemaker insertion  06/20/08    MDT EnRhythm DR implanted by Dr Reyes Ivan  . Breast surgery      Needle guided excision, right breast calcification  . Breast fibroadenoma surgery  2010    Right  . Skin graft      left leg  . Breast biopsy  2011    Family History  Problem Relation Age of Onset  . Cancer Mother     colon with mets  . Diabetes Mother   . Heart disease Father   . Hyperlipidemia Father   . Hypertension Father   . Stroke  Brother     brother who died '11  . Cancer Paternal Aunt     breast  . Cancer Cousin     uterine, lung cancer  . Heart attack Brother   . Heart attack Father     Social History Social History  Substance Use Topics  . Smoking status: Never Smoker   . Smokeless tobacco: Never Used  . Alcohol Use: No    Allergies  Allergen Reactions  . Amoxicillin Other (See Comments)    Reaction:  Weakness  . Digoxin Other (See Comments)    Reaction:  Weakness and dehydration  . Risedronate Sodium Rash    Current Facility-Administered Medications  Medication Dose Route Frequency Provider Last Rate Last Dose  . meropenem (MERREM) 500 mg in sodium chloride 0.9 % 50 mL IVPB  500 mg Intravenous Q12H Jeanmarie Plant, MD   Stopped at 10/17/14 1858  . phytonadione (VITAMIN K) 10 mg in dextrose 5 % 50 mL IVPB  10 mg Intravenous Once Jeanmarie Plant, MD       Current Outpatient Prescriptions   Medication Sig Dispense Refill  . acetaminophen (TYLENOL) 650 MG CR tablet Take 1,300 mg by mouth at bedtime as needed for pain.    [name] atenolol (TENORMIN) 25 MG tablet Take 1 tablet (25 mg total) by mouth 2 (two) times daily. 180 tablet 3  . atorvastatin (LIPITOR) 40 MG tablet Take 0.5 tablets (20 mg total) by mouth at bedtime. 45 tablet 3  . diltiazem (CARDIZEM CD) 180 MG 24 hr capsule Take 1 capsule (180 mg total) by mouth daily. 90 capsule 1  . dorzolamide-timolol (COSOPT) 22.3-6.8 MG/ML ophthalmic solution Place 1 drop into both eyes 2 (two) times daily.     . furosemide (LASIX) 40 MG tablet Take 60 mg by mouth daily.  60 tablet 6  . HYDROcodone-acetaminophen (NORCO) 10-325 MG per tablet Take 1-2 tablets by mouth 4 (four) times daily as needed for severe pain.     [name] levothyroxine (SYNTHROID, LEVOTHROID) 112 MCG tablet Take 1 tablet (112 mcg total) by mouth daily. 90 tablet 1  . polyethylene glycol (MIRALAX / GLYCOLAX) packet Take 17 g by mouth at bedtime.     . potassium chloride (K-DUR) 10 MEQ tablet Take 10 mEq by mouth 2 (two) times daily.    . traMADol (ULTRAM) 50 MG tablet Take 50-100 mg by mouth 4 (four) times daily as needed for moderate pain.     . Travoprost, BAK Free, (TRAVATAN) 0.004 % SOLN ophthalmic solution Place 1 drop into both eyes at bedtime.    . Vitamin D, Ergocalciferol, (DRISDOL) 50000 UNITS CAPS capsule Take 50,000 Units by mouth every 7 (seven) days. Pt takes on Saturday.    . warfarin (COUMADIN) 5 MG tablet TAKE AS DIRECTED BY COUMADIN CLINIC (Patient taking differently: Take 2.5-5 mg by mouth every evening. Pt takes 2.5mg  on Saturday and  all other days.) 90 tablet 1     Review of Systems A multi-point review of systems was asked and all pertinent positives and negatives were documented within the history of present illness the remainder were negative.  Physical Exam Blood pressure 109/57, pulse 71, temperature 97.5 F (36.4 C), temperature source Oral, resp.  rate 20, height  (1.6 m), weight 45.36 kg (100 lb), SpO2 94 %. CONSTITUTIONAL: Sitting in bed in the fetal position in obvious discomfort. EYES: Pupils are equal, round, and reactive to light, Sclera are muddy. EARS, NOSE, MOUTH AND THROAT: The oropharynx is clear.  The oral mucosa is pink and moist. Hearing is intact to voice. LYMPH NODES:  Lymph nodes in the neck are normal. RESPIRATORY:  Lungs are clear. There is normal respiratory effort, with equal breath sounds bilaterally, however the patient appears to be splinting due to abdominal pain. CARDIOVASCULAR: Heart is irregularly irregular GI: The abdomen is soft, tender to even light palpation in the right upper quadrant, tender to deep palpation in the left upper quadrant, no tenderness in the lower quadrants., and nondistended. There are no palpable masses. There is no hepatosplenomegaly. There are normal bowel sounds in all quadrants. GU: Rectal deferred.   MUSCULOSKELETAL: Normal muscle strength and tone. No cyanosis or edema.   SKIN: Turgor is good and there are no pathologic skin lesions or ulcers. NEUROLOGIC: Motor and sensation is grossly normal. Cranial nerves are grossly intact. PSYCH:  Oriented to person, place and time. Affect is normal.  Data Reviewed Imaging and labs reviewed. Labs concerning for a leukocytosis of 19.7 and INR of 3.05; her CT scan is concerning for acute cholecystitis with pericholecystic fluid and a inflamed gallbladder wall. I have personally reviewed the patient's imaging, laboratory findings and medical records.    Assessment    81 year old female with multiple medical problems with acute cholecystitis.    Plan    Discussed with the patient and her husband the diagnosis of acute cholecystitis. Given her multiple medical problems she will need admission to the ICU prior to any surgical or radiological intervention. Will plan to reverse her INR with FFP. Once her INR is at or near 1.5 we'll then make  a decision between operative intervention and radiological intervention. Given her frail state she may require a percutaneous cholecystostomy drain prior to cholecystectomy.  Have asked the intensive this to assist with her medical problems as well as the reversal of her INR. The fear is that the FFP required for reversal could cause acute pulmonary edema and worsen her cardiovascular state.  Will plan to treat her cholecystitis with IV antibiotics. She was given meropenem in the ER, will add Flagyl to this. She will need to remain nothing by mouth until after an intervention for her cholecystitis.     Time spent with the patient was 45 minutes, with more than 50% of the time spent in face-to-face education, counseling and care coordination.     Ricarda Frame 10/17/2014, 7:08 PM

## 2014-10-17 NOTE — ED Provider Notes (Signed)
[facility] Emergency Department Provider Note  ____________________________________________  Time seen: Approximately 6:14 PM  I have reviewed the triage vital signs and the nursing notes.   HISTORY  Chief Complaint Abdominal Pain    HPI Laurie Larsen is a 81 y.o. female  with a history of atrial fibrillation, as well as Coumadin use, CAD, CHF, who presents today complaining of abdominal pain since Saturday. Today is Tuesday. She has had nausea and she did vomit. She denies any fever or chills. No diarrhea. The pain is significant. It is in the right upper quadrant. She's never had this before. Her only past surgical history is a hysterectomy when she was 81 years old. She denies fever.  Past Medical History  Diagnosis Date  . Permanent atrial fibrillation     a. Chronic Coumadin.  [name] HTN (hypertension)   . Chronic diastolic CHF (congestive heart failure), NYHA class 1     a. 04/2013 Echo: EF 50-55%, triv MR, mildly dil LA, PASP , mild TR, mild-mod RA dil.  [name] CAD (coronary artery disease)     a. 2005 Cath: 100% dLCX-->Med managed.  . Sick sinus syndrome     a. 05/2008 s/p MDT EnRhythm DC PPM ([facility]).  . Chronic pain     a. ? due to arthritis  . Glaucoma   . Hyperlipidemia   . Diverticulitis large intestine   . Allergy   . Phlebitis     a. after pacemeker placement in 2010.  [name] Colon polyps   . Hypothyroidism   . Transfusion history     a. After childbirth 60 years ago (1957)  . Rash/skin eruption     a. Multiple episodes - wide distribution-intermittent, possibly drug rash.  . Pulmonary HTN     a. PASP on echo 04/2013.  [name] Chronic Right Pleural Effusion     a. 07/2012 s/p thoracentesis - Followed by Dr. Delford Field [facility].  . Arthritis   . Bronchitis   . Hay fever   . Colon polyps   . History of blood transfusion   . Urine incontinence   . Back complaints     Patient Active Problem List   Diagnosis Date Noted  .  Protein-calorie malnutrition, severe 06/01/2014  . CHF (congestive heart failure), NYHA class I 05/31/2014  . Cardiac pacemaker in situ 03/10/2014  . HLD (hyperlipidemia) 03/10/2014  . Pulmonary HTN 06/09/2013  . Nocturnal hypoxemia 04/20/2013  . Depression 06/23/2011  . Diastolic CHF, chronic 11/13/2010  . Recurrent pleural effusion on right 10/01/2010  . Chronic anticoagulation 07/10/2010  . Hypothyroidism 04/24/2010  . Essential hypertension, benign 03/01/2010  . ATRIAL FIBRILLATION, CHRONIC 03/01/2010    Past Surgical History  Procedure Laterality Date  . Appendectomy  1939  . Abdominal hysterectomy  1977  . Tonsillectomy  81 yrs old  . Kidney surgery  1955    Right  . Ankle fracture surgery  1979    Left, pin  . Pacemaker insertion  06/20/08    MDT EnRhythm DR implanted by Dr Reyes Ivan  . Breast surgery      Needle guided excision, right breast calcification  . Breast fibroadenoma surgery  2010    Right  . Skin graft      left leg  . Breast biopsy  2011    Current Outpatient Rx  Name  Route  Sig  Dispense  Refill  . acetaminophen (TYLENOL) 650 MG CR tablet   Oral   Take 1,300 mg by  mouth at bedtime as needed for pain.         [name] atenolol (TENORMIN) 25 MG tablet   Oral   Take 1 tablet (25 mg total) by mouth 2 (two) times daily.   180 tablet   3   . atorvastatin (LIPITOR) 40 MG tablet   Oral   Take 0.5 tablets (20 mg total) by mouth at bedtime.   45 tablet   3   . diltiazem (CARDIZEM CD) 180 MG 24 hr capsule   Oral   Take 1 capsule (180 mg total) by mouth daily.   90 capsule   1   . dorzolamide-timolol (COSOPT) 22.3-6.8 MG/ML ophthalmic solution   Both Eyes   Place 1 drop into both eyes 2 (two) times daily.          . furosemide (LASIX) 40 MG tablet   Oral   Take 60 mg by mouth daily.    60 tablet   6   . HYDROcodone-acetaminophen (NORCO) 10-325 MG per tablet   Oral   Take 1-2 tablets by mouth 4 (four) times daily as needed for severe pain.           [name] levothyroxine (SYNTHROID, LEVOTHROID) 112 MCG tablet   Oral   Take 1 tablet (112 mcg total) by mouth daily.   90 tablet   1   . polyethylene glycol (MIRALAX / GLYCOLAX) packet   Oral   Take 17 g by mouth at bedtime.          . potassium chloride (K-DUR) 10 MEQ tablet   Oral   Take 10 mEq by mouth 2 (two) times daily.         . traMADol (ULTRAM) 50 MG tablet   Oral   Take 50-100 mg by mouth 4 (four) times daily as needed for moderate pain.          . Travoprost, BAK Free, (TRAVATAN) 0.004 % SOLN ophthalmic solution   Both Eyes   Place 1 drop into both eyes at bedtime.         . Vitamin D, Ergocalciferol, (DRISDOL) 50000 UNITS CAPS capsule   Oral   Take 50,000 Units by mouth every 7 (seven) days. Pt takes on Saturday.         . warfarin (COUMADIN) 5 MG tablet      TAKE AS DIRECTED BY COUMADIN CLINIC Patient taking differently: Take 2.5-5 mg by mouth every evening. Pt takes 2.5mg  on Saturday and  all other days.   90 tablet   1     90-day supply     Allergies Amoxicillin; Digoxin; and Risedronate sodium  Family History  Problem Relation Age of Onset  . Cancer Mother     colon with mets  . Diabetes Mother   . Heart disease Father   . Hyperlipidemia Father   . Hypertension Father   . Stroke Brother     brother who died 07-04-22  . Cancer Paternal Aunt     breast  . Cancer Cousin     uterine, lung cancer  . Heart attack Brother   . Heart attack Father     Social History Social History  Substance Use Topics  . Smoking status: Never Smoker   . Smokeless tobacco: Never Used  . Alcohol Use: No    Review of Systems Constitutional: No fever/chills Eyes: No visual changes. ENT: No sore throat. Cardiovascular: Denies chest pain. Respiratory: Denies shortness of breath. Gastrointestinal: See history of present illness Genitourinary:  Negative for dysuria. Musculoskeletal: Negative for back pain. Skin: Negative for rash. Neurological:  Negative for headaches, focal weakness or numbness.  10-point ROS otherwise negative.  ____________________________________________   PHYSICAL EXAM:  VITAL SIGNS: ED Triage Vitals  Enc Vitals Group     BP 10/17/14 1512 104/53 mmHg     Pulse Rate 10/17/14 1512 89     Resp 10/17/14 1512 20     Temp 10/17/14 1512 97.5 F (36.4 C)     Temp Source 10/17/14 1512 Oral     SpO2 10/17/14 1512 95 %     Weight 10/17/14 1512 100 lb (45.36 kg)     Height 10/17/14 1512  (1.6 m)     Head Cir --      Peak Flow --      Pain Score 10/17/14 1512 9     Pain Loc --      Pain Edu? --      Excl. in GC? --     Constitutional: Alert and oriented. Well appearing and in no acute distress. Eyes: Conjunctivae are normal. PERRL. EOMI. Head: Atraumatic. Nose: No congestion/rhinnorhea. Mouth/Throat: Mucous membranes are moist.  Oropharynx non-erythematous. Neck: No stridor.   Cardiovascular: Normal rate, regular rhythm. Grossly normal heart sounds.  Good peripheral circulation. Respiratory: Normal respiratory effort.  No retractions. Lungs CTAB. Gastrointestinal: Abdomen is soft but it is focally tender in the right upper quadrant with guarding and rebound. Bowel sounds are hypoactive Musculoskeletal: No lower extremity tenderness nor edema.  No joint effusions. Neurologic:  Normal speech and language. No gross focal neurologic deficits are appreciated. No gait instability. Skin:  Skin is warm, dry and intact. No rash noted. Psychiatric: Mood and affect are normal. Speech and behavior are normal.  ____________________________________________   LABS (all labs ordered are listed, but only abnormal results are displayed)  Labs Reviewed  COMPREHENSIVE METABOLIC PANEL - Abnormal; Notable for the following:    Sodium 132 (*)    Potassium 3.4 (*)    Chloride 94 (*)    Glucose, Bld 137 (*)    BUN 45 (*)    Creatinine, Ser 1.47 (*)    AST 54 (*)    Alkaline Phosphatase 132 (*)    Total  Bilirubin 1.3 (*)    GFR calc non Af Amer 30 (*)    GFR calc Af Amer 35 (*)    All other components within normal limits  CBC - Abnormal; Notable for the following:    WBC 19.7 (*)    RDW 14.7 (*)    All other components within normal limits  APTT - Abnormal; Notable for the following:    aPTT 49 (*)    All other components within normal limits  PROTIME-INR - Abnormal; Notable for the following:    Prothrombin Time 31.6 (*)    All other components within normal limits  LIPASE, BLOOD  LACTIC ACID, PLASMA  URINALYSIS COMPLETEWITH MICROSCOPIC ([facility] ONLY)  LACTIC ACID, PLASMA  TYPE AND SCREEN  ABO/RH   ____________________________________________  EKG   ____________________________________________  RADIOLOGY I have reviewed x-rays  ____________________________________________   PROCEDURES  Procedure(s) performed: None  Critical Care performed: None  ____________________________________________   INITIAL IMPRESSION / ASSESSMENT AND PLAN / ED COURSE  Pertinent labs & imaging results that were available during my care of the patient were reviewed by me and considered in my medical decision making (see chart for details).  Patient was very concerning abdominal exam. IV medication for pain as well as  antibiotic for immediately instituted by me. CT scan shows acute cholecystitis. There is no evidence for radiology of ascending cholangitis. I have discussed with surgery, they will come and evaluate and admit the patient. ____________________________________________   FINAL CLINICAL IMPRESSION(S) / ED DIAGNOSES  Final diagnoses:  None     Jeanmarie Plant, MD 10/17/14 1816

## 2014-10-17 NOTE — Progress Notes (Signed)
ANTIBIOTIC CONSULT NOTE - INITIAL  Pharmacy Consult for Meropenem Indication: Intraabdominal Infection  Allergies  Allergen Reactions  . Amoxicillin Other (See Comments)    Reaction:  Weakness  . Digoxin Other (See Comments)    Reaction:  Weakness and dehydration  . Risedronate Sodium Rash    Patient Measurements: Height:  (160 cm) Weight: 100 lb (45.36 kg) IBW/kg (Calculated) : 52.4   Vital Signs: Temp: 97.5 F (36.4 C) (09/20 1512) Temp Source: Oral (09/20 1512) BP: 105/55 mmHg (09/20 1745) Pulse Rate: 77 (09/20 1745) Intake/Output from previous day:   Intake/Output from this shift:    Labs:  Recent Labs  10/17/14 1515  WBC 19.7*  HGB 13.6  PLT 196  CREATININE 1.47*   Estimated Creatinine Clearance: 18.6 mL/min (by C-G formula based on Cr of 1.47). No results for input(s): VANCOTROUGH, VANCOPEAK, VANCORANDOM, GENTTROUGH, GENTPEAK, GENTRANDOM, TOBRATROUGH, TOBRAPEAK, TOBRARND, AMIKACINPEAK, AMIKACINTROU, AMIKACIN in the last 72 hours.   Microbiology: No results found for this or any previous visit (from the past 720 hour(s)).  Medical History: Past Medical History  Diagnosis Date  . Permanent atrial fibrillation     a. Chronic Coumadin.  [name] HTN (hypertension)   . Chronic diastolic CHF (congestive heart failure), NYHA class 1     a. 04/2013 Echo: EF 50-55%, triv MR, mildly dil LA, PASP , mild TR, mild-mod RA dil.  [name] CAD (coronary artery disease)     a. 2005 Cath: 100% dLCX-->Med managed.  . Sick sinus syndrome     a. 05/2008 s/p MDT EnRhythm DC PPM ([facility]).  . Chronic pain     a. ? due to arthritis  . Glaucoma   . Hyperlipidemia   . Diverticulitis large intestine   . Allergy   . Phlebitis     a. after pacemeker placement in 2010.  [name] Colon polyps   . Hypothyroidism   . Transfusion history     a. After childbirth 60 years ago (1957)  . Rash/skin eruption     a. Multiple episodes - wide distribution-intermittent, possibly drug rash.  .  Pulmonary HTN     a. PASP on echo 04/2013.  [name] Chronic Right Pleural Effusion     a. 07/2012 s/p thoracentesis - Followed by Dr. Delford Field [facility].  . Arthritis   . Bronchitis   . Hay fever   . Colon polyps   . History of blood transfusion   . Urine incontinence   . Back complaints     Medications:  Scheduled:  Assessment: 81 yo female admitted abdominal pain and possible intraabdominal infection.  Pharmacy consulted for meropenem dosing.  Goal of Therapy:  Resolution of infection  Plan:  Will start patient on meropenem  IV Q12H based on renal function. Monitor for changes in renal function and adjust dose as needed. Expected duration 7 days with resolution of temperature and/or normalization of WBC   Pharmacy will continue to follow with you.  Jacqualyn Posey, PharmD Clinical Pharmacist 10/17/2014,6:05 PM

## 2014-10-18 ENCOUNTER — Encounter: Payer: Self-pay | Admitting: Radiology

## 2014-10-18 ENCOUNTER — Inpatient Hospital Stay: Payer: Medicare Other

## 2014-10-18 DIAGNOSIS — I5032 Chronic diastolic (congestive) heart failure: Secondary | ICD-10-CM

## 2014-10-18 DIAGNOSIS — K810 Acute cholecystitis: Secondary | ICD-10-CM | POA: Diagnosis not present

## 2014-10-18 LAB — COMPREHENSIVE METABOLIC PANEL
ALBUMIN: 3.3 g/dL — AB (ref 3.5–5.0)
ALK PHOS: 124 U/L (ref 38–126)
ALT: 27 U/L (ref 14–54)
ANION GAP: 10 (ref 5–15)
AST: 29 U/L (ref 15–41)
BILIRUBIN TOTAL: 1.6 mg/dL — AB (ref 0.3–1.2)
BUN: 35 mg/dL — AB (ref 6–20)
CALCIUM: 8.1 mg/dL — AB (ref 8.9–10.3)
CO2: 26 mmol/L (ref 22–32)
CREATININE: 1.05 mg/dL — AB (ref 0.44–1.00)
Chloride: 101 mmol/L (ref 101–111)
GFR calc Af Amer: 53 mL/min — ABNORMAL LOW (ref >60)
GFR calc non Af Amer: 46 mL/min — ABNORMAL LOW (ref >60)
GLUCOSE: 99 mg/dL (ref 65–99)
Potassium: 2.9 mmol/L — CL (ref 3.5–5.1)
Sodium: 137 mmol/L (ref 135–145)
TOTAL PROTEIN: 6.2 g/dL — AB (ref 6.5–8.1)

## 2014-10-18 LAB — CBC
HEMATOCRIT: 33.6 % — AB (ref 35.0–47.0)
HEMOGLOBIN: 11.5 g/dL — AB (ref 12.0–16.0)
MCH: 31.2 pg (ref 26.0–34.0)
MCHC: 34.2 g/dL (ref 32.0–36.0)
MCV: 91.4 fL (ref 80.0–100.0)
Platelets: 145 10*3/uL — ABNORMAL LOW (ref 150–440)
RBC: 3.68 MIL/uL — AB (ref 3.80–5.20)
RDW: 14.4 % (ref 11.5–14.5)
WBC: 13.3 10*3/uL — ABNORMAL HIGH (ref 3.6–11.0)

## 2014-10-18 LAB — PROTIME-INR
INR: 1.51
Prothrombin Time: 18.4 seconds — ABNORMAL HIGH (ref 11.4–15.0)

## 2014-10-18 LAB — APTT: APTT: 43 s — AB (ref 24–36)

## 2014-10-18 LAB — PHOSPHORUS: Phosphorus: 2.7 mg/dL (ref 2.5–4.6)

## 2014-10-18 LAB — POTASSIUM: Potassium: 3.4 mmol/L — ABNORMAL LOW (ref 3.5–5.1)

## 2014-10-18 LAB — MAGNESIUM: Magnesium: 1.7 mg/dL (ref 1.7–2.4)

## 2014-10-18 MED ORDER — CHLORHEXIDINE GLUCONATE 0.12 % MT SOLN
15.0000 mL | Freq: Two times a day (BID) | OROMUCOSAL | Status: DC
  Administered 2014-10-18 – 2014-10-24 (×8): 15 mL via OROMUCOSAL
  Filled 2014-10-18 (×6): qty 15

## 2014-10-18 MED ORDER — SODIUM CHLORIDE 0.9 % IV SOLN
1.0000 g | Freq: Two times a day (BID) | INTRAVENOUS | Status: DC
  Administered 2014-10-18 – 2014-10-20 (×5): 1 g via INTRAVENOUS
  Filled 2014-10-18 (×9): qty 1

## 2014-10-18 MED ORDER — POTASSIUM CHLORIDE 10 MEQ/100ML IV SOLN
10.0000 meq | INTRAVENOUS | Status: AC
  Administered 2014-10-18 (×2): 10 meq via INTRAVENOUS
  Filled 2014-10-18 (×2): qty 100

## 2014-10-18 MED ORDER — ONDANSETRON HCL 4 MG/2ML IJ SOLN: 4.0000 mg | Freq: Four times a day (QID) | INTRAMUSCULAR | Status: DC | PRN

## 2014-10-18 MED ORDER — MIDAZOLAM HCL 2 MG/2ML IJ SOLN
INTRAMUSCULAR | Status: AC | PRN
  Administered 2014-10-18: 1 mg via INTRAVENOUS

## 2014-10-18 MED ORDER — MORPHINE SULFATE 1 MG/ML IV SOLN
INTRAVENOUS | Status: DC
  Administered 2014-10-18: 18:00:00 via INTRAVENOUS
  Administered 2014-10-19: 1 mg via INTRAVENOUS
  Administered 2014-10-19: 3 mg via INTRAVENOUS
  Filled 2014-10-18: qty 25

## 2014-10-18 MED ORDER — SODIUM CHLORIDE 0.9 % IV SOLN
1.0000 g | Freq: Once | INTRAVENOUS | Status: DC
  Filled 2014-10-18 (×2): qty 10

## 2014-10-18 MED ORDER — CETYLPYRIDINIUM CHLORIDE 0.05 % MT LIQD
7.0000 mL | Freq: Two times a day (BID) | OROMUCOSAL | Status: DC
  Administered 2014-10-19 – 2014-10-24 (×5): 7 mL via OROMUCOSAL
  Filled 2014-10-18 (×2): qty 7

## 2014-10-18 MED ORDER — MIDAZOLAM HCL 5 MG/5ML IJ SOLN
INTRAMUSCULAR | Status: AC
  Filled 2014-10-18: qty 5

## 2014-10-18 MED ORDER — DIPHENHYDRAMINE HCL 12.5 MG/5ML PO ELIX: 12.5000 mg | ORAL_SOLUTION | Freq: Four times a day (QID) | ORAL | Status: DC | PRN

## 2014-10-18 MED ORDER — POTASSIUM CHLORIDE 10 MEQ/100ML IV SOLN
10.0000 meq | INTRAVENOUS | Status: AC
Start: 2014-10-18 — End: 2014-10-18
  Administered 2014-10-18 (×4): 10 meq via INTRAVENOUS
  Filled 2014-10-18 (×4): qty 100

## 2014-10-18 MED ORDER — SODIUM CHLORIDE 0.9 % IJ SOLN: 9.0000 mL | INTRAMUSCULAR | Status: DC | PRN

## 2014-10-18 MED ORDER — FENTANYL CITRATE (PF) 100 MCG/2ML IJ SOLN
INTRAMUSCULAR | Status: AC
  Filled 2014-10-18: qty 2

## 2014-10-18 MED ORDER — MORPHINE SULFATE (PF) 2 MG/ML IV SOLN
INTRAVENOUS | Status: AC
  Filled 2014-10-18: qty 1

## 2014-10-18 MED ORDER — NALOXONE HCL 0.4 MG/ML IJ SOLN: 0.4000 mg | INTRAMUSCULAR | Status: DC | PRN

## 2014-10-18 MED ORDER — DIPHENHYDRAMINE HCL 50 MG/ML IJ SOLN: 12.5000 mg | Freq: Four times a day (QID) | INTRAMUSCULAR | Status: DC | PRN

## 2014-10-18 MED ORDER — MORPHINE SULFATE (PF) 2 MG/ML IV SOLN
2.0000 mg | Freq: Once | INTRAVENOUS | Status: AC
  Administered 2014-10-18: 2 mg via INTRAVENOUS

## 2014-10-18 MED ORDER — FENTANYL CITRATE (PF) 100 MCG/2ML IJ SOLN
INTRAMUSCULAR | Status: AC | PRN
  Administered 2014-10-18 (×3): 25 ug via INTRAVENOUS

## 2014-10-18 NOTE — Progress Notes (Signed)
Electrolyte management note  9/21 AM K+ 3.3 and Ca 8.1( 8.7 corrected for albumin of 3.3.)  40 mEq KCI and 1 gram calcium gluconate x1 ordered. Recheck K+ at 12:00. BMP in AM  9/21 PM K+ 3.4.  Will give additional 40 mEq KCl x2.  Recheck K+ with AM labs  Maryjo Rochester, PharmD Clinical Pharmacist 10/18/2014

## 2014-10-18 NOTE — H&P (Signed)
Chief Complaint: Patient was seen in consultation today for  Chief Complaint  Patient presents with  . Abdominal Pain   at the request of Dr. Tonita Cong.  Referring Physician(s): Dr. Tonita Cong  History of Present Illness: Laurie Larsen is a 81 y.o. female with multiple medical problems including chronic A.fib and CHF.  Patient presents with abdominal pain.  Recent CT demonstrates gallbladder inflammation and patient was evaluated by General Surgery.  Due to patient's age and multiple medical problems, she is not a good candidate for surgery at this time and requested evaluation for a percutaneous cholecystostomy tube.  Patient is currently resting comfortably in ICU.  She has severe abdominal pain with movement.    Past Medical History  Diagnosis Date  . Permanent atrial fibrillation     a. Chronic Coumadin.  [name] HTN (hypertension)   . Chronic diastolic CHF (congestive heart failure), NYHA class 1     a. 04/2013 Echo: EF 50-55%, triv MR, mildly dil LA, PASP , mild TR, mild-mod RA dil.  [name] CAD (coronary artery disease)     a. 2005 Cath: 100% dLCX-->Med managed.  . Sick sinus syndrome     a. 05/2008 s/p MDT EnRhythm DC PPM ([facility]).  . Chronic pain     a. ? due to arthritis  . Glaucoma   . Hyperlipidemia   . Diverticulitis large intestine   . Allergy   . Phlebitis     a. after pacemeker placement in 2010.  [name] Colon polyps   . Hypothyroidism   . Transfusion history     a. After childbirth 60 years ago (1957)  . Rash/skin eruption     a. Multiple episodes - wide distribution-intermittent, possibly drug rash.  . Pulmonary HTN     a. PASP on echo 04/2013.  [name] Chronic Right Pleural Effusion     a. 07/2012 s/p thoracentesis - Followed by Dr. Delford Field [facility] Pulmonology.  . Arthritis   . Bronchitis   . Hay fever   . Colon polyps   . History of blood transfusion   . Urine incontinence   . Back complaints     Past Surgical History  Procedure Laterality Date  .  Appendectomy  1939  . Abdominal hysterectomy  1977  . Tonsillectomy  81 yrs old  . Kidney surgery  1955    Right  . Ankle fracture surgery  1979    Left, pin  . Pacemaker insertion  06/20/08    MDT EnRhythm DR implanted by Dr Reyes Ivan  . Breast surgery      Needle guided excision, right breast calcification  . Breast fibroadenoma surgery  2010    Right  . Skin graft      left leg  . Breast biopsy  2011    Allergies: Amoxicillin; Digoxin; and Risedronate sodium  Medications: Prior to Admission medications   Medication Sig Start Date End Date Taking? Authorizing Provider  acetaminophen (TYLENOL) 650 MG CR tablet Take 1,300 mg by mouth at bedtime as needed for pain.   Yes Historical Provider, MD  atenolol (TENORMIN) 25 MG tablet Take 1 tablet (25 mg total) by mouth 2 (two) times daily. 10/03/14  Yes Peter M [place], MD  atorvastatin (LIPITOR) 40 MG tablet Take 0.5 tablets (20 mg total) by mouth at bedtime. 08/23/14  Yes Tommie Sams, DO  diltiazem (CARDIZEM CD) 180 MG 24 hr capsule Take 1 capsule (180 mg total) by mouth daily. 06/29/14  Yes Peter M [place], MD  dorzolamide-timolol (COSOPT) 22.3-6.8 MG/ML ophthalmic solution Place 1 drop into both eyes 2 (two) times daily.    Yes Historical Provider, MD  furosemide (LASIX) 40 MG tablet Take 60 mg by mouth daily.    Yes Peter M [place], MD  HYDROcodone-acetaminophen [facility]) 10-325 MG per tablet Take 1-2 tablets by mouth 4 (four) times daily as needed for severe pain.    Yes Historical Provider, MD  levothyroxine (SYNTHROID, LEVOTHROID) 112 MCG tablet Take 1 tablet (112 mcg total) by mouth daily. 09/06/14  Yes Tommie Sams, DO  polyethylene glycol (MIRALAX / GLYCOLAX) packet Take 17 g by mouth at bedtime.    Yes Historical Provider, MD  potassium chloride (K-DUR) 10 MEQ tablet Take 10 mEq by mouth 2 (two) times daily.   Yes Historical Provider, MD  traMADol (ULTRAM) 50 MG tablet Take 50-100 mg by mouth 4 (four) times daily as needed for moderate  pain.    Yes Historical Provider, MD  Travoprost, BAK Free, (TRAVATAN) 0.004 % SOLN ophthalmic solution Place 1 drop into both eyes at bedtime.   Yes Historical Provider, MD  Vitamin D, Ergocalciferol, (DRISDOL) 50000 UNITS CAPS capsule Take 50,000 Units by mouth every 7 (seven) days. Pt takes on Jun 03, 2022.   Yes Historical Provider, MD  warfarin (COUMADIN) 5 MG tablet TAKE AS DIRECTED BY COUMADIN CLINIC Patient taking differently: Take 2.5-5 mg by mouth every evening. Pt takes 2.5mg  on Jun 03, 2022 and 5mg  all other days. 08/03/14  Yes Peter M [place], MD     Family History  Problem Relation Age of Onset  . Cancer Mother     colon with mets  . Diabetes Mother   . Heart disease Father   . Hyperlipidemia Father   . Hypertension Father   . Stroke Brother     brother who died 2022-06-10  . Cancer Paternal Aunt     breast  . Cancer Cousin     uterine, lung cancer  . Heart attack Brother   . Heart attack Father     Social History   Social History  . Marital Status: Married    Spouse Name: Lorella Nimrod  . Number of Children: 3  . Years of Education: 13   Occupational History  . RETIRED    Social History Main Topics  . Smoking status: Never Smoker   . Smokeless tobacco: Never Used  . Alcohol Use: No  . Drug Use: No  . Sexual Activity: No   Other Topics Concern  . 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  Married 1948. 3 sons - 06-03-2050, 06-03-55, 06/02/56; 5 grandchildren-one deceased -  OD @ 71.  Lives alone with husband and they are independent in ADLs.  End of Life Care: no prolonged heroic measures, i.e. Artificial feeding or hydration; DNR; no prolonged intubation.      Review of Systems  Respiratory: Negative.   Cardiovascular: Negative.   Gastrointestinal: Positive for abdominal pain.       Severe pain in RUQ and epigastric region.    Vital Signs: BP 134/63 mmHg  Pulse 88  Temp(Src) 98.6 F  (37 C) (Oral)  Resp 18  Ht 5\' 3"  (1.6 m)  Wt 102 lb 4.7 oz (46.4 kg)  BMI 18.13 kg/m2  SpO2 90%  Physical Exam  Constitutional:  Resting comfortably in bed. Alert and oriented.  Pulmonary/Chest: Breath sounds normal.  Abdominal: There is tenderness.  There is guarding.  Pain in RUQ and epigastric region.    Mallampati Score: 2  MD Evaluation Airway: WNL Heart: Other (comments) Heart  comments: irregulra and known A.fib. Abdomen: Other (comments) Abdomen comments: Severe right abdominal pain Chest/ Lungs: WNL ASA  Classification: 2 Mallampati/Airway Score: Two  Imaging: Ct Abdomen Pelvis Wo Contrast  10/17/2014   CLINICAL DATA:  Right-sided abdominal pain.  EXAM: CT ABDOMEN AND PELVIS WITHOUT CONTRAST  TECHNIQUE: Multidetector CT imaging of the abdomen and pelvis was performed following the standard protocol without IV contrast.  COMPARISON:  11/15/2012  FINDINGS: Lower chest: There is moderate to marked cardiac enlargement. Dual lead pacer device wires are identified within the right ventricle and right atrial appendage. There is a large right pleural effusion with overlying compressive type atelectasis. Subsegmental atelectasis versus scar noted in the left lower lobe.  Hepatobiliary: No focal liver abnormality identified. There is diffuse gallbladder wall thickening and pericholecystic edema/fluid noted. No gallstones identified. No significant biliary dilatation  Pancreas: Unremarkable appearance of the pancreas.  Spleen: No focal splenic abnormality.  Adrenals/Urinary Tract: The adrenal glands are normal. Normal appearance of the left kidney. The right kidney is normal. Urinary bladder appears within normal limits.  Stomach/Bowel: The stomach is normal. The small bowel loops have a normal caliber without evidence for bowel obstruction. Moderate stool burden identified throughout the colon. According to patient's clinical records there has been a previous appendectomy. Numerous  distal colonic diverticula identified without acute inflammation.  Vascular/Lymphatic: Calcified atherosclerotic disease involves the abdominal aorta. No aneurysm. No enlarged retroperitoneal or mesenteric adenopathy. No enlarged pelvic or inguinal lymph nodes.  Reproductive: Previous hysterectomy.  No adnexal mass identified.  Other: There is no ascites or focal fluid collections within the abdomen or pelvis.  Musculoskeletal: The bones are diffusely osteopenic. There is a scoliosis deformity. Multi level lumbar degenerative disc disease is identified.  IMPRESSION: 1. The diffuse gallbladder wall thickening and pericholecystic fluid is noted. Findings may reflect acute cholecystitis. If further imaging is clinically indicated consider right upper quadrant sonogram or nuclear medicine hepatobiliary scan. 2. Cardiac enlargement an right pleural effusion. 3. Aortic atherosclerosis. 4. Scoliosis and lumbar degenerative disc disease.   Electronically Signed   By: Signa Kell M.D.   On: 10/17/2014 18:09    Labs:  CBC:  Recent Labs  05/31/14 1255 06/03/14 0519 06/04/14 0954 10/17/14 1515 10/18/14 0355  WBC 8.5 8.8  --  19.7* 13.3*  HGB 13.3 13.0 13.1 13.6 11.5*  HCT 40.3 39.1  --  40.4 33.6*  PLT 215 225  --  196 145*    COAGS:  Recent Labs  05/31/14 1255  09/18/14 10/05/14 10/17/14 1643 10/18/14 0355  INR 3.16  < > 3.5* 2.8* 3.05 1.51  APTT 35  --   --   --  49* 43*  < > = values in this interval not displayed.  BMP:  Recent Labs  06/03/14 0519 06/04/14 0510 10/17/14 1515 10/18/14 0355  NA 134* 135 132* 137  K 4.3 4.4 3.4* 2.9*  CL 98* 103 94* 101  CO2 GLUCOSE 102* 114* 137* 99  BUN 42* 44* 45* 35*  CALCIUM 8.5* 8.7* 8.9 8.1*  CREATININE 1.14* 0.82 1.47* 1.05*  GFRNONAA 41* >60 30* 46*  GFRAA 48* >60 35* 53*    LIVER FUNCTION TESTS:  Recent Labs  05/31/14 1255 10/17/14 1515 10/18/14 0355  BILITOT 0.8 1.3* 1.6*  AST 30 54* 29  ALT 23 28 27  ALKPHOS 113 132* 124  PROT 7.7 7.2 6.2*  ALBUMIN 4.1 3.8 3.3*    TUMOR MARKERS: No results for input(s): AFPTM, CEA, CA199, CHROMGRNA in the last 8760 hours.  Assessment and Plan:   81 yo with evidence for acute cholecystitis and not a good surgical candidate at this time.  Reviewed patient's CT and agree with gallbladder inflammation.  Patient is a candidate for CT guided cholecystostomy tube placement.  CT is needed to avoid nearby bowel structures.  Discussed the drain placement with patient and family in depth.  Discussed risks which include bleeding, infection and sepsis.  Patient's INR is down to 1.5 and safe for percutaneous drain placement.   Patient and family understand that she will need the drain for at least 2 months or until the gallbladder can be surgically removed.  Needing the drain indefinitely was also discussed with patient and family.  Informed consent obtained for cholecystostomy tube placement.  Plan for tube placement later today.    SignedAbundio Miu 10/18/2014, 12:15 PM

## 2014-10-18 NOTE — Progress Notes (Signed)
   10/18/14 1500  Clinical Encounter Type  Visited With Patient and family together  Visit Type Initial;Spiritual support  Referral From Nurse  Consult/Referral To Chaplain  Stress Factors  Patient Stress Factors None identified  Family Stress Factors Health changes  Chaplain went in to visit with patient, spouse and family. Offered spiritual support. Patient asked me to keep her in prayer and thanked me for the visit. Chaplain Sonya A.. Laws Ext. [phone]

## 2014-10-18 NOTE — Progress Notes (Signed)
Electrolyte management note  9/21 AM K+ 3.3 and Ca 8.1( 8.7 corrected for albumin of 3.3.)  40 mEq KCI and 1 gram calcium gluconate x1 ordered. Recheck K+ at 12:00. BMP in AM  Fulton Reek, PharmD, BCPS  10/18/2014

## 2014-10-18 NOTE — Progress Notes (Signed)
   10/18/14 1100  Clinical Encounter Type  Visited With Patient not available  Visit Type Initial  Referral From Nurse  Consult/Referral To Chaplain  Chaplain went in to visit with patient and family but they were asleep when I entered after knocking. I have stopped by several times but additional medical personnel were present (doctors, dietary, etc.) Will try again later. Chaplain Sonya A. Laws Ext. [phone]

## 2014-10-18 NOTE — Progress Notes (Signed)
ANTIBIOTIC CONSULT NOTE - INITIAL  Pharmacy Consult for Meropenem Indication: cholecystitis  Allergies  Allergen Reactions  . Amoxicillin Other (See Comments)    Reaction:  Weakness  . Digoxin Other (See Comments)    Reaction:  Weakness and dehydration  . Risedronate Sodium Rash    Patient Measurements: Height:  (160 cm) Weight: 102 lb 4.7 oz (46.4 kg) IBW/kg (Calculated) : 52.4   Vital Signs: Temp: 98.6 F (37 C) (09/21 0800) Temp Source: Oral (09/21 0800) BP: 113/61 mmHg (09/21 0600) Pulse Rate: 86 (09/21 0600) Intake/Output from previous day: 09/20 0701 - 09/21 0700 In: 768 [Blood:518; IV Piggyback:250] Out: 800 [Urine:800] Intake/Output from this shift: Total I/O In: -  Out: 400 [Urine:400]  Labs:  Recent Labs  10/17/14 1515 10/18/14 0355  WBC 19.7* 13.3*  HGB 13.6 11.5*  PLT 196 145*  CREATININE 1.47* 1.05*   Estimated Creatinine Clearance: 26.6 mL/min (by C-G formula based on Cr of 1.05). No results for input(s): VANCOTROUGH, VANCOPEAK, VANCORANDOM, GENTTROUGH, GENTPEAK, GENTRANDOM, TOBRATROUGH, TOBRAPEAK, TOBRARND, AMIKACINPEAK, AMIKACINTROU, AMIKACIN in the last 72 hours.   Microbiology: Recent Results (from the past 720 hour(s))  MRSA PCR Screening     Status: None   Collection Time: 10/17/14  9:17 PM  Result Value Ref Range Status   MRSA by PCR NEGATIVE NEGATIVE Final    Comment:        The GeneXpert MRSA Assay (FDA approved for NASAL specimens only), is one component of a comprehensive MRSA colonization surveillance program. It is not intended to diagnose MRSA infection nor to guide or monitor treatment for MRSA infections.     Medical History: Past Medical History  Diagnosis Date  . Permanent atrial fibrillation     a. Chronic Coumadin.  [name] HTN (hypertension)   . Chronic diastolic CHF (congestive heart failure), NYHA class 1     a. 04/2013 Echo: EF 50-55%, triv MR, mildly dil LA, PASP , mild TR, mild-mod RA dil.  [name] CAD  (coronary artery disease)     a. 2005 Cath: 100% dLCX-->Med managed.  . Sick sinus syndrome     a. 05/2008 s/p MDT EnRhythm DC PPM ([facility]).  . Chronic pain     a. ? due to arthritis  . Glaucoma   . Hyperlipidemia   . Diverticulitis large intestine   . Allergy   . Phlebitis     a. after pacemeker placement in 2010.  [name] Colon polyps   . Hypothyroidism   . Transfusion history     a. After childbirth 60 years ago (1957)  . Rash/skin eruption     a. Multiple episodes - wide distribution-intermittent, possibly drug rash.  . Pulmonary HTN     a. PASP on echo 04/2013.  [name] Chronic Right Pleural Effusion     a. 07/2012 s/p thoracentesis - Followed by Dr. Delford Field [facility] Pulmonology.  . Arthritis   . Bronchitis   . Hay fever   . Colon polyps   . History of blood transfusion   . Urine incontinence   . Back complaints     Medications:  Scheduled:  Assessment: 81 yo female admitted with acute cholecystitis.  Pharmacy consulted for meropenem dosing.  Plan:  Will increase meropenem to 1 g iv q 12 hours for improved renal function. Will monitor for changes in renal function and adjust dose as needed. Expected duration 7 days with resolution of temperature and/or normalization of WBC   Pharmacy will continue to follow with you.  [place],  Denyce Robert, PharmD Clinical Pharmacist 10/18/2014,8:29 AM

## 2014-10-18 NOTE — Progress Notes (Signed)
Initial Nutrition Assessment  DOCUMENTATION CODES:   Non-severe (moderate) malnutrition in context of chronic illness  INTERVENTION:  Meals and snacks: Await diet progression    NUTRITION DIAGNOSIS:   Inadequate oral intake related to altered GI function as evidenced by NPO status.    GOAL:   Patient will meet greater than or equal to 90% of their needs    MONITOR:    (Energy intake, Digestive system)  REASON FOR ASSESSMENT:   Malnutrition Screening Tool    ASSESSMENT:      Pt admitted with acute cholecystitis, planning to place drain vs surgery at this time  Past Medical History  Diagnosis Date  . Permanent atrial fibrillation     a. Chronic Coumadin.  [name] HTN (hypertension)   . Chronic diastolic CHF (congestive heart failure), NYHA class 1     a. 04/2013 Echo: EF 50-55%, triv MR, mildly dil LA, PASP , mild TR, mild-mod RA dil.  [name] CAD (coronary artery disease)     a. 2005 Cath: 100% dLCX-->Med managed.  . Sick sinus syndrome     a. 05/2008 s/p MDT EnRhythm DC PPM ([facility]).  . Chronic pain     a. ? due to arthritis  . Glaucoma   . Hyperlipidemia   . Diverticulitis large intestine   . Allergy   . Phlebitis     a. after pacemeker placement in 2010.  [name] Colon polyps   . Hypothyroidism   . Transfusion history     a. After childbirth 60 years ago (1955)  . Rash/skin eruption     a. Multiple episodes - wide distribution-intermittent, possibly drug rash.  . Pulmonary HTN     a. PASP on echo 04/2013.  [name] Chronic Right Pleural Effusion     a. 07/2012 s/p thoracentesis - Followed by Dr. Delford Field [facility] Pulmonology.  . Arthritis   . Bronchitis   . Hay fever   . Colon polyps   . History of blood transfusion   . Urine incontinence   . Back complaints     Current Nutrition: NPO  Food/Nutrition-Related History: Pt reports poor po intake for the past several days prior to admission secondary to abdominal pain   Medications: lasix, vit k,  KCL  Electrolyte/Renal Profile and Glucose Profile:   Recent Labs Lab 10/17/14 1515 10/17/14 2218 10/18/14 0355  NA 132*  --  137  K 3.4*  --  2.9*  CL 94*  --  101  CO2 27  --  26  BUN 45*  --  35*  CREATININE 1.47*  --  1.05*  CALCIUM 8.9  --  8.1*  MG  --  1.7 1.7  PHOS  --  2.7 2.7  GLUCOSE 137*  --  99   Protein Profile:   Recent Labs Lab 10/17/14 1515 10/18/14 0355  ALBUMIN 3.8 3.3*    Gastrointestinal Profile: Last BM: PTA   Nutrition-Focused Physical Exam Findings: Nutrition-Focused physical exam completed. Findings are normal to mild/moderate fat depletion, normal to mild/moderate muscle depletion, and none edema.      Weight Change: Noted per wt encounters 6% weight loss in the last 9  months    Diet Order:  Diet NPO time specified  Skin:   reviewed   Height:   Ht Readings from Last 1 Encounters:  10/17/14  (1.6 m)    Weight:   Wt Readings from Last 1 Encounters:  10/17/14 102 lb 4.7 oz (46.4 kg)  BMI:  Body mass index is 18.13 kg/(m^2).  Estimated Nutritional Needs:   Kcal:  BEE 854 kcals (IF 1.0-1.2, AF 1.3) 1610-9604 kcals/d  Protein:  (1.1-1.3 g/kg) 51-60 g/d  Fluid:  (30-13ml/kg) 1380-1619ml/d  EDUCATION NEEDS:   No education needs identified at this time  MODERATE Care Level  Joli B. Freida Busman, RD, LDN [phone] (pager)

## 2014-10-18 NOTE — Procedures (Signed)
Post-Procedure Note  Pre-operative Diagnosis: Acute cholecystitis       Post-operative Diagnosis: Acute cholecystitis and concern for gallbladder rupture   Indications: Cholecystitis on CT and physical exam.    Procedure Details:   CT guided drain was placed in gallbladder.  40 ml of dark bile was aspirated.  No immediate complication.  Findings: Distended gallbladder with surrounding fluid, concerning for rupture. 10 Fr drain placed in gallbladder.  Complications: No immediate.  EBL:  Minimal      Condition: Stable  Plan: Return to ICU. Send fluid for culture.

## 2014-10-18 NOTE — Care Management Note (Signed)
Case Management Note  Patient Details  Name: ZOFIA PECKINPAUGH MRN: [id number] Date of Birth: [birth year]  Subjective/Objective: Admitted with acute cholecystitis.  Plan is no surgery but placement of a drain. From the [facility] [place]. CSW updated. Will monitor progression.                 Action/Plan: [place].   Expected Discharge Date:                  Expected Discharge Plan:  Skilled Nursing Facility  In-House Referral:  Clinical Social Work  Discharge planning Services     Post Acute Care Choice:    Choice offered to:     DME Arranged:    DME Agency:     HH Arranged:    HH Agency:     Status of Service:  In process, will continue to follow  Medicare Important Message Given:    Date Medicare IM Given:    Medicare IM give by:    Date Additional Medicare IM Given:    Additional Medicare Important Message give by:     If discussed at Long Length of Stay Meetings, dates discussed:    Additional Comments:  Marily Memos, RN 10/18/2014, 11:11 AM

## 2014-10-18 NOTE — Progress Notes (Signed)
CC: Abdominal pain Subjective: 81 year old female admitted yesterday evening for acute cholecystitis. Patient was monitored in the stepdown unit while she underwent reversal of her chronic Coumadin use. She tolerated the reversal well from a cardiovascular standpoint. She continues to complain of abdominal pain which he states is the exact same as when she came into the hospital. Her only complaint is of abdominal pain and wanting to stop.  Objective: Vital signs in last 24 hours: Temp:  [97.5 F (36.4 C)-98.8 F (37.1 C)] 98.6 F (37 C) (09/21 0800) Pulse Rate:  [71-121] 88 (09/21 1000) Resp:  [16-30] 18 (09/21 1000) BP: (89-134)/(50-64) 134/63 mmHg (09/21 1000) SpO2:  [90 %-97 %] 90 % (09/21 1000) Weight:  [45.36 kg (100 lb)-46.4 kg (102 lb 4.7 oz)] 46.4 kg (102 lb 4.7 oz) (09/20 2100)    Intake/Output from previous day: 09/20 0701 - 09/21 0700 In: 768 [Blood:518; IV Piggyback:250] Out: 800 [Urine:800] Intake/Output this shift: Total I/O In: 300 [IV Piggyback:300] Out: 400 [Urine:400]  Physical exam:  Gen.: Resting in bed in no acute distress Lungs: Clear to auscultation Heart: Irregularly irregular Abdomen: Soft, tender to palpation in the right and left upper quadrant, mildly distended.  Lab Results: CBC   Recent Labs  10/17/14 1515 10/18/14 0355  WBC 19.7* 13.3*  HGB 13.6 11.5*  HCT 40.4 33.6*  PLT 196 145*   BMET  Recent Labs  10/17/14 1515 10/18/14 0355  NA 132* 137  K 3.4* 2.9*  CL 94* 101  CO2 27 26  GLUCOSE 137* 99  BUN 45* 35*  CREATININE 1.47* 1.05*  CALCIUM 8.9 8.1*   PT/INR  Recent Labs  10/17/14 1643 10/18/14 0355  LABPROT 31.6* 18.4*  INR 3.05 1.51   ABG No results for input(s): PHART, HCO3 in the last 72 hours.  Invalid input(s): PCO2, PO2  Studies/Results: Ct Abdomen Pelvis Wo Contrast  10/17/2014   CLINICAL DATA:  Right-sided abdominal pain.  EXAM: CT ABDOMEN AND PELVIS WITHOUT CONTRAST  TECHNIQUE: Multidetector CT imaging  of the abdomen and pelvis was performed following the standard protocol without IV contrast.  COMPARISON:  11/15/2012  FINDINGS: Lower chest: There is moderate to marked cardiac enlargement. Dual lead pacer device wires are identified within the right ventricle and right atrial appendage. There is a large right pleural effusion with overlying compressive type atelectasis. Subsegmental atelectasis versus scar noted in the left lower lobe.  Hepatobiliary: No focal liver abnormality identified. There is diffuse gallbladder wall thickening and pericholecystic edema/fluid noted. No gallstones identified. No significant biliary dilatation  Pancreas: Unremarkable appearance of the pancreas.  Spleen: No focal splenic abnormality.  Adrenals/Urinary Tract: The adrenal glands are normal. Normal appearance of the left kidney. The right kidney is normal. Urinary bladder appears within normal limits.  Stomach/Bowel: The stomach is normal. The small bowel loops have a normal caliber without evidence for bowel obstruction. Moderate stool burden identified throughout the colon. According to patient's clinical records there has been a previous appendectomy. Numerous distal colonic diverticula identified without acute inflammation.  Vascular/Lymphatic: Calcified atherosclerotic disease involves the abdominal aorta. No aneurysm. No enlarged retroperitoneal or mesenteric adenopathy. No enlarged pelvic or inguinal lymph nodes.  Reproductive: Previous hysterectomy.  No adnexal mass identified.  Other: There is no ascites or focal fluid collections within the abdomen or pelvis.  Musculoskeletal: The bones are diffusely osteopenic. There is a scoliosis deformity. Multi level lumbar degenerative disc disease is identified.  IMPRESSION: 1. The diffuse gallbladder wall thickening and pericholecystic fluid is noted. Findings  may reflect acute cholecystitis. If further imaging is clinically indicated consider right upper quadrant sonogram or  nuclear medicine hepatobiliary scan. 2. Cardiac enlargement an right pleural effusion. 3. Aortic atherosclerosis. 4. Scoliosis and lumbar degenerative disc disease.   Electronically Signed   By: Signa Kell M.D.   On: 10/17/2014 18:09    Anti-infectives: Anti-infectives    Start     Dose/Rate Route Frequency Ordered Stop   10/18/14 1000  meropenem (MERREM) 1 g in sodium chloride 0.9 % 100 mL IVPB     1 g 200 mL/hr over 30 Minutes Intravenous Every 12 hours 10/18/14 0832     10/17/14 2145  metroNIDAZOLE (FLAGYL) IVPB 500 mg     500 mg 100 mL/hr over 60 Minutes Intravenous Every 6 hours 10/17/14 2134     10/17/14 1830  meropenem (MERREM) 500 mg in sodium chloride 0.9 % 50 mL IVPB  Status:  Discontinued     500 mg 100 mL/hr over 30 Minutes Intravenous Every 12 hours 10/17/14 1812 10/18/14 1610      Assessment/Plan:  81 year old female multiple medical problems admitted for acute cholecystitis. Coumadin successfully reversed overnight with vitamin K and FFP with INR now 1.5 down from greater than 3 Appreciate critical care assistance with her Coumadin reversal and multiple medical problems. Given her multiple chronic medical problems significantly CHF and pulmonary hypertension, do not believe this patient to be a good surgical risk. Discussed with radiology about having a percutaneous cholecystostomy drain placed. It is my opinion this is the patient's most prudent course of action to allow her to recover from this acute attack. Appreciate pharmacy of systems was Merrem dosing. We'll continue in the stepdown unit for now. We'll reassess after drain placed.  Charles T. Tonita Cong, MD, FACS  10/18/2014

## 2014-10-19 ENCOUNTER — Encounter: Payer: Self-pay | Admitting: Internal Medicine

## 2014-10-19 LAB — BASIC METABOLIC PANEL
Anion gap: 8 (ref 5–15)
BUN: 19 mg/dL (ref 6–20)
CHLORIDE: 106 mmol/L (ref 101–111)
CO2: 24 mmol/L (ref 22–32)
Calcium: 8.2 mg/dL — ABNORMAL LOW (ref 8.9–10.3)
Creatinine, Ser: 0.66 mg/dL (ref 0.44–1.00)
GFR calc non Af Amer: >60 mL/min (ref >60)
Glucose, Bld: 87 mg/dL (ref 65–99)
POTASSIUM: 3.2 mmol/L — AB (ref 3.5–5.1)
SODIUM: 138 mmol/L (ref 135–145)

## 2014-10-19 LAB — PREPARE FRESH FROZEN PLASMA
UNIT DIVISION: 0
Unit division: 0
Unit division: 0

## 2014-10-19 LAB — CBC
HEMATOCRIT: 33.5 % — AB (ref 35.0–47.0)
HEMOGLOBIN: 11.4 g/dL — AB (ref 12.0–16.0)
MCH: 31 pg (ref 26.0–34.0)
MCHC: 34 g/dL (ref 32.0–36.0)
MCV: 91.2 fL (ref 80.0–100.0)
Platelets: 146 10*3/uL — ABNORMAL LOW (ref 150–440)
RBC: 3.68 MIL/uL — AB (ref 3.80–5.20)
RDW: 14 % (ref 11.5–14.5)
WBC: 9.3 10*3/uL (ref 3.6–11.0)

## 2014-10-19 LAB — MAGNESIUM: MAGNESIUM: 1.8 mg/dL (ref 1.7–2.4)

## 2014-10-19 LAB — PROTIME-INR
INR: 1.28
PROTHROMBIN TIME: 16.2 s — AB (ref 11.4–15.0)

## 2014-10-19 LAB — PHOSPHORUS: PHOSPHORUS: 2.3 mg/dL — AB (ref 2.5–4.6)

## 2014-10-19 MED ORDER — MORPHINE SULFATE (PF) 2 MG/ML IV SOLN
2.0000 mg | INTRAVENOUS | Status: DC | PRN
  Administered 2014-10-19 – 2014-10-20 (×4): 2 mg via INTRAVENOUS
  Filled 2014-10-19 (×5): qty 1

## 2014-10-19 MED ORDER — SODIUM CHLORIDE 0.9 % IV SOLN
1.0000 g | Freq: Once | INTRAVENOUS | Status: AC
Start: 2014-10-19 — End: 2014-10-19
  Administered 2014-10-19: 1 g via INTRAVENOUS
  Filled 2014-10-19: qty 10

## 2014-10-19 MED ORDER — FUROSEMIDE 40 MG PO TABS
40.0000 mg | ORAL_TABLET | Freq: Every day | ORAL | Status: DC
  Administered 2014-10-20 – 2014-10-22 (×3): 40 mg via ORAL
  Filled 2014-10-19 (×3): qty 1

## 2014-10-19 MED ORDER — WARFARIN - PHARMACIST DOSING INPATIENT
Freq: Every day | Status: DC
  Administered 2014-10-20 – 2014-10-22 (×3)

## 2014-10-19 MED ORDER — HYDROCODONE-ACETAMINOPHEN 10-325 MG PO TABS
1.0000 | ORAL_TABLET | Freq: Four times a day (QID) | ORAL | Status: DC | PRN
  Administered 2014-10-19 – 2014-10-23 (×9): 1 via ORAL
  Filled 2014-10-19 (×9): qty 1

## 2014-10-19 MED ORDER — DILTIAZEM HCL ER COATED BEADS 180 MG PO CP24
180.0000 mg | ORAL_CAPSULE | Freq: Every day | ORAL | Status: DC
  Administered 2014-10-19 – 2014-10-24 (×6): 180 mg via ORAL
  Filled 2014-10-19 (×6): qty 1

## 2014-10-19 MED ORDER — MAGNESIUM OXIDE 400 (241.3 MG) MG PO TABS
400.0000 mg | ORAL_TABLET | Freq: Two times a day (BID) | ORAL | Status: DC
  Administered 2014-10-19 – 2014-10-24 (×11): 400 mg via ORAL
  Filled 2014-10-19 (×11): qty 1

## 2014-10-19 MED ORDER — POTASSIUM CHLORIDE CRYS ER 20 MEQ PO TBCR
40.0000 meq | EXTENDED_RELEASE_TABLET | Freq: Once | ORAL | Status: DC
  Filled 2014-10-19: qty 2

## 2014-10-19 MED ORDER — WARFARIN SODIUM 5 MG PO TABS
5.0000 mg | ORAL_TABLET | ORAL | Status: DC
  Administered 2014-10-19 – 2014-10-22 (×3): 5 mg via ORAL
  Filled 2014-10-19 (×3): qty 1

## 2014-10-19 MED ORDER — POTASSIUM PHOSPHATES 15 MMOLE/5ML IV SOLN
40.0000 meq | Freq: Once | INTRAVENOUS | Status: AC
  Administered 2014-10-19: 40 meq via INTRAVENOUS
  Filled 2014-10-19: qty 9.09

## 2014-10-19 MED ORDER — POTASSIUM CHLORIDE CRYS ER 10 MEQ PO TBCR
10.0000 meq | EXTENDED_RELEASE_TABLET | Freq: Every day | ORAL | Status: DC
  Administered 2014-10-20 – 2014-10-24 (×5): 10 meq via ORAL
  Filled 2014-10-19 (×6): qty 1

## 2014-10-19 MED ORDER — LEVOTHYROXINE SODIUM 112 MCG PO TABS
112.0000 ug | ORAL_TABLET | Freq: Every day | ORAL | Status: DC
  Administered 2014-10-20 – 2014-10-24 (×5): 112 ug via ORAL
  Filled 2014-10-19 (×6): qty 1

## 2014-10-19 MED ORDER — ATORVASTATIN CALCIUM 20 MG PO TABS
40.0000 mg | ORAL_TABLET | Freq: Every day | ORAL | Status: DC
  Administered 2014-10-19 – 2014-10-23 (×5): 40 mg via ORAL
  Filled 2014-10-19 (×5): qty 2

## 2014-10-19 NOTE — Consult Note (Signed)
[facility] - [place] [facility]   PATIENT NAME: Laurie Larsen    MR#:  [id number]  DATE OF BIRTH:  [birth year]  DATE OF ADMISSION:  10/17/2014  PRIMARY CARE PHYSICIAN: Dr. Babette Relic  REQUESTING/REFERRING PHYSICIAN: Ricarda Frame  CHIEF COMPLAINT:   Chief Complaint  Patient presents with  . Abdominal Pain    HISTORY OF PRESENT ILLNESS:  Laurie Larsen  is a 81 y.o. female with multiple medical problems. Medical consultation was obtained for restarting medications and managing medical issues. The patient was admitted on 10/17/2014 by the surgical service for acute cholecystitis. Her Coumadin was reversed with FFP and she had a percutaneous gallbladder drain placed by interventional radiology on 10/18/2014. The patient is still having exquisite severe abdominal pain that is brought down to a 5 out of 10 with pain medication. As per the nurse, she is not taking much of the PCA pump. Prior to admission she is had decreased appetite and some weight loss and some nausea vomiting and severe abdominal pain. Medical team consulted for restarting medications.  PAST MEDICAL HISTORY:   Past Medical History  Diagnosis Date  . Permanent atrial fibrillation     a. Chronic Coumadin.  [name] HTN (hypertension)   . Chronic diastolic CHF (congestive heart failure), NYHA class 1     a. 04/2013 Echo: EF 50-55%, triv MR, mildly dil LA, PASP , mild TR, mild-mod RA dil.  [name] CAD (coronary artery disease)     a. 2005 Cath: 100% dLCX-->Med managed.  . Sick sinus syndrome     a. 05/2008 s/p MDT EnRhythm DC PPM ([facility]).  . Chronic pain     a. ? due to arthritis  . Glaucoma   . Hyperlipidemia   . Diverticulitis large intestine   . Allergy   . Phlebitis     a. after pacemeker placement in 2010.  [name] Colon polyps   . Hypothyroidism   . Transfusion history     a. After childbirth 60 years ago (1957)  . Rash/skin eruption     a. Multiple episodes - wide  distribution-intermittent, possibly drug rash.  . Pulmonary HTN     a. PASP on echo 04/2013.  [name] Chronic Right Pleural Effusion     a. 07/2012 s/p thoracentesis - Followed by Dr. Delford Field [facility] Pulmonology.  . Arthritis   . Bronchitis   . Hay fever   . Colon polyps   . History of blood transfusion   . Urine incontinence   . Back complaints     PAST SURGICAL HISTOIRY:   Past Surgical History  Procedure Laterality Date  . Appendectomy  1939  . Abdominal hysterectomy  1977  . Tonsillectomy  81 yrs old  . Kidney surgery  1955    Right  . Ankle fracture surgery  1979    Left, pin  . Pacemaker insertion  06/20/08    MDT EnRhythm DR implanted by Dr Reyes Ivan  . Breast surgery      Needle guided excision, right breast calcification  . Breast fibroadenoma surgery  2010    Right  . Skin graft      left leg  . Breast biopsy  2011    SOCIAL HISTORY:   Social History  Substance Use Topics  . Smoking status: Never Smoker   . Smokeless tobacco: Never Used  . Alcohol Use: No    FAMILY HISTORY:   Family History  Problem Relation Age of Onset  . Cancer Mother  colon with mets  . Diabetes Mother   . Heart disease Father   . Hyperlipidemia Father   . Hypertension Father   . Stroke Brother     brother who died May 20, 2022  . Cancer Paternal Aunt     breast  . Cancer Cousin     uterine, lung cancer  . Heart attack Brother   . Heart attack Father     DRUG ALLERGIES:   Allergies  Allergen Reactions  . Amoxicillin Other (See Comments)    Reaction:  Weakness  . Digoxin Other (See Comments)    Reaction:  Weakness and dehydration  . Risedronate Sodium Rash    REVIEW OF SYSTEMS:  CONSTITUTIONAL: No fever, positive for fatigue. Positive for sweats. Positive for weight loss EYES: Poor vision with macular degeneration. She does wear glasses and also has glaucoma EARS, NOSE, AND THROAT: No tinnitus or ear pain. Decreased hearing. RESPIRATORY: No cough, positive for  shortness of breath, no wheezing or hemoptysis.  CARDIOVASCULAR: Occasional chest pain, no edema.  GASTROINTESTINAL: Positive for nausea, vomiting, and abdominal pain.  GENITOURINARY: No dysuria, hematuria.  ENDOCRINE: No polyuria, nocturia,  HEMATOLOGY: No anemia, easy bruising or bleeding SKIN: No rash or lesion. MUSCULOSKELETAL: Positive for low back pain NEUROLOGIC: No tingling, numbness, weakness.  PSYCHIATRY: No anxiety or some depression.   MEDICATIONS AT HOME:   Prior to Admission medications   Medication Sig Start Date End Date Taking? Authorizing Provider  acetaminophen (TYLENOL) 650 MG CR tablet Take 1,300 mg by mouth at bedtime as needed for pain.   Yes Historical Provider, MD  atenolol (TENORMIN) 25 MG tablet Take 1 tablet (25 mg total) by mouth 2 (two) times daily. 10/03/14  Yes Peter M [place], MD  atorvastatin (LIPITOR) 40 MG tablet Take 0.5 tablets (20 mg total) by mouth at bedtime. 08/23/14  Yes Tommie Sams, DO  diltiazem (CARDIZEM CD) 180 MG 24 hr capsule Take 1 capsule (180 mg total) by mouth daily. 06/29/14  Yes Peter M [place], MD  dorzolamide-timolol (COSOPT) 22.3-6.8 MG/ML ophthalmic solution Place 1 drop into both eyes 2 (two) times daily.    Yes Historical Provider, MD  furosemide (LASIX) 40 MG tablet Take 60 mg by mouth daily.    Yes Peter M [place], MD  HYDROcodone-acetaminophen [facility]) 10-325 MG per tablet Take 1-2 tablets by mouth 4 (four) times daily as needed for severe pain.    Yes Historical Provider, MD  levothyroxine (SYNTHROID, LEVOTHROID) 112 MCG tablet Take 1 tablet (112 mcg total) by mouth daily. 09/06/14  Yes Tommie Sams, DO  polyethylene glycol (MIRALAX / GLYCOLAX) packet Take 17 g by mouth at bedtime.    Yes Historical Provider, MD  potassium chloride (K-DUR) 10 MEQ tablet Take 10 mEq by mouth 2 (two) times daily.   Yes Historical Provider, MD  traMADol (ULTRAM) 50 MG tablet Take 50-100 mg by mouth 4 (four) times daily as needed for moderate pain.    Yes  Historical Provider, MD  Travoprost, BAK Free, (TRAVATAN) 0.004 % SOLN ophthalmic solution Place 1 drop into both eyes at bedtime.   Yes Historical Provider, MD  Vitamin D, Ergocalciferol, (DRISDOL) 50000 UNITS CAPS capsule Take 50,000 Units by mouth every 7 (seven) days. Pt takes on Saturday.   Yes Historical Provider, MD  warfarin (COUMADIN) 5 MG tablet TAKE AS DIRECTED BY COUMADIN CLINIC Patient taking differently: Take 2.5-5 mg by mouth every evening. Pt takes 2.5mg  on Saturday and  all other days. 08/03/14  Yes Demetria Pore  [place], MD      VITAL SIGNS:  Blood pressure 137/71, pulse 86, temperature 97.9 F (36.6 C), temperature source Axillary, resp. rate 14, height 5\' 3"  (1.6 m), weight 46.4 kg (102 lb 4.7 oz), SpO2 97 %.  PHYSICAL EXAMINATION:  GENERAL:  81 y.o.-year-old patient lying in the bed with no acute distress.  EYES: Pupils equal, round, reactive to light and accommodation. No scleral icterus. Extraocular muscles intact.  HEENT: Head atraumatic, normocephalic. Oropharynx and nasopharynx clear.  NECK:  Supple, no jugular venous distention. No thyroid enlargement, no tenderness.  LUNGS: Decreased breath sounds bilaterally, no wheezing, bilateral base rales, no rhonchi or crepitation. No use of accessory muscles of respiration.  CARDIOVASCULAR: S1, S2 irregularly irregular. 2/6 systolic murmurs, no rubs, or gallops.  ABDOMEN: Soft, extremely tender entire abdomen, very distended. Patient with guarding and rebound. EXTREMITIES: No pedal edema, cyanosis, or clubbing.  NEUROLOGIC: Cranial nerves II through XII are intact. Muscle strength 5/5 in all extremities. Sensation intact. Gait not checked.  PSYCHIATRIC: The patient is alert and oriented x 3.  SKIN: No obvious rash, lesion, or ulcer.   LABORATORY PANEL:   CBC  Recent Labs Lab 10/19/14 0513  WBC 9.3  HGB 11.4*  HCT 33.5*  PLT 146*    ------------------------------------------------------------------------------------------------------------------  Chemistries   Recent Labs Lab 10/18/14 0355  10/19/14 0513  NA 137  --  138  K 2.9*  < > 3.2*  CL 101  --  106  CO2 26  --  24  GLUCOSE 99  --  87  BUN 35*  --  19  CREATININE 1.05*  --  0.66  CALCIUM 8.1*  --  8.2*  MG 1.7  --  1.8  AST 29  --   --   ALT 27  --   --   ALKPHOS 124  --   --   BILITOT 1.6*  --   --   < > = values in this interval not displayed. ------------------------------------------------------------------------------------------------------------------   RADIOLOGY:  Ct Abdomen Pelvis Wo Contrast  10/17/2014   CLINICAL DATA:  Right-sided abdominal pain.  EXAM: CT ABDOMEN AND PELVIS WITHOUT CONTRAST  TECHNIQUE: Multidetector CT imaging of the abdomen and pelvis was performed following the standard protocol without IV contrast.  COMPARISON:  11/15/2012  FINDINGS: Lower chest: There is moderate to marked cardiac enlargement. Dual lead pacer device wires are identified within the right ventricle and right atrial appendage. There is a large right pleural effusion with overlying compressive type atelectasis. Subsegmental atelectasis versus scar noted in the left lower lobe.  Hepatobiliary: No focal liver abnormality identified. There is diffuse gallbladder wall thickening and pericholecystic edema/fluid noted. No gallstones identified. No significant biliary dilatation  Pancreas: Unremarkable appearance of the pancreas.  Spleen: No focal splenic abnormality.  Adrenals/Urinary Tract: The adrenal glands are normal. Normal appearance of the left kidney. The right kidney is normal. Urinary bladder appears within normal limits.  Stomach/Bowel: The stomach is normal. The small bowel loops have a normal caliber without evidence for bowel obstruction. Moderate stool burden identified throughout the colon. According to patient's clinical records there has been a  previous appendectomy. Numerous distal colonic diverticula identified without acute inflammation.  Vascular/Lymphatic: Calcified atherosclerotic disease involves the abdominal aorta. No aneurysm. No enlarged retroperitoneal or mesenteric adenopathy. No enlarged pelvic or inguinal lymph nodes.  Reproductive: Previous hysterectomy.  No adnexal mass identified.  Other: There is no ascites or focal fluid collections within the abdomen or pelvis.  Musculoskeletal: The bones are  diffusely osteopenic. There is a scoliosis deformity. Multi level lumbar degenerative disc disease is identified.  IMPRESSION: 1. The diffuse gallbladder wall thickening and pericholecystic fluid is noted. Findings may reflect acute cholecystitis. If further imaging is clinically indicated consider right upper quadrant sonogram or nuclear medicine hepatobiliary scan. 2. Cardiac enlargement an right pleural effusion. 3. Aortic atherosclerosis. 4. Scoliosis and lumbar degenerative disc disease.   Electronically Signed   By: Signa Kell M.D.   On: 10/17/2014 18:09   Ct Image Guided Drainage By Percutaneous Catheter  10/18/2014   CLINICAL DATA:  81 year old with multiple medical problems including acute cholecystitis. Patient is not a candidate for cholecystectomy at this time.  EXAM: CT GUIDED CHOLECYSTOSTOMY TUBE PLACEMENT  Physician: Rachelle Hora. Lowella Dandy, MD  FLUOROSCOPY TIME:  None  MEDICATIONS: 1 mg Versed, 75 mcg fentanyl. A radiology nurse monitored the patient for moderate sedation.  ANESTHESIA/SEDATION: Moderate sedation time: 40 minutes  PROCEDURE: The procedure was explained to the patient. The risks and benefits of the procedure were discussed and the patient's questions were addressed. Informed consent was obtained from the patient. Patient was placed supine on the CT scanner. Images through the abdomen were obtained.  The right anterior abdomen was prepped and draped in a sterile fashion. Maximal barrier sterile technique was utilized  including mask, sterile gowns, sterile gloves, sterile drape, hand hygiene and skin antiseptic. The right upper abdomen was anesthetized with 1% lidocaine. An 18 gauge needle was directed into the gallbladder through the inferior aspect of the medial left hepatic lobe. Stiff Amplatz wire was advanced into the gallbladder and placement confirmed with follow-up CT images. 40 mL of dark bile was aspirated. Catheter was sutured to skin and attached to gravity bag. Fluid sample sent for culture. Follow up CT images were obtained. Bandage was placed over the drain site.  FINDINGS: The gallbladder is distended with pericholecystic fluid and a small amount of perihepatic fluid. Findings raise concern for gallbladder rupture or perforation. Needle and wire were directed through the inferior aspect of the medial left hepatic lobe and into the gallbladder. Wire and catheter placement confirmed in the gallbladder. The gallbladder was decompressed at the end of the procedure.  Estimated blood loss: Minimal  COMPLICATIONS: None  IMPRESSION: Successful CT-guided cholecystostomy tube placement.  Bile sample sent for culture.  Fluid around the gallbladder and liver raise concern for gallbladder rupture or perforation.   Electronically Signed   By: Richarda Overlie M.D.   On: 10/18/2014 14:29    EKG:   Not done  IMPRESSION AND PLAN:   1. Atrial fibrillation. I spoke with surgery and it is okay to restart anticoagulation. I will restart Coumadin 5 mg daily and check INR daily and pharmacy to dose the Coumadin. I will restart Cardizem CD 180 mg daily. Hold on the atenolol at this point. 2. Hypokalemia, hypomagnesemia- replace oral potassium and magnesium. As diet is advanced hopefully this won't be much of an issue. 3. Hyperlipidemia unspecified restart atorvastatin. 4. Hypothyroidism unspecified- restart levothyroxine. 5. History of diastolic congestive heart failure. Can restart oral diuresis tomorrow. 6. Patient wears oxygen  at night. 7. Glaucoma unspecified continue eyedrops. 8. Acute cholecystitis- patient is on appropriate antibiotics. Surgical team is going to manage conservatively with the percutaneous drainage tube at this point.   All the records are reviewed and case discussed with Consulting provider. Management plans discussed with the patient, family and they are in agreement.  CODE STATUS: Full code  TOTAL TIME TAKING CARE OF THIS  PATIENT: 50 minutes.    Alford Highland M.D on 10/19/2014 at 2:41 PM  Between 7am to 6pm - Pager - [phone]  After 6pm go to [email or web address] - password [email or web address] [facility]  [facility]  Office  [phone]  CC: Primary care Physician: Dr. Babette Relic

## 2014-10-19 NOTE — Progress Notes (Signed)
Pharmacy Consult for Electrolyte Management  Allergies  Allergen Reactions  . Amoxicillin Other (See Comments)    Reaction:  Weakness  . Digoxin Other (See Comments)    Reaction:  Weakness and dehydration  . Risedronate Sodium Rash    Patient Measurements: Height:  (160 cm) Weight: 102 lb 4.7 oz (46.4 kg) IBW/kg (Calculated) : 52.4  Vital Signs: Temp: 98.1 F (36.7 C) (09/22 0800) Temp Source: Oral (09/22 0800) BP: 146/86 mmHg (09/22 1100) Pulse Rate: 96 (09/22 1100) Intake/Output from previous day: 09/21 0701 - 09/22 0700 In: 1380 [P.O.:480; IV Piggyback:900] Out: 1210 [Urine:1000; Drains:210] Intake/Output from this shift: Total I/O In: 509.1 [IV Piggyback:509.1] Out: 90 [Drains:90]  Labs:  Recent Labs  10/17/14 1515 10/17/14 1643 10/18/14 0355 10/19/14 0513  WBC 19.7*  --  13.3* 9.3  HGB 13.6  --  11.5* 11.4*  HCT 40.4  --  33.6* 33.5*  PLT 196  --  145* 146*  APTT  --  49* 43*  --   INR  --  3.05 1.51  --      Recent Labs  10/17/14 1515 10/17/14 2218 10/18/14 0355 10/18/14 1458 10/19/14 0513  NA 132*  --  137  --  138  K 3.4*  --  2.9* 3.4* 3.2*  CL 94*  --  101  --  106  CO2 27  --  26  --  24  GLUCOSE 137*  --  99  --  87  BUN 45*  --  35*  --  19  CREATININE 1.47*  --  1.05*  --  0.66  CALCIUM 8.9  --  8.1*  --  8.2*  MG  --  1.7 1.7  --  1.8  PHOS  --  2.7 2.7  --  2.3*  PROT 7.2  --  6.2*  --   --   ALBUMIN 3.8  --  3.3*  --   --   AST 54*  --  29  --   --   ALT 28  --  27  --   --   ALKPHOS 132*  --  124  --   --   BILITOT 1.3*  --  1.6*  --   --    Estimated Creatinine Clearance: 34.9 mL/min (by C-G formula based on Cr of 0.66).   No results for input(s): GLUCAP in the last 72 hours.  Medical History: Past Medical History  Diagnosis Date  . Permanent atrial fibrillation     a. Chronic Coumadin.  [name] HTN (hypertension)   . Chronic diastolic CHF (congestive heart failure), NYHA class 1     a. 04/2013 Echo: EF 50-55%, triv  MR, mildly dil LA, PASP , mild TR, mild-mod RA dil.  [name] CAD (coronary artery disease)     a. 2005 Cath: 100% dLCX-->Med managed.  . Sick sinus syndrome     a. 05/2008 s/p MDT EnRhythm DC PPM ([facility]).  . Chronic pain     a. ? due to arthritis  . Glaucoma   . Hyperlipidemia   . Diverticulitis large intestine   . Allergy   . Phlebitis     a. after pacemeker placement in 2010.  [name] Colon polyps   . Hypothyroidism   . Transfusion history     a. After childbirth 60 years ago (1957)  . Rash/skin eruption     a. Multiple episodes - wide distribution-intermittent, possibly drug rash.  . Pulmonary HTN     a.  PASP on echo 04/2013.  [name] Chronic Right Pleural Effusion     a. 07/2012 s/p thoracentesis - Followed by Dr. Delford Field [facility] Pulmonology.  . Arthritis   . Bronchitis   . Hay fever   . Colon polyps   . History of blood transfusion   . Urine incontinence   . Back complaints     Medications:  Scheduled:  . antiseptic oral rinse  7 mL Mouth Rinse q12n4p  . calcium gluconate  1 g Intravenous Once  . chlorhexidine  15 mL Mouth Rinse BID  . dorzolamide-timolol  1 drop Both Eyes BID  . latanoprost  1 drop Both Eyes QHS  . meropenem (MERREM) IV  1 g Intravenous Q12H  . metronidazole  500 mg Intravenous Q6H  . morphine   Intravenous 6 times per day  . potassium phosphate IVPB (mEq)  40 mEq Intravenous Once   Infusions:   PRN: diphenhydrAMINE **OR** diphenhydrAMINE, hydrALAZINE, morphine injection, naloxone **AND** sodium chloride, ondansetron **OR** ondansetron (ZOFRAN) IV  Assessment: Pharmacy consulted to assist in managing electrolytes in this 81 y/o F with acute cholecystitis.   Plan:  Corrected calcium is 8.7 after 1 gram of calcium gluconate yesterday so will repeat 1 gram today. Potassium and phosphorus are both slightly low so will replace with KPhos 40 meq iv once. Will f/u AM labs.   Luisa Hart D 10/19/2014,12:05 PM

## 2014-10-19 NOTE — Progress Notes (Signed)
CC: Cholecystitis Subjective: 81 year old female admitted to the surgery service for acute cholecystitis. Due to her multiple medical problems she underwent a percutaneous cholecystostomy drain placement yesterday. Patient tolerated the procedure but had significant pain. Pain has improved with institution of PCA. Patient's only complaint this morning is still of abdominal pain. She states that this is better than when she came in.  Objective: Vital signs in last 24 hours: Temp:  [97.7 F (36.5 C)-98.2 F (36.8 C)] 98.1 F (36.7 C) (09/22 0800) Pulse Rate:  [79-117] 96 (09/22 1100) Resp:  [12-27] 15 (09/22 1100) BP: (111-178)/(47-94) 146/86 mmHg (09/22 1100) SpO2:  [89 %-99 %] 96 % (09/22 1100) FiO2 (%):  [94 %] 94 % (09/21 1742)    Intake/Output from previous day: 09/21 0701 - 09/22 0700 In: 1380 [P.O.:480; IV Piggyback:900] Out: 1210 [Urine:1000; Drains:210] Intake/Output this shift: Total I/O In: 509.1 [IV Piggyback:509.1] Out: 90 [Drains:90]  Physical exam:  General no acute distress Chest: Clear to auscultation Heart: Irregularly irregular Abdomen: Soft, tender to palpation in the right upper quadrant, peritonitis cholecystostomy drain in place with bilious output.  Lab Results: CBC   Recent Labs  10/18/14 0355 10/19/14 0513  WBC 13.3* 9.3  HGB 11.5* 11.4*  HCT 33.6* 33.5*  PLT 145* 146*   BMET  Recent Labs  10/18/14 0355 10/18/14 1458 10/19/14 0513  NA 137  --  138  K 2.9* 3.4* 3.2*  CL 101  --  106  CO2 26  --  24  GLUCOSE 99  --  87  BUN 35*  --  19  CREATININE 1.05*  --  0.66  CALCIUM 8.1*  --  8.2*   PT/INR  Recent Labs  10/17/14 1643 10/18/14 0355  LABPROT 31.6* 18.4*  INR 3.05 1.51   ABG No results for input(s): PHART, HCO3 in the last 72 hours.  Invalid input(s): PCO2, PO2  Studies/Results: Ct Abdomen Pelvis Wo Contrast  10/17/2014   CLINICAL DATA:  Right-sided abdominal pain.  EXAM: CT ABDOMEN AND PELVIS WITHOUT CONTRAST   TECHNIQUE: Multidetector CT imaging of the abdomen and pelvis was performed following the standard protocol without IV contrast.  COMPARISON:  11/15/2012  FINDINGS: Lower chest: There is moderate to marked cardiac enlargement. Dual lead pacer device wires are identified within the right ventricle and right atrial appendage. There is a large right pleural effusion with overlying compressive type atelectasis. Subsegmental atelectasis versus scar noted in the left lower lobe.  Hepatobiliary: No focal liver abnormality identified. There is diffuse gallbladder wall thickening and pericholecystic edema/fluid noted. No gallstones identified. No significant biliary dilatation  Pancreas: Unremarkable appearance of the pancreas.  Spleen: No focal splenic abnormality.  Adrenals/Urinary Tract: The adrenal glands are normal. Normal appearance of the left kidney. The right kidney is normal. Urinary bladder appears within normal limits.  Stomach/Bowel: The stomach is normal. The small bowel loops have a normal caliber without evidence for bowel obstruction. Moderate stool burden identified throughout the colon. According to patient's clinical records there has been a previous appendectomy. Numerous distal colonic diverticula identified without acute inflammation.  Vascular/Lymphatic: Calcified atherosclerotic disease involves the abdominal aorta. No aneurysm. No enlarged retroperitoneal or mesenteric adenopathy. No enlarged pelvic or inguinal lymph nodes.  Reproductive: Previous hysterectomy.  No adnexal mass identified.  Other: There is no ascites or focal fluid collections within the abdomen or pelvis.  Musculoskeletal: The bones are diffusely osteopenic. There is a scoliosis deformity. Multi level lumbar degenerative disc disease is identified.  IMPRESSION: 1. The diffuse  gallbladder wall thickening and pericholecystic fluid is noted. Findings may reflect acute cholecystitis. If further imaging is clinically indicated consider  right upper quadrant sonogram or nuclear medicine hepatobiliary scan. 2. Cardiac enlargement an right pleural effusion. 3. Aortic atherosclerosis. 4. Scoliosis and lumbar degenerative disc disease.   Electronically Signed   By: Signa Kell M.D.   On: 10/17/2014 18:09   Ct Image Guided Drainage By Percutaneous Catheter  10/18/2014   CLINICAL DATA:  81 year old with multiple medical problems including acute cholecystitis. Patient is not a candidate for cholecystectomy at this time.  EXAM: CT GUIDED CHOLECYSTOSTOMY TUBE PLACEMENT  Physician: Rachelle Hora. Lowella Dandy, MD  FLUOROSCOPY TIME:  None  MEDICATIONS: 1 mg Versed, 75 mcg fentanyl. A radiology nurse monitored the patient for moderate sedation.  ANESTHESIA/SEDATION: Moderate sedation time: 40 minutes  PROCEDURE: The procedure was explained to the patient. The risks and benefits of the procedure were discussed and the patient's questions were addressed. Informed consent was obtained from the patient. Patient was placed supine on the CT scanner. Images through the abdomen were obtained.  The right anterior abdomen was prepped and draped in a sterile fashion. Maximal barrier sterile technique was utilized including mask, sterile gowns, sterile gloves, sterile drape, hand hygiene and skin antiseptic. The right upper abdomen was anesthetized with 1% lidocaine. An 18 gauge needle was directed into the gallbladder through the inferior aspect of the medial left hepatic lobe. Stiff Amplatz wire was advanced into the gallbladder and placement confirmed with follow-up CT images. 40 mL of dark bile was aspirated. Catheter was sutured to skin and attached to gravity bag. Fluid sample sent for culture. Follow up CT images were obtained. Bandage was placed over the drain site.  FINDINGS: The gallbladder is distended with pericholecystic fluid and a small amount of perihepatic fluid. Findings raise concern for gallbladder rupture or perforation. Needle and wire were directed through  the inferior aspect of the medial left hepatic lobe and into the gallbladder. Wire and catheter placement confirmed in the gallbladder. The gallbladder was decompressed at the end of the procedure.  Estimated blood loss: Minimal  COMPLICATIONS: None  IMPRESSION: Successful CT-guided cholecystostomy tube placement.  Bile sample sent for culture.  Fluid around the gallbladder and liver raise concern for gallbladder rupture or perforation.   Electronically Signed   By: Richarda Overlie M.D.   On: 10/18/2014 14:29    Anti-infectives: Anti-infectives    Start     Dose/Rate Route Frequency Ordered Stop   10/18/14 1000  meropenem (MERREM) 1 g in sodium chloride 0.9 % 100 mL IVPB     1 g 200 mL/hr over 30 Minutes Intravenous Every 12 hours 10/18/14 0832     10/17/14 2145  metroNIDAZOLE (FLAGYL) IVPB 500 mg     500 mg 100 mL/hr over 60 Minutes Intravenous Every 6 hours 10/17/14 2134     10/17/14 1830  meropenem (MERREM) 500 mg in sodium chloride 0.9 % 50 mL IVPB  Status:  Discontinued     500 mg 100 mL/hr over 30 Minutes Intravenous Every 12 hours 10/17/14 1812 10/18/14 1610      Assessment/Plan:   81 year old female with acute cholecystitis status post percutaneous cholecystostomy drain improved from admission. Plan to transfer from ICU to the ward Will start a diet and restart home medications. We'll continue antibiotics for 2 weeks course We'll need to start on Lovenox while Coumadin returns to therapeutic level.   Charles T. Tonita Cong, MD, FACS  10/19/2014

## 2014-10-19 NOTE — Progress Notes (Signed)
ANTIBIOTIC CONSULT NOTE - INITIAL  Pharmacy Consult for Meropenem Indication: cholecystitis  Allergies  Allergen Reactions  . Amoxicillin Other (See Comments)    Reaction:  Weakness  . Digoxin Other (See Comments)    Reaction:  Weakness and dehydration  . Risedronate Sodium Rash    Patient Measurements: Height:  (160 cm) Weight: 102 lb 4.7 oz (46.4 kg) IBW/kg (Calculated) : 52.4   Vital Signs: Temp: 98.1 F (36.7 C) (09/22 0800) Temp Source: Oral (09/22 0800) BP: 157/87 mmHg (09/22 1200) Pulse Rate: 94 (09/22 1200) Intake/Output from previous day: 09/21 0701 - 09/22 0700 In: 1380 [P.O.:480; IV Piggyback:900] Out: 1210 [Urine:1000; Drains:210] Intake/Output from this shift: Total I/O In: 509.1 [IV Piggyback:509.1] Out: 90 [Drains:90]  Labs:  Recent Labs  10/17/14 1515 10/18/14 0355 10/19/14 0513  WBC 19.7* 13.3* 9.3  HGB 13.6 11.5* 11.4*  PLT 196 145* 146*  CREATININE 1.47* 1.05* 0.66   Estimated Creatinine Clearance: 34.9 mL/min (by C-G formula based on Cr of 0.66). No results for input(s): VANCOTROUGH, VANCOPEAK, VANCORANDOM, GENTTROUGH, GENTPEAK, GENTRANDOM, TOBRATROUGH, TOBRAPEAK, TOBRARND, AMIKACINPEAK, AMIKACINTROU, AMIKACIN in the last 72 hours.   Microbiology: Recent Results (from the past 720 hour(s))  MRSA PCR Screening     Status: None   Collection Time: 10/17/14  9:17 PM  Result Value Ref Range Status   MRSA by PCR NEGATIVE NEGATIVE Final    Comment:        The GeneXpert MRSA Assay (FDA approved for NASAL specimens only), is one component of a comprehensive MRSA colonization surveillance program. It is not intended to diagnose MRSA infection nor to guide or monitor treatment for MRSA infections.     Medical History: Past Medical History  Diagnosis Date  . Permanent atrial fibrillation     a. Chronic Coumadin.  [name] HTN (hypertension)   . Chronic diastolic CHF (congestive heart failure), NYHA class 1     a. 04/2013 Echo: EF  50-55%, triv MR, mildly dil LA, PASP , mild TR, mild-mod RA dil.  [name] CAD (coronary artery disease)     a. 2005 Cath: 100% dLCX-->Med managed.  . Sick sinus syndrome     a. 05/2008 s/p MDT EnRhythm DC PPM ([facility]).  . Chronic pain     a. ? due to arthritis  . Glaucoma   . Hyperlipidemia   . Diverticulitis large intestine   . Allergy   . Phlebitis     a. after pacemeker placement in 2010.  [name] Colon polyps   . Hypothyroidism   . Transfusion history     a. After childbirth 60 years ago (1957)  . Rash/skin eruption     a. Multiple episodes - wide distribution-intermittent, possibly drug rash.  . Pulmonary HTN     a. PASP on echo 04/2013.  [name] Chronic Right Pleural Effusion     a. 07/2012 s/p thoracentesis - Followed by Dr. Delford Field [facility] Pulmonology.  . Arthritis   . Bronchitis   . Hay fever   . Colon polyps   . History of blood transfusion   . Urine incontinence   . Back complaints     Medications:  Scheduled:  Assessment: 81 yo female admitted with acute cholecystitis.  Pharmacy consulted for meropenem dosing.  Plan:  Will continue meropenem 1 g iv q 12 hours. Will monitor for changes in renal function and adjust dose as needed. Expected duration 7 days with resolution of temperature and/or normalization of WBC   Pharmacy will continue to follow with  you.  Valentina Gu, PharmD Clinical Pharmacist 10/19/2014,12:10 PM

## 2014-10-19 NOTE — Progress Notes (Signed)
ANTICOAGULATION CONSULT NOTE - Initial Consult  Pharmacy Consult for Warfarin Indication: atrial fibrillation  Allergies  Allergen Reactions  . Amoxicillin Other (See Comments)    Reaction:  Weakness  . Digoxin Other (See Comments)    Reaction:  Weakness and dehydration  . Risedronate Sodium Rash    Patient Measurements: Height:  (160 cm) Weight: 102 lb 4.7 oz (46.4 kg) IBW/kg (Calculated) : 52.4   Vital Signs: Temp: 97.9 F (36.6 C) (09/22 1403) Temp Source: Axillary (09/22 1403) BP: 137/71 mmHg (09/22 1403) Pulse Rate: 86 (09/22 1403)  Labs:  Recent Labs  10/17/14 1515 10/17/14 1643 10/18/14 0355 10/19/14 0513 10/19/14 1454  HGB 13.6  --  11.5* 11.4*  --   HCT 40.4  --  33.6* 33.5*  --   PLT 196  --  145* 146*  --   APTT  --  49* 43*  --   --   LABPROT  --  31.6* 18.4*  --  16.2*  INR  --  3.05 1.51  --  1.28  CREATININE 1.47*  --  1.05* 0.66  --     Estimated Creatinine Clearance: 34.9 mL/min (by C-G formula based on Cr of 0.66).   Medical History: Past Medical History  Diagnosis Date  . Permanent atrial fibrillation     a. Chronic Coumadin.  [name] HTN (hypertension)   . Chronic diastolic CHF (congestive heart failure), NYHA class 1     a. 04/2013 Echo: EF 50-55%, triv MR, mildly dil LA, PASP , mild TR, mild-mod RA dil.  [name] CAD (coronary artery disease)     a. 2005 Cath: 100% dLCX-->Med managed.  . Sick sinus syndrome     a. 05/2008 s/p MDT EnRhythm DC PPM ([facility]).  . Chronic pain     a. ? due to arthritis  . Glaucoma   . Hyperlipidemia   . Diverticulitis large intestine   . Allergy   . Phlebitis     a. after pacemeker placement in 2010.  [name] Colon polyps   . Hypothyroidism   . Transfusion history     a. After childbirth 60 years ago (1957)  . Rash/skin eruption     a. Multiple episodes - wide distribution-intermittent, possibly drug rash.  . Pulmonary HTN     a. PASP on echo 04/2013.  [name] Chronic Right Pleural Effusion     a.  07/2012 s/p thoracentesis - Followed by Dr. Delford Field [facility] Pulmonology.  . Arthritis   . Bronchitis   . Hay fever   . Colon polyps   . History of blood transfusion   . Urine incontinence   . Back complaints     Medications:  Scheduled:  . antiseptic oral rinse  7 mL Mouth Rinse q12n4p  . atorvastatin  40 mg Oral q1800  . calcium gluconate  1 g Intravenous Once  . chlorhexidine  15 mL Mouth Rinse BID  . diltiazem  180 mg Oral Daily  . dorzolamide-timolol  1 drop Both Eyes BID  . [START ON 10/20/2014] furosemide  40 mg Oral Daily  . latanoprost  1 drop Both Eyes QHS  . [START ON 10/20/2014] levothyroxine  112 mcg Oral QAC breakfast  . magnesium oxide  400 mg Oral BID  . meropenem (MERREM) IV  1 g Intravenous Q12H  . metronidazole  500 mg Intravenous Q6H  . [START ON 10/20/2014] potassium chloride  10 mEq Oral Daily  . potassium chloride  40 mEq Oral Once  . warfarin  5 mg Oral q1800    Assessment: 81 yo female who underwent percutaneous chloecystostomy drain placement on 9/22.  Home dose of warfarin held for surgery.  Per surgery, ok to restart warfarin tonight. Pharmacy consulted to dose.    INR Dose 9/22 1.28 Restart home dose warfarin  Goal of Therapy:  INR 2-3 Monitor platelets by anticoagulation protocol: Yes   Plan:  Will restart pt on home dose of warfarin  daily/2.5mg  Saturday.  Of note, pt on Flagyl so we may see an increase in INR.  Monitor INR daily, adjust as needed.  Jacqualyn Posey, PharmD Clinical Pharmacist 10/19/2014,4:05 PM

## 2014-10-19 NOTE — Care Management Important Message (Signed)
Important Message  Patient Details  Name: [name] MRN: [id number] Date of Birth: [birth year]   Medicare Important Message Given:  Yes-second notification given    Laurie Larsen 10/19/2014, 10:05 AM

## 2014-10-19 NOTE — Care Management (Signed)
Transferred to floor from ICU, case followed by CSW.  Discussed with attending Dr Hilton Sinclair. Anticipate discharge to assisted living or higher level if need be. From [facility]. Being treated conservatively because of advanced age. Continue to follow

## 2014-10-19 NOTE — Progress Notes (Signed)
Report called to Shanda Bumps RN on 2C. Patient to move to room 212. Family at bedside and updated.

## 2014-10-20 LAB — PROTIME-INR
INR: 1.47
Prothrombin Time: 18 seconds — ABNORMAL HIGH (ref 11.4–15.0)

## 2014-10-20 LAB — BASIC METABOLIC PANEL
ANION GAP: 7 (ref 5–15)
BUN: 13 mg/dL (ref 6–20)
CALCIUM: 7.8 mg/dL — AB (ref 8.9–10.3)
CHLORIDE: 104 mmol/L (ref 101–111)
CO2: 22 mmol/L (ref 22–32)
Creatinine, Ser: 0.55 mg/dL (ref 0.44–1.00)
GFR calc Af Amer: >60 mL/min (ref >60)
GFR calc non Af Amer: >60 mL/min (ref >60)
GLUCOSE: 186 mg/dL — AB (ref 65–99)
Potassium: 3.2 mmol/L — ABNORMAL LOW (ref 3.5–5.1)
Sodium: 133 mmol/L — ABNORMAL LOW (ref 135–145)

## 2014-10-20 LAB — PHOSPHORUS: PHOSPHORUS: 2.2 mg/dL — AB (ref 2.5–4.6)

## 2014-10-20 LAB — MAGNESIUM: MAGNESIUM: 1.8 mg/dL (ref 1.7–2.4)

## 2014-10-20 MED ORDER — POTASSIUM PHOSPHATES 15 MMOLE/5ML IV SOLN: 15.0000 mmol | Freq: Once | INTRAVENOUS | Status: DC

## 2014-10-20 MED ORDER — POTASSIUM PHOSPHATES 15 MMOLE/5ML IV SOLN
30.0000 mmol | Freq: Once | INTRAVENOUS | Status: AC
  Administered 2014-10-20: 30 mmol via INTRAVENOUS
  Filled 2014-10-20: qty 10

## 2014-10-20 MED ORDER — POLYETHYLENE GLYCOL 3350 17 G PO PACK: 17.0000 g | PACK | Freq: Every day | ORAL | Status: DC

## 2014-10-20 MED ORDER — WARFARIN SODIUM 5 MG PO TABS
2.5000 mg | ORAL_TABLET | ORAL | Status: DC
  Administered 2014-10-21: 2.5 mg via ORAL
  Filled 2014-10-20: qty 1

## 2014-10-20 MED ORDER — SODIUM CHLORIDE 0.9 % IV SOLN
1.0000 g | Freq: Once | INTRAVENOUS | Status: AC
  Administered 2014-10-20: 1 g via INTRAVENOUS
  Filled 2014-10-20: qty 10

## 2014-10-20 MED ORDER — BOOST / RESOURCE BREEZE PO LIQD
1.0000 | Freq: Three times a day (TID) | ORAL | Status: DC
  Administered 2014-10-20 – 2014-10-24 (×6): 1 via ORAL

## 2014-10-20 MED ORDER — POLYETHYLENE GLYCOL 3350 17 G PO PACK
17.0000 g | PACK | Freq: Every day | ORAL | Status: DC
  Administered 2014-10-20 – 2014-10-23 (×5): 17 g via ORAL
  Filled 2014-10-20 (×5): qty 1

## 2014-10-20 NOTE — Evaluation (Signed)
Physical Therapy Evaluation Patient Details Name: Laurie Larsen MRN: [id number] DOB: [birth year] Today's Date: 10/20/2014   History of Present Illness  Patient is a pleasant 81 y/o female from the [facility] that presents with acute abdominal pain, treated for cholecystitis.   Clinical Impression  Patient initially states she is feeling nauseated but agrees to work with PT to ambulate to [facility]. She requires cga x 1 for bed mobility, though increased time noted. She ambulates with flexed torso and decreased stride length/gait speed indicating she is at a higher risk of falling. After roughly 30' of ambulation she asks to return to her room as she becomes fatigued quickly. No loss of balance, but urgency to void in this session, combined with markedly decreased gait speed are concerning for falls risk. Per patient she has been fairly independent with RW for short distance mobility recently and denies any falls. Patient would benefit from short term rehabilitation to increase her strength and stamina.     Follow Up Recommendations SNF    Equipment Recommendations       Recommendations for Other Services       Precautions / Restrictions Precautions Precautions: Fall Restrictions Weight Bearing Restrictions: No      Mobility  Bed Mobility Overal bed mobility: Needs Assistance Bed Mobility: Supine to Sit     Supine to sit: Min guard     General bed mobility comments: Patient requires increased time and minimal cuing for bed mobility, she is able to control her torso for elevation and bring her LEs off the side of the bed with no assistance.   Transfers Overall transfer level: Needs assistance Equipment used: Rolling walker (2 wheeled) Transfers: Sit to/from Stand Sit to Stand: Min assist         General transfer comment: Patient able to transfer from bed and bedside commode with little to no assistance.   Ambulation/Gait Ambulation/Gait assistance: Min  guard Ambulation Distance (Feet): 60 Feet Assistive device: Rolling walker (2 wheeled) Gait Pattern/deviations: Step-through pattern;Decreased step length - right;Decreased step length - left   Gait velocity interpretation: <1.8 ft/sec, indicative of risk for recurrent falls General Gait Details: Patient with very slow gait with RW, thoracic and cervical flexion noted and inability to scan environment without cuing.   Stairs            Wheelchair Mobility    Modified Rankin (Stroke Patients Only)       Balance Overall balance assessment: Needs assistance Sitting-balance support: Feet unsupported Sitting balance-Leahy Scale: Good Sitting balance - Comments: Patient is able to sit independently at the edge of the bed with no external assistance.    Standing balance support: Bilateral upper extremity supported Standing balance-Leahy Scale: Fair Standing balance comment: Patient does not have any overt loss of balance, but her step length is certainly decreased and gait speed markedly down indicating she is at high risk for a fall.                              Pertinent Vitals/Pain Pain Assessment:  (Patient states she has some abdominal pain, denies commenting on severity)    Home Living Family/patient expects to be discharged to:: Private residence Living Arrangements: Spouse/significant other Available Help at Discharge: Family Type of Home: Independent living facility Home Access: Level entry       Home Equipment: Walker - 2 wheels      Prior Function Level of Independence: Independent  with assistive device(s)         Comments: Patient uses a RW at baseline for short distance mobility.      Hand Dominance        Extremity/Trunk Assessment   Upper Extremity Assessment:  (Generally weak, unable to fully tease out due to urgency to void)           Lower Extremity Assessment:  (Generally weak, deferred testing secondary to urgency to  void.)         Communication   Communication: No difficulties  Cognition Arousal/Alertness: Awake/alert Behavior During Therapy: WFL for tasks assessed/performed Overall Cognitive Status: Within Functional Limits for tasks assessed                      General Comments      Exercises        Assessment/Plan    PT Assessment Patient needs continued PT services  PT Diagnosis Difficulty walking;Generalized weakness   PT Problem List Decreased strength;Decreased activity tolerance;Decreased balance;Cardiopulmonary status limiting activity;Decreased mobility;Decreased safety awareness  PT Treatment Interventions DME instruction;Therapeutic exercise;Therapeutic activities;Gait training;Neuromuscular re-education;Balance training   PT Goals (Current goals can be found in the Care Plan section) Acute Rehab PT Goals Patient Stated Goal: To return home when able  PT Goal Formulation: With patient/family Time For Goal Achievement: 11/03/14 Potential to Achieve Goals: Good    Frequency Min 2X/week   Barriers to discharge Decreased caregiver support Patient's husband is not able to provide significant physical assistance.     Co-evaluation               End of Session Equipment Utilized During Treatment: Gait belt;Oxygen Activity Tolerance: Patient limited by fatigue;Patient tolerated treatment well Patient left: in chair;with call bell/phone within reach;with family/visitor present;with chair alarm set Nurse Communication: Mobility status         Time: 1610-9604 PT Time Calculation (min) (ACUTE ONLY): 41 min   Charges:   PT Evaluation $Initial PT Evaluation Tier I: 1 Procedure PT Treatments $Therapeutic Activity: 23-37 mins (Patient began voiding on floor, time spent on [facility] transfer and cleanup)   PT G Codes:       Kerin Ransom, PT, DPT    10/20/2014, 3:32 PM

## 2014-10-20 NOTE — Progress Notes (Signed)
CC: Abdominal pain Subjective: 81 year old female admitted for acute cholecystitis. Has been treated with IV antibiotics and percutaneous cholecystostomy drain. Patient's primary complaint is of abdominal soreness. She also states she does not like the food here and has had some subjective nausea. She is much more alert this morning then previous mornings. States she has had some flatus in the last 24 hours.  Objective: Vital signs in last 24 hours: Temp:  [97.5 F (36.4 C)-98.6 F (37 C)] 98.6 F (37 C) (09/23 1018) Pulse Rate:  [82-100] 85 (09/23 1018) Resp:  [14-22] 18 (09/23 1018) BP: (116-157)/(67-87) 131/67 mmHg (09/23 1018) SpO2:  [89 %-98 %] 96 % (09/23 1018)    Intake/Output from previous day: 09/22 0701 - 09/23 0700 In: 1609.1 [P.O.:815; IV Piggyback:794.1] Out: 1040 [Urine:775; Drains:265] Intake/Output this shift:    Physical exam:  Gen.: No acute distress Lungs: Clear to auscultation Heart: Irregularly irregular Abdomen: Soft, tender to palpation in the right upper quadrant around percutaneous cholecystostomy drain, nondistended, bowel sounds present  Lab Results: CBC   Recent Labs  10/18/14 0355 10/19/14 0513  WBC 13.3* 9.3  HGB 11.5* 11.4*  HCT 33.6* 33.5*  PLT 145* 146*   BMET  Recent Labs  10/19/14 0513 10/20/14 0527  NA 138 133*  K 3.2* 3.2*  CL 106 104  CO2 24 22  GLUCOSE 87 186*  BUN 19 13  CREATININE 0.66 0.55  CALCIUM 8.2* 7.8*   PT/INR  Recent Labs  10/19/14 1454 10/20/14 0527  LABPROT 16.2* 18.0*  INR 1.28 1.47   ABG No results for input(s): PHART, HCO3 in the last 72 hours.  Invalid input(s): PCO2, PO2  Studies/Results: Ct Image Guided Drainage By Percutaneous Catheter  10/18/2014   CLINICAL DATA:  81 year old with multiple medical problems including acute cholecystitis. Patient is not a candidate for cholecystectomy at this time.  EXAM: CT GUIDED CHOLECYSTOSTOMY TUBE PLACEMENT  Physician: Rachelle Hora. Lowella Dandy, MD  FLUOROSCOPY  TIME:  None  MEDICATIONS: 1 mg Versed, 75 mcg fentanyl. A radiology nurse monitored the patient for moderate sedation.  ANESTHESIA/SEDATION: Moderate sedation time: 40 minutes  PROCEDURE: The procedure was explained to the patient. The risks and benefits of the procedure were discussed and the patient's questions were addressed. Informed consent was obtained from the patient. Patient was placed supine on the CT scanner. Images through the abdomen were obtained.  The right anterior abdomen was prepped and draped in a sterile fashion. Maximal barrier sterile technique was utilized including mask, sterile gowns, sterile gloves, sterile drape, hand hygiene and skin antiseptic. The right upper abdomen was anesthetized with 1% lidocaine. An 18 gauge needle was directed into the gallbladder through the inferior aspect of the medial left hepatic lobe. Stiff Amplatz wire was advanced into the gallbladder and placement confirmed with follow-up CT images. 40 mL of dark bile was aspirated. Catheter was sutured to skin and attached to gravity bag. Fluid sample sent for culture. Follow up CT images were obtained. Bandage was placed over the drain site.  FINDINGS: The gallbladder is distended with pericholecystic fluid and a small amount of perihepatic fluid. Findings raise concern for gallbladder rupture or perforation. Needle and wire were directed through the inferior aspect of the medial left hepatic lobe and into the gallbladder. Wire and catheter placement confirmed in the gallbladder. The gallbladder was decompressed at the end of the procedure.  Estimated blood loss: Minimal  COMPLICATIONS: None  IMPRESSION: Successful CT-guided cholecystostomy tube placement.  Bile sample sent for culture.  Fluid  around the gallbladder and liver raise concern for gallbladder rupture or perforation.   Electronically Signed   By: Richarda Overlie M.D.   On: 10/18/2014 14:29    Anti-infectives: Anti-infectives    Start     Dose/Rate Route  Frequency Ordered Stop   10/18/14 1000  meropenem (MERREM) 1 g in sodium chloride 0.9 % 100 mL IVPB     1 g 200 mL/hr over 30 Minutes Intravenous Every 12 hours 10/18/14 0832     10/17/14 2145  metroNIDAZOLE (FLAGYL) IVPB 500 mg     500 mg 100 mL/hr over 60 Minutes Intravenous Every 6 hours 10/17/14 2134     10/17/14 1830  meropenem (MERREM) 500 mg in sodium chloride 0.9 % 50 mL IVPB  Status:  Discontinued     500 mg 100 mL/hr over 30 Minutes Intravenous Every 12 hours 10/17/14 1812 10/18/14 1610      Assessment/Plan:  81 year old female with acute cholecystitis treated with percutaneous cholecystostomy drain and antibiotics. No plans for surgical intervention at this time. Appreciate pharmacy assistance with electrolytes and antibiotics. Appreciate medicine assistance with patient's multiple chronic medications. We will start physical therapy today for strengthening and mobility. Continue supportive care  Charles T. Tonita Cong, MD, FACS  10/20/2014

## 2014-10-20 NOTE — Progress Notes (Signed)
Pharmacy Consult for Electrolyte Management  Allergies  Allergen Reactions  . Amoxicillin Other (See Comments)    Reaction:  Weakness  . Digoxin Other (See Comments)    Reaction:  Weakness and dehydration  . Risedronate Sodium Rash    Patient Measurements: Height:  (160 cm) Weight: 102 lb 4.7 oz (46.4 kg) IBW/kg (Calculated) : 52.4  Vital Signs: Temp: 97.5 F (36.4 C) (09/23 0511) Temp Source: Oral (09/23 0511) BP: 123/80 mmHg (09/23 0511) Pulse Rate: 82 (09/23 0511) Intake/Output from previous day: 09/22 0701 - 09/23 0700 In: 1609.1 [P.O.:815; IV Piggyback:794.1] Out: 1040 [Urine:775; Drains:265] Intake/Output from this shift: Total I/O In: 760 [P.O.:475; IV Piggyback:285] Out: 575 [Urine:400; Drains:175]  Labs:  Recent Labs  10/17/14 1515  10/17/14 1643 10/18/14 0355 10/19/14 0513 10/19/14 1454 10/20/14 0527  WBC 19.7*  --   --  13.3* 9.3  --   --   HGB 13.6  --   --  11.5* 11.4*  --   --   HCT 40.4  --   --  33.6* 33.5*  --   --   PLT 196  --   --  145* 146*  --   --   APTT  --   --  49* 43*  --   --   --   INR  --   < > 3.05 1.51  --  1.28 1.47  < > = values in this interval not displayed.   Recent Labs  10/17/14 1515  10/18/14 0355 10/18/14 1458 10/19/14 0513 10/20/14 0527  NA 132*  --  137  --  138 133*  K 3.4*  --  2.9* 3.4* 3.2* 3.2*  CL 94*  --  101  --  106 104  CO2 27  --  26  --  24 22  GLUCOSE 137*  --  99  --  87 186*  BUN 45*  --  35*  --  19 13  CREATININE 1.47*  --  1.05*  --  0.66 0.55  CALCIUM 8.9  --  8.1*  --  8.2* 7.8*  MG  --   < > 1.7  --  1.8 1.8  PHOS  --   < > 2.7  --  2.3* 2.2*  PROT 7.2  --  6.2*  --   --   --   ALBUMIN 3.8  --  3.3*  --   --   --   AST 54*  --  29  --   --   --   ALT 28  --  27  --   --   --   ALKPHOS 132*  --  124  --   --   --   BILITOT 1.3*  --  1.6*  --   --   --   < > = values in this interval not displayed. Estimated Creatinine Clearance: 34.9 mL/min (by C-G formula based on Cr of  0.55).   No results for input([place]): GLUCAP in the last 72 hours.  Medical History: Past Medical History  Diagnosis Date  . Permanent atrial fibrillation     a. Chronic Coumadin.  [name] HTN (hypertension)   . Chronic diastolic CHF (congestive heart failure), NYHA class 1     a. 04/2013 Echo: EF 50-55%, triv MR, mildly dil LA, PASP , mild TR, mild-mod RA dil.  [name] CAD (coronary artery disease)     a. 2005 Cath: 100% dLCX-->Med managed.  . Sick sinus syndrome  a. 05/2008 Larsen/p MDT EnRhythm DC PPM ([facility]).  . Chronic pain     a. ? due to arthritis  . Glaucoma   . Hyperlipidemia   . Diverticulitis large intestine   . Allergy   . Phlebitis     a. after pacemeker placement in 2010.  [name] Colon polyps   . Hypothyroidism   . Transfusion history     a. After childbirth 60 years ago (1957)  . Rash/skin eruption     a. Multiple episodes - wide distribution-intermittent, possibly drug rash.  . Pulmonary HTN     a. PASP on echo 04/2013.  [name] Chronic Right Pleural Effusion     a. 07/2012 Larsen/p thoracentesis - Followed by Dr. Delford Field [facility] Pulmonology.  . Arthritis   . Bronchitis   . Hay fever   . Colon polyps   . History of blood transfusion   . Urine incontinence   . Back complaints     Medications:  Scheduled:  . antiseptic oral rinse  7 mL Mouth Rinse q12n4p  . atorvastatin  40 mg Oral q1800  . calcium gluconate  1 g Intravenous Once  . chlorhexidine  15 mL Mouth Rinse BID  . diltiazem  180 mg Oral Daily  . dorzolamide-timolol  1 drop Both Eyes BID  . furosemide  40 mg Oral Daily  . latanoprost  1 drop Both Eyes QHS  . levothyroxine  112 mcg Oral QAC breakfast  . magnesium oxide  400 mg Oral BID  . meropenem (MERREM) IV  1 g Intravenous Q12H  . metronidazole  500 mg Intravenous Q6H  . polyethylene glycol  17 g Oral QHS  . potassium chloride  10 mEq Oral Daily  . potassium chloride  40 mEq Oral Once  . potassium phosphate IVPB (mmol)  15 mmol Intravenous Once  .  warfarin  5 mg Oral Once per day on Sun Mon Tue Wed Thu Fri  . Warfarin - Pharmacist Dosing Inpatient   Does not apply q1800   Infusions:   PRN: hydrALAZINE, HYDROcodone-acetaminophen, morphine injection, ondansetron **OR** ondansetron (ZOFRAN) IV  Assessment: Pharmacy consulted to assist in managing electrolytes in this 81 y/o F with acute cholecystitis.   Plan:  Corrected calcium is 8.7 after 1 gram of calcium gluconate yesterday so will repeat 1 gram today. Potassium and phosphorus are both slightly low so will replace with KPhos 40 meq iv once. Will f/u AM labs.   9/23 Potassium and phosphate still low. Corrected calcium 8.4. Potassium phosphate 30 mmol and calcium gluconate 1 gram x1 ordered. Recheck labs in AM.  Laurie Larsen 10/20/2014,6:08 AM

## 2014-10-20 NOTE — Progress Notes (Signed)
ANTICOAGULATION CONSULT NOTE - Initial Consult  Pharmacy Consult for Warfarin Indication: atrial fibrillation  Allergies  Allergen Reactions  . Amoxicillin Other (See Comments)    Reaction:  Weakness  . Digoxin Other (See Comments)    Reaction:  Weakness and dehydration  . Risedronate Sodium Rash    Patient Measurements: Height:  (160 cm) Weight: 102 lb 4.7 oz (46.4 kg) IBW/kg (Calculated) : 52.4   Vital Signs: Temp: 97.5 F (36.4 C) (09/23 0511) Temp Source: Oral (09/23 0511) BP: 123/80 mmHg (09/23 0511) Pulse Rate: 82 (09/23 0511)  Labs:  Recent Labs  10/17/14 1515  10/17/14 1643 10/18/14 0355 10/19/14 0513 10/19/14 1454 10/20/14 0527  HGB 13.6  --   --  11.5* 11.4*  --   --   HCT 40.4  --   --  33.6* 33.5*  --   --   PLT 196  --   --  145* 146*  --   --   APTT  --   --  49* 43*  --   --   --   LABPROT  --   < > 31.6* 18.4*  --  16.2* 18.0*  INR  --   < > 3.05 1.51  --  1.28 1.47  CREATININE 1.47*  --   --  1.05* 0.66  --  0.55  < > = values in this interval not displayed.  Estimated Creatinine Clearance: 34.9 mL/min (by C-G formula based on Cr of 0.55).   Medical History: Past Medical History  Diagnosis Date  . Permanent atrial fibrillation     a. Chronic Coumadin.  [name] HTN (hypertension)   . Chronic diastolic CHF (congestive heart failure), NYHA class 1     a. 04/2013 Echo: EF 50-55%, triv MR, mildly dil LA, PASP , mild TR, mild-mod RA dil.  [name] CAD (coronary artery disease)     a. 2005 Cath: 100% dLCX-->Med managed.  . Sick sinus syndrome     a. 05/2008 s/p MDT EnRhythm DC PPM ([facility]).  . Chronic pain     a. ? due to arthritis  . Glaucoma   . Hyperlipidemia   . Diverticulitis large intestine   . Allergy   . Phlebitis     a. after pacemeker placement in 2010.  [name] Colon polyps   . Hypothyroidism   . Transfusion history     a. After childbirth 60 years ago (1957)  . Rash/skin eruption     a. Multiple episodes - wide  distribution-intermittent, possibly drug rash.  . Pulmonary HTN     a. PASP on echo 04/2013.  [name] Chronic Right Pleural Effusion     a. 07/2012 s/p thoracentesis - Followed by Dr. Delford Field [facility] Pulmonology.  . Arthritis   . Bronchitis   . Hay fever   . Colon polyps   . History of blood transfusion   . Urine incontinence   . Back complaints     Medications:  Scheduled:  . antiseptic oral rinse  7 mL Mouth Rinse q12n4p  . atorvastatin  40 mg Oral q1800  . chlorhexidine  15 mL Mouth Rinse BID  . diltiazem  180 mg Oral Daily  . dorzolamide-timolol  1 drop Both Eyes BID  . furosemide  40 mg Oral Daily  . latanoprost  1 drop Both Eyes QHS  . levothyroxine  112 mcg Oral QAC breakfast  . magnesium oxide  400 mg Oral BID  . meropenem (MERREM) IV  1 g Intravenous Q12H  .  metronidazole  500 mg Intravenous Q6H  . polyethylene glycol  17 g Oral QHS  . potassium chloride  10 mEq Oral Daily  . potassium chloride  40 mEq Oral Once  . warfarin  5 mg Oral Once per day on Sun Mon Tue Wed Thu Fri  . Warfarin - Pharmacist Dosing Inpatient   Does not apply q1800    Assessment: 81 yo female who underwent percutaneous chloecystostomy drain placement on 9/22.  Home dose of warfarin held for surgery.  Per surgery, ok to restart warfarin tonight. Pharmacy consulted to dose.    INR Dose 9/22 1.28 Restart home dose warfarin  Goal of Therapy:  INR 2-3 Monitor platelets by anticoagulation protocol: Yes   Plan:  Will restart pt on home dose of warfarin  daily/2.5mg  Saturday.  Of note, pt on Flagyl so we may see an increase in INR.  Monitor INR daily, adjust as needed.  9/23 INR 1.47 Continue current regimen. INR in AM.  Erich Montane, PharmD Clinical Pharmacist 10/20/2014,6:04 AM

## 2014-10-20 NOTE — Progress Notes (Signed)
Dr. Clint Guy notified of pt's distress regarding not having BM since Monday and her susceptibility to impaction and requests Miralax restarted as at home. New order written; Windy Carina, RN; 1:18 AM;10/20/2014

## 2014-10-20 NOTE — Progress Notes (Signed)
ANTIBIOTIC CONSULT NOTE - INITIAL  Pharmacy Consult for Meropenem Indication: cholecystitis  Allergies  Allergen Reactions  . Amoxicillin Other (See Comments)    Reaction:  Weakness  . Digoxin Other (See Comments)    Reaction:  Weakness and dehydration  . Risedronate Sodium Rash    Patient Measurements: Height:  (160 cm) Weight: 102 lb 4.7 oz (46.4 kg) IBW/kg (Calculated) : 52.4  Vital Signs: Temp: 98.6 F (37 C) (09/23 1018) Temp Source: Oral (09/23 1018) BP: 124/70 mmHg (09/23 1327) Pulse Rate: 81 (09/23 1327)  Labs:  Recent Labs  10/17/14 1515 10/18/14 0355 10/19/14 0513 10/20/14 0527  WBC 19.7* 13.3* 9.3  --   HGB 13.6 11.5* 11.4*  --   PLT 196 145* 146*  --   CREATININE 1.47* 1.05* 0.66 0.55   Estimated Creatinine Clearance: 34.9 mL/min (by C-G formula based on Cr of 0.55). No results for input(s): VANCOTROUGH, VANCOPEAK, VANCORANDOM, GENTTROUGH, GENTPEAK, GENTRANDOM, TOBRATROUGH, TOBRAPEAK, TOBRARND, AMIKACINPEAK, AMIKACINTROU, AMIKACIN in the last 72 hours.   Microbiology: Recent Results (from the past 720 hour(s))  MRSA PCR Screening     Status: None   Collection Time: 10/17/14  9:17 PM  Result Value Ref Range Status   MRSA by PCR NEGATIVE NEGATIVE Final    Comment:        The GeneXpert MRSA Assay (FDA approved for NASAL specimens only), is one component of a comprehensive MRSA colonization surveillance program. It is not intended to diagnose MRSA infection nor to guide or monitor treatment for MRSA infections.   Body fluid culture     Status: None (Preliminary result)   Collection Time: 10/18/14  1:15 PM  Result Value Ref Range Status   Specimen Description FLUID  Final   Special Requests NONE  Final   Gram Stain MANY WBC SEEN FEW GRAM NEGATIVE COCCOBACILLI   Final   Culture MODERATE ESCHERICHIA COLI  Final   Report Status PENDING  Incomplete   Organism ID, Bacteria ESCHERICHIA COLI  Final      Susceptibility   Escherichia coli -  MIC*    AMPICILLIN 4 SENSITIVE Sensitive     CEFAZOLIN <=4 SENSITIVE Sensitive     CEFTRIAXONE <=1 SENSITIVE Sensitive     CIPROFLOXACIN <=0.25 SENSITIVE Sensitive     GENTAMICIN <=1 SENSITIVE Sensitive     IMIPENEM <=0.25 SENSITIVE Sensitive     NITROFURANTOIN <=16 SENSITIVE Sensitive     TRIMETH/SULFA <=20 SENSITIVE Sensitive     * MODERATE ESCHERICHIA COLI   Assessment: 81 yo female admitted with acute cholecystitis.  Pharmacy consulted for meropenem dosing. Today is day 4 of meropenem, patient also on IV metronidazole. Abd fluid culture with pan sensitive e. coli  Plan:  Will continue current orders for meropenem 1gm IV Q12H.  Pharmacy to follow per consult  Garlon Hatchet, PharmD Clinical Pharmacist  10/20/2014 1:48 PM

## 2014-10-20 NOTE — Consult Note (Signed)
[facility] [place] [facility]   PATIENT NAME: Laurie Larsen    MR#:  [id number]  DATE OF BIRTH:  Sep 23, 1923  SUBJECTIVE:  CHIEF COMPLAINT:   Chief Complaint  Patient presents with  . Abdominal Pain   Continued abdominal pain, is more controlled this morning. Had to change from PCA to when necessary pain medication as she has glaucoma and cannot see the PCA pump. No further nausea  REVIEW OF SYSTEMS:   Review of Systems  Constitutional: Negative for fever.  Respiratory: Negative for shortness of breath.   Cardiovascular: Negative for chest pain and palpitations.  Gastrointestinal: Positive for nausea, vomiting and abdominal pain.  Genitourinary: Negative for dysuria.    DRUG ALLERGIES:   Allergies  Allergen Reactions  . Amoxicillin Other (See Comments)    Reaction:  Weakness  . Digoxin Other (See Comments)    Reaction:  Weakness and dehydration  . Risedronate Sodium Rash    VITALS:  Blood pressure 124/70, pulse 81, temperature 98.6 F (37 C), temperature source Oral, resp. rate 18, height  (1.6 m), weight 46.4 kg (102 lb 4.7 oz), SpO2 98 %.  PHYSICAL EXAMINATION:  GENERAL:  81 y.o.-year-old patient lying in the bed, ill-appearing EYES: Pupils equal, round, reactive to light and accommodation. No scleral icterus. Extraocular muscles intact.  HEENT: Head atraumatic, normocephalic. Oropharynx and nasopharynx clear. Mucous membranes are dry NECK:  Supple, no jugular venous distention. No thyroid enlargement, no tenderness.  LUNGS: Normal breath sounds bilaterally, no wheezing, rales,rhonchi or crepitation. No use of accessory muscles of respiration. Short respirations CARDIOVASCULAR: S1, S2 normal. No murmurs, rubs, or gallops.  ABDOMEN: Distended, tender, tympanic, drain in place over right upper quadrant EXTREMITIES: No pedal edema, cyanosis, or clubbing.  NEUROLOGIC: Cranial nerves II through XII are intact. Muscle strength 5/5 in  all extremities. Sensation intact. Gait not checked.  PSYCHIATRIC: The patient is alert and oriented x 3.  SKIN: No obvious rash, lesion, or ulcer.    LABORATORY PANEL:   CBC  Recent Labs Lab 10/19/14 0513  WBC 9.3  HGB 11.4*  HCT 33.5*  PLT 146*   ------------------------------------------------------------------------------------------------------------------  Chemistries   Recent Labs Lab 10/18/14 0355  10/20/14 0527  NA 137  < > 133*  K 2.9*  < > 3.2*  CL 101  < > 104  CO2 26  < > 22  GLUCOSE 99  < > 186*  BUN 35*  < > 13  CREATININE 1.05*  < > 0.55  CALCIUM 8.1*  < > 7.8*  MG 1.7  < > 1.8  AST 29  --   --   ALT 27  --   --   ALKPHOS 124  --   --   BILITOT 1.6*  --   --   < > = values in this interval not displayed. ------------------------------------------------------------------------------------------------------------------  Cardiac Enzymes No results for input(s): TROPONINI in the last 168 hours. ------------------------------------------------------------------------------------------------------------------  RADIOLOGY:  Ct Image Guided Drainage By Percutaneous Catheter  10/18/2014   CLINICAL DATA:  81 year old with multiple medical problems including acute cholecystitis. Patient is not a candidate for cholecystectomy at this time.  EXAM: CT GUIDED CHOLECYSTOSTOMY TUBE PLACEMENT  Physician: Rachelle Hora. Lowella Dandy, MD  FLUOROSCOPY TIME:  None  MEDICATIONS: 1 mg Versed, 75 mcg fentanyl. A radiology nurse monitored the patient for moderate sedation.  ANESTHESIA/SEDATION: Moderate sedation time: 40 minutes  PROCEDURE: The procedure was explained to the patient. The risks and benefits of the procedure were discussed  and the patient's questions were addressed. Informed consent was obtained from the patient. Patient was placed supine on the CT scanner. Images through the abdomen were obtained.  The right anterior abdomen was prepped and draped in a sterile fashion. Maximal  barrier sterile technique was utilized including mask, sterile gowns, sterile gloves, sterile drape, hand hygiene and skin antiseptic. The right upper abdomen was anesthetized with 1% lidocaine. An 18 gauge needle was directed into the gallbladder through the inferior aspect of the medial left hepatic lobe. Stiff Amplatz wire was advanced into the gallbladder and placement confirmed with follow-up CT images. 40 mL of dark bile was aspirated. Catheter was sutured to skin and attached to gravity bag. Fluid sample sent for culture. Follow up CT images were obtained. Bandage was placed over the drain site.  FINDINGS: The gallbladder is distended with pericholecystic fluid and a small amount of perihepatic fluid. Findings raise concern for gallbladder rupture or perforation. Needle and wire were directed through the inferior aspect of the medial left hepatic lobe and into the gallbladder. Wire and catheter placement confirmed in the gallbladder. The gallbladder was decompressed at the end of the procedure.  Estimated blood loss: Minimal  COMPLICATIONS: None  IMPRESSION: Successful CT-guided cholecystostomy tube placement.  Bile sample sent for culture.  Fluid around the gallbladder and liver raise concern for gallbladder rupture or perforation.   Electronically Signed   By: Richarda Overlie M.D.   On: 10/18/2014 14:29    EKG:   Orders placed or performed during the hospital encounter of 05/31/14  . ED EKG  . ED EKG  . EKG 12-Lead  . EKG 12-Lead  . EKG 12-Lead  . EKG 12-Lead  . EKG    ASSESSMENT AND PLAN:    1. Atrial fibrillation. - Coumadin restarted with pharmacy assist - Rate is currently controlled, continue Cardizem. Holding atenolol for now  #2 hypokalemia/hypomagnesemia: - Replace, continue to monitor  #3 hyperlipidemia - Statin restarted  #4 hypothyroidism - Continue levothyroxine  #5 history of diastolic congestive heart failure - No exacerbation, continue with furosemide  #6  nocturnal hypoxia/sleep apnea - Continue nighttime oxygen supplementation  #7 glaucoma/macular degeneration - Patient with very poor vision, stable  #8 acute cholecystitis - Management per primary team, surgery - Biliary fluid growing pansensitive Escherichia coli, could consider de-escalating antibiotics coverage - Continue pain management and percutaneous drainage - Abdomen quite distended, concern for ileus, but she is passing gas  CODE STATUS: Full  TOTAL TIME TAKING CARE OF THIS PATIENT: 25 minutes.  Greater than 50% of time spent in care coordination and counseling. POSSIBLE D/C IN 2-3 DAYS, DEPENDING ON CLINICAL CONDITION.   Elby Showers M.D on 10/20/2014 at 1:31 PM  Between 7am to 6pm - Pager - [phone]  After 6pm go to [email or web address] - password [email or web address] [facility]  [facility]  Office  [phone]  CC: Primary care physician; Wynona Dove, MD

## 2014-10-21 LAB — PHOSPHORUS: PHOSPHORUS: 2.6 mg/dL (ref 2.5–4.6)

## 2014-10-21 LAB — BASIC METABOLIC PANEL
Anion gap: 5 (ref 5–15)
BUN: 10 mg/dL (ref 6–20)
CHLORIDE: 104 mmol/L (ref 101–111)
CO2: 27 mmol/L (ref 22–32)
Calcium: 7.7 mg/dL — ABNORMAL LOW (ref 8.9–10.3)
Creatinine, Ser: 0.51 mg/dL (ref 0.44–1.00)
Glucose, Bld: 102 mg/dL — ABNORMAL HIGH (ref 65–99)
POTASSIUM: 3.2 mmol/L — AB (ref 3.5–5.1)
SODIUM: 136 mmol/L (ref 135–145)

## 2014-10-21 LAB — PROTIME-INR
INR: 1.7
Prothrombin Time: 20.2 seconds — ABNORMAL HIGH (ref 11.4–15.0)

## 2014-10-21 LAB — MAGNESIUM: MAGNESIUM: 1.7 mg/dL (ref 1.7–2.4)

## 2014-10-21 MED ORDER — NITROFURANTOIN MONOHYD MACRO 100 MG PO CAPS
100.0000 mg | ORAL_CAPSULE | Freq: Two times a day (BID) | ORAL | Status: DC
  Administered 2014-10-21 – 2014-10-22 (×3): 100 mg via ORAL
  Filled 2014-10-21 (×3): qty 1

## 2014-10-21 MED ORDER — CALCIUM CARBONATE 1250 (500 CA) MG PO TABS
1.0000 | ORAL_TABLET | Freq: Two times a day (BID) | ORAL | Status: AC
  Administered 2014-10-21: 500 mg via ORAL
  Filled 2014-10-21 (×2): qty 1

## 2014-10-21 MED ORDER — CALCIUM CARBONATE ANTACID 500 MG PO CHEW
2.5000 | CHEWABLE_TABLET | Freq: Two times a day (BID) | ORAL | Status: AC
  Administered 2014-10-21 (×2): 500 mg via ORAL

## 2014-10-21 MED ORDER — POTASSIUM CHLORIDE CRYS ER 20 MEQ PO TBCR
20.0000 meq | EXTENDED_RELEASE_TABLET | ORAL | Status: AC
  Administered 2014-10-21 (×3): 20 meq via ORAL
  Filled 2014-10-21 (×3): qty 1

## 2014-10-21 NOTE — Progress Notes (Signed)
CC: Cholecystitis Subjective: 81 year old female admitted for acute cholecystitis that was assured with a percutaneous cholecystostomy tube and IV antibiotics. Patient states that she is feeling better than at time of admission still. She has tolerated some oral intake however desires something more substantial than the clear liquid she has been receiving. She denies any fevers or chills. Continues to have abdominal discomfort.  Objective: Vital signs in last 24 hours: Temp:  [97.5 F (36.4 C)-98.6 F (37 C)] 97.6 F (36.4 C) (09/24 0644) Pulse Rate:  [73-85] 73 (09/24 0644) Resp:  [16-18] 17 (09/24 0644) BP: (122-155)/(67-83) 155/83 mmHg (09/24 0644) SpO2:  [93 %-98 %] 93 % (09/24 0644)    Intake/Output from previous day: 09/23 0701 - 09/24 0700 In: 1194 [P.O.:600; IV Piggyback:594] Out: 1900 [Urine:1600; Drains:300] Intake/Output this shift: Total I/O In: -  Out: 500 [Urine:500]  Physical exam:  Gen.: No acute distress Chest: Clear to auscultation Heart: Irregularly irregular Abdomen: Soft, appropriately tender to palpation around the percutaneous cholecystostomy drain, nondistended with active bowel sounds.  Lab Results: CBC   Recent Labs  10/19/14 0513  WBC 9.3  HGB 11.4*  HCT 33.5*  PLT 146*   BMET  Recent Labs  10/20/14 0527 10/21/14 0529  NA 133* 136  K 3.2* 3.2*  CL 104 104  CO2 22 27  GLUCOSE 186* 102*  BUN 13 10  CREATININE 0.55 0.51  CALCIUM 7.8* 7.7*   PT/INR  Recent Labs  10/20/14 0527 10/21/14 0529  LABPROT 18.0* 20.2*  INR 1.47 1.70   ABG No results for input(s): PHART, HCO3 in the last 72 hours.  Invalid input(s): PCO2, PO2  Studies/Results: Fluid culture growing pansensitive Escherichia coli.  Anti-infectives: Anti-infectives    Start     Dose/Rate Route Frequency Ordered Stop   10/18/14 1000  meropenem (MERREM) 1 g in sodium chloride 0.9 % 100 mL IVPB  Status:  Discontinued     1 g 200 mL/hr over 30 Minutes Intravenous  Every 12 hours 10/18/14 0832 10/21/14 0855   10/17/14 2145  metroNIDAZOLE (FLAGYL) IVPB 500 mg  Status:  Discontinued     500 mg 100 mL/hr over 60 Minutes Intravenous Every 6 hours 10/17/14 2134 10/21/14 0855   10/17/14 1830  meropenem (MERREM) 500 mg in sodium chloride 0.9 % 50 mL IVPB  Status:  Discontinued     500 mg 100 mL/hr over 30 Minutes Intravenous Every 12 hours 10/17/14 1812 10/18/14 1610      Assessment/Plan:  81 year old female admitted for acute cholecystitis treated with cholecystostomy. We will transition from IV antibiotics to oral antibiotics based on culture today. We will recheck CBC tomorrow to ensure continued efficacy on oral antibiotics Appreciate pharmacy assistance with electrolyte replacement Appreciate medical assistance with restarting patient's chronic medications. Patient continues to have severe deconditioning will likely need rehabilitation upon discharge  Charles T. Tonita Cong, MD, FACS  10/21/2014

## 2014-10-21 NOTE — Progress Notes (Signed)
ANTICOAGULATION CONSULT NOTE - Initial Consult  Pharmacy Consult for Warfarin Indication: atrial fibrillation  Allergies  Allergen Reactions  . Amoxicillin Other (See Comments)    Reaction:  Weakness  . Digoxin Other (See Comments)    Reaction:  Weakness and dehydration  . Risedronate Sodium Rash    Patient Measurements: Height:  (160 cm) Weight: 102 lb 4.7 oz (46.4 kg) IBW/kg (Calculated) : 52.4   Vital Signs: Temp: 97.5 F (36.4 C) (09/23 2151) Temp Source: Oral (09/23 2151) BP: 122/67 mmHg (09/23 2151) Pulse Rate: 73 (09/23 2151)  Labs:  Recent Labs  10/19/14 0513 10/19/14 1454 10/20/14 0527 10/21/14 0529  HGB 11.4*  --   --   --   HCT 33.5*  --   --   --   PLT 146*  --   --   --   LABPROT  --  16.2* 18.0* 20.2*  INR  --  1.28 1.47 1.70  CREATININE 0.66  --  0.55  --     Estimated Creatinine Clearance: 34.9 mL/min (by C-G formula based on Cr of 0.55).   Medical History: Past Medical History  Diagnosis Date  . Permanent atrial fibrillation     a. Chronic Coumadin.  [name] HTN (hypertension)   . Chronic diastolic CHF (congestive heart failure), NYHA class 1     a. 04/2013 Echo: EF 50-55%, triv MR, mildly dil LA, PASP , mild TR, mild-mod RA dil.  [name] CAD (coronary artery disease)     a. 2005 Cath: 100% dLCX-->Med managed.  . Sick sinus syndrome     a. 05/2008 s/p MDT EnRhythm DC PPM (Allred).  . Chronic pain     a. ? due to arthritis  . Glaucoma   . Hyperlipidemia   . Diverticulitis large intestine   . Allergy   . Phlebitis     a. after pacemeker placement in 2010.  [name] Colon polyps   . Hypothyroidism   . Transfusion history     a. After childbirth 60 years ago (1957)  . Rash/skin eruption     a. Multiple episodes - wide distribution-intermittent, possibly drug rash.  . Pulmonary HTN     a. PASP on echo 04/2013.  [name] Chronic Right Pleural Effusion     a. 07/2012 s/p thoracentesis - Followed by Dr. Delford Field [facility] Pulmonology.  .  Arthritis   . Bronchitis   . Hay fever   . Colon polyps   . History of blood transfusion   . Urine incontinence   . Back complaints     Medications:  Scheduled:  . antiseptic oral rinse  7 mL Mouth Rinse q12n4p  . atorvastatin  40 mg Oral q1800  . chlorhexidine  15 mL Mouth Rinse BID  . diltiazem  180 mg Oral Daily  . dorzolamide-timolol  1 drop Both Eyes BID  . feeding supplement  1 Container Oral TID WC  . furosemide  40 mg Oral Daily  . latanoprost  1 drop Both Eyes QHS  . levothyroxine  112 mcg Oral QAC breakfast  . magnesium oxide  400 mg Oral BID  . meropenem (MERREM) IV  1 g Intravenous Q12H  . metronidazole  500 mg Intravenous Q6H  . polyethylene glycol  17 g Oral QHS  . potassium chloride  10 mEq Oral Daily  . potassium chloride  40 mEq Oral Once  . warfarin  2.5 mg Oral Once per day on Sat  . warfarin  5 mg Oral Once per  day on Sun Mon Tue Wed Thu Fri  . Warfarin - Pharmacist Dosing Inpatient   Does not apply q1800    Assessment: 81 yo female who underwent percutaneous chloecystostomy drain placement on 9/22.  Home dose of warfarin held for surgery.  Per surgery, ok to restart warfarin tonight. Pharmacy consulted to dose.    INR Dose 9/22 1.28 Restart home dose warfarin  Goal of Therapy:  INR 2-3 Monitor platelets by anticoagulation protocol: Yes   Plan:  Will restart pt on home dose of warfarin  daily/2.5mg  Saturday.  Of note, pt on Flagyl so we may see an increase in INR.  Monitor INR daily, adjust as needed.  9/23 INR 1.47 Continue current regimen. INR in AM.  9/24 AM INR 1.7. Continue current regimen. Recheck INR in AM.  Erich Montane, PharmD Clinical Pharmacist 10/21/2014,6:39 AM

## 2014-10-21 NOTE — Consult Note (Signed)
[facility] - [place] at [facility]   PATIENT NAME: Laurie Larsen    MR#:  [id number]  DATE OF BIRTH:  1923-12-22  SUBJECTIVE:  CHIEF COMPLAINT:   Chief Complaint  Patient presents with  . Abdominal Pain   Patient is resting comfortably. Tolerating clear liquid diet. Pain is manageable with the current pain medication regimen.Son is at bedside  REVIEW OF SYSTEMS:   Review of Systems  Constitutional: Negative for fever.  Respiratory: Negative for shortness of breath.   Cardiovascular: Negative for chest pain and palpitations.  Gastrointestinal: Positive for nausea. Negative for vomiting and abdominal pain.  Genitourinary: Negative for dysuria.  Musculoskeletal: Negative for back pain and neck pain.  Neurological: Negative for dizziness, tingling and tremors.  Psychiatric/Behavioral: Negative for hallucinations. The patient is not nervous/anxious.     DRUG ALLERGIES:   Allergies  Allergen Reactions  . Amoxicillin Other (See Comments)    Reaction:  Weakness  . Digoxin Other (See Comments)    Reaction:  Weakness and dehydration  . Risedronate Sodium Rash    VITALS:  Blood pressure 103/68, pulse 75, temperature 97.6 F (36.4 C), temperature source Oral, resp. rate 19, height  (1.6 m), weight 46.4 kg (102 lb 4.7 oz), SpO2 97 %.  PHYSICAL EXAMINATION:  GENERAL:  81 y.o.-year-old patient lying in the bed, ill-appearing EYES: Pupils equal, round, reactive to light and accommodation. No scleral icterus. Extraocular muscles intact.  HEENT: Head atraumatic, normocephalic. Oropharynx and nasopharynx clear. Mucous membranes are dry NECK:  Supple, no jugular venous distention. No thyroid enlargement, no tenderness.  LUNGS: Normal breath sounds bilaterally, no wheezing, rales,rhonchi or crepitation. No use of accessory muscles of respiration. Short respirations CARDIOVASCULAR: S1, S2 normal. No murmurs, rubs, or gallops.  ABDOMEN: Distended, tender,  tympanic, drain in place over right upper quadrant with greenish brown liquid EXTREMITIES: No pedal edema, cyanosis, or clubbing.  NEUROLOGIC: Cranial nerves II through XII are intact. Muscle strength 5/5 in all extremities. Sensation intact. Gait not checked.  PSYCHIATRIC: The patient is alert and oriented x 3.  SKIN: No obvious rash, lesion, or ulcer.    LABORATORY PANEL:   CBC  Recent Labs Lab 10/19/14 0513  WBC 9.3  HGB 11.4*  HCT 33.5*  PLT 146*   ------------------------------------------------------------------------------------------------------------------  Chemistries   Recent Labs Lab 10/18/14 0355  10/21/14 0529  NA 137  < > 136  K 2.9*  < > 3.2*  CL 101  < > 104  CO2 26  < > 27  GLUCOSE 99  < > 102*  BUN 35*  < > 10  CREATININE 1.05*  < > 0.51  CALCIUM 8.1*  < > 7.7*  MG 1.7  < > 1.7  AST 29  --   --   ALT 27  --   --   ALKPHOS 124  --   --   BILITOT 1.6*  --   --   < > = values in this interval not displayed. ------------------------------------------------------------------------------------------------------------------  Cardiac Enzymes No results for input(s): TROPONINI in the last 168 hours. ------------------------------------------------------------------------------------------------------------------  RADIOLOGY:  No results found.  EKG:   Orders placed or performed during the hospital encounter of 05/31/14  . ED EKG  . ED EKG  . EKG 12-Lead  . EKG 12-Lead  . EKG 12-Lead  . EKG 12-Lead  . EKG    ASSESSMENT AND PLAN:    1. Atrial fibrillation. - Coumadin restarted with pharmacy assist, INR is at 1.7 today - Rate  is currently controlled, continue Cardizem. Holding atenolol for now  #2 hypokalemia/hypomagnesemia: - Replace, continue to monitor  #3 hyperlipidemia - Statin restarted  #4 hypothyroidism - Continue levothyroxine  #5 history of diastolic congestive heart failure - No exacerbation, continue with  furosemide  #6 nocturnal hypoxia/sleep apnea - Continue nighttime oxygen supplementation  #7 glaucoma/macular degeneration - Patient with very poor vision, stable  #8 acute cholecystitis - Management per primary team, surgery - Biliary fluid growing pansensitive Escherichia coli, could consider de-escalating antibiotics coverage - Continue pain management and percutaneous drainage   plan of care was discussed with the patient and her son at bedside     CODE STATUS: Full  TOTAL TIME TAKING CARE OF THIS PATIENT: 25 minutes.  Greater than 50% of time spent in care coordination and counseling. POSSIBLE D/C IN 2-3 DAYS, DEPENDING ON CLINICAL CONDITION.   Ramonita Lab M.D on 10/21/2014 at 2:42 PM  Between 7am to 6pm - Pager - [phone]  After 6pm go to [email or web address] - password [email or web address] [facility]  [facility]  Office  [phone]  CC: Primary care physician; Wynona Dove, MD

## 2014-10-21 NOTE — Progress Notes (Signed)
Pharmacy Consult for Electrolyte Management  Allergies  Allergen Reactions  . Amoxicillin Other (See Comments)    Reaction:  Weakness  . Digoxin Other (See Comments)    Reaction:  Weakness and dehydration  . Risedronate Sodium Rash    Patient Measurements: Height:  (160 cm) Weight: 102 lb 4.7 oz (46.4 kg) IBW/kg (Calculated) : 52.4  Vital Signs: Temp: 97.6 F (36.4 C) (09/24 0644) Temp Source: Oral (09/24 0644) BP: 155/83 mmHg (09/24 0644) Pulse Rate: 73 (09/24 0644) Intake/Output from previous day: 09/23 0701 - 09/24 0700 In: 1194 [P.O.:600; IV Piggyback:594] Out: 1900 [Urine:1600; Drains:300] Intake/Output from this shift: Total I/O In: 0  Out: 850 [Urine:800; Drains:50]  Labs:  Recent Labs  10/19/14 0513 10/19/14 1454 10/20/14 0527 10/21/14 0529  WBC 9.3  --   --   --   HGB 11.4*  --   --   --   HCT 33.5*  --   --   --   PLT 146*  --   --   --   INR  --  1.28 1.47 1.70     Recent Labs  10/19/14 0513 10/20/14 0527 10/21/14 0529  NA 138 133* 136  K 3.2* 3.2* 3.2*  CL 106 104 104  CO2 GLUCOSE 87 186* 102*  BUN CREATININE 0.66 0.55 0.51  CALCIUM 8.2* 7.8* 7.7*  MG 1.8 1.8 1.7  PHOS 2.3* 2.2* 2.6   Estimated Creatinine Clearance: 34.9 mL/min (by C-G formula based on Cr of 0.51).   No results for input(Larsen): GLUCAP in the last 72 hours.  Medical History: Past Medical History  Diagnosis Date  . Permanent atrial fibrillation     a. Chronic Coumadin.  [name] HTN (hypertension)   . Chronic diastolic CHF (congestive heart failure), NYHA class 1     a. 04/2013 Echo: EF 50-55%, triv MR, mildly dil LA, PASP , mild TR, mild-mod RA dil.  [name] CAD (coronary artery disease)     a. 2005 Cath: 100% dLCX-->Med managed.  . Sick sinus syndrome     a. 05/2008 Larsen/p MDT EnRhythm DC PPM ([facility]red).  . Chronic pain     a. ? due to arthritis  . Glaucoma   . Hyperlipidemia   . Diverticulitis large intestine   . Allergy   . Phlebitis     a.  after pacemeker placement in 2010.  [name] Colon polyps   . Hypothyroidism   . Transfusion history     a. After childbirth 60 years ago (1957)  . Rash/skin eruption     a. Multiple episodes - wide distribution-intermittent, possibly drug rash.  . Pulmonary HTN     a. PASP on echo 04/2013.  [name] Chronic Right Pleural Effusion     a. 07/2012 Larsen/p thoracentesis - Followed by Dr. Delford Field [facility] Pulmonology.  . Arthritis   . Bronchitis   . Hay fever   . Colon polyps   . History of blood transfusion   . Urine incontinence   . Back complaints     Medications:  Scheduled:  . antiseptic oral rinse  7 mL Mouth Rinse q12n4p  . atorvastatin  40 mg Oral q1800  . calcium carbonate  1 tablet Oral BID WC  . chlorhexidine  15 mL Mouth Rinse BID  . diltiazem  180 mg Oral Daily  . dorzolamide-timolol  1 drop Both Eyes BID  . feeding supplement  1 Container Oral TID WC  . furosemide  40 mg Oral Daily  . latanoprost  1 drop Both Eyes QHS  . levothyroxine  112 mcg Oral QAC breakfast  . magnesium oxide  400 mg Oral BID  . meropenem (MERREM) IV  1 g Intravenous Q12H  . metronidazole  500 mg Intravenous Q6H  . polyethylene glycol  17 g Oral QHS  . potassium chloride  10 mEq Oral Daily  . potassium chloride  20 mEq Oral 6 times per day  . potassium chloride  40 mEq Oral Once  . warfarin  2.5 mg Oral Once per day on Sat  . warfarin  5 mg Oral Once per day on Sun Mon Tue Wed Thu Fri  . Warfarin - Pharmacist Dosing Inpatient   Does not apply q1800   Infusions:   PRN: hydrALAZINE, HYDROcodone-acetaminophen, morphine injection, ondansetron **OR** ondansetron (ZOFRAN) IV  Assessment: Pharmacy consulted to assist in managing electrolytes in this 81 y/o F with acute cholecystitis.   Plan:  Corrected calcium is 8.7 after 1 gram of calcium gluconate yesterday so will repeat 1 gram today. Potassium and phosphorus are both slightly low so will replace with KPhos 40 meq iv once. Will f/u AM labs.   9/23  Potassium and phosphate still low. Corrected calcium 8.4. Potassium phosphate 30 mmol and calcium gluconate 1 gram x1 ordered. Recheck labs in AM.  9/24 AM K+ 3.2. Corrected calcium 8.3. KCl 20 mEq x3 doses and calcium carbonate 1250 mg x 2 doses ordered. BMP in AM.  Laurie Larsen 10/21/2014,6:49 AM

## 2014-10-22 LAB — CBC
HCT: 37.5 % (ref 35.0–47.0)
Hemoglobin: 12.8 g/dL (ref 12.0–16.0)
MCH: 31 pg (ref 26.0–34.0)
MCHC: 34.2 g/dL (ref 32.0–36.0)
MCV: 90.4 fL (ref 80.0–100.0)
PLATELETS: 190 10*3/uL (ref 150–440)
RBC: 4.14 MIL/uL (ref 3.80–5.20)
RDW: 13.9 % (ref 11.5–14.5)
WBC: 10.8 10*3/uL (ref 3.6–11.0)

## 2014-10-22 LAB — PROTIME-INR
INR: 2.2
Prothrombin Time: 24.6 seconds — ABNORMAL HIGH (ref 11.4–15.0)

## 2014-10-22 LAB — BASIC METABOLIC PANEL
Anion gap: 6 (ref 5–15)
BUN: 10 mg/dL (ref 6–20)
CHLORIDE: 103 mmol/L (ref 101–111)
CO2: 25 mmol/L (ref 22–32)
CREATININE: 0.52 mg/dL (ref 0.44–1.00)
Calcium: 8.2 mg/dL — ABNORMAL LOW (ref 8.9–10.3)
GFR calc Af Amer: >60 mL/min (ref >60)
GFR calc non Af Amer: >60 mL/min (ref >60)
GLUCOSE: 101 mg/dL — AB (ref 65–99)
Potassium: 4 mmol/L (ref 3.5–5.1)
SODIUM: 134 mmol/L — AB (ref 135–145)

## 2014-10-22 LAB — BODY FLUID CULTURE

## 2014-10-22 MED ORDER — ATENOLOL 25 MG PO TABS
25.0000 mg | ORAL_TABLET | Freq: Two times a day (BID) | ORAL | Status: DC
  Administered 2014-10-22 – 2014-10-24 (×3): 25 mg via ORAL
  Filled 2014-10-22 (×4): qty 1

## 2014-10-22 MED ORDER — FUROSEMIDE 40 MG PO TABS
60.0000 mg | ORAL_TABLET | Freq: Every day | ORAL | Status: DC
  Administered 2014-10-23 – 2014-10-24 (×2): 60 mg via ORAL
  Filled 2014-10-22 (×2): qty 1

## 2014-10-22 MED ORDER — SULFAMETHOXAZOLE-TRIMETHOPRIM 800-160 MG PO TABS
1.0000 | ORAL_TABLET | Freq: Two times a day (BID) | ORAL | Status: DC
Start: 2014-10-22 — End: 2014-10-24
  Administered 2014-10-22 – 2014-10-24 (×5): 1 via ORAL
  Filled 2014-10-22 (×5): qty 1

## 2014-10-22 NOTE — Discharge Summary (Signed)
Patient ID: Laurie Larsen MRN: [id number] DOB/AGE: 1923/03/20 81 y.o.  Admit date: 10/17/2014 Discharge date: 10/24/2014  Discharge Diagnoses:  Cholecystitis  Procedures Performed: Percutaneous Cholecystostomy Drain   Discharged Condition: good  Hospital Course: Admitted to ICU from ER with acute cholecystitis. Deemed too high risk for surgical intervention and taken for drain placement after reversal of coumadin. Tolerated the procedure. Was treated with IV antibiotics until culture results allowed for tailored antibiotics with oral antibiotics. Chronic home medications restarted and patient able to tolerate regular diet prior to discharge. Required physical therapy for deconditioning.  Discharge Orders:   Disposition: 01-Home or Self Care  Discharge Medications:  Current facility-administered medications:  .  antiseptic oral rinse (CPC / CETYLPYRIDINIUM CHLORIDE 0.05%) solution 7 mL, 7 mL, Mouth Rinse, q12n4p, Ricarda Frame, MD, 7 mL at 10/20/14 1600 .  atorvastatin (LIPITOR) tablet 40 mg, 40 mg, Oral, q1800, Alford Highland, MD, 40 mg at 10/22/14 1806 .  chlorhexidine (PERIDEX) 0.12 % solution 15 mL, 15 mL, Mouth Rinse, BID, Ricarda Frame, MD, 15 mL at 10/21/14 2154 .  diltiazem (CARDIZEM CD) 24 hr capsule 180 mg, 180 mg, Oral, Daily, Alford Highland, MD, 180 mg at 10/22/14 0943 .  dorzolamide-timolol (COSOPT) 22.3-6.8 MG/ML ophthalmic solution 1 drop, 1 drop, Both Eyes, BID, Erin Fulling, MD, 1 drop at 10/22/14 0945 .  feeding supplement (BOOST / RESOURCE BREEZE) liquid 1 Container, 1 Container, Oral, TID WC, Alford Highland, MD, 1 Container at 10/22/14 1807 .  furosemide (LASIX) tablet 40 mg, 40 mg, Oral, Daily, Alford Highland, MD, 40 mg at 10/22/14 0943 .  hydrALAZINE (APRESOLINE) injection 10 mg, 10 mg, Intravenous, Q2H PRN, Ricarda Frame, MD .  HYDROcodone-acetaminophen [facility]) 10-325 MG per tablet 1 tablet, 1 tablet, Oral, Q6H PRN, Alford Highland, MD, 1 tablet at  10/22/14 1806 .  latanoprost (XALATAN) 0.005 % ophthalmic solution 1 drop, 1 drop, Both Eyes, QHS, Erin Fulling, MD, 1 drop at 10/22/14 0031 .  levothyroxine (SYNTHROID, LEVOTHROID) tablet 112 mcg, 112 mcg, Oral, QAC breakfast, Alford Highland, MD, 112 mcg at 10/22/14 0944 .  magnesium oxide (MAG-OX) tablet 400 mg, 400 mg, Oral, BID, Alford Highland, MD, 400 mg at 10/22/14 0944 .  morphine 2 MG/ML injection 2 mg, 2 mg, Intravenous, Q2H PRN, Ricarda Frame, MD, 2 mg at 10/20/14 1810 .  ondansetron (ZOFRAN-ODT) disintegrating tablet 4 mg, 4 mg, Oral, Q6H PRN **OR** ondansetron (ZOFRAN) injection 4 mg, 4 mg, Intravenous, Q6H PRN, Ricarda Frame, MD, 4 mg at 10/17/14 2014 .  polyethylene glycol (MIRALAX / GLYCOLAX) packet 17 g, 17 g, Oral, QHS, Wyatt Haste, MD, 17 g at 10/21/14 2154 .  potassium chloride (K-DUR,KLOR-CON) CR tablet 10 mEq, 10 mEq, Oral, Daily, Alford Highland, MD, 10 mEq at 10/22/14 0944 .  potassium chloride SA (K-DUR,KLOR-CON) CR tablet 40 mEq, 40 mEq, Oral, Once, Alford Highland, MD, 40 mEq at 10/19/14 1548 .  sulfamethoxazole-trimethoprim (BACTRIM DS,SEPTRA DS) 800-160 MG per tablet 1 tablet, 1 tablet, Oral, Q12H, Ricarda Frame, MD, 1 tablet at 10/22/14 1546 .  warfarin (COUMADIN) tablet 2.5 mg, 2.5 mg, Oral, Once per day on Sat, Gale Journey, MD, 2.5 mg at 10/21/14 1757 .  warfarin (COUMADIN) tablet 5 mg, 5 mg, Oral, Once per day on Sun Mon Tue Wed Thu Fri, Alford Highland, MD, 5 mg at 10/22/14 1806 .  Warfarin - Pharmacist Dosing Inpatient, , Does not apply, q1800, Ricarda Frame, MD  Follwup:  Discharged by Dr. Excell Seltzer. F/U with me later this week. Signed: Ricarda Frame  10/22/2014, 6:33 PM

## 2014-10-22 NOTE — Progress Notes (Signed)
Pharmacy Consult for Electrolyte Management  Allergies  Allergen Reactions  . Amoxicillin Other (See Comments)    Reaction:  Weakness  . Digoxin Other (See Comments)    Reaction:  Weakness and dehydration  . Risedronate Sodium Rash    Patient Measurements: Height:  (160 cm) Weight: 102 lb 4.7 oz (46.4 kg) IBW/kg (Calculated) : 52.4  Vital Signs: Temp: 97.8 F (36.6 C) (09/25 0420) Temp Source: Oral (09/25 0420) BP: 154/79 mmHg (09/25 0420) Pulse Rate: 78 (09/25 0420) Intake/Output from previous day: 09/24 0701 - 09/25 0700 In: 1060 [P.O.:960] Out: 1800 [Urine:1600; Drains:200] Intake/Output from this shift:    Labs:  Recent Labs  10/20/14 0527 10/21/14 0529 10/22/14 0708  WBC  --   --  10.8  HGB  --   --  12.8  HCT  --   --  37.5  PLT  --   --  190  INR 1.47 1.70 2.20     Recent Labs  10/20/14 0527 10/21/14 0529 10/22/14 0708  NA 133* 136 134*  K 3.2* 3.2* 4.0  CL 104 104 103  CO2 GLUCOSE 186* 102* 101*  BUN CREATININE 0.55 0.51 0.52  CALCIUM 7.8* 7.7* 8.2*  MG 1.8 1.7  --   PHOS 2.2* 2.6  --    Estimated Creatinine Clearance: 34.9 mL/min (by C-G formula based on Cr of 0.52).   No results for input(s): GLUCAP in the last 72 hours.  Medical History: Past Medical History  Diagnosis Date  . Permanent atrial fibrillation     a. Chronic Coumadin.  [name] HTN (hypertension)   . Chronic diastolic CHF (congestive heart failure), NYHA class 1     a. 04/2013 Echo: EF 50-55%, triv MR, mildly dil LA, PASP , mild TR, mild-mod RA dil.  [name] CAD (coronary artery disease)     a. 2005 Cath: 100% dLCX-->Med managed.  . Sick sinus syndrome     a. 05/2008 s/p MDT EnRhythm DC PPM ([facility]red).  . Chronic pain     a. ? due to arthritis  . Glaucoma   . Hyperlipidemia   . Diverticulitis large intestine   . Allergy   . Phlebitis     a. after pacemeker placement in 2010.  [name] Colon polyps   . Hypothyroidism   . Transfusion history     a.  After childbirth 60 years ago (1957)  . Rash/skin eruption     a. Multiple episodes - wide distribution-intermittent, possibly drug rash.  . Pulmonary HTN     a. PASP on echo 04/2013.  [name] Chronic Right Pleural Effusion     a. 07/2012 s/p thoracentesis - Followed by Dr. Delford Field [facility] Pulmonology.  . Arthritis   . Bronchitis   . Hay fever   . Colon polyps   . History of blood transfusion   . Urine incontinence   . Back complaints     Medications:  Scheduled:  . antiseptic oral rinse  7 mL Mouth Rinse q12n4p  . atorvastatin  40 mg Oral q1800  . chlorhexidine  15 mL Mouth Rinse BID  . diltiazem  180 mg Oral Daily  . dorzolamide-timolol  1 drop Both Eyes BID  . feeding supplement  1 Container Oral TID WC  . furosemide  40 mg Oral Daily  . latanoprost  1 drop Both Eyes QHS  . levothyroxine  112 mcg Oral QAC breakfast  . magnesium oxide  400 mg Oral BID  .  nitrofurantoin (macrocrystal-monohydrate)  100 mg Oral Q12H  . polyethylene glycol  17 g Oral QHS  . potassium chloride  10 mEq Oral Daily  . potassium chloride  40 mEq Oral Once  . warfarin  2.5 mg Oral Once per day on Sat  . warfarin  5 mg Oral Once per day on Sun Mon Tue Wed Thu Fri  . Warfarin - Pharmacist Dosing Inpatient   Does not apply q1800   Infusions:   PRN: hydrALAZINE, HYDROcodone-acetaminophen, morphine injection, ondansetron **OR** ondansetron (ZOFRAN) IV  Assessment: Pharmacy consulted to assist in managing electrolytes in this 81 y/o F with acute cholecystitis.   Plan:  Corrected calcium is 8.7 after 1 gram of calcium gluconate yesterday so will repeat 1 gram today. Potassium and phosphorus are both slightly low so will replace with KPhos 40 meq iv once. Will f/u AM labs.   9/23 Potassium and phosphate still low. Corrected calcium 8.4. Potassium phosphate 30 mmol and calcium gluconate 1 gram x1 ordered. Recheck labs in AM.  9/24 AM K+ 3.2. Corrected calcium 8.3. KCl 20 mEq x3 doses and calcium  carbonate 1250 mg x 2 doses ordered. BMP in AM.   9/25 No supplementation warranted at this time.  Follow up electrolytes with AM labs.    Stormy Card, RPh Clinical Pharmacist 10/22/2014,10:04 AM

## 2014-10-22 NOTE — Consult Note (Signed)
[facility] Physicians - [place] at [facility]   PATIENT NAME: Laurie Larsen    MR#:  [id number]  DATE OF BIRTH:  [birth year]  SUBJECTIVE:  CHIEF COMPLAINT:   Chief Complaint  Patient presents with  . Abdominal Pain   Patient is tolerating liquid diet..Son is at bedside  REVIEW OF SYSTEMS:   Review of Systems  Constitutional: Negative for fever.  Respiratory: Negative for shortness of breath.   Cardiovascular: Negative for chest pain and palpitations.  Gastrointestinal: Negative for nausea, vomiting and abdominal pain.  Genitourinary: Negative for dysuria.  Musculoskeletal: Negative for back pain and neck pain.  Neurological: Negative for dizziness, tingling and tremors.  Psychiatric/Behavioral: Negative for hallucinations. The patient is not nervous/anxious.     DRUG ALLERGIES:   Allergies  Allergen Reactions  . Amoxicillin Other (See Comments)    Reaction:  Weakness  . Digoxin Other (See Comments)    Reaction:  Weakness and dehydration  . Risedronate Sodium Rash    VITALS:  Blood pressure 164/80, pulse 74, temperature 97.1 F (36.2 C), temperature source Oral, resp. rate 14, height  (1.6 m), weight 46.4 kg (102 lb 4.7 oz), SpO2 99 %.  PHYSICAL EXAMINATION:  GENERAL:  81 y.o.-year-old patient lying in the bed, ill-appearing EYES: Pupils equal, round, reactive to light and accommodation. No scleral icterus. Extraocular muscles intact.  HEENT: Head atraumatic, normocephalic. Oropharynx and nasopharynx clear. Mucous membranes are dry NECK:  Supple, no jugular venous distention. No thyroid enlargement, no tenderness.  LUNGS: Normal breath sounds bilaterally, no wheezing, rales,rhonchi or crepitation. No use of accessory muscles of respiration. Short respirations CARDIOVASCULAR: S1, S2 normal. No murmurs, rubs, or gallops.  ABDOMEN: Distended, tender, tympanic, per cutaneous drain in place over right upper quadrant with greenish brown  liquid EXTREMITIES: No pedal edema, cyanosis, or clubbing.  NEUROLOGIC: Cranial nerves II through XII are intact. Muscle strength 5/5 in all extremities. Sensation intact. Gait not checked.  PSYCHIATRIC: The patient is alert and oriented x 3.  SKIN: No obvious rash, lesion, or ulcer.    LABORATORY PANEL:   CBC  Recent Labs Lab 10/22/14 0708  WBC 10.8  HGB 12.8  HCT 37.5  PLT 190   ------------------------------------------------------------------------------------------------------------------  Chemistries   Recent Labs Lab 10/18/14 0355  10/21/14 0529 10/22/14 0708  NA 137  < > 136 134*  K 2.9*  < > 3.2* 4.0  CL 101  < > 104 103  CO2 26  < > 27 25  GLUCOSE 99  < > 102* 101*  BUN 35*  < > 10 10  CREATININE 1.05*  < > 0.51 0.52  CALCIUM 8.1*  < > 7.7* 8.2*  MG 1.7  < > 1.7  --   AST 29  --   --   --   ALT 27  --   --   --   ALKPHOS 124  --   --   --   BILITOT 1.6*  --   --   --   < > = values in this interval not displayed. ------------------------------------------------------------------------------------------------------------------  Cardiac Enzymes No results for input(s): TROPONINI in the last 168 hours. ------------------------------------------------------------------------------------------------------------------  RADIOLOGY:  No results found.  EKG:   Orders placed or performed during the hospital encounter of 05/31/14  . ED EKG  . ED EKG  . EKG 12-Lead  . EKG 12-Lead  . EKG 12-Lead  . EKG 12-Lead  . EKG    ASSESSMENT AND PLAN:    1. Atrial fibrillation. -  Coumadin restarted with pharmacy assist, INR is at 2.2 today - Rate is currently controlled, continue Cardizem and  Resumed atenolol  #2 hypokalemia/hypomagnesemia: - Replace, continue to monitor  #3 hyperlipidemia - Statin restarted  #4 hypothyroidism - Continue levothyroxine  #5 history of diastolic congestive heart failure - No exacerbation, continue with furosemide,  atenolol and statin  #6 nocturnal hypoxia/sleep apnea - Continue nighttime oxygen supplementation  #7 glaucoma/macular degeneration - Patient with very poor vision, stable Resume her eye drops   #8 acute cholecystitis - Management per primary team, surgery - Biliary fluid growing pansensitive Escherichia coli, d/ced meropenem and pt is started on bactrim - Continue pain management and percutaneous drainage   plan of care was discussed with the patient and her son at bedside  Will sign off , please contact us with any questions   CODE STATUS: Full  TOTAL TIME TAKING CARE OF THIS PATIENT: 25 minutes.  Greater than 50% of time spent in care coordination and counseling. POSSIBLE D/C IN 2-3 DAYS, DEPENDING ON CLINICAL CONDITION.   Ramonita Lab M.D on 10/22/2014 at 9:35 PM  Between 7am to 6pm - Pager - [phone]  After 6pm go to [email or web address] - password [email or web address] [facility]  [place] [facility]  Office  [phone]  CC: Primary care physician; Wynona Dove, MD

## 2014-10-22 NOTE — Clinical Social Work Placement (Signed)
   CLINICAL SOCIAL WORK PLACEMENT  NOTE  Date:  10/22/2014  Patient Details  Name: Laurie Larsen MRN: [id number] Date of Birth: [birth year]  Clinical Social Work is seeking post-discharge placement for this patient at the Skilled  Nursing Facility level of care (*CSW will initial, date and re-position this form in  chart as items are completed):  Yes   Patient/family provided with [facility] Clinical Social Work Department's list of facilities offering this level of care within the geographic area requested by the patient (or if unable, by the patient's family).  Yes   Patient/family informed of their freedom to choose among providers that offer the needed level of care, that participate in Medicare, Medicaid or managed care program needed by the patient, have an available bed and are willing to accept the patient.  Yes   Patient/family informed of [facility]'s ownership interest in [facility] and [facility], as well as of the fact that they are under no obligation to receive care at these facilities.  PASRR submitted to EDS on       PASRR number received on       Existing PASRR number confirmed on       FL2 transmitted to all facilities in geographic area requested by pt/family on 10/22/14     FL2 transmitted to all facilities within larger geographic area on       Patient informed that his/her managed care company has contracts with or will negotiate with certain facilities, including the following:            Patient/family informed of bed offers received.  Patient chooses bed at       Physician recommends and patient chooses bed at      Patient to be transferred to   on  .  Patient to be transferred to facility by       Patient family notified on   of transfer.  Name of family member notified:        PHYSICIAN Please sign FL2     Additional Comment:    _______________________________________________ [name], LCSW 10/22/2014, 3:47 PM

## 2014-10-22 NOTE — Clinical Social Work Note (Signed)
Clinical Social Work Assessment  Patient Details  Name: Laurie Larsen MRN: [id number] Date of Birth: 10/11/1923  Date of referral:  10/22/14               Reason for consult:  Facility Placement (pt is from the [facility] [place])                Permission sought to share information with:  Family Supports, Oceanographer granted to share information::  Yes, Verbal Permission Granted  Name::     Laurie Larsen [phone]   Agency::  [facility]  Relationship::     Contact Information:     Housing/Transportation Living arrangements for the past 2 months:  Single Family Home Source of Information:  Patient, Spouse Patient Interpreter Needed:  None Criminal Activity/Legal Involvement Pertinent to Current Situation/Hospitalization:  No - Comment as needed Significant Relationships:  Adult Children, Spouse Lives with:  Spouse, Adult Children Do you feel safe going back to the place where you live?  Yes Need for family participation in patient care:  Yes (Comment)  Care giving concerns:  Pt explained that she is concerned with the effectiveness of going to SNF.  She is not sure if she want to go at this time because she did not feel it benefited her the last time she had rehab   Social Worker assessment / plan:  CSW spoke to pt she was able to answer orientation question, but she did have some difficulty with identifying the year.  She stated that she had received rehab in the past, but she did not feel that she benifited from it.  She currently lives with her husband at the [facility] of [place]. Pt is willing to do bed search and she will decide later if she wants to go to a SNF.    Employment status:  Disabled (Comment on whether or not currently receiving Disability), Retired Health and safety inspector:  Medicare PT Recommendations:  Skilled Nursing Facility Information / Referral to community resources:  Skilled Nursing Facility  Patient/Family's Response to  care:  Pt was in agreement with SNF search and thanked CSW for speaking with her.  Patient/Family's Understanding of and Emotional Response to Diagnosis, Current Treatment, and Prognosis:  Pt verbalized her understanding of SNF placement and asked for time to thing about what she wanted to do.  But she [place] allowing CSW to do bed search.    Emotional Assessment Appearance:  Appears stated age Attitude/Demeanor/Rapport:   (pleasent ) Affect (typically observed):  Appropriate Orientation:  Oriented to Self, Oriented to Place, Oriented to  Time, Fluctuating Orientation (Suspected and/or reported Sundowners) Alcohol / Substance use:  Never Used Psych involvement (Current and /or in the community):  No (Comment)  Discharge Needs  Concerns to be addressed:  Care Coordination Readmission within the last 30 days:  No Current discharge risk:  Physical Impairment Barriers to Discharge:  No Barriers Identified   Laurie Passy, LCSW 10/22/2014, 3:18 PM

## 2014-10-22 NOTE — Progress Notes (Signed)
CC: S/P percutaneous cholecystostomy Subjective: Patient reports that she had a rough night and that it took a long time to receive her pain medication per nursing. However she states that with pain medication on board she is continuing to improve. She has been able to ambulate away from bed. She is tolerating her diet and has tolerated the transition to pills from IV antibiotics.  Objective: Vital signs in last 24 hours: Temp:  [97.1 F (36.2 C)-98.1 F (36.7 C)] 97.1 F (36.2 C) (09/25 1205) Pulse Rate:  [75-81] 76 (09/25 1205) Resp:  [16-19] 18 (09/25 1205) BP: (103-154)/(68-80) 143/76 mmHg (09/25 1205) SpO2:  [94 %-99 %] 99 % (09/25 1205)    Intake/Output from previous day: 09/24 0701 - 09/25 0700 In: 1060 [P.O.:960] Out: 1800 [Urine:1600; Drains:200] Intake/Output this shift:    Physical exam:  Gen.: No acute distress Chest: Clear to auscultation and rhythm Abdomen: Soft, tender to palpation in the right upper quadrant around her percutaneous cholecystostomy drain. Nondistended with active bowel sounds.  Lab Results: CBC   Recent Labs  10/22/14 0708  WBC 10.8  HGB 12.8  HCT 37.5  PLT 190   BMET  Recent Labs  10/21/14 0529 10/22/14 0708  NA 136 134*  K 3.2* 4.0  CL 104 103  CO2 27 25  GLUCOSE 102* 101*  BUN 10 10  CREATININE 0.51 0.52  CALCIUM 7.7* 8.2*   PT/INR  Recent Labs  10/21/14 0529 10/22/14 0708  LABPROT 20.2* 24.6*  INR 1.70 2.20   ABG No results for input(s): PHART, HCO3 in the last 72 hours.  Invalid input(s): PCO2, PO2  Studies/Results: No results found.  Anti-infectives: Anti-infectives    Start     Dose/Rate Route Frequency Ordered Stop   10/18/14 1000  meropenem (MERREM) 1 g in sodium chloride 0.9 % 100 mL IVPB  Status:  Discontinued     1 g 200 mL/hr over 30 Minutes Intravenous Every 12 hours 10/18/14 0832 10/21/14 0855   10/17/14 2145  metroNIDAZOLE (FLAGYL) IVPB 500 mg  Status:  Discontinued     500 mg 100 mL/hr  over 60 Minutes Intravenous Every 6 hours 10/17/14 2134 10/21/14 0855   10/17/14 1830  meropenem (MERREM) 500 mg in sodium chloride 0.9 % 50 mL IVPB  Status:  Discontinued     500 mg 100 mL/hr over 30 Minutes Intravenous Every 12 hours 10/17/14 1812 10/18/14 4098      Assessment/Plan:  81 year old female treated acute cholecystitis with percutaneous cholecystostomy and antibiotics. Antibiotics changed from IV Merrem and Flagyl to oral Macrobid. We'll continue on antibiotics for a total of 2 weeks. Appreciate medicine's assistance with restarting chronic medications. Appreciate pharmacy assistance with electrolytes antibiotics. INR today therapeutic on Coumadin. We'll likely stop Lovenox tomorrow. Continue physical therapy and encourage ambulation. Patient will likely require stay in a rehabilitation facility. Will work for placement starting tomorrow.  Charles T. Tonita Cong, MD, FACS  10/22/2014

## 2014-10-22 NOTE — Progress Notes (Signed)
ANTICOAGULATION CONSULT NOTE - Follow-up  Pharmacy Consult for Warfarin Indication: atrial fibrillation  Allergies  Allergen Reactions  . Amoxicillin Other (See Comments)    Reaction:  Weakness  . Digoxin Other (See Comments)    Reaction:  Weakness and dehydration  . Risedronate Sodium Rash    Patient Measurements: Height:  (160 cm) Weight: 102 lb 4.7 oz (46.4 kg) IBW/kg (Calculated) : 52.4   Vital Signs: Temp: 97.8 F (36.6 C) (09/25 0420) Temp Source: Oral (09/25 0420) BP: 154/79 mmHg (09/25 0420) Pulse Rate: 78 (09/25 0420)  Labs:  Recent Labs  10/20/14 0527 10/21/14 0529 10/22/14 0708  HGB  --   --  12.8  HCT  --   --  37.5  PLT  --   --  190  LABPROT 18.0* 20.2* 24.6*  INR 1.47 1.70 2.20  CREATININE 0.55 0.51 0.52    Estimated Creatinine Clearance: 34.9 mL/min (by C-G formula based on Cr of 0.52).   Medical History: Past Medical History  Diagnosis Date  . Permanent atrial fibrillation     a. Chronic Coumadin.  [name] HTN (hypertension)   . Chronic diastolic CHF (congestive heart failure), NYHA class 1     a. 04/2013 Echo: EF 50-55%, triv MR, mildly dil LA, PASP , mild TR, mild-mod RA dil.  [name] CAD (coronary artery disease)     a. 2005 Cath: 100% dLCX-->Med managed.  . Sick sinus syndrome     a. 05/2008 s/p MDT EnRhythm DC PPM ([facility]).  . Chronic pain     a. ? due to arthritis  . Glaucoma   . Hyperlipidemia   . Diverticulitis large intestine   . Allergy   . Phlebitis     a. after pacemeker placement in 2010.  [name] Colon polyps   . Hypothyroidism   . Transfusion history     a. After childbirth 60 years ago (1957)  . Rash/skin eruption     a. Multiple episodes - wide distribution-intermittent, possibly drug rash.  . Pulmonary HTN     a. PASP on echo 04/2013.  [name] Chronic Right Pleural Effusion     a. 07/2012 s/p thoracentesis - Followed by Dr. Delford Field [facility] Pulmonology.  . Arthritis   . Bronchitis   . Hay fever   . Colon polyps    . History of blood transfusion   . Urine incontinence   . Back complaints     Medications:  Scheduled:  . antiseptic oral rinse  7 mL Mouth Rinse q12n4p  . atorvastatin  40 mg Oral q1800  . chlorhexidine  15 mL Mouth Rinse BID  . diltiazem  180 mg Oral Daily  . dorzolamide-timolol  1 drop Both Eyes BID  . feeding supplement  1 Container Oral TID WC  . furosemide  40 mg Oral Daily  . latanoprost  1 drop Both Eyes QHS  . levothyroxine  112 mcg Oral QAC breakfast  . magnesium oxide  400 mg Oral BID  . nitrofurantoin (macrocrystal-monohydrate)  100 mg Oral Q12H  . polyethylene glycol  17 g Oral QHS  . potassium chloride  10 mEq Oral Daily  . potassium chloride  40 mEq Oral Once  . warfarin  2.5 mg Oral Once per day on Sat  . warfarin  5 mg Oral Once per day on Sun Mon Tue Wed Thu Fri  . Warfarin - Pharmacist Dosing Inpatient   Does not apply q1800    Assessment: 81 yo female who underwent percutaneous chloecystostomy  drain placement on 9/22.  Home dose of warfarin held for surgery.  Per surgery, ok to restart warfarin tonight. Pharmacy consulted to dose.    INR Dose 9/22 1.28 Restart home dose warfarin  Goal of Therapy:  INR 2-3 Monitor platelets by anticoagulation protocol: Yes   Plan:  Will restart pt on home dose of warfarin  daily/2.5mg  Saturday.  Of note, pt on Flagyl so we may see an increase in INR.  Monitor INR daily, adjust as needed.  9/23 INR 1.47 Continue current regimen. INR in AM.  9/24 INR 1.7. Continue current regimen. Recheck INR in AM.  9/25 INR 2.2. Continue current dosing.  Recheck INR in AM.   Stormy Card, PharmD Clinical Pharmacist 10/22/2014,10:02 AM

## 2014-10-23 DIAGNOSIS — K819 Cholecystitis, unspecified: Secondary | ICD-10-CM

## 2014-10-23 DIAGNOSIS — Z7901 Long term (current) use of anticoagulants: Secondary | ICD-10-CM

## 2014-10-23 DIAGNOSIS — K810 Acute cholecystitis: Principal | ICD-10-CM

## 2014-10-23 LAB — PHOSPHORUS: Phosphorus: 3.1 mg/dL (ref 2.5–4.6)

## 2014-10-23 LAB — CBC
HEMATOCRIT: 37.5 % (ref 35.0–47.0)
HEMOGLOBIN: 12.5 g/dL (ref 12.0–16.0)
MCH: 30.1 pg (ref 26.0–34.0)
MCHC: 33.4 g/dL (ref 32.0–36.0)
MCV: 90 fL (ref 80.0–100.0)
Platelets: 201 10*3/uL (ref 150–440)
RBC: 4.16 MIL/uL (ref 3.80–5.20)
RDW: 14.1 % (ref 11.5–14.5)
WBC: 11.6 10*3/uL — AB (ref 3.6–11.0)

## 2014-10-23 LAB — PROTIME-INR
INR: 3.29
Prothrombin Time: 33.5 seconds — ABNORMAL HIGH (ref 11.4–15.0)

## 2014-10-23 LAB — BASIC METABOLIC PANEL
Anion gap: 6 (ref 5–15)
BUN: 11 mg/dL (ref 6–20)
CHLORIDE: 101 mmol/L (ref 101–111)
CO2: 28 mmol/L (ref 22–32)
Calcium: 8.4 mg/dL — ABNORMAL LOW (ref 8.9–10.3)
Creatinine, Ser: 0.74 mg/dL (ref 0.44–1.00)
GFR calc Af Amer: >60 mL/min (ref >60)
GFR calc non Af Amer: >60 mL/min (ref >60)
GLUCOSE: 101 mg/dL — AB (ref 65–99)
POTASSIUM: 4.2 mmol/L (ref 3.5–5.1)
Sodium: 135 mmol/L (ref 135–145)

## 2014-10-23 LAB — MAGNESIUM: Magnesium: 1.9 mg/dL (ref 1.7–2.4)

## 2014-10-23 MED ORDER — HYDROCODONE-ACETAMINOPHEN 10-325 MG PO TABS
1.0000 | ORAL_TABLET | ORAL | Status: DC | PRN
  Administered 2014-10-23 – 2014-10-24 (×2): 1 via ORAL
  Filled 2014-10-23 (×2): qty 1

## 2014-10-23 NOTE — Progress Notes (Signed)
Physical Therapy Treatment Patient Details Name: Laurie Larsen MRN: [id number] DOB: 1923-08-08 Today's Date: 10/23/2014    History of Present Illness Patient is a pleasant 81 y/o female from [place] that presents with acute abdominal pain, treated for cholecystitis.     PT Comments    Pt complains of increased malaise today; more weak. Unable to increase O2 saturation above 87-88% on 2L O2 today with/without activity. Pt able to participate in exercises, but only able to perform short ambulation from chair to bed due to weakness and decreased O2 saturation. Levels discussed with nursing. Continue PT to progress strength, endurance, safe O2 levels with activity, ambulation and safety to improve functional mobility.   Follow Up Recommendations  SNF     Equipment Recommendations       Recommendations for Other Services       Precautions / Restrictions Restrictions Weight Bearing Restrictions: No    Mobility  Bed Mobility Overal bed mobility: Modified Independent Bed Mobility: Sit to Supine       Sit to supine: Modified independent (Device/Increase time)   General bed mobility comments:  (Increased time required)  Transfers Overall transfer level: Needs assistance Equipment used: Rolling walker (2 wheeled) Transfers: Sit to/from Stand Sit to Stand: Min assist         General transfer comment: cues for safe hand placement and to back all the way to bed before sitl Min A for sit to stand  Ambulation/Gait Ambulation/Gait assistance: Min guard Ambulation Distance (Feet): 6 Feet Assistive device: Rolling walker (2 wheeled)   Gait velocity: slow   General Gait Details: Pt felt very weak in stand; O2 saturation 87% on 2L    Stairs            Wheelchair Mobility    Modified Rankin (Stroke Patients Only)       Balance Overall balance assessment: Needs assistance         Standing balance support: Bilateral upper extremity  supported Standing balance-Leahy Scale: Fair                      Retail banker Exercises - Lower Extremity Quad Sets: Strengthening;Both;15 reps;Supine Gluteal Sets: Strengthening;Both;15 reps;Supine Long Arc Quad: AROM;Both;20 reps;Seated Hip ABduction/ADduction: AROM;Both;20 reps;Seated Hip Flexion/Marching: AROM;Both;20 reps;Seated Toe Raises: AROM;Both;20 reps;Seated Heel Raises: AROM;Both;20 reps;Seated    General Comments        Pertinent Vitals/Pain      Home Living                      Prior Function            PT Goals (current goals can now be found in the care plan section) Progress towards PT goals: Progressing toward goals (slowly)    Frequency  Min 2X/week    PT Plan Current plan remains appropriate    Co-evaluation             End of Session Equipment Utilized During Treatment: Gait belt;Oxygen Activity Tolerance: Patient limited by fatigue Patient left: in bed;with call bell/phone within reach;with bed alarm set;with family/visitor present     Time: 4098-1191 PT Time Calculation (min) (ACUTE ONLY): 29 min  Charges:  $Gait Training: 8-22 mins $Therapeutic Exercise: 8-22 mins  G Codes:      Kristeen Miss 10/23/2014, 3:32 PM

## 2014-10-23 NOTE — Care Management Important Message (Signed)
Important Message  Patient Details  Name: Laurie Larsen MRN: [id number] Date of Birth: Oct 27, 1923   Medicare Important Message Given:  Yes-third notification given    Olegario Messier A Allmond 10/23/2014, 11:11 AM

## 2014-10-23 NOTE — Care Management (Signed)
Case followed by CSW for return to [facility] at [place].

## 2014-10-23 NOTE — Progress Notes (Signed)
CC: Right upper quadrant pain Subjective: Patient is status post cholecystostomy tube placed by radiology. She feels better now certainly better than when she came in. She is tolerating a diet. Wants to have advanced at this point. She does live at [place]. Objective: Vital signs in last 24 hours: Temp:  [97.1 F (36.2 C)-97.7 F (36.5 C)] 97.4 F (36.3 C) (09/26 0648) Pulse Rate:  [69-76] 69 (09/26 0648) Resp:  [14-18] 16 (09/26 0648) BP: (139-164)/(69-80) 139/69 mmHg (09/26 0648) SpO2:  [90 %-99 %] 90 % (09/26 0648)    Intake/Output from previous day: 09/25 0701 - 09/26 0700 In: 720 [P.O.:720] Out: 430 [Urine:200; Drains:230] Intake/Output this shift:    Physical exam:  No scleral icterus Soft abdomen with right upper quadrant cholecystostomy tube with clear bilious drainage. Abdomen otherwise soft and nontender.  nontender calves Alert and oriented.  Lab Results: CBC   Recent Labs  10/22/14 0708 10/23/14 0622  WBC 10.8 11.6*  HGB 12.8 12.5  HCT 37.5 37.5  PLT 190 201   BMET  Recent Labs  10/22/14 0708 10/23/14 0622  NA 134* 135  K 4.0 4.2  CL 103 101  CO2 25 28  GLUCOSE 101* 101*  BUN 10 11  CREATININE 0.52 0.74  CALCIUM 8.2* 8.4*   PT/INR  Recent Labs  10/22/14 0708 10/23/14 0622  LABPROT 24.6* 33.5*  INR 2.20 3.29   ABG No results for input(s): PHART, HCO3 in the last 72 hours.  Invalid input(s): PCO2, PO2  Studies/Results: No results found.  Anti-infectives: Anti-infectives    Start     Dose/Rate Route Frequency Ordered Stop   10/22/14 1500  sulfamethoxazole-trimethoprim (BACTRIM DS,SEPTRA DS) 800-160 MG per tablet 1 tablet     1 tablet Oral Every 12 hours 10/22/14 1457     10/18/14 1000  meropenem (MERREM) 1 g in sodium chloride 0.9 % 100 mL IVPB  Status:  Discontinued     1 g 200 mL/hr over 30 Minutes Intravenous Every 12 hours 10/18/14 0832 10/21/14 0855   10/17/14 2145  metroNIDAZOLE (FLAGYL) IVPB 500 mg  Status:   Discontinued     500 mg 100 mL/hr over 60 Minutes Intravenous Every 6 hours 10/17/14 2134 10/21/14 0855   10/17/14 1830  meropenem (MERREM) 500 mg in sodium chloride 0.9 % 50 mL IVPB  Status:  Discontinued     500 mg 100 mL/hr over 30 Minutes Intravenous Every 12 hours 10/17/14 1812 10/18/14 0832      Assessment/Plan:  Recommend continuing IV anabiotic's white blood cell count slightly elevated today we'll consider transfer back to [facility] tomorrow if bed available. She will require laparoscopic cholecystectomy by Dr. Tonita Cong in the near future  Lattie Haw, MD, [facility]  10/23/2014

## 2014-10-23 NOTE — Progress Notes (Signed)
Pharmacy Consult for Electrolyte Management  Allergies  Allergen Reactions  . Amoxicillin Other (See Comments)    Reaction:  Weakness  . Digoxin Other (See Comments)    Reaction:  Weakness and dehydration  . Risedronate Sodium Rash    Patient Measurements: Height:  (160 cm) Weight: 102 lb 4.7 oz (46.4 kg) IBW/kg (Calculated) : 52.4  Vital Signs: Temp: 97.4 F (36.3 C) (09/26 0648) Temp Source: Oral (09/26 0648) BP: 139/69 mmHg (09/26 1610) Pulse Rate: 69 (09/26 0648) Intake/Output from previous day: 09/25 0701 - 09/26 0700 In: 720 [P.O.:720] Out: 430 [Urine:200; Drains:230] Intake/Output from this shift:    Labs:  Recent Labs  10/21/14 0529 10/22/14 0708 10/23/14 0622  WBC  --  10.8 11.6*  HGB  --  12.8 12.5  HCT  --  37.5 37.5  PLT  --  190 201  INR 1.70 2.20 3.29     Recent Labs  10/21/14 0529 10/22/14 0708 10/23/14 0622  NA 136 134* 135  K 3.2* 4.0 4.2  CL 104 103 101  CO2 GLUCOSE 102* 101* 101*  BUN CREATININE 0.51 0.52 0.74  CALCIUM 7.7* 8.2* 8.4*  MG 1.7  --  1.9  PHOS 2.6  --  3.1   Estimated Creatinine Clearance: 34.9 mL/min (by C-G formula based on Cr of 0.74).   No results for input(s): GLUCAP in the last 72 hours.  Medical History: Past Medical History  Diagnosis Date  . Permanent atrial fibrillation     a. Chronic Coumadin.  [name] HTN (hypertension)   . Chronic diastolic CHF (congestive heart failure), NYHA class 1     a. 04/2013 Echo: EF 50-55%, triv MR, mildly dil LA, PASP , mild TR, mild-mod RA dil.  [name] CAD (coronary artery disease)     a. 2005 Cath: 100% dLCX-->Med managed.  . Sick sinus syndrome     a. 05/2008 s/p MDT EnRhythm DC PPM (Allred).  . Chronic pain     a. ? due to arthritis  . Glaucoma   . Hyperlipidemia   . Diverticulitis large intestine   . Allergy   . Phlebitis     a. after pacemeker placement in 2010.  [name] Colon polyps   . Hypothyroidism   . Transfusion history     a. After  childbirth 60 years ago (1957)  . Rash/skin eruption     a. Multiple episodes - wide distribution-intermittent, possibly drug rash.  . Pulmonary HTN     a. PASP on echo 04/2013.  [name] Chronic Right Pleural Effusion     a. 07/2012 s/p thoracentesis - Followed by Dr. Delford Field [facility].  . Arthritis   . Bronchitis   . Hay fever   . Colon polyps   . History of blood transfusion   . Urine incontinence   . Back complaints     Medications:  Scheduled:  . antiseptic oral rinse  7 mL Mouth Rinse q12n4p  . atenolol  25 mg Oral BID  . atorvastatin  40 mg Oral q1800  . chlorhexidine  15 mL Mouth Rinse BID  . diltiazem  180 mg Oral Daily  . dorzolamide-timolol  1 drop Both Eyes BID  . feeding supplement  1 Container Oral TID WC  . furosemide  60 mg Oral Daily  . latanoprost  1 drop Both Eyes QHS  . levothyroxine  112 mcg Oral QAC breakfast  . magnesium oxide  400 mg Oral BID  .  polyethylene glycol  17 g Oral QHS  . potassium chloride  10 mEq Oral Daily  . potassium chloride  40 mEq Oral Once  . sulfamethoxazole-trimethoprim  1 tablet Oral Q12H  . Warfarin - Pharmacist Dosing Inpatient   Does not apply q1800   Infusions:   PRN: hydrALAZINE, HYDROcodone-acetaminophen, morphine injection, ondansetron **OR** ondansetron (ZOFRAN) IV  Assessment: Pharmacy consulted to assist in managing electrolytes in this 81 y/o F with acute cholecystitis.   Plan:  Corrected calcium is 8.7 after 1 gram of calcium gluconate yesterday so will repeat 1 gram today. Potassium and phosphorus are both slightly low so will replace with KPhos 40 meq iv once. Will f/u AM labs.   9/23 Potassium and phosphate still low. Corrected calcium 8.4. Potassium phosphate 30 mmol and calcium gluconate 1 gram x1 ordered. Recheck labs in AM.  9/24 AM K+ 3.2. Corrected calcium 8.3. KCl 20 mEq x3 doses and calcium carbonate 1250 mg x 2 doses ordered. BMP in AM.   9/25 No supplementation warranted at this time.   Follow up electrolytes with AM labs.   9/26  Electrolytes WNL. No supplementation warranted at this time.  Follow up electrolytes with AM labs.     Bari Mantis PharmD Clinical Pharmacist 10/23/2014 7:39 AM

## 2014-10-23 NOTE — Progress Notes (Signed)
ANTICOAGULATION CONSULT NOTE - Follow-up  Pharmacy Consult for Warfarin Indication: atrial fibrillation  Allergies  Allergen Reactions  . Amoxicillin Other (See Comments)    Reaction:  Weakness  . Digoxin Other (See Comments)    Reaction:  Weakness and dehydration  . Risedronate Sodium Rash    Patient Measurements: Height:  (160 cm) Weight: 102 lb 4.7 oz (46.4 kg) IBW/kg (Calculated) : 52.4   Vital Signs: Temp: 97.4 F (36.3 C) (09/26 0648) Temp Source: Oral (09/26 0648) BP: 139/69 mmHg (09/26 0648) Pulse Rate: 69 (09/26 0648)  Labs:  Recent Labs  10/21/14 0529 10/22/14 0708 10/23/14 0622  HGB  --  12.8 12.5  HCT  --  37.5 37.5  PLT  --  190 201  LABPROT 20.2* 24.6* 33.5*  INR 1.70 2.20 3.29  CREATININE 0.51 0.52 0.74    Estimated Creatinine Clearance: 34.9 mL/min (by C-G formula based on Cr of 0.74).   Medical History: Past Medical History  Diagnosis Date  . Permanent atrial fibrillation     a. Chronic Coumadin.  [name] HTN (hypertension)   . Chronic diastolic CHF (congestive heart failure), NYHA class 1     a. 04/2013 Echo: EF 50-55%, triv MR, mildly dil LA, PASP , mild TR, mild-mod RA dil.  [name] CAD (coronary artery disease)     a. 2005 Cath: 100% dLCX-->Med managed.  . Sick sinus syndrome     a. 05/2008 s/p MDT EnRhythm DC PPM ([facility]red).  . Chronic pain     a. ? due to arthritis  . Glaucoma   . Hyperlipidemia   . Diverticulitis large intestine   . Allergy   . Phlebitis     a. after pacemeker placement in 2010.  [name] Colon polyps   . Hypothyroidism   . Transfusion history     a. After childbirth 60 years ago (1957)  . Rash/skin eruption     a. Multiple episodes - wide distribution-intermittent, possibly drug rash.  . Pulmonary HTN     a. PASP on echo 04/2013.  [name] Chronic Right Pleural Effusion     a. 07/2012 s/p thoracentesis - Followed by Dr. Delford Field [facility] Pulmonology.  . Arthritis   . Bronchitis   . Hay fever   . Colon polyps   .  History of blood transfusion   . Urine incontinence   . Back complaints     Medications:  Scheduled:  . antiseptic oral rinse  7 mL Mouth Rinse q12n4p  . atenolol  25 mg Oral BID  . atorvastatin  40 mg Oral q1800  . chlorhexidine  15 mL Mouth Rinse BID  . diltiazem  180 mg Oral Daily  . dorzolamide-timolol  1 drop Both Eyes BID  . feeding supplement  1 Container Oral TID WC  . furosemide  60 mg Oral Daily  . latanoprost  1 drop Both Eyes QHS  . levothyroxine  112 mcg Oral QAC breakfast  . magnesium oxide  400 mg Oral BID  . polyethylene glycol  17 g Oral QHS  . potassium chloride  10 mEq Oral Daily  . potassium chloride  40 mEq Oral Once  . sulfamethoxazole-trimethoprim  1 tablet Oral Q12H  . Warfarin - Pharmacist Dosing Inpatient   Does not apply q1800    Assessment: 81 yo female who underwent percutaneous chloecystostomy drain placement on 9/22.  Home dose of warfarin held for surgery.  Per surgery, ok to restart warfarin tonight. Pharmacy consulted to dose.    INR  Dose 9/22 1.28 Restart home dose warfarin  Goal of Therapy:  INR 2-3 Monitor platelets by anticoagulation protocol: Yes   Plan:  Will restart pt on home dose of warfarin  daily/2.5mg  Saturday.  Of note, pt on Flagyl so we may see an increase in INR.  Monitor INR daily, adjust as needed.  9/23 INR 1.47 Continue current regimen. INR in AM.  9/24 INR 1.7. Continue current regimen. Recheck INR in AM.  9/25 INR 2.2. Continue current dosing.  Recheck INR in AM.   9/26 INR 3.29.  Will hold Coumadin dose today and recheck INR in am. INR increase likely due to Septra(Bactrim) interaction.   Angelique Blonder, PharmD Clinical Pharmacist 10/23/2014,7:34 AM

## 2014-10-24 ENCOUNTER — Encounter
Admission: RE | Admit: 2014-10-24 | Discharge: 2014-10-24 | Disposition: A | Discharge status: Home / Self Care | Payer: Medicare Other | Source: Ambulatory Visit | Attending: Internal Medicine | Admitting: Internal Medicine

## 2014-10-24 LAB — BASIC METABOLIC PANEL
ANION GAP: 10 (ref 5–15)
BUN: 17 mg/dL (ref 6–20)
CHLORIDE: 98 mmol/L — AB (ref 101–111)
CO2: 23 mmol/L (ref 22–32)
CREATININE: 1.03 mg/dL — AB (ref 0.44–1.00)
Calcium: 8.2 mg/dL — ABNORMAL LOW (ref 8.9–10.3)
GFR calc non Af Amer: 47 mL/min — ABNORMAL LOW (ref >60)
GFR, EST AFRICAN AMERICAN: 54 mL/min — AB (ref >60)
GLUCOSE: 99 mg/dL (ref 65–99)
Potassium: 4.1 mmol/L (ref 3.5–5.1)
Sodium: 131 mmol/L — ABNORMAL LOW (ref 135–145)

## 2014-10-24 LAB — PROTIME-INR
INR: 3.42
PROTHROMBIN TIME: 34.5 s — AB (ref 11.4–15.0)

## 2014-10-24 MED ORDER — BOOST / RESOURCE BREEZE PO LIQD: 1.0000 | Freq: Three times a day (TID) | ORAL | Status: DC

## 2014-10-24 MED ORDER — SULFAMETHOXAZOLE-TRIMETHOPRIM 800-160 MG PO TABS: 1.0000 | ORAL_TABLET | Freq: Two times a day (BID) | ORAL | Status: DC

## 2014-10-24 MED ORDER — HYDROCODONE-ACETAMINOPHEN 10-325 MG PO TABS: 1.0000 | ORAL_TABLET | ORAL | Status: DC | PRN

## 2014-10-24 NOTE — Progress Notes (Signed)
ANTICOAGULATION CONSULT NOTE - Follow-up  Pharmacy Consult for Warfarin Indication: atrial fibrillation  Allergies  Allergen Reactions  . Amoxicillin Other (See Comments)    Reaction:  Weakness  . Digoxin Other (See Comments)    Reaction:  Weakness and dehydration  . Risedronate Sodium Rash    Patient Measurements: Height:  (160 cm) Weight: 102 lb 4.7 oz (46.4 kg) IBW/kg (Calculated) : 52.4   Vital Signs: Temp: 98.9 F (37.2 C) (09/27 0552) Temp Source: Oral (09/27 0552) BP: 99/86 mmHg (09/27 0552) Pulse Rate: 69 (09/27 0552)  Labs:  Recent Labs  10/22/14 0708 10/23/14 0622 10/24/14 0608  HGB 12.8 12.5  --   HCT 37.5 37.5  --   PLT 190 201  --   LABPROT 24.6* 33.5* 34.5*  INR 2.20 3.29 3.42  CREATININE 0.52 0.74 1.03*    Estimated Creatinine Clearance: 27.1 mL/min (by C-G formula based on Cr of 1.03).   Medical History: Past Medical History  Diagnosis Date  . Permanent atrial fibrillation     a. Chronic Coumadin.  [name] HTN (hypertension)   . Chronic diastolic CHF (congestive heart failure), NYHA class 1     a. 04/2013 Echo: EF 50-55%, triv MR, mildly dil LA, PASP , mild TR, mild-mod RA dil.  [name] CAD (coronary artery disease)     a. 2005 Cath: 100% dLCX-->Med managed.  . Sick sinus syndrome     a. 05/2008 s/p MDT EnRhythm DC PPM (Allred).  . Chronic pain     a. ? due to arthritis  . Glaucoma   . Hyperlipidemia   . Diverticulitis large intestine   . Allergy   . Phlebitis     a. after pacemeker placement in 2010.  [name] Colon polyps   . Hypothyroidism   . Transfusion history     a. After childbirth 60 years ago (1957)  . Rash/skin eruption     a. Multiple episodes - wide distribution-intermittent, possibly drug rash.  . Pulmonary HTN     a. PASP on echo 04/2013.  [name] Chronic Right Pleural Effusion     a. 07/2012 s/p thoracentesis - Followed by Dr. Delford Field [facility] Pulmonology.  . Arthritis   . Bronchitis   . Hay fever   . Colon polyps   .  History of blood transfusion   . Urine incontinence   . Back complaints     Medications:  Scheduled:  . antiseptic oral rinse  7 mL Mouth Rinse q12n4p  . atenolol  25 mg Oral BID  . atorvastatin  40 mg Oral q1800  . chlorhexidine  15 mL Mouth Rinse BID  . diltiazem  180 mg Oral Daily  . dorzolamide-timolol  1 drop Both Eyes BID  . feeding supplement  1 Container Oral TID WC  . furosemide  60 mg Oral Daily  . latanoprost  1 drop Both Eyes QHS  . levothyroxine  112 mcg Oral QAC breakfast  . magnesium oxide  400 mg Oral BID  . polyethylene glycol  17 g Oral QHS  . potassium chloride  10 mEq Oral Daily  . potassium chloride  40 mEq Oral Once  . sulfamethoxazole-trimethoprim  1 tablet Oral Q12H  . Warfarin - Pharmacist Dosing Inpatient   Does not apply q1800    Assessment: 81 yo female who underwent percutaneous chloecystostomy drain placement on 9/22.  Home dose of warfarin held for surgery. PTA dosing of awrfarin 5 mg daily except 2.5 mg on Saturdays.  INR Dose 9/26     3.29 Warfarin dose held 9/27  3.42  Goal of Therapy:  INR 2-3 Monitor platelets by anticoagulation protocol: Yes   Plan:  INR remains supratherapeutic at 3.42. Will continue to hold warfarin for today and recheck INR in AM.  INR increase likely due to Septra(Bactrim) interaction.   Pharmacy will continue to follow.  Sherry Ruffing, PharmD Clinical Pharmacist 10/24/2014,7:56 AM

## 2014-10-24 NOTE — Discharge Instructions (Signed)
May shower, dry dressing around drainage catheter as needed Resume all home medications Follow-up with Dr. Tonita Cong on Friday

## 2014-10-24 NOTE — Progress Notes (Signed)
Patient discharged to [facility].Report called Sports coach at [facility]. Patient is alert and oriented ambulates with assistance drainage tube emptied of 100 cc's of green secretions. IV discontinued site without S/S of infection or infiltration. EMS called and arrived to take patient to [facility]

## 2014-10-24 NOTE — Discharge Summary (Signed)
Physician Discharge Summary  Patient ID: Laurie Larsen MRN: [id number] DOB/AGE: Sep 19, 1923 81 y.o.  Admit date: 10/17/2014 Discharge date: 10/24/2014   Discharge Diagnoses:  Principal Problem:   Chronic anticoagulation Active Problems:   Essential hypertension, benign   ATRIAL FIBRILLATION, CHRONIC   CHF (congestive heart failure), NYHA class I   Cholecystitis, acute   Cholecystitis   Malnutrition of moderate degree   Procedures: Cholecystostomy tube placement  Hospital Course: This patient with multiple medical problems and a diagnosis of acute cholecystitis. She underwent cholecystostomy tube placement in radiology as ordered by Dr. Tonita Cong for treatment of the acute cholecystitis. He is doing well at this point and is tolerating a regular diet she is instructed to shower and utilize her regular home medications as directed. She will follow-up with Dr. Lovett Sox at the end of this week.  Consults: Internal medicine  Disposition: 01-Home or Self Care  Discharge Instructions    Patient may shower    Complete by:  As directed      Tube site care    Complete by:  As directed             Medication List    TAKE these medications        acetaminophen 650 MG CR tablet  Commonly known as:  TYLENOL  Take 1,300 mg by mouth at bedtime as needed for pain.     atenolol 25 MG tablet  Commonly known as:  TENORMIN  Take 1 tablet (25 mg total) by mouth 2 (two) times daily.     atorvastatin 40 MG tablet  Commonly known as:  LIPITOR  Take 0.5 tablets (20 mg total) by mouth at bedtime.     COSOPT 22.3-6.8 MG/ML ophthalmic solution  Generic drug:  dorzolamide-timolol  Place 1 drop into both eyes 2 (two) times daily.     diltiazem 180 MG 24 hr capsule  Commonly known as:  CARDIZEM CD  Take 1 capsule (180 mg total) by mouth daily.     feeding supplement Liqd  Take 1 Container by mouth 3 (three) times daily with meals.     furosemide 40 MG tablet  Commonly known as:  LASIX   Take 60 mg by mouth daily.     HYDROcodone-acetaminophen 10-325 MG tablet  Commonly known as:  NORCO  Take 1-2 tablets by mouth 4 (four) times daily as needed for severe pain.     HYDROcodone-acetaminophen 10-325 MG tablet  Commonly known as:  NORCO  Take 1 tablet by mouth every 4 (four) hours as needed for moderate pain.     levothyroxine 112 MCG tablet  Commonly known as:  SYNTHROID, LEVOTHROID  Take 1 tablet (112 mcg total) by mouth daily.     polyethylene glycol packet  Commonly known as:  MIRALAX / GLYCOLAX  Take 17 g by mouth at bedtime.     potassium chloride 10 MEQ tablet  Commonly known as:  K-DUR  Take 10 mEq by mouth 2 (two) times daily.     sulfamethoxazole-trimethoprim 800-160 MG tablet  Commonly known as:  BACTRIM DS,SEPTRA DS  Take 1 tablet by mouth every 12 (twelve) hours.     traMADol 50 MG tablet  Commonly known as:  ULTRAM  Take 50-100 mg by mouth 4 (four) times daily as needed for moderate pain.     Travoprost (BAK Free) 0.004 % Soln ophthalmic solution  Commonly known as:  TRAVATAN  Place 1 drop into both eyes at bedtime.     Vitamin  D (Ergocalciferol) 50000 UNITS Caps capsule  Commonly known as:  DRISDOL  Take 50,000 Units by mouth every 7 (seven) days. Pt takes on Saturday.     warfarin 5 MG tablet  Commonly known as:  COUMADIN  TAKE AS DIRECTED BY COUMADIN CLINIC         Lattie Haw, MD, FACS

## 2014-10-24 NOTE — Progress Notes (Signed)
CSW was notified by [facility] that bed is ready at facility. CSW followed up with Pt who had her husband and extended family at bedside. Pt stated that she is having a hard time adjusting to being "sick" and is getting "sadder" in the hospital. CSW normalized Pt's mood after 7 days in the hospital away from her husband and home.  Pt states that she is ready to leave hospital. CSW discussed [facility] skilled unit at [facility], patient familiar with unit from visiting friends. Pt's goal is to return to apartment with husband.  CSW notified MD of confirmed plan. MD cleared Pt for dc to SNF.  CSW left message with Pt's son Jillyn Hidden who was not at bedside.  Pt's husband remaining at bedside until family picks him up and EMS transfers Pt.   CSW prepared dc packet and sent dc summary to snf.  RN to call room and report to SNF.   No further CSW needs at this time.   Wilford Grist, LCSW [phone]

## 2014-10-24 NOTE — Progress Notes (Signed)
CC: Cholecystitis Subjective: Status post cholecystostomy tube. Patient is tolerating a diet this morning but does not feel well same that she is slightly nauseated she is not sure if she feels up to going back to [place] today. She has no other specific complaints.  Objective: Vital signs in last 24 hours: Temp:  [97.7 F (36.5 C)-98.9 F (37.2 C)] 98.9 F (37.2 C) (09/27 0552) Pulse Rate:  [69-81] 69 (09/27 0552) Resp:  [14-18] 14 (09/27 0552) BP: (99-114)/(51-86) 110/80 mmHg (09/27 0813) SpO2:  [89 %-96 %] 96 % (09/27 0552)    Intake/Output from previous day: 09/26 0701 - 09/27 0700 In: 1200 [P.O.:1200] Out: 675 [Urine:500; Drains:175] Intake/Output this shift:    Physical exam:  Abdomen is soft and nontender cholecystostomy tube in place and draining ileus fluid. Calves are nontender. No scleral icterus.  Lab Results: CBC   Recent Labs  10/22/14 0708 10/23/14 0622  WBC 10.8 11.6*  HGB 12.8 12.5  HCT 37.5 37.5  PLT 190 201   BMET  Recent Labs  10/23/14 0622 10/24/14 0608  NA 135 131*  K 4.2 4.1  CL 101 98*  CO2 28 23  GLUCOSE 101* 99  BUN 11 17  CREATININE 0.74 1.03*  CALCIUM 8.4* 8.2*   PT/INR  Recent Labs  10/23/14 0622 10/24/14 0608  LABPROT 33.5* 34.5*  INR 3.29 3.42   ABG No results for input(s): PHART, HCO3 in the last 72 hours.  Invalid input(s): PCO2, PO2  Studies/Results: No results found.  Anti-infectives: Anti-infectives    Start     Dose/Rate Route Frequency Ordered Stop   10/22/14 1500  sulfamethoxazole-trimethoprim (BACTRIM DS,SEPTRA DS) 800-160 MG per tablet 1 tablet     1 tablet Oral Every 12 hours 10/22/14 1457     10/18/14 1000  meropenem (MERREM) 1 g in sodium chloride 0.9 % 100 mL IVPB  Status:  Discontinued     1 g 200 mL/hr over 30 Minutes Intravenous Every 12 hours 10/18/14 0832 10/21/14 0855   10/17/14 2145  metroNIDAZOLE (FLAGYL) IVPB 500 mg  Status:  Discontinued     500 mg 100 mL/hr over 60 Minutes  Intravenous Every 6 hours 10/17/14 2134 10/21/14 0855   10/17/14 1830  meropenem (MERREM) 500 mg in sodium chloride 0.9 % 50 mL IVPB  Status:  Discontinued     500 mg 100 mL/hr over 30 Minutes Intravenous Every 12 hours 10/17/14 1812 10/18/14 1610      Assessment/Plan:  Cholecystitis treated with cholecystostomy tube Doing quite well at this point however she does not feel up to being transferred today I will see her later on this afternoon and consider transfer if she is feeling better. I discussed this with the RN and bed placement at [facility] will be discussed.  Lattie Haw, MD, FACS  10/24/2014

## 2014-10-24 NOTE — Progress Notes (Signed)
Physical Therapy Treatment Patient Details Name: Laurie Larsen MRN: [id number] DOB: 04/01/1923 Today's Date: 10/24/2014    History of Present Illness Patient is a pleasant 81 y/o female from [place] that presents with acute abdominal pain, treated for cholecystitis.     PT Comments    Pt notes she is not feeling any better; continues with pain across diaphragm and feels weak. Pt continues to require increased time for all activities and has safety concerns with approaching surface to sit with rolling walker abandonment and lack of awareness with backing up to surface all the way. Pt does demonstrate progress with ambulation distance today, but notes fatigue. Pt's O2 saturation decreases from 90% to 87% on room air post ambulation; pt denies SOB, dizziness or increased weak feeling. SW notes she is working on placement at skilled nursing facility. Continue to progress strength, safety, safe O2 saturation levels with activity and ambulation.   Follow Up Recommendations  SNF     Equipment Recommendations       Recommendations for Other Services       Precautions / Restrictions Restrictions Weight Bearing Restrictions: No    Mobility  Bed Mobility Overal bed mobility: Modified Independent Bed Mobility: Supine to Sit;Sit to Supine     Supine to sit: Modified independent (Device/Increase time) (increased time) Sit to supine: Modified independent (Device/Increase time)   General bed mobility comments:  (increased time)  Transfers Overall transfer level: Needs assistance Equipment used: Rolling walker (2 wheeled) Transfers: Sit to/from Stand Sit to Stand: Min guard         General transfer comment: cues for safe hand placement and to back all the way to bed before sitl Min A for sit to stand  Ambulation/Gait Ambulation/Gait assistance: Min guard Ambulation Distance (Feet): 110 Feet Assistive device: Rolling walker (2 wheeled) Gait Pattern/deviations:  Step-through pattern;Decreased step length - right;Decreased step length - left;Decreased stride length;Narrow base of support;Trunk flexed (several stand rest breaks) Gait velocity: slow Gait velocity interpretation: <1.8 ft/sec, indicative of risk for recurrent falls General Gait Details: O2 saturation decreased to 87% post ambulation; asymptomatic    Stairs            Wheelchair Mobility    Modified Rankin (Stroke Patients Only)       Balance Overall balance assessment: Needs assistance         Standing balance support: Bilateral upper extremity supported Standing balance-Leahy Scale: Fair                      Financial planner Comments        Pertinent Vitals/Pain      Home Living                      Prior Function            PT Goals (current goals can now be found in the care plan section) Progress towards PT goals: Progressing toward goals    Frequency  Min 2X/week    PT Plan Current plan remains appropriate    Co-evaluation             End of Session Equipment Utilized During Treatment: Gait belt Activity Tolerance: Patient limited by fatigue (weakness) Patient left: in bed;with call bell/phone  within reach;with bed alarm set;with family/visitor present     Time: 1610-9604 PT Time Calculation (min) (ACUTE ONLY): 24 min  Charges:  $Gait Training: 23-37 mins                    G Codes:      Kristeen Miss 10/24/2014, 11:22 AM

## 2014-10-24 NOTE — Progress Notes (Signed)
As an addendum to the discharge summary, the patient is on Coumadin as well as Bactrim. She has been on Bactrim here in the hospital. And her Coumadin has been restarted. With the interaction between Coumadin and Bactrim she will require a PT/INR to be drawn on Friday prior to or after her visit with Dr. Tonita Cong in the clinic.

## 2014-10-24 NOTE — Clinical Social Work Placement (Signed)
   CLINICAL SOCIAL WORK PLACEMENT  NOTE  Date:  10/24/2014  Patient Details  Name: Laurie Larsen MRN: [id number] Date of Birth: July 08, 1923  Clinical Social Work is seeking post-discharge placement for this patient at the Skilled  Nursing Facility level of care (*CSW will initial, date and re-position this form in  chart as items are completed):  Yes   Patient/family provided with [facility] Clinical Social Work Department's list of facilities offering this level of care within the geographic area requested by the patient (or if unable, by the patient's family).  Yes   Patient/family informed of their freedom to choose among providers that offer the needed level of care, that participate in Medicare, Medicaid or managed care program needed by the patient, have an available bed and are willing to accept the patient.  Yes   Patient/family informed of [facility]'s ownership interest in [facility] and [facility], as well as of the fact that they are under no obligation to receive care at these facilities.  PASRR submitted to EDS on       PASRR number received on       Existing PASRR number confirmed on 10/24/14     FL2 transmitted to all facilities in geographic area requested by pt/family on 10/22/14     FL2 transmitted to all facilities within larger geographic area on       Patient informed that his/her managed care company has contracts with or will negotiate with certain facilities, including the following:        Yes   Patient/family informed of bed offers received.  Patient chooses bed at [facility]     Physician recommends and patient chooses bed at      Patient to be transferred to [facility] on 10/24/14.  Patient to be transferred to facility by EMS     Patient family notified on 10/24/14 of transfer.  Name of family member notified:  husband at bedside     PHYSICIAN Please sign FL2     Additional Comment:     _______________________________________________ Ned Card, LCSW 10/24/2014, 3:19 PM

## 2014-10-24 NOTE — Progress Notes (Signed)
Physical Therapy Treatment Patient Details Name: Laurie Larsen MRN: [id number] DOB: 1923-07-27 Today's Date: 10/24/2014    History of Present Illness Patient is a pleasant 81 y/o female from the [facility] that presents with acute abdominal pain, treated for cholecystitis.     PT Comments    Treatment attempted this a.m.; social work with patient and family; attempt treatment later this a.m  Follow Up Recommendations        Equipment Recommendations       Recommendations for Other Services       Precautions / Restrictions      Mobility  Bed Mobility                  Transfers                    Ambulation/Gait                 Stairs            Wheelchair Mobility    Modified Rankin (Stroke Patients Only)       Balance                                    Cognition                            Exercises      General Comments        Pertinent Vitals/Pain      Home Living                      Prior Function            PT Goals (current goals can now be found in the care plan section)      Frequency       PT Plan      Co-evaluation             End of Session           Time:  -     Charges:                       G Codes:      Kristeen Miss 10/24/2014, 10:39 AM

## 2014-10-25 ENCOUNTER — Telehealth: Payer: Self-pay | Admitting: Cardiology

## 2014-10-25 NOTE — Telephone Encounter (Signed)
She was apparently DC from [facility] yesterday 9/27to nursing facility. Had gallbladder drain placed for acute cholecystitis. Looks like she was DC to [facility].   Peter Swaziland MD, [facility]

## 2014-10-25 NOTE — Telephone Encounter (Signed)
Lana informed me patient is in hospital at [facility], also that they did receive the INR orders that were faxed.  Sent to Dr Swaziland for his review.

## 2014-10-26 LAB — BASIC METABOLIC PANEL
ANION GAP: 10 (ref 5–15)
BUN: 23 mg/dL — ABNORMAL HIGH (ref 6–20)
CHLORIDE: 97 mmol/L — AB (ref 101–111)
CO2: 22 mmol/L (ref 22–32)
Calcium: 8.5 mg/dL — ABNORMAL LOW (ref 8.9–10.3)
Creatinine, Ser: 1.17 mg/dL — ABNORMAL HIGH (ref 0.44–1.00)
GFR, EST AFRICAN AMERICAN: 46 mL/min — AB (ref >60)
GFR, EST NON AFRICAN AMERICAN: 40 mL/min — AB (ref >60)
Glucose, Bld: 96 mg/dL (ref 65–99)
POTASSIUM: 4.1 mmol/L (ref 3.5–5.1)
SODIUM: 129 mmol/L — AB (ref 135–145)

## 2014-10-26 LAB — PROTIME-INR
INR: 2.05
Prothrombin Time: 23.3 seconds — ABNORMAL HIGH (ref 11.4–15.0)

## 2014-10-27 ENCOUNTER — Ambulatory Visit (INDEPENDENT_AMBULATORY_CARE_PROVIDER_SITE_OTHER): Payer: Medicare Other | Admitting: General Surgery

## 2014-10-27 ENCOUNTER — Encounter: Payer: Self-pay | Admitting: General Surgery

## 2014-10-27 VITALS — BP 107/66 | HR 70 | Temp 97.3°F | Ht 63.0 in | Wt 100.0 lb

## 2014-10-27 DIAGNOSIS — I2510 Atherosclerotic heart disease of native coronary artery without angina pectoris: Secondary | ICD-10-CM | POA: Diagnosis not present

## 2014-10-27 DIAGNOSIS — K819 Cholecystitis, unspecified: Secondary | ICD-10-CM | POA: Diagnosis not present

## 2014-10-27 NOTE — Patient Instructions (Signed)
For right now we want you to finish your antibiotic. You will need to leave your drain for the next 5 weeks. In five weeks we will schedule a X-Ray to check how your gallbladder is working. From there, we will decide what to do next.

## 2014-10-27 NOTE — Progress Notes (Signed)
Outpatient Surgical Follow Up  10/27/2014  Laurie Larsen is an 81 y.o. female.   Chief Complaint  Patient presents with  . Follow-up    ED 10/17/2014    HPI: 81 year old female returns to clinic for follow-up status post percutaneous cholecystostomy drain placement for treatment of acute cholecystitis. She continues to complain of fatigue and make statements consistent with depression. However, she states that her energy level and pain level have improved since discharge from the hospital. But she continues to have a negative outlook and make statements of wishing that this had killed her. She currently denies any fevers, chills, chest pain, shortness of breath. She does state she has somewhat constant nausea and that she's been having loose watery stools. She still on antibiotics and continues to have her cholecystostomy drain.  Past Medical History  Diagnosis Date  . Permanent atrial fibrillation     a. Chronic Coumadin.  [name] HTN (hypertension)   . Chronic diastolic CHF (congestive heart failure), NYHA class 1     a. 04/2013 Echo: EF 50-55%, triv MR, mildly dil LA, PASP 41mHg, mild TR, mild-mod RA dil.  .Marland KitchenCAD (coronary artery disease)     a. 2005 Cath: 100% dLCX-->Med managed.  . Sick sinus syndrome     a. 05/2008 s/p MDT EnRhythm DC PPM ([facility]).  . Chronic pain     a. ? due to arthritis  . Glaucoma   . Hyperlipidemia   . Diverticulitis large intestine   . Allergy   . Phlebitis     a. after pacemeker placement in 2010.  .Marland KitchenColon polyps   . Hypothyroidism   . Transfusion history     a. After childbirth 60 years ago (1957)  . Rash/skin eruption     a. Multiple episodes - wide distribution-intermittent, possibly drug rash.  . Pulmonary HTN     a. PASP 48mg on echo 04/2013.  . Marland Kitchenhronic Right Pleural Effusion     a. 07/2012 s/p thoracentesis - [facility]  . Arthritis   . Bronchitis   . Hay fever   . Colon polyps   . History of blood  transfusion   . Urine incontinence   . Back complaints     Past Surgical History  Procedure Laterality Date  . Appendectomy  1939  . Abdominal hysterectomy  1977  . Tonsillectomy  [age]rs old  . Kidney surgery  1955    Right  . Ankle fracture surgery  1979    Left, pin  . Pacemaker insertion  06/20/08    MDT EnRhythm DR implanted by Dr KeVerlon Setting. Breast surgery      Needle guided excision, right breast calcification  . Breast fibroadenoma surgery  2010    Right  . Skin graft      left leg  . Breast biopsy  2011    Family History  Problem Relation Age of Onset  . Cancer Mother     colon with mets  . Diabetes Mother   . Heart disease Father   . Hyperlipidemia Father   . Hypertension Father   . Stroke Brother     brother who died '1February 29, 2024. Cancer Paternal Aunt     breast  . Cancer Cousin     uterine, lung cancer  . Heart attack Brother   . Heart attack Father     Social History:  reports that she has never smoked. She has never used smokeless tobacco. She  reports that she does not drink alcohol or use illicit drugs.  Allergies:  Allergies  Allergen Reactions  . Amoxicillin Other (See Comments)    Reaction:  Weakness  . Cetylpyridinium Other (See Comments)    weakness  . Digoxin Other (See Comments)    Reaction:  Weakness and dehydration  . Risedronate Sodium Rash    Medications reviewed.    ROS A multipoint review of systems was completed. All pertinent positives and negatives within the history of present illness remainder negative.   BP 107/66 mmHg  Pulse 70  Temp(Src) 97.3 F (36.3 C) (Oral)  Ht 5' 3"  (1.6 m)  Wt 45.36 kg (100 lb)  BMI 17.72 kg/m2  Physical Exam  Gen.: No acute distress Chest: Clear to auscultation and irregularly irregular Abdomen: Soft, appropriately tender to palpation at the cholecystostomy drain, nondistended with active bowel sounds. Cholecystostomy drain with a proximal 75 cc of bilious output within the drain on exam  today.   Results for orders placed or performed during the hospital encounter of 10/24/14 (from the past 48 hour(s))  Protime-INR     Status: Abnormal   Collection Time: 10/26/14  7:54 AM  Result Value Ref Range   Prothrombin Time 23.3 (H) 11.4 - 15.0 seconds   INR 1.61   Basic metabolic panel     Status: Abnormal   Collection Time: 10/26/14  7:54 AM  Result Value Ref Range   Sodium 129 (L) 135 - 145 mmol/L   Potassium 4.1 3.5 - 5.1 mmol/L   Chloride 97 (L) 101 - 111 mmol/L   CO2 22 22 - 32 mmol/L   Glucose, Bld 96 65 - 99 mg/dL   BUN 23 (H) 6 - 20 mg/dL   Creatinine, Ser 1.17 (H) 0.44 - 1.00 mg/dL   Calcium 8.5 (L) 8.9 - 10.3 mg/dL   GFR calc non Af Amer 40 (L) >60 mL/min   GFR calc Af Amer 46 (L) >60 mL/min    Comment: (NOTE) The eGFR has been calculated using the CKD EPI equation. This calculation has not been validated in all clinical situations. eGFR's persistently <60 mL/min signify possible Chronic Kidney Disease.    Anion gap 10 5 - 15   No results found.  Assessment/Plan:  1. Cholecystitis 81 year old female being treated with cholecystostomy and antibiotics for cholecystitis. Patient makes statements of depression however she is clinically improved since the time of discharge from the hospital. She is maintained on Coumadin for her mechanical heart valve with a therapeutic INR. This will continue to be monitored as she is still on Bactrim for her cholecystitis which may affect her Coumadin level. With a long conversation about her prognosis has related to her gallbladder as well as the timeline for treatment with the drain. The plan is for a drain study performed in approximately 5-6 weeks to confirm whether or not the gallbladder is draining and is normal fashion. If the gallbladder is having adequate normal function could be possible to remove the drain without surgery in a few months. If the gallbladder is not functioning in either she needs to keep the drain  permanently or except surgery to remove her gallbladder time for drain removal. Patient was not happy with any of these options however begrudgingly is accepting of the treatment plan at this time. We'll see the patient back in 4 weeks.     Clayburn Pert  10/27/2014,negative

## 2014-10-28 ENCOUNTER — Encounter
Admission: RE | Admit: 2014-10-28 | Discharge: 2014-10-28 | Disposition: A | Discharge status: Home / Self Care | Payer: Medicare Other | Source: Ambulatory Visit | Attending: Internal Medicine | Admitting: Internal Medicine

## 2014-10-28 DIAGNOSIS — R634 Abnormal weight loss: Secondary | ICD-10-CM | POA: Insufficient documentation

## 2014-10-28 DIAGNOSIS — E871 Hypo-osmolality and hyponatremia: Secondary | ICD-10-CM | POA: Insufficient documentation

## 2014-10-28 DIAGNOSIS — I4891 Unspecified atrial fibrillation: Secondary | ICD-10-CM | POA: Insufficient documentation

## 2014-10-30 ENCOUNTER — Telehealth: Payer: Self-pay | Admitting: *Deleted

## 2014-10-30 NOTE — Telephone Encounter (Signed)
Called [facility] to inquire about patient's overdue INR and spoke with Lawson Fiscal who stated the patient was recently in the hospital and has been discharged to their Long-Term Care facility.  Lawson Fiscal stated that the long term care is managing the patient's INR and dosing her Coumadin because the have a doctor who gives them orders.  Advised that once the patient is discharged back to Assisted Living to notify us to ensure that we can continue to dose and monitor her INR and she verbalized understanding.

## 2014-10-31 ENCOUNTER — Ambulatory Visit: Payer: Medicare Other

## 2014-10-31 LAB — BASIC METABOLIC PANEL
ANION GAP: 10 (ref 5–15)
BUN: 33 mg/dL — ABNORMAL HIGH (ref 6–20)
CALCIUM: 9 mg/dL (ref 8.9–10.3)
CHLORIDE: 104 mmol/L (ref 101–111)
CO2: 23 mmol/L (ref 22–32)
Creatinine, Ser: 1.23 mg/dL — ABNORMAL HIGH (ref 0.44–1.00)
GFR calc non Af Amer: 38 mL/min — ABNORMAL LOW (ref >60)
GFR, EST AFRICAN AMERICAN: 44 mL/min — AB (ref >60)
GLUCOSE: 88 mg/dL (ref 65–99)
POTASSIUM: 4.1 mmol/L (ref 3.5–5.1)
Sodium: 137 mmol/L (ref 135–145)

## 2014-10-31 LAB — PROTIME-INR
INR: 1.5
PROTHROMBIN TIME: 18.3 s — AB (ref 11.4–15.0)

## 2014-11-03 LAB — CBC WITH DIFFERENTIAL/PLATELET
BASOS PCT: 2 %
Basophils Absolute: 0.2 10*3/uL — ABNORMAL HIGH (ref 0–0.1)
Eosinophils Absolute: 0.2 10*3/uL (ref 0–0.7)
Eosinophils Relative: 3 %
HEMATOCRIT: 33.7 % — AB (ref 35.0–47.0)
HEMOGLOBIN: 11.3 g/dL — AB (ref 12.0–16.0)
LYMPHS ABS: 0.7 10*3/uL — AB (ref 1.0–3.6)
LYMPHS PCT: 10 %
MCH: 30.5 pg (ref 26.0–34.0)
MCHC: 33.5 g/dL (ref 32.0–36.0)
MCV: 91 fL (ref 80.0–100.0)
MONOS PCT: 13 %
Monocytes Absolute: 0.9 10*3/uL (ref 0.2–0.9)
NEUTROS ABS: 4.9 10*3/uL (ref 1.4–6.5)
NEUTROS PCT: 72 %
Platelets: 311 10*3/uL (ref 150–440)
RBC: 3.7 MIL/uL — ABNORMAL LOW (ref 3.80–5.20)
RDW: 14.3 % (ref 11.5–14.5)
WBC: 6.9 10*3/uL (ref 3.6–11.0)

## 2014-11-03 LAB — COMPREHENSIVE METABOLIC PANEL
ALT: 28 U/L (ref 14–54)
AST: 28 U/L (ref 15–41)
Albumin: 3.2 g/dL — ABNORMAL LOW (ref 3.5–5.0)
Alkaline Phosphatase: 89 U/L (ref 38–126)
Anion gap: 7 (ref 5–15)
BUN: 57 mg/dL — AB (ref 6–20)
CHLORIDE: 109 mmol/L (ref 101–111)
CO2: 23 mmol/L (ref 22–32)
CREATININE: 1.64 mg/dL — AB (ref 0.44–1.00)
Calcium: 8.8 mg/dL — ABNORMAL LOW (ref 8.9–10.3)
GFR calc Af Amer: 31 mL/min — ABNORMAL LOW (ref >60)
GFR, EST NON AFRICAN AMERICAN: 27 mL/min — AB (ref >60)
Glucose, Bld: 93 mg/dL (ref 65–99)
Potassium: 4.3 mmol/L (ref 3.5–5.1)
SODIUM: 139 mmol/L (ref 135–145)
Total Bilirubin: 0.5 mg/dL (ref 0.3–1.2)
Total Protein: 6.4 g/dL — ABNORMAL LOW (ref 6.5–8.1)

## 2014-11-03 LAB — PROTIME-INR
INR: 1.46
Prothrombin Time: 17.9 seconds — ABNORMAL HIGH (ref 11.4–15.0)

## 2014-11-03 LAB — TSH: TSH: 2.221 u[IU]/mL (ref 0.350–4.500)

## 2014-11-03 LAB — VITAMIN B12: Vitamin B-12: 324 pg/mL (ref 180–914)

## 2014-11-04 LAB — VITAMIN D 25 HYDROXY (VIT D DEFICIENCY, FRACTURES): VIT D 25 HYDROXY: 45.6 ng/mL (ref 30.0–100.0)

## 2014-11-06 LAB — CBC WITH DIFFERENTIAL/PLATELET
BASOS PCT: 1 %
Basophils Absolute: 0.1 10*3/uL (ref 0–0.1)
Eosinophils Absolute: 0.2 10*3/uL (ref 0–0.7)
Eosinophils Relative: 2 %
HEMATOCRIT: 34.5 % — AB (ref 35.0–47.0)
HEMOGLOBIN: 11.7 g/dL — AB (ref 12.0–16.0)
Lymphocytes Relative: 8 %
Lymphs Abs: 0.6 10*3/uL — ABNORMAL LOW (ref 1.0–3.6)
MCH: 31 pg (ref 26.0–34.0)
MCHC: 33.9 g/dL (ref 32.0–36.0)
MCV: 91.6 fL (ref 80.0–100.0)
MONOS PCT: 10 %
Monocytes Absolute: 0.7 10*3/uL (ref 0.2–0.9)
NEUTROS PCT: 79 %
Neutro Abs: 5.6 10*3/uL (ref 1.4–6.5)
Platelets: 248 10*3/uL (ref 150–440)
RBC: 3.77 MIL/uL — ABNORMAL LOW (ref 3.80–5.20)
RDW: 14.8 % — ABNORMAL HIGH (ref 11.5–14.5)
WBC: 7.1 10*3/uL (ref 3.6–11.0)

## 2014-11-06 LAB — COMPREHENSIVE METABOLIC PANEL
ALT: 30 U/L (ref 14–54)
AST: 28 U/L (ref 15–41)
Albumin: 3.5 g/dL (ref 3.5–5.0)
Alkaline Phosphatase: 87 U/L (ref 38–126)
Anion gap: 9 (ref 5–15)
BUN: 45 mg/dL — AB (ref 6–20)
CHLORIDE: 103 mmol/L (ref 101–111)
CO2: 26 mmol/L (ref 22–32)
CREATININE: 1.05 mg/dL — AB (ref 0.44–1.00)
Calcium: 8.6 mg/dL — ABNORMAL LOW (ref 8.9–10.3)
GFR calc Af Amer: 53 mL/min — ABNORMAL LOW (ref >60)
GFR, EST NON AFRICAN AMERICAN: 46 mL/min — AB (ref >60)
GLUCOSE: 100 mg/dL — AB (ref 65–99)
Potassium: 3.7 mmol/L (ref 3.5–5.1)
SODIUM: 138 mmol/L (ref 135–145)
Total Bilirubin: 0.5 mg/dL (ref 0.3–1.2)
Total Protein: 6.6 g/dL (ref 6.5–8.1)

## 2014-11-06 LAB — PROTIME-INR
INR: 1.27
Prothrombin Time: 16.1 seconds — ABNORMAL HIGH (ref 11.4–15.0)

## 2014-11-10 ENCOUNTER — Telehealth: Payer: Self-pay

## 2014-11-10 NOTE — Telephone Encounter (Signed)
Dr. Tonita CongWoodham will be in surgery during this time, please reschedule patient for 10/27 in pm.

## 2014-11-16 ENCOUNTER — Telehealth: Payer: Self-pay | Admitting: General Surgery

## 2014-11-16 NOTE — Telephone Encounter (Signed)
Email sent to Dr Tonita CongWoodham requesting extension.

## 2014-11-16 NOTE — Telephone Encounter (Signed)
Appointment has been changed from 11/21/14 with Dr Tonita CongWoodham to 11/23/14 @ 2:15pm. I have called the [facility] to reschedule the appointment with the patient's nurse. Patient will be advised of change by the nurse.

## 2014-11-16 NOTE — Telephone Encounter (Signed)
Appointment has been changed from 11/23/14 to 11/24/14 due to the son Jillyn Hidden(Gary) not being able to come to the appointment. I have called patients nurse [facility]Judit[facility] to inform her of the change so that the transportation could be available for the date of 11/24/14.   Patient's son Jillyn Hidden(Gary) would like Dr Tonita CongWoodham to extend her Rehabilitation Services to 11/27/14 due to them ending on 11/24/14.  This is due to the unknowing of the next step for his mother till the date of the appointment. Please contact him once this is done at [phone] and [facility] to [facility].

## 2014-11-17 NOTE — Telephone Encounter (Signed)
Returned patient's son Gary's call. In formed Jillyn HiddenGary that Dr Tonita CongWoodham will be back on Monday and will write an extension at that time. Jillyn HiddenGary confirmed understanding of information.

## 2014-11-21 ENCOUNTER — Ambulatory Visit (INDEPENDENT_AMBULATORY_CARE_PROVIDER_SITE_OTHER): Payer: Medicare Other | Admitting: Interventional Cardiology

## 2014-11-21 ENCOUNTER — Ambulatory Visit: Payer: Medicare Other | Admitting: General Surgery

## 2014-11-21 DIAGNOSIS — I482 Chronic atrial fibrillation, unspecified: Secondary | ICD-10-CM

## 2014-11-21 LAB — PROTIME-INR: INR: 1.4 — AB (ref 0.9–1.1)

## 2014-11-22 ENCOUNTER — Encounter: Payer: Self-pay | Admitting: Family Medicine

## 2014-11-22 ENCOUNTER — Ambulatory Visit (INDEPENDENT_AMBULATORY_CARE_PROVIDER_SITE_OTHER): Payer: Medicare Other | Admitting: Family Medicine

## 2014-11-22 VITALS — BP 130/82 | HR 81 | Temp 97.8°F | Ht 63.0 in | Wt 103.5 lb

## 2014-11-22 DIAGNOSIS — K819 Cholecystitis, unspecified: Secondary | ICD-10-CM

## 2014-11-22 DIAGNOSIS — I2510 Atherosclerotic heart disease of native coronary artery without angina pectoris: Secondary | ICD-10-CM | POA: Diagnosis not present

## 2014-11-22 DIAGNOSIS — I482 Chronic atrial fibrillation, unspecified: Secondary | ICD-10-CM

## 2014-11-22 DIAGNOSIS — I10 Essential (primary) hypertension: Secondary | ICD-10-CM | POA: Diagnosis not present

## 2014-11-22 DIAGNOSIS — H409 Unspecified glaucoma: Secondary | ICD-10-CM | POA: Insufficient documentation

## 2014-11-22 NOTE — Assessment & Plan Note (Signed)
Restarting atenolol for rate control per cardiology.

## 2014-11-22 NOTE — Assessment & Plan Note (Signed)
Cholecystostomy tube in place. Patient appears to be doing well but has significant pain. She has upcoming follow-up with her surgeon to determine further course of action (continue with drain, surgery, pull tube). Advise continue use of her Vicodin with close follow-up.

## 2014-11-22 NOTE — Patient Instructions (Signed)
Continue your current medications. I will discuss the blood pressure/heart medications with Dr. SwazilandJordan.  Follow up with me in the next 1-3 months to see how you're doing.  Take care  Dr. Adriana Simasook

## 2014-11-22 NOTE — Progress Notes (Signed)
Pre visit review using our clinic review tool, if applicable. No additional management support is needed unless otherwise documented below in the visit note. 

## 2014-11-22 NOTE — Assessment & Plan Note (Signed)
Hypertension well controlled today. Upon review of her discharge summary reflects that she should be on her diltiazem as well as atenolol. For some reason these were discontinued. Blood pressure stable and at goal today. I discussed this with her cardiologist who recommended restarting the atenolol and holding the Cardizem (for rate control of A. Fib).

## 2014-11-22 NOTE — Progress Notes (Signed)
Subjective:  Patient ID: Laurie Larsen, female    DOB: May 09, 1923  Age: 81 y.o. MRN: [id number]  CC: Follow up after recent hospitalization  HPI:  81 year old female with a past medical history of atrial fibrillation, diastolic CHF, tachybradycardia syndrome status post pacemaker placement, hypertension, hyperlipidemia presents to clinic today for follow-up.  Patient was recently admitted the hospital on 9/20 with cholecystitis. Given comorbidities, she is not a good candidate for surgery and a cholecystostomy tube was placed.  She was discharged in stable condition to skilled nursing unit in her assisted-living facility.  She has followed up with general surgery on 9/30.  The plan at that time was to follow-up for imaging to determine further course of action.  She presents today for follow-up. She states that she is doing okay but has been expressing severe intermittent pain of the right upper quadrant which radiates to the back. She states that it takes quite a bit of pain medication to relieve her pain. She states that if this continues she does not feel like she could live with this.  She states that her diet is improving although she recently lost weight. She is now on Remeron for appetite stimulation. She states that she has upcoming follow-up with her surgeon on Friday.  Social Hx   Social History   Social History  . Marital Status: Married    Spouse Name: Lorella NimrodHarvey  . Number of Children: 3  . Years of Education: 13   Occupational History  . RETIRED    Social History Main Topics  . Smoking status: Never Smoker   . Smokeless tobacco: Never Used  . Alcohol Use: No  . Drug Use: No  . Sexual Activity: No   Other Topics Concern  . 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   Work: [employer] and then [employer] dept - retired.  Married 1948. 3 sons - '52, '57, '58; 5 grandchildren-one deceased -  OD @ 3921.  Lives alone with husband  and they are independent in ADLs.  End of Life Care: no prolonged heroic measures, i.e. Artificial feeding or hydration; DNR; no prolonged intubation.   Review of Systems  Constitutional: Positive for appetite change. Negative for fever.  Gastrointestinal: Positive for abdominal pain.   Objective:  BP 130/82 mmHg  Pulse 81  Temp(Src) 97.8 F (36.6 C) (Oral)  Ht 5\' 3"  (1.6 m)  Wt 103 lb 8 oz (46.947 kg)  BMI 18.34 kg/m2  SpO2 97%  BP/Weight 11/22/2014 10/27/2014 10/24/2014  Systolic BP 130 107 112  Diastolic BP 82 66 60  Wt. (Lbs) 103.5 100 -  BMI 18.34 17.72 -   Physical Exam  Constitutional:  Frail, thin elderly female in no acute distress.  Cardiovascular: Normal rate.  An irregularly irregular rhythm present.  Pulmonary/Chest: Effort normal.  Bibasilar rales.  Abdominal: Soft. She exhibits no distension.  Cholecystostomy tube in place. Wound is clean, dry, intact. Mild surrounding erythema.  Psychiatric:  Patient tearful today when discussing her pain.  Vitals reviewed.  Lab Results  Component Value Date   WBC 7.1 11/06/2014   HGB 11.7* 11/06/2014   HCT 34.5* 11/06/2014   PLT 248 11/06/2014   GLUCOSE 100* 11/06/2014   CHOL 117 06/01/2014   TRIG 68 06/01/2014   HDL 43 06/01/2014   LDLCALC 60 06/01/2014   ALT 30 11/06/2014   AST 28 11/06/2014   NA 138 11/06/2014   K 3.7 11/06/2014  CL 103 11/06/2014   CREATININE 1.05* 11/06/2014   BUN 45* 11/06/2014   CO2 26 11/06/2014   TSH 2.221 11/03/2014   INR 1.4* 11/21/2014    Assessment & Plan:   Problem List Items Addressed This Visit    ATRIAL FIBRILLATION, CHRONIC    Restarting atenolol for rate control per cardiology.      Cholecystitis - Primary    Cholecystostomy tube in place. Patient appears to be doing well but has significant pain. She has upcoming follow-up with her surgeon to determine further course of action (continue with drain, surgery, pull tube). Advise continue use of her Vicodin with close  follow-up.      Essential hypertension, benign    Hypertension well controlled today. Upon review of her discharge summary reflects that she should be on her diltiazem as well as atenolol. For some reason these were discontinued. Blood pressure stable and at goal today. I discussed this with her cardiologist who recommended restarting the atenolol and holding the Cardizem (for rate control of A. Fib).         Follow-up: 1-3 months.   Everlene Other, DO

## 2014-11-23 ENCOUNTER — Encounter: Payer: Self-pay | Admitting: Surgery

## 2014-11-23 ENCOUNTER — Ambulatory Visit: Payer: Medicare Other | Admitting: General Surgery

## 2014-11-28 LAB — PROTIME-INR: INR: 3 — AB (ref 0.9–1.1)

## 2014-11-29 ENCOUNTER — Encounter: Payer: Self-pay | Admitting: General Surgery

## 2014-11-29 ENCOUNTER — Ambulatory Visit (INDEPENDENT_AMBULATORY_CARE_PROVIDER_SITE_OTHER): Payer: Medicare Other | Admitting: Internal Medicine

## 2014-11-29 ENCOUNTER — Telehealth: Payer: Self-pay | Admitting: *Deleted

## 2014-11-29 ENCOUNTER — Other Ambulatory Visit: Payer: Self-pay | Admitting: General Surgery

## 2014-11-29 ENCOUNTER — Other Ambulatory Visit: Payer: Self-pay | Admitting: *Deleted

## 2014-11-29 ENCOUNTER — Ambulatory Visit (INDEPENDENT_AMBULATORY_CARE_PROVIDER_SITE_OTHER): Payer: Medicare Other | Admitting: General Surgery

## 2014-11-29 VITALS — BP 122/72 | HR 73 | Temp 98.1°F | Ht 63.0 in | Wt 100.0 lb

## 2014-11-29 DIAGNOSIS — K819 Cholecystitis, unspecified: Secondary | ICD-10-CM | POA: Diagnosis not present

## 2014-11-29 DIAGNOSIS — I2510 Atherosclerotic heart disease of native coronary artery without angina pectoris: Secondary | ICD-10-CM | POA: Diagnosis not present

## 2014-11-29 DIAGNOSIS — I482 Chronic atrial fibrillation, unspecified: Secondary | ICD-10-CM

## 2014-11-29 NOTE — Patient Instructions (Signed)
You will be receiving a call from our office informing you of your procedure date and time. If you have any questions, please call our office at your earliest convenience.

## 2014-11-29 NOTE — Progress Notes (Signed)
Outpatient Surgical Follow Up  11/29/2014  Laurie Larsen is an 81 y.o. female.   Chief Complaint  Patient presents with  . Follow-up    HPI: 81 year old female returns for follow-up. She had a bout of acute cholecystitis approximately 6 weeks ago for which she had a percutaneous cholecystostomy tube drain placed. Since that time she has completed outpatient rehabilitation and has finally returned home approximately 1week ago. She does state she continues to have intermittent pain from the drain site. However, her primary complaints of pain or from her chronic pains which were in existence prior to her cholecystitis. She does state that her appetite remains poor and she's had very little oral intake on a daily basis. This is due to a lack of appetite and not due to any pain. She did have pain in her right upper quadrant approximately 5 days ago for which she took pain medication for however none since. She denies any fevers, chills, nausea, vomiting, diarrhea, constipation.  Past Medical History  Diagnosis Date  . Permanent atrial fibrillation (HCC)     a. Chronic Coumadin.  [name]. HTN (hypertension)   . Chronic diastolic CHF (congestive heart failure), NYHA class 1 (HCC)     a. 04/2013 Echo: EF 50-55%, triv MR, mildly dil LA, PASP 48mmHg, mild TR, mild-mod RA dil.  [name]. CAD (coronary artery disease)     a. 2005 Cath: 100% dLCX-->Med managed.  . Sick sinus syndrome (HCC)     a. 05/2008 s/p MDT EnRhythm DC PPM ([facility]).  . Chronic pain     a. ? due to arthritis  . Glaucoma   . Hyperlipidemia   . Diverticulitis large intestine   . Allergy   . Phlebitis     a. after pacemeker placement in 2010.  [name]. Colon polyps   . Hypothyroidism   . Transfusion history     a. After childbirth 60 years ago (1957)  . Rash/skin eruption     a. Multiple episodes - wide distribution-intermittent, possibly drug rash.  . Pulmonary HTN (HCC)     a. PASP 48mmHg on echo 04/2013.  [name]. Chronic Right Pleural Effusion     a. 07/2012 s/p thoracentesis - [facility].  . Arthritis   . Bronchitis   . Hay fever   . Colon polyps   . History of blood transfusion   . Urine incontinence   . Back complaints     Past Surgical History  Procedure Laterality Date  . Appendectomy  1939  . Abdominal hysterectomy  1977  . Tonsillectomy  81 yrs old  . Kidney surgery  1955    Right  . Ankle fracture surgery  1979    Left, pin  . Pacemaker insertion  06/20/08    MDT EnRhythm DR implanted by Dr Reyes IvanKersey  . Breast surgery      Needle guided excision, right breast calcification  . Breast fibroadenoma surgery  2010    Right  . Skin graft      left leg  . Breast biopsy  2011    Family History  Problem Relation Age of Onset  . Cancer Mother     colon with mets  . Diabetes Mother   . Heart disease Father   . Hyperlipidemia Father   . Hypertension Father   . Stroke Brother     brother who died '11  . Cancer Paternal Aunt     breast  . Cancer Cousin  uterine, lung cancer  . Heart attack Brother   . Heart attack Father     Social History:  reports that she has never smoked. She has never used smokeless tobacco. She reports that she does not drink alcohol or use illicit drugs.  Allergies:  Allergies  Allergen Reactions  . Amoxicillin Other (See Comments)    Reaction:  Weakness  . Cetylpyridinium Other (See Comments)    weakness  . Digoxin Other (See Comments)    Reaction:  Weakness and dehydration  . Risedronate Sodium Rash    Medications reviewed.    ROS  A multisystem review of systems was completed. All pertinent positives negatives within the history of present illness remainder negative.  BP 122/72 mmHg  Pulse 73  Temp(Src) 98.1 F (36.7 C) (Oral)  Ht  (1.6 m)  Wt 45.36 kg (100 lb)  BMI 17.72 kg/m2  Physical Exam  Gen.: No acute distress  chest: Clear Heart: Regular rhythm  abdomen: Soft, nontender, nondistended. Cholecystostomy tube In  place in the right upper quadrant draining normal-appearing bile. No evidence of erythema or purulence.   No results found for this or any previous visit (from the past 48 hour(s)). No results found.  Assessment/Plan:  1. Cholecystitis 81 year old female status post treatment of cholecystitis with cholecystostomy drain and antibiotics. Feeling much better than her previous visit with me. Discussed multiple treatment options ranging from surgery for removal her gallbladder drain, contrast study of the drain to confirm opening of the duct prior to pulling the drain, leaving the drain in place. Patient is not interested in surgery at this time she is a poor operative candidate given her multiple medical comorbidities. She also states that she would like the drain removed if possible. Discussed with radiology. We will obtain contrasted study of the tube, drain, tract with possible removal of the drain at that time. She'll follow up in clinic shortly thereafter. - DG CHOLANGIOGRAM  EXISTING TUBE; Future     Ricarda Frame  11/29/2014,negative

## 2014-11-29 NOTE — Telephone Encounter (Signed)
Ester from home health called and reported that Carolan ClinesMargaret Meenach declined the OT eval that she tried to do.

## 2014-11-29 NOTE — Telephone Encounter (Signed)
Called patient's son Jillyn HiddenGary and informed him that the patient's procedure date and time is 12/06/2014 @ [facility]00 pm [facility]. I directed Jillyn HiddenGary to make sure the patient doesn't eat or drink anything after midnight the day before the procedure and too arrive at the medical mall at 1:00 pm. Jillyn HiddenGary confirmed understanding of direction and information.

## 2014-11-30 ENCOUNTER — Telehealth: Payer: Self-pay | Admitting: *Deleted

## 2014-11-30 NOTE — Telephone Encounter (Signed)
FYI:Amy Pope from [facility] stated that, they will not be picking patient up for nurse care, however she will use the physical and occupational therapy.

## 2014-12-05 ENCOUNTER — Ambulatory Visit (INDEPENDENT_AMBULATORY_CARE_PROVIDER_SITE_OTHER): Payer: Medicare Other | Admitting: *Deleted

## 2014-12-05 DIAGNOSIS — I482 Chronic atrial fibrillation, unspecified: Secondary | ICD-10-CM

## 2014-12-05 DIAGNOSIS — Z950 Presence of cardiac pacemaker: Secondary | ICD-10-CM | POA: Diagnosis not present

## 2014-12-05 LAB — CUP PACEART INCLINIC DEVICE CHECK
Battery Voltage: 2.82 V
Brady Statistic RV Percent Paced: 50.38 %
Implantable Lead Implant Date: 20100525
Implantable Lead Implant Date: 20100525
Implantable Lead Model: 5076
Implantable Lead Model: 5076
Lead Channel Impedance Value: 472 Ohm
Lead Channel Setting Sensing Sensitivity: 0.9 mV
MDC IDC LEAD LOCATION: 753859
MDC IDC LEAD LOCATION: 753860
MDC IDC MSMT LEADCHNL RA IMPEDANCE VALUE: 472 Ohm
MDC IDC MSMT LEADCHNL RV IMPEDANCE VALUE: 416 Ohm
MDC IDC SESS DTM: 20161108092447
MDC IDC SET LEADCHNL RV PACING AMPLITUDE: 2.5 V
MDC IDC SET LEADCHNL RV PACING PULSEWIDTH: 0.4 ms

## 2014-12-05 NOTE — Progress Notes (Signed)
Battery check only. Remaining power 2.82V. 100% AT/AF + warfarin. ROV w/ [facility] 01/02/15 for next check. Next billable Feb/1017.

## 2014-12-06 ENCOUNTER — Other Ambulatory Visit: Payer: Self-pay | Admitting: General Surgery

## 2014-12-06 ENCOUNTER — Ambulatory Visit
Admission: RE | Admit: 2014-12-06 | Discharge: 2014-12-06 | Disposition: A | Discharge status: Home / Self Care | Payer: Medicare Other | Source: Ambulatory Visit | Attending: General Surgery | Admitting: General Surgery

## 2014-12-06 DIAGNOSIS — Z934 Other artificial openings of gastrointestinal tract status: Secondary | ICD-10-CM | POA: Diagnosis not present

## 2014-12-06 DIAGNOSIS — K810 Acute cholecystitis: Secondary | ICD-10-CM | POA: Insufficient documentation

## 2014-12-06 DIAGNOSIS — K819 Cholecystitis, unspecified: Secondary | ICD-10-CM

## 2014-12-06 MED ORDER — IOHEXOL 300 MG/ML  SOLN
30.0000 mL | Freq: Once | INTRAMUSCULAR | Status: DC | PRN
  Administered 2014-12-06: 30 mL
  Filled 2014-12-06: qty 30

## 2014-12-07 LAB — PROTIME-INR: INR: 4.9 — AB (ref 0.9–1.1)

## 2014-12-08 ENCOUNTER — Ambulatory Visit (INDEPENDENT_AMBULATORY_CARE_PROVIDER_SITE_OTHER): Payer: Medicare Other | Admitting: Cardiology

## 2014-12-08 ENCOUNTER — Ambulatory Visit (INDEPENDENT_AMBULATORY_CARE_PROVIDER_SITE_OTHER): Payer: Medicare Other | Admitting: Pharmacist Clinician (PhC)/ Clinical Pharmacy Specialist

## 2014-12-08 ENCOUNTER — Encounter: Payer: Self-pay | Admitting: Cardiology

## 2014-12-08 VITALS — BP 122/86 | HR 72 | Ht 63.0 in | Wt 99.0 lb

## 2014-12-08 DIAGNOSIS — Z7901 Long term (current) use of anticoagulants: Secondary | ICD-10-CM

## 2014-12-08 DIAGNOSIS — I482 Chronic atrial fibrillation, unspecified: Secondary | ICD-10-CM

## 2014-12-08 DIAGNOSIS — K819 Cholecystitis, unspecified: Secondary | ICD-10-CM | POA: Diagnosis not present

## 2014-12-08 DIAGNOSIS — Z950 Presence of cardiac pacemaker: Secondary | ICD-10-CM

## 2014-12-08 DIAGNOSIS — I5032 Chronic diastolic (congestive) heart failure: Secondary | ICD-10-CM | POA: Diagnosis not present

## 2014-12-08 DIAGNOSIS — I272 Other secondary pulmonary hypertension: Secondary | ICD-10-CM

## 2014-12-08 DIAGNOSIS — I2510 Atherosclerotic heart disease of native coronary artery without angina pectoris: Secondary | ICD-10-CM

## 2014-12-08 NOTE — Progress Notes (Signed)
CARDIOLOGY OFFICE NOTE  Date:  12/08/2014    Laurie CobbsMargaret N Biello Date of Birth: 1923/09/03 Medical Record #[id number]  PCP:  Wynona DoveWALKER,JENNIFER AZBELL, MD  Cardiologist:  [place]    Chief Complaint  Patient presents with  . Atrial Fibrillation     History of Present Illness: Laurie Larsen is a 81 y.o. female who presents today for  follow up diastolic CHF.  She has a hx of diastolic HF and chronic R pleural effusion, HLD, pulmonary HTN, chronic atrial fib, chronic coumadin therapy, HTN, CAD with known occlusion of a distal LCX branch from 2005, SSS with PPM in place, chronic pain, and hypothyroidism. She had pneumonia  in July of 2014 as well as right pleural effusion requiring thoracentesis - evaluation suggested a transudative process with some reactive mesothelial cells. Echo done in April 2015 showed normal LV and valvular function with moderate pulmonary HTN.   Admitted to [facility] in August 2015 with exacerbation of diastolic HF. She currently lives at [facility].   Seen in February with increased CHF findings and diuresed as outpatient. Admitted 05/31/14-06/04/14 with acute respiratory failure with hypoxemia and CHF. She was diuresed with excellent result.   Recently she was [facility] with acute acalculous cholecystitis. She presented with abdominal pain and fever. A percutaneous drain was placed and she was treated with antibiotics with resolution of symptoms. She lost 10 lbs but has since gained it back. On follow up today she is seen with her husband. She is trying to limit her salt intake. She is taking lasix 60 mg daily. She knows if she eats something saltier she takes 80 mg . Now breathing is doing OK. No increased swelling and weight is stable. She is approaching elective revision of pacemaker.    Past Medical History  Diagnosis Date  . Permanent atrial fibrillation (HCC)     a. Chronic Coumadin.  [name]. HTN (hypertension)   . Chronic diastolic  CHF (congestive heart failure), NYHA class 1 (HCC)     a. 04/2013 Echo: EF 50-55%, triv MR, mildly dil LA, PASP 48mmHg, mild TR, mild-mod RA dil.  [name]. CAD (coronary artery disease)     a. 2005 Cath: 100% dLCX-->Med managed.  . Sick sinus syndrome (HCC)     a. 05/2008 s/p MDT EnRhythm DC PPM ([facility]).  . Chronic pain     a. ? due to arthritis  . Glaucoma   . Hyperlipidemia   . Diverticulitis large intestine   . Allergy   . Phlebitis     a. after pacemeker placement in 2010.  [name]. Colon polyps   . Hypothyroidism   . Transfusion history     a. After childbirth 60 years ago (1957)  . Rash/skin eruption     a. Multiple episodes - wide distribution-intermittent, possibly drug rash.  . Pulmonary HTN (HCC)     a. PASP 48mmHg on echo 04/2013.  [name]. Chronic Right Pleural Effusion     a. 07/2012 s/p thoracentesis - Followed by Dr. Delford FieldWright [facility].  . Arthritis   . Bronchitis   . Hay fever   . Colon polyps   . History of blood transfusion   . Urine incontinence   . Back complaints   . Acalculous cholecystitis     Past Surgical History  Procedure Laterality Date  . Appendectomy  1939  . Abdominal hysterectomy  1977  . Tonsillectomy  81 yrs old  . Kidney surgery  1955    Right  .  Ankle fracture surgery  1979    Left, pin  . Pacemaker insertion  06/20/08    MDT EnRhythm DR implanted by Dr Reyes Ivan  . Breast surgery      Needle guided excision, right breast calcification  . Breast fibroadenoma surgery  2010    Right  . Skin graft      left leg  . Breast biopsy  2011     Medications: Current Outpatient Prescriptions  Medication Sig Dispense Refill  . acetaminophen (TYLENOL) 650 MG CR tablet Take 1,300 mg by mouth at bedtime as needed for pain.    [name] atenolol (TENORMIN) 25 MG tablet Take 1 tablet (25 mg total) by mouth 2 (two) times daily. 180 tablet 3  . atorvastatin (LIPITOR) 40 MG tablet Take 0.5 tablets (20 mg total) by mouth at bedtime. 45 tablet 3  . diltiazem (CARDIZEM  CD) 180 MG 24 hr capsule Take 1 capsule (180 mg total) by mouth daily. 90 capsule 1  . dorzolamide-timolol (COSOPT) 22.3-6.8 MG/ML ophthalmic solution Place 1 drop into both eyes 2 (two) times daily.     . furosemide (LASIX) 40 MG tablet Take 60 mg by mouth daily.  60 tablet 6  . HYDROcodone-acetaminophen (NORCO) 10-325 MG tablet Take 1 tablet by mouth every 4 (four) hours as needed for moderate pain. 30 tablet 0  . levothyroxine (SYNTHROID, LEVOTHROID) 112 MCG tablet Take 1 tablet (112 mcg total) by mouth daily. 90 tablet 1  . polyethylene glycol (MIRALAX / GLYCOLAX) packet Take 17 g by mouth at bedtime.     . potassium chloride (K-DUR) 10 MEQ tablet Take 10 mEq by mouth 2 (two) times daily.    . traMADol (ULTRAM) 50 MG tablet Take 1 tablet by mouth 2 (two) times daily.   0  . Travoprost, BAK Free, (TRAVATAN) 0.004 % SOLN ophthalmic solution Place 1 drop into both eyes at bedtime.    . Vitamin D, Ergocalciferol, (DRISDOL) 50000 UNITS CAPS capsule Take 50,000 Units by mouth every 7 (seven) days. Pt takes on Saturday.    . warfarin (COUMADIN) 5 MG tablet TAKE AS DIRECTED BY COUMADIN CLINIC (Patient taking differently: Take 2.5-5 mg by mouth every evening. Pt takes 2.5mg  on Saturday and  all other days.) 90 tablet 1   No current facility-administered medications for this visit.    Allergies: Allergies  Allergen Reactions  . Amoxicillin Other (See Comments)    Reaction:  Weakness  . Cetylpyridinium Other (See Comments)    weakness  . Digoxin Other (See Comments)    Reaction:  Weakness and dehydration  . Risedronate Sodium Rash    Social History: The patient  reports that she has never smoked. She has never used smokeless tobacco. She reports that she does not drink alcohol or use illicit drugs.   Family History: The patient's family history includes Cancer in her cousin, mother, and paternal aunt; Diabetes in her mother; Heart attack in her brother and father; Heart disease in her  father; Hyperlipidemia in her father; Hypertension in her father; Stroke in her brother.   Review of Systems: Please see the history of present illness.      All other systems are reviewed and negative.   Physical Exam: VS:  BP 122/86 mmHg  Pulse 72  Ht  (1.6 m)  Wt 44.906 kg (99 lb)  BMI 17.54 kg/m2 .  BMI Body mass index is 17.54 kg/(m^2).  Wt Readings from Last 3 Encounters:  12/08/14 44.906 kg (99 lb)  11/29/14  45.36 kg (100 lb)  11/22/14 46.947 kg (103 lb 8 oz)    General: Pleasant. Chronically ill. Quite kyphotic but in no acute distress.  HEENT: Normal. Neck: Supple, no JVD, carotid bruits, or masses noted.  Cardiac: Irregular irregular rhythm. Rate is ok. No murmurs, rubs, or gallops. No edema.  Respiratory:  Lungs are clear to auscultation bilaterally with normal work of breathing.  GI: Soft and nontender.  MS: No deformity or atrophy. Gait and ROM intact.Using a cane.  Skin: Warm and dry. Color is normal.  Neuro:  Strength and sensation are intact and no gross focal deficits noted.  Psych: Alert, appropriate and with normal affect.   LABORATORY DATA:  EKG:  EKG is not ordered today.   Lab Results  Component Value Date   WBC 7.1 11/06/2014   HGB 11.7* 11/06/2014   HCT 34.5* 11/06/2014   PLT 248 11/06/2014   GLUCOSE 100* 11/06/2014   CHOL 117 06/01/2014   TRIG 68 06/01/2014   HDL 43 06/01/2014   LDLCALC 60 06/01/2014   ALT 30 11/06/2014   AST 28 11/06/2014   NA 138 11/06/2014   K 3.7 11/06/2014   CL 103 11/06/2014   CREATININE 1.05* 11/06/2014   BUN 45* 11/06/2014   CO2 26 11/06/2014   TSH 2.221 11/03/2014   INR 4.9* 12/07/2014    BNP (last 3 results)  Recent Labs  05/31/14 1255  BNP 1124.0*    ProBNP (last 3 results)  Recent Labs  03/10/14 1014 03/21/14 1500  PROBNP 925.0* 844.0*     Other Studies Reviewed Today:  INR of 4.9  Assessment/Plan: 1. Chronic diastolic HF - weight and symptoms are stable on lasix 60 mg daily.  She understands to take extra lasix if weight increases or she has increased dyspnea. stressed importance of taking her medications daily and avoiding salt. Will follow up in 3 months.   2. Chronic atrial fibrillation: Rate controlled. Continue coumadin which she is tolerating. Pharmacy to adjust coumadin today since she is supratherapeutic.   3. Pulmonary HTN  4. Essential hypertension, benign: BP controlled on current regimen which includes Atenolol, Diltiazem.   5. 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 May need pacer revision soon. Explained what this would entail.   6. CAD - continue with medical management  7. HLD (hyperlipidemia): on statin therapy  8. Acalculous cholecystitis. Symptoms resolved. Drain still in place but anticipate it will be removed soon.  Current medicines are reviewed with the patient today.  The patient does not have concerns regarding medicines other than what has been noted above.  The following changes have been made:  See above.  Labs/ tests ordered today include:    No orders of the defined types were placed in this encounter.    Disposition:   FU with Dr. Swaz[place]and 3 months  Patient is agreeable to this plan and will call if any problems develop in the interim.   Signed: Peter Swaziland MD, [facility]    12/08/2014 11:17 AM

## 2014-12-08 NOTE — Patient Instructions (Signed)
I will see you in 3 months

## 2014-12-12 ENCOUNTER — Other Ambulatory Visit: Payer: Self-pay | Admitting: Cardiology

## 2014-12-14 ENCOUNTER — Ambulatory Visit (INDEPENDENT_AMBULATORY_CARE_PROVIDER_SITE_OTHER): Payer: Medicare Other | Admitting: Cardiovascular Disease

## 2014-12-14 DIAGNOSIS — H04123 Dry eye syndrome of bilateral lacrimal glands: Secondary | ICD-10-CM | POA: Insufficient documentation

## 2014-12-14 DIAGNOSIS — H43813 Vitreous degeneration, bilateral: Secondary | ICD-10-CM | POA: Insufficient documentation

## 2014-12-14 DIAGNOSIS — I482 Chronic atrial fibrillation, unspecified: Secondary | ICD-10-CM

## 2014-12-14 LAB — PROTIME-INR: INR: 1.6 — AB (ref 0.9–1.1)

## 2014-12-20 ENCOUNTER — Ambulatory Visit
Admission: RE | Admit: 2014-12-20 | Discharge: 2014-12-20 | Disposition: A | Discharge status: Home / Self Care | Payer: Medicare Other | Source: Ambulatory Visit | Attending: General Surgery | Admitting: General Surgery

## 2014-12-20 DIAGNOSIS — K819 Cholecystitis, unspecified: Secondary | ICD-10-CM

## 2014-12-20 MED ORDER — IOHEXOL 300 MG/ML  SOLN
50.0000 mL | Freq: Once | INTRAMUSCULAR | Status: DC | PRN
  Administered 2014-12-20: 50 mL
  Filled 2014-12-20: qty 50

## 2014-12-20 NOTE — Procedures (Signed)
Appropriately positioned and functioning cholecystostomy tube. No exchange performed. Percutaneous cholangiogram is negative for the presence of cholelithiasis or choledocholithiasis.   Plan: - Despite no evidence of cholelithiasis or choledocholithiasis, the patient wishes to pursue an additional 2 week trial of internalization prior to potential cholecystostomy tube removal secondary to fear of developing recurrent symptoms necessitating either a cholecystectomy or replacement of the cholecystostomy tube.   - As such, the patient return to the interventional radiology department in 2 weeks for repeat fluoroscopic guided cholangiogram an additional discussion regarding potential cholecystostomy tube removal.   - The patient was instructed to reconnect her cholecystostomy tube to a gravity bag if she were to develop recurrent right upper quadrant abdominal pain.   Katherina RightJay Tewana Bohlen, MD Pager #: [phone]

## 2014-12-25 ENCOUNTER — Other Ambulatory Visit: Payer: Self-pay | Admitting: General Surgery

## 2014-12-25 DIAGNOSIS — K819 Cholecystitis, unspecified: Secondary | ICD-10-CM

## 2015-01-02 ENCOUNTER — Ambulatory Visit (INDEPENDENT_AMBULATORY_CARE_PROVIDER_SITE_OTHER): Payer: Medicare Other | Admitting: *Deleted

## 2015-01-02 DIAGNOSIS — Z950 Presence of cardiac pacemaker: Secondary | ICD-10-CM

## 2015-01-02 DIAGNOSIS — Z45010 Encounter for checking and testing of cardiac pacemaker pulse generator [battery]: Secondary | ICD-10-CM

## 2015-01-02 LAB — CUP PACEART INCLINIC DEVICE CHECK
Brady Statistic RV Percent Paced: 51.77 %
Date Time Interrogation Session: 20161206154406
Implantable Lead Implant Date: 20100525
Implantable Lead Model: 5076
Lead Channel Impedance Value: 440 Ohm
Lead Channel Setting Pacing Pulse Width: 0.4 ms
Lead Channel Setting Sensing Sensitivity: 0.9 mV
MDC IDC LEAD IMPLANT DT: 20100525
MDC IDC LEAD LOCATION: 753859
MDC IDC LEAD LOCATION: 753860
MDC IDC MSMT BATTERY VOLTAGE: 2.82 V
MDC IDC MSMT LEADCHNL RV IMPEDANCE VALUE: 400 Ohm
MDC IDC SET LEADCHNL RV PACING AMPLITUDE: 2.5 V

## 2015-01-02 LAB — PROTIME-INR: INR: 1.9 — AB (ref 0.9–1.1)

## 2015-01-02 NOTE — Progress Notes (Signed)
Battery check only. Remaining power 2.82V, ERI at 2.81V. 100% AT/AF + warfarin. ROV w/ [facility] on 02/06/15 at 9:30am for next check. Next billable 02/2015.

## 2015-01-03 ENCOUNTER — Ambulatory Visit (INDEPENDENT_AMBULATORY_CARE_PROVIDER_SITE_OTHER): Payer: Medicare Other | Admitting: Internal Medicine

## 2015-01-03 ENCOUNTER — Ambulatory Visit
Admission: RE | Admit: 2015-01-03 | Discharge: 2015-01-03 | Disposition: A | Discharge status: Home / Self Care | Payer: Medicare Other | Source: Ambulatory Visit | Attending: General Surgery | Admitting: General Surgery

## 2015-01-03 DIAGNOSIS — I10 Essential (primary) hypertension: Secondary | ICD-10-CM | POA: Diagnosis not present

## 2015-01-03 DIAGNOSIS — Z90710 Acquired absence of both cervix and uterus: Secondary | ICD-10-CM | POA: Insufficient documentation

## 2015-01-03 DIAGNOSIS — Z8489 Family history of other specified conditions: Secondary | ICD-10-CM | POA: Diagnosis not present

## 2015-01-03 DIAGNOSIS — J90 Pleural effusion, not elsewhere classified: Secondary | ICD-10-CM | POA: Insufficient documentation

## 2015-01-03 DIAGNOSIS — I482 Chronic atrial fibrillation, unspecified: Secondary | ICD-10-CM

## 2015-01-03 DIAGNOSIS — Z86010 Personal history of colonic polyps: Secondary | ICD-10-CM | POA: Insufficient documentation

## 2015-01-03 DIAGNOSIS — I272 Other secondary pulmonary hypertension: Secondary | ICD-10-CM | POA: Insufficient documentation

## 2015-01-03 DIAGNOSIS — R109 Unspecified abdominal pain: Secondary | ICD-10-CM | POA: Insufficient documentation

## 2015-01-03 DIAGNOSIS — Z881 Allergy status to other antibiotic agents status: Secondary | ICD-10-CM | POA: Diagnosis not present

## 2015-01-03 DIAGNOSIS — K819 Cholecystitis, unspecified: Secondary | ICD-10-CM

## 2015-01-03 DIAGNOSIS — Z888 Allergy status to other drugs, medicaments and biological substances status: Secondary | ICD-10-CM | POA: Diagnosis not present

## 2015-01-03 DIAGNOSIS — Z8049 Family history of malignant neoplasm of other genital organs: Secondary | ICD-10-CM | POA: Diagnosis not present

## 2015-01-03 DIAGNOSIS — K5732 Diverticulitis of large intestine without perforation or abscess without bleeding: Secondary | ICD-10-CM | POA: Insufficient documentation

## 2015-01-03 DIAGNOSIS — I2510 Atherosclerotic heart disease of native coronary artery without angina pectoris: Secondary | ICD-10-CM | POA: Diagnosis not present

## 2015-01-03 DIAGNOSIS — T508X5A Adverse effect of diagnostic agents, initial encounter: Secondary | ICD-10-CM | POA: Diagnosis not present

## 2015-01-03 DIAGNOSIS — Z823 Family history of stroke: Secondary | ICD-10-CM | POA: Diagnosis not present

## 2015-01-03 DIAGNOSIS — I495 Sick sinus syndrome: Secondary | ICD-10-CM | POA: Insufficient documentation

## 2015-01-03 DIAGNOSIS — I509 Heart failure, unspecified: Secondary | ICD-10-CM | POA: Insufficient documentation

## 2015-01-03 DIAGNOSIS — E039 Hypothyroidism, unspecified: Secondary | ICD-10-CM | POA: Insufficient documentation

## 2015-01-03 DIAGNOSIS — Z833 Family history of diabetes mellitus: Secondary | ICD-10-CM | POA: Insufficient documentation

## 2015-01-03 DIAGNOSIS — E785 Hyperlipidemia, unspecified: Secondary | ICD-10-CM | POA: Diagnosis not present

## 2015-01-03 DIAGNOSIS — H409 Unspecified glaucoma: Secondary | ICD-10-CM | POA: Insufficient documentation

## 2015-01-03 DIAGNOSIS — Z950 Presence of cardiac pacemaker: Secondary | ICD-10-CM | POA: Diagnosis not present

## 2015-01-03 DIAGNOSIS — Z836 Family history of other diseases of the respiratory system: Secondary | ICD-10-CM | POA: Diagnosis not present

## 2015-01-03 DIAGNOSIS — Z803 Family history of malignant neoplasm of breast: Secondary | ICD-10-CM | POA: Diagnosis not present

## 2015-01-03 MED ORDER — IOHEXOL 300 MG/ML  SOLN
INTRAMUSCULAR | Status: DC | PRN
  Administered 2015-01-03: 15 mL via INTRA_ARTERIAL

## 2015-01-03 MED ORDER — LIDOCAINE HCL (PF) 1 % IJ SOLN
INTRAMUSCULAR | Status: AC
  Filled 2015-01-03: qty 30

## 2015-01-03 NOTE — Sedation Documentation (Signed)
Cholangiogram performed.

## 2015-01-03 NOTE — OR Nursing (Signed)
Jacki ConesLaurie nurse at [facility] given report and dressing change instruction, wash site and change dressing daily. Pt to return in 4 weeks, Jan 6th arrival 10 am

## 2015-01-03 NOTE — Sedation Documentation (Signed)
10.2 Multipurpose Drain changed

## 2015-01-04 ENCOUNTER — Other Ambulatory Visit: Payer: Self-pay | Admitting: General Surgery

## 2015-01-04 DIAGNOSIS — K819 Cholecystitis, unspecified: Secondary | ICD-10-CM

## 2015-01-09 ENCOUNTER — Ambulatory Visit: Payer: Medicare Other | Admitting: Internal Medicine

## 2015-01-11 ENCOUNTER — Ambulatory Visit (INDEPENDENT_AMBULATORY_CARE_PROVIDER_SITE_OTHER): Payer: Medicare Other | Admitting: Internal Medicine

## 2015-01-11 ENCOUNTER — Encounter: Payer: Self-pay | Admitting: Internal Medicine

## 2015-01-11 VITALS — BP 128/76 | HR 87 | Ht 63.0 in | Wt 103.0 lb

## 2015-01-11 DIAGNOSIS — I5032 Chronic diastolic (congestive) heart failure: Secondary | ICD-10-CM

## 2015-01-11 DIAGNOSIS — I2510 Atherosclerotic heart disease of native coronary artery without angina pectoris: Secondary | ICD-10-CM | POA: Diagnosis not present

## 2015-01-11 DIAGNOSIS — I272 Other secondary pulmonary hypertension: Secondary | ICD-10-CM

## 2015-01-11 DIAGNOSIS — G4734 Idiopathic sleep related nonobstructive alveolar hypoventilation: Secondary | ICD-10-CM | POA: Diagnosis not present

## 2015-01-11 DIAGNOSIS — J90 Pleural effusion, not elsewhere classified: Secondary | ICD-10-CM | POA: Diagnosis not present

## 2015-01-11 NOTE — Patient Instructions (Signed)
--  Continue oxygen at 2L at night.

## 2015-01-11 NOTE — Progress Notes (Signed)
[facility]     Assessment and Plan:  Dyspnea.  --Multifactorial due to debility/deconditioning, age-related emphysematous changes, and CHF.  --Appears to be doing well at this time.  --Continue oxygen 2L qhs.   Pleural effusion due to congestive heart failure.  --Stable.   Emphysema.  -Seen on chest x-ray, suspect these are age-related changes.  Cholesystitis.  --Has chole tube in place.   DO NOT RESUSCITATE. -Patient reaffirms the presence of her husband that she would not want to be resuscitated should her heart stop, she would not want to be placed on life support.  Date: 01/11/2015  MRN# [id number] Laurie CobbsMargaret N Larsen 06-02-1923   Laurie CobbsMargaret N Roethler is a 81 y.o. old female seen in follow up for chief complaint of  Chief Complaint  Patient presents with  . PULMONARY CONSULT    fprmer PW pt. seen for pulm. HTN. pt. states breathing is baseline. occ. SOB. occ. dry cough. denies wheezing or chest pain/tightness. on 2L 02 @bedtime .     HPI:  The patient is an 81 year old female, she was receiving [facility]. She has a history of tonic right-sided pleural effusion status post thoracentesis in July 2014, atrial fibrillation, diastolic heart failure, sick sinus syndrome status post pacemaker.  She feels that her breathing has been feeling pretty good she denies trouble over the past few weeks, other than one episode when she woke in the middle of the night with dyspnea, that went away on its own.   Review of testing: -Chest x-ray 06/03/2014: Severe hyperinflation, minimal right-sided pleural effusion, significantly improved from previous film. -PASP 48mmHg on echo 04/2013. Overnight oximetry performed in May 2016: Continues to require 2 L daily at bedtime. -Overnight oximetry performed on room air on 06/30/2014: Time less than or equal to 88% was 7 hours 42 minutes. Average oxygen sat was 88.7%   Medication:     Outpatient Encounter Prescriptions as of 01/11/2015  Medication Sig  . acetaminophen (TYLENOL) 650 MG CR tablet Take 1,300 mg by mouth at bedtime as needed for pain.  [name]. atenolol (TENORMIN) 25 MG tablet Take 1 tablet (25 mg total) by mouth 2 (two) times daily.  [name]. atorvastatin (LIPITOR) 40 MG tablet Take 0.5 tablets (20 mg total) by mouth at bedtime.  [name]. diltiazem (CARDIZEM CD) 180 MG 24 hr capsule TAKE ONE CAPSULE DAILY  . dorzolamide-timolol (COSOPT) 22.3-6.8 MG/ML ophthalmic solution Place 1 drop into both eyes 2 (two) times daily.   . furosemide (LASIX) 40 MG tablet Take 60 mg by mouth daily.   [name]. HYDROcodone-acetaminophen (NORCO) 10-325 MG tablet Take 1 tablet by mouth every 4 (four) hours as needed for moderate pain.  [name]. levothyroxine (SYNTHROID, LEVOTHROID) 112 MCG tablet Take 1 tablet (112 mcg total) by mouth daily.  . polyethylene glycol (MIRALAX / GLYCOLAX) packet Take 17 g by mouth at bedtime.   . potassium chloride (K-DUR) 10 MEQ tablet Take 10 mEq by mouth 2 (two) times daily.  . traMADol (ULTRAM) 50 MG tablet Take 1 tablet by mouth 2 (two) times daily.   . Travoprost, BAK Free, (TRAVATAN) 0.004 % SOLN ophthalmic solution Place 1 drop into both eyes at bedtime.  . Vitamin D, Ergocalciferol, (DRISDOL) 50000 UNITS CAPS capsule Take 50,000 Units by mouth every 7 (seven) days. Pt takes on Saturday.  . warfarin (COUMADIN) 5 MG tablet TAKE AS DIRECTED BY COUMADIN CLINIC (Patient taking differently: Take 2.5-5 mg by mouth every evening. Pt takes 2.5mg   on Saturday and  all other days.)   No facility-administered encounter medications on file as of 01/11/2015.     Allergies:  Amoxicillin; Cetylpyridinium; Digoxin; and Risedronate sodium  Review of Systems: Gen:  Denies  fever, sweats. HEENT: Denies blurred vision. Cvc:  No dizziness, chest pain or heaviness Resp:   Denies cough or sputum porduction. Gi: Denies swallowing difficulty, stomach pain. constipation, bowel incontinence Gu:   Denies bladder incontinence, burning urine Ext:   No Joint pain, stiffness. Skin: No skin rash, easy bruising. Endoc:  No polyuria, polydipsia. Psych: No depression, insomnia. Other:  All other systems were reviewed and found to be negative other than what is mentioned in the HPI.   Physical Examination:   VS: BP 128/76 mmHg  Pulse 87  Ht  (1.6 m)  Wt 103 lb (46.72 kg)  BMI 18.25 kg/m2  SpO2 90%  General Appearance: No distress  Neuro:without focal findings,  speech normal,  HEENT: PERRLA, EOM intact. Pulmonary: normal breath sounds, No wheezing.  Changes of kyphoscoliosis.  CardiovascularNormal S1,S2.  No m/r/g.   Abdomen: Benign, Soft, non-tender. RUQ chole tube in place.  Renal:  No costovertebral tenderness  GU:  Not performed at this time. Endoc: No evident thyromegaly, no signs of acromegaly. Skin:   warm, no rash. Extremities: normal, no cyanosis, clubbing.   LABORATORY PANEL:   CBC No results for input(s): WBC, HGB, HCT, PLT in the last 168 hours. ------------------------------------------------------------------------------------------------------------------  Chemistries  No results for input(s): NA, K, CL, CO2, GLUCOSE, BUN, CREATININE, CALCIUM, MG, AST, ALT, ALKPHOS, BILITOT in the last 168 hours.  Invalid input(s): GFRCGP ------------------------------------------------------------------------------------------------------------------  Cardiac Enzymes No results for input(s): TROPONINI in the last 168 hours. ------------------------------------------------------------  RADIOLOGY:   No results found for this or any previous visit. Results for orders placed during the hospital encounter of 05/31/14  DG Chest 2 View   Narrative CLINICAL DATA:  Shortness breath, pleural effusion  EXAM: CHEST - 2 VIEW  COMPARISON:  05/31/2014  FINDINGS: Improved aeration. Small residual right pleural effusion. Minimal residual subsegmental atelectasis or scarring  in the lower lobes. Stable cardiomegaly. Atheromatous aorta. Stable left subclavian transvenous pacemaker. Mild wedge compression deformity in the lower thoracic spine, stable since 09/15/2013.  IMPRESSION: 1. Improved aeration since previous portable exam, with a small residual right pleural effusion.   Electronically Signed   By: Corlis Leak M.D.   On: 06/03/2014 08:06    ------------------------------------------------------------------------------------------------------------------  Thank  you for allowing [facility] to assist in the care of your patient. Our recommendations are noted above.  Please contact us if we can be of further service.   Wells Guiles, MD.  [facility] Office Number: [phone]  Santiago Glad, M.D.  Stephanie Acre, M.D.  Billy Fischer, M.D

## 2015-01-16 LAB — PROTIME-INR: INR: 1.6 — AB (ref <=1.1)

## 2015-01-17 ENCOUNTER — Ambulatory Visit (INDEPENDENT_AMBULATORY_CARE_PROVIDER_SITE_OTHER): Payer: Medicare Other | Admitting: Internal Medicine

## 2015-01-17 DIAGNOSIS — I482 Chronic atrial fibrillation, unspecified: Secondary | ICD-10-CM

## 2015-01-22 ENCOUNTER — Emergency Department: Payer: Medicare Other

## 2015-01-22 ENCOUNTER — Emergency Department
Admission: EM | Admit: 2015-01-22 | Discharge: 2015-01-22 | Disposition: A | Discharge status: Home / Self Care | Payer: Medicare Other | Attending: Emergency Medicine | Admitting: Emergency Medicine

## 2015-01-22 ENCOUNTER — Encounter: Payer: Self-pay | Admitting: Emergency Medicine

## 2015-01-22 DIAGNOSIS — R0600 Dyspnea, unspecified: Secondary | ICD-10-CM | POA: Diagnosis not present

## 2015-01-22 DIAGNOSIS — Z79899 Other long term (current) drug therapy: Secondary | ICD-10-CM | POA: Insufficient documentation

## 2015-01-22 DIAGNOSIS — R0602 Shortness of breath: Secondary | ICD-10-CM | POA: Diagnosis present

## 2015-01-22 DIAGNOSIS — Z880 Allergy status to penicillin: Secondary | ICD-10-CM | POA: Insufficient documentation

## 2015-01-22 DIAGNOSIS — T85598S Other mechanical complication of other gastrointestinal prosthetic devices, implants and grafts, sequela: Secondary | ICD-10-CM | POA: Diagnosis not present

## 2015-01-22 DIAGNOSIS — Z9981 Dependence on supplemental oxygen: Secondary | ICD-10-CM | POA: Insufficient documentation

## 2015-01-22 DIAGNOSIS — I10 Essential (primary) hypertension: Secondary | ICD-10-CM | POA: Insufficient documentation

## 2015-01-22 DIAGNOSIS — Z7901 Long term (current) use of anticoagulants: Secondary | ICD-10-CM | POA: Diagnosis not present

## 2015-01-22 DIAGNOSIS — R1011 Right upper quadrant pain: Secondary | ICD-10-CM | POA: Diagnosis not present

## 2015-01-22 DIAGNOSIS — T85518A Breakdown (mechanical) of other gastrointestinal prosthetic devices, implants and grafts, initial encounter: Secondary | ICD-10-CM | POA: Insufficient documentation

## 2015-01-22 LAB — CBC
HEMATOCRIT: 35.2 % (ref 35.0–47.0)
Hemoglobin: 11.9 g/dL — ABNORMAL LOW (ref 12.0–16.0)
MCH: 31 pg (ref 26.0–34.0)
MCHC: 33.8 g/dL (ref 32.0–36.0)
MCV: 91.9 fL (ref 80.0–100.0)
Platelets: 201 10*3/uL (ref 150–440)
RBC: 3.83 MIL/uL (ref 3.80–5.20)
RDW: 15.6 % — AB (ref 11.5–14.5)
WBC: 7.8 10*3/uL (ref 3.6–11.0)

## 2015-01-22 LAB — PROTIME-INR
INR: 2.74
Prothrombin Time: 28.6 seconds — ABNORMAL HIGH (ref 11.4–15.0)

## 2015-01-22 LAB — BASIC METABOLIC PANEL
Anion gap: 6 (ref 5–15)
BUN: 23 mg/dL — AB (ref 6–20)
CHLORIDE: 107 mmol/L (ref 101–111)
CO2: 24 mmol/L (ref 22–32)
Calcium: 8.7 mg/dL — ABNORMAL LOW (ref 8.9–10.3)
Creatinine, Ser: 0.61 mg/dL (ref 0.44–1.00)
GFR calc Af Amer: >60 mL/min (ref >60)
GFR calc non Af Amer: >60 mL/min (ref >60)
GLUCOSE: 105 mg/dL — AB (ref 65–99)
POTASSIUM: 4 mmol/L (ref 3.5–5.1)
Sodium: 137 mmol/L (ref 135–145)

## 2015-01-22 LAB — URINALYSIS COMPLETE WITH MICROSCOPIC (ARMC ONLY)
BILIRUBIN URINE: NEGATIVE
Bacteria, UA: NONE SEEN
GLUCOSE, UA: NEGATIVE mg/dL
HGB URINE DIPSTICK: NEGATIVE
KETONES UR: NEGATIVE mg/dL
LEUKOCYTES UA: NEGATIVE
Nitrite: NEGATIVE
PH: 7 (ref 5.0–8.0)
Protein, ur: NEGATIVE mg/dL
Specific Gravity, Urine: 1.015 (ref 1.005–1.030)
Squamous Epithelial / LPF: NONE SEEN

## 2015-01-22 LAB — LIPASE, BLOOD: Lipase: 26 U/L (ref 11–51)

## 2015-01-22 LAB — HEPATIC FUNCTION PANEL
ALBUMIN: 4 g/dL (ref 3.5–5.0)
ALK PHOS: 90 U/L (ref 38–126)
ALT: 21 U/L (ref 14–54)
AST: 28 U/L (ref 15–41)
BILIRUBIN TOTAL: 0.7 mg/dL (ref 0.3–1.2)
Bilirubin, Direct: 0.1 mg/dL (ref 0.1–0.5)
Indirect Bilirubin: 0.6 mg/dL (ref 0.3–0.9)
TOTAL PROTEIN: 6.8 g/dL (ref 6.5–8.1)

## 2015-01-22 LAB — TROPONIN I

## 2015-01-22 LAB — BRAIN NATRIURETIC PEPTIDE: B Natriuretic Peptide: 1232 pg/mL — ABNORMAL HIGH (ref 0.0–100.0)

## 2015-01-22 LAB — APTT: APTT: 32 s (ref 24–36)

## 2015-01-22 MED ORDER — FUROSEMIDE 40 MG PO TABS
80.0000 mg | ORAL_TABLET | Freq: Once | ORAL | Status: AC
  Administered 2015-01-22: 80 mg via ORAL
  Filled 2015-01-22: qty 2

## 2015-01-22 MED ORDER — ALBUTEROL SULFATE (2.5 MG/3ML) 0.083% IN NEBU
5.0000 mg | INHALATION_SOLUTION | Freq: Once | RESPIRATORY_TRACT | Status: AC
  Administered 2015-01-22: 5 mg via RESPIRATORY_TRACT
  Filled 2015-01-22: qty 6

## 2015-01-22 NOTE — ED Notes (Signed)
Patient presents to the ED with shortness of breath and pain around "tubes".  Patient had surgery to her galbladder in September and states that she has had drainage tubes in the right side of her abdomen and for the past several weeks she has had pain around this area.  Patient lives at [facility] and nurse there took patient's temp this am and it was 99.9 degrees.  Patient states she wears oxygen at night but this am after getting up she had to go back to bed and use more oxygen which is unusual for her.

## 2015-01-22 NOTE — ED Provider Notes (Addendum)
[facility] Emergency Department Provider Note  ____________________________________________   I have reviewed the triage vital signs and the nursing notes.   HISTORY  Chief Complaint Post-op Problem and Shortness of Breath    HPI Laurie Larsen is a 81 y.o. female who is DNR/DNI per notes presents today complaining of multiple different things. Patient has a history of a percutaneous chole tube which has been there for several months. She has pain around this area all the time which comes and goes. Sometimes worse with food. She is very vague about whether this pain is worse but since there was a low-grade temperature noted by her home health nurse she came in for evaluation of the tube it appears. She has not had any vomiting or jaundice. The patient states she has had pain off and on for a long time in that area. She is a very poor historian, is very difficult to know whether or not there is any significant change in that last year she has had pain in this area she starts by describing her Thanksgiving meal for example. It does appear that she has nearly daily pain in that area which is worse with food. It is not clear whether it is worse today. Her home health did notice a low-grade temperature and sent her in to make sure there was no complication there. In addition, patient is supposed to be taking Lasix, 60 mg one day and 80 the next, and she has a history of noncompliance with it. She has not taken it in at least 2 days because she was going out and when she goes out she does not wish to have to urinate. It was noted that she was having slightly lower oxygen saturations at home. Patient is on 2 L at night and during the day she is on room air with unexpected oxygen saturation according to her pulmonologist the low 90s which is where she has upon arrival here. She denies any fever chills or nausea or vomiting. She states she might feel a little bit more short of breath  and normal but then she states she is always short of breath so this very difficult to tell. She has no significant complaint at this moment. She specifically denies any chest pain fever cough for active cough nausea vomiting or leg swelling.  Past Medical History  Diagnosis Date  . Permanent atrial fibrillation (HCC)     a. Chronic Coumadin.  [name] HTN (hypertension)   . Chronic diastolic CHF (congestive heart failure), NYHA class 1 (HCC)     a. 04/2013 Echo: EF 50-55%, triv MR, mildly dil LA, PASP , mild TR, mild-mod RA dil.  [name] CAD (coronary artery disease)     a. 2005 Cath: 100% dLCX-->Med managed.  . Sick sinus syndrome (HCC)     a. 05/2008 s/p MDT EnRhythm DC PPM (Allred).  . Chronic pain     a. ? due to arthritis  . Glaucoma   . Hyperlipidemia   . Diverticulitis large intestine   . Allergy   . Phlebitis     a. after pacemeker placement in 2010.  [name] Colon polyps   . Hypothyroidism   . Transfusion history     a. After childbirth 60 years ago (1957)  . Rash/skin eruption     a. Multiple episodes - wide distribution-intermittent, possibly drug rash.  . Pulmonary HTN (HCC)     a. PASP on echo 04/2013.  [name] Chronic Right Pleural Effusion  a. 07/2012 s/p thoracentesis - Followed by Dr. Delford Field [facility]monology.  . Arthritis   . Bronchitis   . Hay fever   . Colon polyps   . History of blood transfusion   . Urine incontinence   . Back complaints   . Acalculous cholecystitis     Patient Active Problem List   Diagnosis Date Noted  . Glaucoma 11/22/2014  . Cholecystitis 10/17/2014  . Protein-calorie malnutrition, severe (HCC) 06/01/2014  . CHF (congestive heart failure), NYHA class I (HCC) 05/31/2014  . Cardiac pacemaker in situ 03/10/2014  . HLD (hyperlipidemia) 03/10/2014  . Pulmonary HTN (HCC) 06/09/2013  . Nocturnal hypoxemia 04/20/2013  . Depression 06/23/2011  . Diastolic CHF, chronic (HCC) 11/13/2010  . Recurrent pleural effusion on right 10/01/2010  .  Chronic anticoagulation 07/10/2010  . Hypothyroidism 04/24/2010  . Essential hypertension, benign 03/01/2010  . ATRIAL FIBRILLATION, CHRONIC 03/01/2010    Past Surgical History  Procedure Laterality Date  . Appendectomy  1939  . Abdominal hysterectomy  1977  . Tonsillectomy  81 yrs old  . Kidney surgery  1955    Right  . Ankle fracture surgery  1979    Left, pin  . Pacemaker insertion  06/20/08    MDT EnRhythm DR implanted by Dr Reyes Ivan  . Breast surgery      Needle guided excision, right breast calcification  . Breast fibroadenoma surgery  2010    Right  . Skin graft      left leg  . Breast biopsy  2011    Current Outpatient Rx  Name  Route  Sig  Dispense  Refill  . acetaminophen (TYLENOL) 650 MG CR tablet   Oral   Take 1,300 mg by mouth at bedtime as needed for pain.         [name] atenolol (TENORMIN) 25 MG tablet   Oral   Take 1 tablet (25 mg total) by mouth 2 (two) times daily.   180 tablet   3   . atorvastatin (LIPITOR) 40 MG tablet   Oral   Take 0.5 tablets (20 mg total) by mouth at bedtime.   45 tablet   3   . diltiazem (CARDIZEM CD) 180 MG 24 hr capsule      TAKE ONE CAPSULE DAILY   90 capsule   3   . dorzolamide-timolol (COSOPT) 22.3-6.8 MG/ML ophthalmic solution   Both Eyes   Place 1 drop into both eyes 2 (two) times daily.          . furosemide (LASIX) 40 MG tablet   Oral   Take 60 mg by mouth daily.    60 tablet   6   . HYDROcodone-acetaminophen (NORCO) 10-325 MG tablet   Oral   Take 1 tablet by mouth every 4 (four) hours as needed for moderate pain.   30 tablet   0   . levothyroxine (SYNTHROID, LEVOTHROID) 112 MCG tablet   Oral   Take 1 tablet (112 mcg total) by mouth daily.   90 tablet   1   . polyethylene glycol (MIRALAX / GLYCOLAX) packet   Oral   Take 17 g by mouth at bedtime.          . potassium chloride (K-DUR) 10 MEQ tablet   Oral   Take 10 mEq by mouth 2 (two) times daily.         . traMADol (ULTRAM) 50 MG tablet    Oral   Take 1 tablet by mouth 2 (two) times  daily.       0   . Travoprost, BAK Free, (TRAVATAN) 0.004 % SOLN ophthalmic solution   Both Eyes   Place 1 drop into both eyes at bedtime.         . Vitamin D, Ergocalciferol, (DRISDOL) 50000 UNITS CAPS capsule   Oral   Take 50,000 Units by mouth every 7 (seven) days. Pt takes on 06-20-2022.         . warfarin (COUMADIN) 5 MG tablet      TAKE AS DIRECTED BY COUMADIN CLINIC Patient taking differently: Take 2.5-5 mg by mouth every evening. Pt takes 2.5mg  on Jun 20, 2022 and 5mg  all other days.   90 tablet   1     90-day supply     Allergies Amoxicillin; Cetylpyridinium; Digoxin; and Risedronate sodium  Family History  Problem Relation Age of Onset  . Cancer Mother     colon with mets  . Diabetes Mother   . Heart disease Father   . Hyperlipidemia Father   . Hypertension Father   . Stroke Brother     brother who died 06-20-22  . Cancer Paternal Aunt     breast  . Cancer Cousin     uterine, lung cancer  . Heart attack Brother   . Heart attack Father     Social History Social History  Substance Use Topics  . Smoking status: Never Smoker   . Smokeless tobacco: Never Used  . Alcohol Use: No    Review of Systems Constitutional: No fever/chills Eyes: No visual changes. ENT: No sore throat. No stiff neck no neck pain Cardiovascular: Denies chest pain. Respiratory: See history of present illness Gastrointestinal:   no vomiting.  No diarrhea.  No constipation. Genitourinary: Negative for dysuria. Musculoskeletal: Negative lower extremity swelling Skin: Negative for rash. Neurological: Negative for headaches, focal weakness or numbness. 10-point ROS otherwise negative.  ____________________________________________   PHYSICAL EXAM:  VITAL SIGNS: ED Triage Vitals  Enc Vitals Group     BP 01/22/15 1224 157/87 mmHg     Pulse Rate 01/22/15 1224 73     Resp 01/22/15 1224 26     Temp 01/22/15 1224 98.2 F (36.8 C)      Temp Source 01/22/15 1224 Oral     SpO2 01/22/15 1224 92 %     Weight 01/22/15 1224 100 lb (45.36 kg)     Height 01/22/15 1224 5\' 4"  (1.626 m)     Head Cir --      Peak Flow --      Pain Score 01/22/15 1226 5     Pain Loc --      Pain Edu? --      Excl. in GC? --     Constitutional: Alert and oriented to name and place unsure of exact date but understands the holiday season etc. Well appearing and in no acute distress. Eyes: Conjunctivae are normal. PERRL. EOMI. Head: Atraumatic. Nose: No congestion/rhinnorhea. Mouth/Throat: Mucous membranes are moist.  Oropharynx non-erythematous. Neck: No stridor.   Nontender with no meningismus Cardiovascular: Normal rate, regular rhythm. Grossly normal heart sounds.  Good peripheral circulation. Respiratory: Normal respiratory effort.  No retractions. Lungs CTAB. Abdominal: Her tube is in tact and in the right position far as can be terminated this time with no evidence of extravasation or erythema to suggest infection. Back:  There is no focal tenderness or step off there is no midline tenderness there are no lesions noted. there is no CVA tenderness Musculoskeletal: No lower  extremity tenderness. No joint effusions, no DVT signs strong distal pulses no edema Neurologic:  Normal speech and language. No gross focal neurologic deficits are appreciated.  Skin:  Skin is warm, dry and intact. No rash noted. Psychiatric: Mood and affect are normal. Speech and behavior are normal.  ____________________________________________   LABS (all labs ordered are listed, but only abnormal results are displayed)  Labs Reviewed  BASIC METABOLIC PANEL - Abnormal; Notable for the following:    Glucose, Bld 105 (*)    BUN 23 (*)    Calcium 8.7 (*)    All other components within normal limits  CBC - Abnormal; Notable for the following:    Hemoglobin 11.9 (*)    RDW 15.6 (*)    All other components within normal limits  URINALYSIS COMPLETEWITH MICROSCOPIC  ([facility] ONLY) - Abnormal; Notable for the following:    Color, Urine YELLOW (*)    APPearance CLEAR (*)    All other components within normal limits  BRAIN NATRIURETIC PEPTIDE - Abnormal; Notable for the following:    B Natriuretic Peptide 1232.0 (*)    All other components within normal limits  URINE CULTURE  HEPATIC FUNCTION PANEL  TROPONIN I  LIPASE, BLOOD   ____________________________________________  EKG  I personally interpreted any EKGs ordered by me or triage Intermittent paced rhythm, no acute ST elevation or depression. Rate 70 bpm ____________________________________________  RADIOLOGY  I reviewed any imaging ordered by me or triage that were performed during my shift ____________________________________________   PROCEDURES  Procedure(s) performed: None  Critical Care performed: None  ____________________________________________   INITIAL IMPRESSION / ASSESSMENT AND PLAN / ED COURSE  Pertinent labs & imaging results that were available during my care of the patient were reviewed by me and considered in my medical decision making (see chart for details).  Patient with multiple different complaints mostly chronic. She has been not comply with her Lasix and has had slightly lower oxygen sats recently although she is in no acute respiratory distress chest x-ray is clear lungs show no evidence of rales or rhonchi. She does not have lower extremity edema at this time. I think this likely would benefit from diuresis and I will give her Lasix. Patient did miss at least 1 possibly 2 days of Lasix. Next concern is chronic upper quadrant pain. There is tenderness palpation in that area. It is unclear from her history how acute this is. Lipase is pending liver function tests are normal white count is normal vital signs are reassuring, we will discuss with surgery as this is been closely monitored by them.   ----------------------------------------- 4:45 PM on  01/22/2015 -----------------------------------------  Surgery will come evaluate the patient, I do suspect her mild dyspnea is secondary to her noncompliance with Lasix and we are giving her Lasix here. Patient and family are very comfortable with discharge home though prefer to go home rather than stay. Admission was offered but they would prefer to leave. Return precautions and follow-up given and understood and if surgery clears her we will discharge her.  ----------------------------------------- 5:14 PM on 01/22/2015 -----------------------------------------  The patient was seen and evaluated by Dr. Ludwig Lean of surgery, I very much appreciate the consult. She does not feel that the patient requires any imaging or further workup for her chronic abdominal discomfort with the tube. Serial abdominal exams here benign. They do recommend that she drain the tube to a bag if the pain happens again to see if that helps. They will see the patient  as an outpatient next few days. Patient's risk for status is quite reasonable with no evidence of significant dyspnea we will discharge her with close outpatient follow-up. ____________________________________________   FINAL CLINICAL IMPRESSION(S) / ED DIAGNOSES  Final diagnoses:  None     Jeanmarie PlantJames A McShane, MD 01/22/15 1538  Jeanmarie PlantJames A McShane, MD 01/22/15 1646  Jeanmarie PlantJames A McShane, MD 01/22/15 1715  Jeanmarie PlantJames A McShane, MD 01/22/15 1721

## 2015-01-22 NOTE — H&P (Signed)
Laurie Larsen is an 81 y.o. female.   Chief Complaint: Cholecystotomy tube and dyspnea HPI: 81 yr old female with cholecystotomy tube and multiple medical issues well known to [facility].  She came to ED today with complaints of dyspnea after not taking lasix for a couple days but then also complained of RUQ pain.  Patient is a poor historian and unable to pinpoint a timing of the pain.  She does states it usually comes on shortly after eating but not every time.  She also states that she cannot associate it with any certain types of foods, fried, fatty foods or meats.  She also says that "it feels sometimes as if something is sticking inside my stomach."  When asked if it is in the location of the tube, she is unsure.  She says that the pain has never been as bad as the episode back in Sept and does not travel to her back up in is in the epigastrium and RUQ.  She states that she does not have any diarrhea, increased fluctuance or gas, nausea, vomiting or chills along with the pain.  She does state that she vomits almost every night with supper time meal and feels as if she gets full quickly, but stated that this is not associated with the pain.  She has not ever hooked the tube to the bag when the pain comes on to see if this decreases the pain.    Past Medical History  Diagnosis Date  . Permanent atrial fibrillation ([place])     a. Chronic Coumadin.  [name] HTN (hypertension)   . Chronic diastolic CHF (congestive heart failure), NYHA class 1 ([place])     a. 04/2013 Echo: EF 50-55%, triv MR, mildly dil LA, PASP 71mHg, mild TR, mild-mod RA dil.  .Marland KitchenCAD (coronary artery disease)     a. 2005 Cath: 100% dLCX-->Med managed.  . [place]     a. 05/2008 s/p MDT EnRhythm DC PPM ([facility]).  . Chronic pain     a. ? due to arthritis  . Glaucoma   . Hyperlipidemia   . Diverticulitis large intestine   . Allergy   . Phlebitis     a. after pacemeker placement in 2010.  .Marland KitchenColon polyps   .  Hypothyroidism   . Transfusion history     a. After childbirth 60 years ago (1957)  . Rash/skin eruption     a. Multiple episodes - wide distribution-intermittent, possibly drug rash.  . [place]monary HT[place] ([place]     a. PASP 47mg on echo 04/2013.  . Marland Kitchenhronic Right Pleural Effusion     a. 07/2012 s/p thoracentesis - [facility]  . Arthritis   . Bronchitis   . Hay fever   . Colon polyps   . History of blood transfusion   . Urine incontinence   . Back complaints   . Acalculous cholecystitis     Past Surgical History  Procedure Laterality Date  . Appendectomy  1939  . Abdominal hysterectomy  1977  . Tonsillectomy  [age]rs old  . Kidney surgery  1955    Right  . Ankle fracture surgery  1979    Left, pin  . Pacemaker insertion  06/20/08    MDT EnRhythm DR implanted by Dr KeVerlon Setting. Breast surgery      Needle guided excision, right breast calcification  . Breast fibroadenoma surgery  2010    Right  .  Skin graft      left leg  . Breast biopsy  2011    Family History  Problem Relation Age of Onset  . Cancer Mother     colon with mets  . Diabetes Mother   . Heart disease Father   . Hyperlipidemia Father   . Hypertension Father   . Stroke Brother     brother who died 03-23-22  . Cancer Paternal Aunt     breast  . Cancer Cousin     uterine, lung cancer  . Heart attack Brother   . Heart attack Father    Social History:  reports that she has never smoked. She has never used smokeless tobacco. She reports that she does not drink alcohol or use illicit drugs.  Allergies:  Allergies  Allergen Reactions  . Amoxicillin Other (See Comments)    Reaction:  Weakness  . Cetylpyridinium Other (See Comments)    weakness  . Digoxin Other (See Comments)    Reaction:  Weakness and dehydration  . Risedronate Sodium Rash     (Not in a hospital admission)  Results for orders placed or performed during the hospital encounter of 01/22/15 (from the past 48  hour(s))  Basic metabolic panel     Status: Abnormal   Collection Time: 01/22/15 12:33 PM  Result Value Ref Range   Sodium 137 135 - 145 mmol/L   Potassium 4.0 3.5 - 5.1 mmol/L   Chloride 107 101 - 111 mmol/L   CO2 24 22 - 32 mmol/L   Glucose, Bld 105 (H) 65 - 99 mg/dL   BUN 23 (H) 6 - 20 mg/dL   Creatinine, Ser 0.61 0.44 - 1.00 mg/dL   Calcium 8.7 (L) 8.9 - 10.3 mg/dL   GFR calc non Af Amer >60 >60 mL/min   GFR calc Af Amer >60 >60 mL/min    Comment: (NOTE) The eGFR has been calculated using the CKD EPI equation. This calculation has not been validated in all clinical situations. eGFR's persistently <60 mL/min signify possible Chronic Kidney Disease.    Anion gap 6 5 - 15  CBC     Status: Abnormal   Collection Time: 01/22/15 12:33 PM  Result Value Ref Range   WBC 7.8 3.6 - 11.0 K/uL   RBC 3.83 3.80 - 5.20 MIL/uL   Hemoglobin 11.9 (L) 12.0 - 16.0 g/dL   HCT 35.2 35.0 - 47.0 %   MCV 91.9 80.0 - 100.0 fL   MCH 31.0 26.0 - 34.0 pg   MCHC 33.8 32.0 - 36.0 g/dL   RDW 15.6 (H) 11.5 - 14.5 %   Platelets 201 150 - 440 K/uL  Brain natriuretic peptide     Status: Abnormal   Collection Time: 01/22/15 12:33 PM  Result Value Ref Range   B Natriuretic Peptide 1232.0 (H) 0.0 - 100.0 pg/mL  Hepatic function panel     Status: None   Collection Time: 01/22/15 12:33 PM  Result Value Ref Range   Total Protein 6.8 6.5 - 8.1 g/dL   Albumin 4.0 3.5 - 5.0 g/dL   AST 28 15 - 41 U/L   ALT 21 14 - 54 U/L   Alkaline Phosphatase 90 38 - 126 U/L   Total Bilirubin 0.7 0.3 - 1.2 mg/dL   Bilirubin, Direct 0.1 0.1 - 0.5 mg/dL   Indirect Bilirubin 0.6 0.3 - 0.9 mg/dL  Troponin I     Status: None   Collection Time: 01/22/15 12:33 PM  Result Value Ref Range  Troponin I <0.03 <0.031 ng/mL    Comment:        NO INDICATION OF MYOCARDIAL INJURY.   Lipase, blood     Status: None   Collection Time: 01/22/15 12:33 PM  Result Value Ref Range   Lipase 26 11 - 51 U/L  Protime-INR     Status: Abnormal    Collection Time: 01/22/15 12:33 PM  Result Value Ref Range   Prothrombin Time 28.6 (H) 11.4 - 15.0 seconds   INR 2.74   APTT     Status: None   Collection Time: 01/22/15 12:33 PM  Result Value Ref Range   aPTT 32 24 - 36 seconds  Urinalysis complete, with microscopic     Status: Abnormal   Collection Time: 01/22/15  2:37 PM  Result Value Ref Range   Color, Urine YELLOW (A) YELLOW   APPearance CLEAR (A) CLEAR   Glucose, UA NEGATIVE NEGATIVE mg/dL   Bilirubin Urine NEGATIVE NEGATIVE   Ketones, ur NEGATIVE NEGATIVE mg/dL   Specific Gravity, Urine 1.015 1.005 - 1.030   Hgb urine dipstick NEGATIVE NEGATIVE   pH 7.0 5.0 - 8.0   Protein, ur NEGATIVE NEGATIVE mg/dL   Nitrite NEGATIVE NEGATIVE   Leukocytes, UA NEGATIVE NEGATIVE   RBC / HPF 0-5 0 - 5 RBC/hpf   WBC, UA 0-5 0 - 5 WBC/hpf   Bacteria, UA NONE SEEN NONE SEEN   Squamous Epithelial / LPF NONE SEEN NONE SEEN   Mucous PRESENT    Dg Chest 2 View  01/22/2015  CLINICAL DATA:  Low-grade fever, shortness of breath, atrial fibrillation EXAM: CHEST  2 VIEW COMPARISON:  05/31/2014 FINDINGS: Left subclavian 2 lead pacer noted. Chronic Background COPD/emphysema. Bilateral pleural effusions noted with bibasilar and right hilar streaky opacities, atelectasis is favored over pneumonia. No current CHF. Upper lobes remain clear. No pneumothorax. Atherosclerosis noted of the aorta. Degenerative changes of the spine. IMPRESSION: Cardiomegaly without CHF Persistent small pleural effusions with bibasilar and right hilar atelectasis, less likely pneumonia. Background COPD/emphysema Electronically Signed   By: Jerilynn Mages.  Shick M.D.   On: 01/22/2015 15:01    Review of Systems  Constitutional: Positive for malaise/fatigue. Negative for fever, chills, weight loss and diaphoresis.  HENT: Negative for congestion and sore throat.   Respiratory: Positive for shortness of breath. Negative for cough, sputum production and wheezing.   Cardiovascular: Negative for  chest pain, palpitations and leg swelling.  Gastrointestinal: Positive for abdominal pain and constipation. Negative for nausea, vomiting, diarrhea, blood in stool and melena.  Genitourinary: Negative for dysuria, urgency, frequency, hematuria and flank pain.  Musculoskeletal: Positive for myalgias, back pain and joint pain. Negative for falls.  Skin: Negative for itching and rash.  Neurological: Positive for weakness. Negative for dizziness and loss of consciousness.  Psychiatric/Behavioral: The patient is nervous/anxious and has insomnia.   All other systems reviewed and are negative.   Blood pressure 177/99, pulse 65, temperature 98.2 F (36.8 C), temperature source Oral, resp. rate 20, height 5' 4"  (1.626 m), weight 100 lb (45.36 kg), SpO2 94 %. Physical Exam  Vitals reviewed. Constitutional: She is oriented to person, place, and time. She appears well-developed and well-nourished. No distress.  HENT:  Head: Normocephalic and atraumatic.  Right Ear: External ear normal.  Left Ear: External ear normal.  Nose: Nose normal.  Mouth/Throat: Oropharynx is clear and moist. No oropharyngeal exudate.  Eyes: Conjunctivae are normal. Pupils are equal, round, and reactive to light. No scleral icterus.  Neck: Normal range of motion.  Neck supple. No tracheal deviation present.  Cardiovascular: Normal rate, regular rhythm and intact distal pulses.  Exam reveals no gallop.   No murmur heard. Respiratory: Effort normal and breath sounds normal. No respiratory distress. She has no wheezes.  GI: Soft. Bowel sounds are normal. She exhibits no distension. There is no tenderness. There is no rebound and no guarding.  RUQ cholecystostomy tube in place, no erythema or drainage around tube, no tenderness  Musculoskeletal: Normal range of motion. She exhibits no edema or tenderness.  Neurological: She is alert and oriented to person, place, and time. No cranial nerve deficit.  Skin: Skin is warm and dry. No  rash noted. No erythema.  Psychiatric: She has a normal mood and affect. Her behavior is normal. Judgment and thought content normal.     Assessment/Plan 81 yr old female with cholecystostomy tube in place.  I discussed that given the history it is hard to know if the pain is just from the tube itself being in place or if it is because of gallbladder dysfunction.  I have personally reviewed her history since September, I have personally reviewed her laboratory studies this visit without significant abnormality,  I have personally reviewed her past images and radiology reads as well as radiology notes from IR examination of the tube.  She is to have drain study on 1/6.  I suggested she hook the drainage tube to bag drain next time she has the pain and see if this improves it. If it does cause improvement she does not need drain removed even if there is biliary patency.  If draining the biliary does not improve pain and the biliary tract is patent then the tube could be removed, understanding that it would requiring placement of another drain if she were to get recurrent cholecystitis.  Patient's son in the room, asking questions and had them answered.  Will have her f/u with Dr. Adonis Huguenin in clinic after 1/6.    Lavona Mound Keyleen Cerrato 01/22/2015, 4:59 PM

## 2015-01-22 NOTE — Discharge Instructions (Signed)
Do not skip your lasix doses, as this will lead to shortness of breath. If you have chest pain or increasing shortness of breath or significant leg swelling, return to the emergency department.   If you have pain around your drain, please attach the drainage bag and drain it for 4 hours.  Your surgeons feel this will help your pain. If you have increased pain, fever, vomiting, change in your chronic pain or you feel worse in any way, return to medical attention.

## 2015-01-23 LAB — URINE CULTURE

## 2015-01-24 ENCOUNTER — Encounter: Payer: Self-pay | Admitting: Internal Medicine

## 2015-01-25 LAB — PROTIME-INR: INR: 2.8 — AB (ref 0.9–1.1)

## 2015-01-26 ENCOUNTER — Ambulatory Visit (INDEPENDENT_AMBULATORY_CARE_PROVIDER_SITE_OTHER): Payer: Medicare Other | Admitting: Cardiovascular Disease

## 2015-01-26 DIAGNOSIS — I482 Chronic atrial fibrillation, unspecified: Secondary | ICD-10-CM

## 2015-01-30 ENCOUNTER — Ambulatory Visit: Payer: Medicare Other | Admitting: Internal Medicine

## 2015-01-31 ENCOUNTER — Ambulatory Visit (INDEPENDENT_AMBULATORY_CARE_PROVIDER_SITE_OTHER): Payer: Medicare Other | Admitting: Internal Medicine

## 2015-01-31 DIAGNOSIS — I482 Chronic atrial fibrillation, unspecified: Secondary | ICD-10-CM

## 2015-01-31 DIAGNOSIS — I5032 Chronic diastolic (congestive) heart failure: Secondary | ICD-10-CM

## 2015-01-31 DIAGNOSIS — I10 Essential (primary) hypertension: Secondary | ICD-10-CM

## 2015-01-31 DIAGNOSIS — E039 Hypothyroidism, unspecified: Secondary | ICD-10-CM

## 2015-01-31 NOTE — Progress Notes (Signed)
Subjective:    Patient ID: Laurie Larsen, female    DOB: [birth year], 82 y.o.   MRN: [id number]  HPI  Visit rescheduled   Wt Readings from Last 3 Encounters:  01/22/15 100 lb (45.36 kg)  01/11/15 103 lb (46.72 kg)  12/08/14 99 lb (44.906 kg)   BP Readings from Last 3 Encounters:  01/22/15 168/85  01/11/15 128/76  01/03/15 157/87    Past Medical History  Diagnosis Date  . Permanent atrial fibrillation (HCC)     a. Chronic Coumadin.  [name] HTN (hypertension)   . Chronic diastolic CHF (congestive heart failure), NYHA class 1 (HCC)     a. 04/2013 Echo: EF 50-55%, triv MR, mildly dil LA, PASP , mild TR, mild-mod RA dil.  [name] CAD (coronary artery disease)     a. 2005 Cath: 100% dLCX-->Med managed.  . Sick sinus syndrome (HCC)     a. 05/2008 s/p MDT EnRhythm DC PPM ([facility]).  . Chronic pain     a. ? due to arthritis  . Glaucoma   . Hyperlipidemia   . Diverticulitis large intestine   . Allergy   . Phlebitis     a. after pacemeker placement in 2010.  [name] Colon polyps   . Hypothyroidism   . Transfusion history     a. After childbirth 60 years ago (1957)  . Rash/skin eruption     a. Multiple episodes - wide distribution-intermittent, possibly drug rash.  . Pulmonary HTN (HCC)     a. PASP on echo 04/2013.  [name] Chronic Right Pleural Effusion     a. 07/2012 s/p thoracentesis - Followed by Dr. Delford Field [facility].  . Arthritis   . Bronchitis   . Hay fever   . Colon polyps   . History of blood transfusion   . Urine incontinence   . Back complaints   . Acalculous cholecystitis    Family History  Problem Relation Age of Onset  . Cancer Mother     colon with mets  . Diabetes Mother   . Heart disease Father   . Hyperlipidemia Father   . Hypertension Father   . Stroke Brother     brother who died Jun 18, 2022  . Cancer Paternal Aunt     breast  . Cancer Cousin     uterine, lung cancer  . Heart attack Brother   . Heart attack Father    Past Surgical History    Procedure Laterality Date  . Appendectomy  1939  . Abdominal hysterectomy  1977  . Tonsillectomy  82 yrs old  . Kidney surgery  1955    Right  . Ankle fracture surgery  1979    Left, pin  . Pacemaker insertion  06/20/08    MDT EnRhythm DR implanted by Dr Reyes Ivan  . Breast surgery      Needle guided excision, right breast calcification  . Breast fibroadenoma surgery  2010    Right  . Skin graft      left leg  . Breast biopsy  2011   Social History   Social History  . Marital Status: Married    Spouse Name: Lorella Nimrod  . Number of Children: 3  . Years of Education: 13   Occupational History  . RETIRED    Social History Main Topics  . Smoking status: Never Smoker   . Smokeless tobacco: Never Used  . Alcohol Use: No  . Drug Use: No  . Sexual Activity: No   Other Topics  Concern  . 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   Work: [employer] and then [employer] de[employer] - retired.  Married 1948. 3 sons - '52, '57, '58; 5 grandchildren-one deceased -  OD @ 9421.  Lives alone with husband and they are independent in ADLs.  End of Life Care: no prolonged heroic measures, i.e. Artificial feeding or hydration; DNR; no prolonged intubation.    Review of Systems     Objective:    There were no vitals taken for this visit. Physical Exam        Assessment & Plan:   Problem List Items Addressed This Visit    None       No Follow-up on file.

## 2015-02-02 ENCOUNTER — Ambulatory Visit
Admission: RE | Admit: 2015-02-02 | Discharge: 2015-02-02 | Disposition: A | Discharge status: Home / Self Care | Payer: Medicare Other | Source: Ambulatory Visit | Attending: General Surgery | Admitting: General Surgery

## 2015-02-02 DIAGNOSIS — Z86010 Personal history of colonic polyps: Secondary | ICD-10-CM | POA: Insufficient documentation

## 2015-02-02 DIAGNOSIS — Z8049 Family history of malignant neoplasm of other genital organs: Secondary | ICD-10-CM | POA: Insufficient documentation

## 2015-02-02 DIAGNOSIS — Z8249 Family history of ischemic heart disease and other diseases of the circulatory system: Secondary | ICD-10-CM | POA: Diagnosis not present

## 2015-02-02 DIAGNOSIS — Z800 Family history of malignant neoplasm of digestive organs: Secondary | ICD-10-CM | POA: Insufficient documentation

## 2015-02-02 DIAGNOSIS — I495 Sick sinus syndrome: Secondary | ICD-10-CM | POA: Insufficient documentation

## 2015-02-02 DIAGNOSIS — E039 Hypothyroidism, unspecified: Secondary | ICD-10-CM | POA: Diagnosis not present

## 2015-02-02 DIAGNOSIS — Z833 Family history of diabetes mellitus: Secondary | ICD-10-CM | POA: Insufficient documentation

## 2015-02-02 DIAGNOSIS — Z4682 Encounter for fitting and adjustment of non-vascular catheter: Secondary | ICD-10-CM | POA: Diagnosis not present

## 2015-02-02 DIAGNOSIS — I272 Other secondary pulmonary hypertension: Secondary | ICD-10-CM | POA: Insufficient documentation

## 2015-02-02 DIAGNOSIS — Z823 Family history of stroke: Secondary | ICD-10-CM | POA: Insufficient documentation

## 2015-02-02 DIAGNOSIS — Z7901 Long term (current) use of anticoagulants: Secondary | ICD-10-CM | POA: Insufficient documentation

## 2015-02-02 DIAGNOSIS — Z950 Presence of cardiac pacemaker: Secondary | ICD-10-CM | POA: Insufficient documentation

## 2015-02-02 DIAGNOSIS — G8929 Other chronic pain: Secondary | ICD-10-CM | POA: Insufficient documentation

## 2015-02-02 DIAGNOSIS — I10 Essential (primary) hypertension: Secondary | ICD-10-CM | POA: Diagnosis not present

## 2015-02-02 DIAGNOSIS — K5732 Diverticulitis of large intestine without perforation or abscess without bleeding: Secondary | ICD-10-CM | POA: Diagnosis not present

## 2015-02-02 DIAGNOSIS — Z803 Family history of malignant neoplasm of breast: Secondary | ICD-10-CM | POA: Diagnosis not present

## 2015-02-02 DIAGNOSIS — H409 Unspecified glaucoma: Secondary | ICD-10-CM | POA: Insufficient documentation

## 2015-02-02 DIAGNOSIS — K819 Cholecystitis, unspecified: Secondary | ICD-10-CM | POA: Diagnosis not present

## 2015-02-02 DIAGNOSIS — I2510 Atherosclerotic heart disease of native coronary artery without angina pectoris: Secondary | ICD-10-CM | POA: Diagnosis not present

## 2015-02-02 DIAGNOSIS — Z90710 Acquired absence of both cervix and uterus: Secondary | ICD-10-CM | POA: Insufficient documentation

## 2015-02-02 DIAGNOSIS — E785 Hyperlipidemia, unspecified: Secondary | ICD-10-CM | POA: Insufficient documentation

## 2015-02-02 DIAGNOSIS — I5032 Chronic diastolic (congestive) heart failure: Secondary | ICD-10-CM | POA: Diagnosis not present

## 2015-02-02 DIAGNOSIS — J90 Pleural effusion, not elsewhere classified: Secondary | ICD-10-CM | POA: Diagnosis not present

## 2015-02-02 DIAGNOSIS — I482 Chronic atrial fibrillation: Secondary | ICD-10-CM | POA: Diagnosis not present

## 2015-02-02 DIAGNOSIS — M1990 Unspecified osteoarthritis, unspecified site: Secondary | ICD-10-CM | POA: Diagnosis not present

## 2015-02-02 MED ORDER — IOHEXOL 300 MG/ML  SOLN
30.0000 mL | Freq: Once | INTRAMUSCULAR | Status: AC | PRN
  Administered 2015-02-02: 15 mL

## 2015-02-02 NOTE — Discharge Instructions (Signed)
°  Pt needs to have chole tube flushed with 10 ml normal saline every week. It has a one way valve, which needs to be cleaned with alcohol then secure 10 ml sterile saline push slowly. If you meet resistance notify Primary physician. Tube to be changed in three months, primary md to schedule this. In event pt develops abdominal pain, connect provided drainage bag to drain, Notify primary physician.  Notify md if developes fever, shortness of breath or change in pain. If life threatening notifiy transport to ED and inform them of her procedure.

## 2015-02-06 ENCOUNTER — Ambulatory Visit (INDEPENDENT_AMBULATORY_CARE_PROVIDER_SITE_OTHER): Payer: Medicare Other | Admitting: *Deleted

## 2015-02-06 DIAGNOSIS — I482 Chronic atrial fibrillation, unspecified: Secondary | ICD-10-CM

## 2015-02-06 DIAGNOSIS — Z950 Presence of cardiac pacemaker: Secondary | ICD-10-CM

## 2015-02-06 LAB — CUP PACEART INCLINIC DEVICE CHECK
Battery Voltage: 2.81 V
Brady Statistic RV Percent Paced: 43.56 %
Date Time Interrogation Session: 20170110100743
Implantable Lead Implant Date: 20100525
Implantable Lead Location: 753859
Implantable Lead Model: 5076
Implantable Lead Model: 5076
Lead Channel Pacing Threshold Amplitude: 1 V
Lead Channel Pacing Threshold Pulse Width: 0.4 ms
Lead Channel Sensing Intrinsic Amplitude: 6.7 mV
Lead Channel Setting Sensing Sensitivity: 0.9 mV
MDC IDC LEAD IMPLANT DT: 20100525
MDC IDC LEAD LOCATION: 753860
MDC IDC MSMT LEADCHNL RA IMPEDANCE VALUE: 448 Ohm
MDC IDC MSMT LEADCHNL RV IMPEDANCE VALUE: 424 Ohm
MDC IDC SET LEADCHNL RV PACING AMPLITUDE: 2.5 V
MDC IDC SET LEADCHNL RV PACING PULSEWIDTH: 0.4 ms

## 2015-02-06 LAB — PROTIME-INR: INR: 2.8 — AB (ref 0.9–1.1)

## 2015-02-06 NOTE — Progress Notes (Signed)
Pacemaker check in clinic. Normal device function, ERI as of 01/15/15. Thresholds, sensing, impedances consistent with previous measurements. Device programmed to maximize longevity. 1 mode switch, 100% AF, +warfarin. No high ventricular rates noted. Device programmed at appropriate safety margins. Histogram distribution appropriate for patient activity level. Device programmed to optimize intrinsic conduction. Reprogrammed mode from VVI 65 to VVIR 70 (programming prior to ERI). Patient education completed. ROV with SK/B on 03/08/15 at 10:45am to discuss gen change.

## 2015-02-07 ENCOUNTER — Ambulatory Visit (INDEPENDENT_AMBULATORY_CARE_PROVIDER_SITE_OTHER): Payer: Medicare Other | Admitting: Internal Medicine

## 2015-02-07 DIAGNOSIS — I482 Chronic atrial fibrillation, unspecified: Secondary | ICD-10-CM

## 2015-02-12 ENCOUNTER — Encounter: Payer: Self-pay | Admitting: Internal Medicine

## 2015-02-14 ENCOUNTER — Encounter: Payer: Self-pay | Admitting: Internal Medicine

## 2015-02-20 ENCOUNTER — Ambulatory Visit (INDEPENDENT_AMBULATORY_CARE_PROVIDER_SITE_OTHER): Payer: Medicare Other | Admitting: Internal Medicine

## 2015-02-20 ENCOUNTER — Encounter: Payer: Self-pay | Admitting: Internal Medicine

## 2015-02-20 VITALS — BP 136/73 | HR 71 | Temp 97.5°F | Ht 64.0 in | Wt 101.0 lb

## 2015-02-20 DIAGNOSIS — Z23 Encounter for immunization: Secondary | ICD-10-CM | POA: Diagnosis not present

## 2015-02-20 DIAGNOSIS — Z1239 Encounter for other screening for malignant neoplasm of breast: Secondary | ICD-10-CM

## 2015-02-20 DIAGNOSIS — I5032 Chronic diastolic (congestive) heart failure: Secondary | ICD-10-CM

## 2015-02-20 DIAGNOSIS — G4700 Insomnia, unspecified: Secondary | ICD-10-CM | POA: Insufficient documentation

## 2015-02-20 DIAGNOSIS — T85518S Breakdown (mechanical) of other gastrointestinal prosthetic devices, implants and grafts, sequela: Secondary | ICD-10-CM

## 2015-02-20 DIAGNOSIS — E039 Hypothyroidism, unspecified: Secondary | ICD-10-CM | POA: Diagnosis not present

## 2015-02-20 DIAGNOSIS — T85598S Other mechanical complication of other gastrointestinal prosthetic devices, implants and grafts, sequela: Secondary | ICD-10-CM

## 2015-02-20 DIAGNOSIS — G894 Chronic pain syndrome: Secondary | ICD-10-CM | POA: Insufficient documentation

## 2015-02-20 DIAGNOSIS — H3530 Unspecified macular degeneration: Secondary | ICD-10-CM

## 2015-02-20 DIAGNOSIS — I482 Chronic atrial fibrillation, unspecified: Secondary | ICD-10-CM

## 2015-02-20 DIAGNOSIS — I10 Essential (primary) hypertension: Secondary | ICD-10-CM

## 2015-02-20 MED ORDER — HYDROCODONE-ACETAMINOPHEN 10-325 MG PO TABS: 1.0000 | ORAL_TABLET | Freq: Three times a day (TID) | ORAL | Status: DC | PRN

## 2015-02-20 MED ORDER — TRAMADOL HCL 50 MG PO TABS: 50.0000 mg | ORAL_TABLET | Freq: Three times a day (TID) | ORAL | Status: DC | PRN

## 2015-02-20 MED ORDER — MIRTAZAPINE 15 MG PO TABS: 15.0000 mg | ORAL_TABLET | Freq: Every day | ORAL | Status: DC

## 2015-02-20 NOTE — Assessment & Plan Note (Signed)
Chronic pain from scoliosis in her back. Followed by [facility] in the past, however each visit costs her $100 for ride from [place] [facility] [place]. Will plan to take over pain management here. She understands risks associated with chronic narcotic use. Encouraged her to limit use as much as possible. Follow up in 4 weeks.

## 2015-02-20 NOTE — Assessment & Plan Note (Signed)
Symptomatically, doing well. Euvolemic on exam today. Continue Atenolol and Furosemide.

## 2015-02-20 NOTE — Assessment & Plan Note (Signed)
Will plan to check TSH with labs next month. Continue Levothyroxine.

## 2015-02-20 NOTE — Assessment & Plan Note (Signed)
Rate well controlled. Pacemaker in place. Continue Atenolol and chronic anticoagulation with coumadin, as monitored by cardiology.

## 2015-02-20 NOTE — Progress Notes (Signed)
Pre visit review using our clinic review tool, if applicable. No additional management support is needed unless otherwise documented below in the visit note. 

## 2015-02-20 NOTE — Progress Notes (Signed)
Subjective:    Patient ID: [name], female    DOB: October 29, 1923, 82 y.o.   MRN: [id number]  HPI 82YO female presents to establish care. Previously seen by Dr. Adriana Simas.  AFIB - Has pacemaker. Followed by Dr. Graciela Husbands. On chronic anticoagulation with coumadin and this is monitored by cardiology.  Gallstones - Sept 2016. Unable to have surgery because of risk. Had cholecystostomy tube placed. Has these drained weekly. Has occasional right upper quad pain, just below ribs. Not severe.  Tolerating a full diet. Appetite poor.  Has some chronic swelling in lower legs. Worse if eats at restaurant with increased salt. Takes lasix daily.  Insomnia - Unable to fall asleep. Sometimes awake until 3am. Sometimes feels short of breath at night. Wears oxygen at night which helps. Never taken medication to help with sleep. Sometimes tired during day.  Chronic pain - Secondary to spinal stenosis and scoliosis. Takes Tramadol or Hydrocodone 2-3 times daily with some improvement in pain. Follow[facility], Dr. Ethelene Hal. Tries to limit medication as much as possible    Lives at [facility] [place] with husband.  Wt Readings from Last 3 Encounters:  02/20/15 101 lb (45.813 kg)  02/02/15 98 lb (44.453 kg)  01/22/15 100 lb (45.36 kg)   BP Readings from Last 3 Encounters:  02/20/15 136/73  02/02/15 134/61  01/22/15 168/85    Past Medical History  Diagnosis Date  . Permanent atrial fibrillation (HCC)     a. Chronic Coumadin.  [name] HTN (hypertension)   . Chronic diastolic CHF (congestive heart failure), NYHA class 1 (HCC)     a. 04/2013 Echo: EF 50-55%, triv MR, mildly dil LA, PASP , mild TR, mild-mod RA dil.  [name] CAD (coronary artery disease)     a. 2005 Cath: 100% dLCX-->Med managed.  . Sick sinus syndrome (HCC)     a. 05/2008 s/p MDT EnRhythm DC PPM ([facility]).  . Chronic pain     a. ? due to arthritis  . Glaucoma   . Hyperlipidemia   . Diverticulitis large intestine   . Allergy     . Phlebitis     a. after pacemeker placement in 2010.  [name] Colon polyps   . Hypothyroidism   . Transfusion history     a. After childbirth 60 years ago (1957)  . Rash/skin eruption     a. Multiple episodes - wide distribution-intermittent, possibly drug rash.  . Pulmonary HTN (HCC)     a. PASP on echo 04/2013.  [name] Chronic Right Pleural Effusion     a. 07/2012 s/p thoracentesis - Followed by Dr. Delford Field [facility].  . Arthritis   . Bronchitis   . Hay fever   . Colon polyps   . History of blood transfusion   . Urine incontinence   . Back complaints   . Acalculous cholecystitis    Family History  Problem Relation Age of Onset  . Cancer Mother     colon with mets  . Diabetes Mother   . Heart disease Father   . Hyperlipidemia Father   . Hypertension Father   . Heart attack Father   . Stroke Brother     brother who died Jun 13, 2022  . Cancer Paternal Aunt     breast  . Cancer Cousin     uterine, lung cancer  . Heart attack Brother    Past Surgical History  Procedure Laterality Date  . Appendectomy  1939  . Abdominal hysterectomy  1977  .  Tonsillectomy  82 yrs old  . Kidney surgery  1955    Right  . Ankle fracture surgery  1979    Left, pin  . Pacemaker insertion  06/20/08    MDT EnRhythm DR implanted by Dr Reyes Ivan  . Breast surgery      Needle guided excision, right breast calcification  . Breast fibroadenoma surgery  05/29/2008    Right  . Skin graft      left leg  . Breast biopsy  May 29, 2009   Social History   Social History  . Marital Status: Married    Spouse Name: Lorella Nimrod  . Number of Children: 3  . Years of Education: 13   Occupational History  . RETIRED    Social History Main Topics  . Smoking status: Never Smoker   . Smokeless tobacco: Never Used  . Alcohol Use: No  . Drug Use: No  . Sexual Activity: No   Other Topics Concern  . None   Social History Narrative   Lives with husband in [place]. Three sons.      HSG, Business school  - Psychologist, forensic.   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     Married 1948.    3 sons - 05-30-2050, May 30, 2055, 29-May-2056; 5 grandchildren-one deceased -  OD @ 4.        End of Life Care: no prolonged heroic measures, i.e. Artificial feeding or hydration; DNR; no prolonged intubation.    Review of Systems  Constitutional: Negative for fever, chills, appetite change, fatigue and unexpected weight change.  HENT: Negative for congestion, sore throat, tinnitus, trouble swallowing and voice change.   Eyes: Negative for visual disturbance.  Respiratory: Negative for shortness of breath.   Cardiovascular: Negative for chest pain, palpitations and leg swelling.  Gastrointestinal: Negative for nausea, vomiting, abdominal pain, diarrhea and constipation.  Skin: Negative for color change and rash.  Hematological: Negative for adenopathy. Does not bruise/bleed easily.  Psychiatric/Behavioral: Negative for dysphoric mood. The patient is not nervous/anxious.        Objective:    BP 136/73 mmHg  Pulse 71  Temp(Src) 97.5 F (36.4 C) (Oral)  Ht  (1.626 m)  Wt 101 lb (45.813 kg)  BMI 17.33 kg/m2  SpO2 94% Physical Exam  Constitutional: She is oriented to person, place, and time. She appears well-developed and well-nourished. No distress.  HENT:  Head: Normocephalic and atraumatic.  Right Ear: External ear normal.  Left Ear: External ear normal.  Nose: Nose normal.  Mouth/Throat: Oropharynx is clear and moist. No oropharyngeal exudate.  Eyes: Conjunctivae are normal. Pupils are equal, round, and reactive to light. Right eye exhibits no discharge. Left eye exhibits no discharge. No scleral icterus.  Neck: Normal range of motion. Neck supple. No tracheal deviation present. No thyromegaly present.  Cardiovascular: Normal rate, regular rhythm, normal heart sounds and intact distal pulses.   Extrasystoles are present. Exam reveals no gallop and no friction rub.   No murmur  heard. Pulmonary/Chest: Effort normal and breath sounds normal. No respiratory distress. She has no wheezes. She has no rales. She exhibits no tenderness.  Abdominal: Soft. Bowel sounds are normal. She exhibits no distension and no mass. There is no tenderness. There is no rebound and no guarding.    Musculoskeletal: She exhibits edema (trace edema left ankle).       Thoracic back: She exhibits decreased range of motion, tenderness, bony tenderness, deformity (scoliosis) and pain.  Lymphadenopathy:  She has no cervical adenopathy.  Neurological: She is alert and oriented to person, place, and time. No cranial nerve deficit. She exhibits normal muscle tone. Coordination normal.  Skin: Skin is warm and dry. No rash noted. She is not diaphoretic. No erythema. No pallor.  Psychiatric: She has a normal mood and affect. Her behavior is normal. Judgment and thought content normal.          Assessment & Plan:   Over of which >50% spent in face-to-face contact with patient discussing plan of care and reviewing all medical issues.   Problem List Items Addressed This Visit      Unprioritized   ATRIAL FIBRILLATION, CHRONIC - Primary    Rate well controlled. Pacemaker in place. Continue Atenolol and chronic anticoagulation with coumadin, as monitored by cardiology.       Cholecystostomy tube dysfunction    Cholecystostomy tube functioning well. Will continue weekly drains with RN at [facility]. Plan to recheck LFTs with labs in 4 weeks.      Chronic pain syndrome    Chronic pain from scoliosis in her back. Followed by [facility] in the past, however each visit costs her $100 for ride from [place] [facility] [place]. Will plan to take over pain management here. She understands risks associated with chronic narcotic use. Encouraged her to limit use as much as possible. Follow up in 4 weeks.      Diastolic CHF, chronic (HCC)    Symptomatically, doing well. Euvolemic on  exam today. Continue Atenolol and Furosemide.      Essential hypertension, benign    BP Readings from Last 3 Encounters:  02/20/15 136/73  02/02/15 134/61  01/22/15 168/85   BP well controlled. Renal function with labs next month. Continue current medications.      Hypothyroidism (Chronic)    Will plan to check TSH with labs next month. Continue Levothyroxine.      Insomnia    Recent insomnia. Will add Remeron  at bedtime. Follow up in 4 weeks and prn.      Macular degeneration    Followed at [facility].      Screening for breast cancer    Mammogram ordered.      Relevant Orders   MM Digital Screening    Other Visit Diagnoses    Need for vaccination with 13-polyvalent pneumococcal conjugate vaccine        Relevant Orders    Pneumococcal conjugate vaccine 13-valent (Completed)        Return in about 4 weeks (around 03/20/2015) for Recheck.

## 2015-02-20 NOTE — Assessment & Plan Note (Signed)
BP Readings from Last 3 Encounters:  02/20/15 136/73  02/02/15 134/61  01/22/15 168/85   BP well controlled. Renal function with labs next month. Continue current medications.

## 2015-02-20 NOTE — Assessment & Plan Note (Signed)
Recent insomnia. Will add Remeron  at bedtime. Follow up in 4 weeks and prn.

## 2015-02-20 NOTE — Patient Instructions (Signed)
Start Remeron  at bedtime to help with sleep and appetite.  Follow up in 4 weeks.

## 2015-02-20 NOTE — Assessment & Plan Note (Signed)
Mammogram ordered

## 2015-02-20 NOTE — Assessment & Plan Note (Signed)
Cholecystostomy tube functioning well. Will continue weekly drains with RN at [facility]. Plan to recheck LFTs with labs in 4 weeks.

## 2015-02-20 NOTE — Assessment & Plan Note (Signed)
Followed at [facility].

## 2015-03-08 ENCOUNTER — Encounter: Payer: Self-pay | Admitting: Internal Medicine

## 2015-03-08 ENCOUNTER — Ambulatory Visit (INDEPENDENT_AMBULATORY_CARE_PROVIDER_SITE_OTHER): Payer: Medicare Other | Admitting: Internal Medicine

## 2015-03-08 VITALS — BP 132/90 | HR 73 | Ht 63.0 in | Wt 105.2 lb

## 2015-03-08 DIAGNOSIS — I5032 Chronic diastolic (congestive) heart failure: Secondary | ICD-10-CM

## 2015-03-08 DIAGNOSIS — I482 Chronic atrial fibrillation, unspecified: Secondary | ICD-10-CM

## 2015-03-08 DIAGNOSIS — I10 Essential (primary) hypertension: Secondary | ICD-10-CM

## 2015-03-08 NOTE — Patient Instructions (Signed)
Medication Instructions: - Your physician recommends that you continue on your current medications as directed. Please refer to the Current Medication list given to you today.  Labwork: - none  Procedures/Testing: - none  Follow-Up: - Your physician recommends that you schedule a follow-up appointment in: 3-4 weeks with Dr. Graciela Husbands.  Any Additional Special Instructions Will Be Listed Below (If Applicable).     If you need a refill on your cardiac medications before your next appointment, please call your pharmacy.

## 2015-03-08 NOTE — Progress Notes (Signed)
Patient Care Team: Shelia Media, MD as PCP - General (Internal Medicine) Peter M Swaziland, MD (Cardiology) Hillis Range, MD (Cardiology) Sharrell Ku, MD (Gastroenterology) Melvenia Needles, MD (Ophthalmology) Cherlyn Roberts, MD (Dermatology) Sheran Luz, MD (Physical Medicine and Rehabilitation) Catha Gosselin, MD as Attending Physician (Family Medicine)   HPI  Laurie Larsen is a 82 y.o. female Seen with a previously implanted pacemaker in the context of permanent atrial fibrillation. . She saw Dr. Fawn Kirk 3/16. One-year follow-up was recommended. It was her desire to have pacemaker follow-up closer to home in [place].  She was hospitalized 5/16 [facility].  These records were reviewed. Echocardiogram 5/16 demonstrated normal ejection fraction she was diuresed and rate control was continued.  She continues with complaints of shortness of breath. She has some problems with peripheral edema and she adjusts her diuretics on an as-needed basis going from 60--80.  She actually spent the other day entirely in bed trying to mobilize fluid. She sees Dr. Swaziland frequently; his notes were reviewed            Past Medical History  Diagnosis Date  . Permanent atrial fibrillation (HCC)     a. Chronic Coumadin.  [name] HTN (hypertension)   . Chronic diastolic CHF (congestive heart failure), NYHA class 1 (HCC)     a. 04/2013 Echo: EF 50-55%, triv MR, mildly dil LA, PASP , mild TR, mild-mod RA dil.  [name] CAD (coronary artery disease)     a. 2005 Cath: 100% dLCX-->Med managed.  . Sick sinus syndrome (HCC)     a. 05/2008 s/p MDT EnRhythm DC PPM ([facility]).  . Chronic pain     a. ? due to arthritis  . Glaucoma   . Hyperlipidemia   . Diverticulitis large intestine   . Allergy   . Phlebitis     a. after pacemeker placement in 2010.  [name] Colon polyps   . Hypothyroidism   . Transfusion history     a. After childbirth 60 years ago (1957)  . Rash/skin eruption     a. Multiple episodes -  wide distribution-intermittent, possibly drug rash.  . Pulmonary HTN (HCC)     a. PASP on echo 04/2013.  [name] Chronic Right Pleural Effusion     a. 07/2012 s/p thoracentesis - Followed by Dr. Delford Field [facility].  . Arthritis   . Bronchitis   . Hay fever   . Colon polyps   . History of blood transfusion   . Urine incontinence   . Back complaints   . Acalculous cholecystitis     Past Surgical History  Procedure Laterality Date  . Appendectomy  1939  . Abdominal hysterectomy  1977  . Tonsillectomy  82 yrs old  . Kidney surgery  1955    Right  . Ankle fracture surgery  1979    Left, pin  . Pacemaker insertion  06/20/08    MDT EnRhythm DR implanted by Dr Reyes Ivan  . Breast surgery      Needle guided excision, right breast calcification  . Breast fibroadenoma surgery  2010    Right  . Skin graft      left leg  . Breast biopsy  2011    Current Outpatient Prescriptions  Medication Sig Dispense Refill  . acetaminophen (TYLENOL) 650 MG CR tablet Take 1,300 mg by mouth at bedtime as needed for pain.    [name] atenolol (TENORMIN) 25 MG tablet Take 1 tablet (25 mg total) by mouth 2 (two)  times daily. 180 tablet 3  . atorvastatin (LIPITOR) 40 MG tablet Take 0.5 tablets (20 mg total) by mouth at bedtime. 45 tablet 3  . diltiazem (CARDIZEM CD) 180 MG 24 hr capsule TAKE ONE CAPSULE DAILY 90 capsule 3  . dorzolamide-timolol (COSOPT) 22.3-6.8 MG/ML ophthalmic solution Place 1 drop into both eyes 2 (two) times daily.     . furosemide (LASIX) 40 MG tablet Take 60 mg by mouth every other day.  60 tablet 6  . furosemide (LASIX) 80 MG tablet Take 80 mg by mouth every other day. Alternate 60 mg with  every other day    . HYDROcodone-acetaminophen (NORCO) 10-325 MG tablet Take 1 tablet by mouth 3 (three) times daily as needed for moderate pain. 90 tablet 0  . levothyroxine (SYNTHROID, LEVOTHROID) 112 MCG tablet Take 1 tablet (112 mcg total) by mouth daily. 90 tablet 1  . mirtazapine  (REMERON) 15 MG tablet Take 1 tablet (15 mg total) by mouth at bedtime. 30 tablet 3  . polyethylene glycol (MIRALAX / GLYCOLAX) packet Take 17 g by mouth at bedtime.     . potassium chloride (K-DUR) 10 MEQ tablet Take 10 mEq by mouth 2 (two) times daily.    . traMADol (ULTRAM) 50 MG tablet Take 1 tablet (50 mg total) by mouth 3 (three) times daily as needed. 90 tablet 3  . Travoprost, BAK Free, (TRAVATAN) 0.004 % SOLN ophthalmic solution Place 1 drop into both eyes at bedtime.    . Vitamin D, Ergocalciferol, (DRISDOL) 50000 UNITS CAPS capsule Take 50,000 Units by mouth every 7 (seven) days. Pt takes on Saturday.    . warfarin (COUMADIN) 5 MG tablet TAKE AS DIRECTED BY COUMADIN CLINIC (Patient taking differently: Take 2.5-5 mg by mouth every evening. Pt takes 2.5mg  on Saturday and  all other days.) 90 tablet 1   No current facility-administered medications for this visit.    Allergies  Allergen Reactions  . Amoxicillin Other (See Comments)    Reaction:  Weakness  . Cetylpyridinium Other (See Comments)    weakness  . Digoxin Other (See Comments)    Reaction:  Weakness and dehydration  . Risedronate Sodium Rash    Review of Systems negative except from HPI and PMH  Physical Exam BP 132/90 mmHg  Pulse 73  Ht  (1.6 m)  Wt 105 lb 4 oz (47.741 kg)  BMI 18.65 kg/m2 Well developed and well nourished in no acute distress HENT normal E scleral and icterus clear Neck Supple JVP flat; carotids brisk and full Clear to ausculation Device pocket well healed; without hematoma or erythema.  There is no tethering  Irregularly irregular rate and rhythm, no murmurs gallops or rub Soft with active bowel sounds No clubbing cyanosis Trace Edema Alert and oriented, grossly normal motor and sensory function Skin Warm and Dry  ECG 4/16 demonstrated atrial fibrillation with a reasonably controlled ventricular response of 81  Assessment and  Plan  Atrial  fibrillation-permanent  Bradycardia  Pacemaker-Medtronic-single chamber The patient's device was interrogated.  The information was reviewed. No changes were made in the programming.     HFpEF  Hypertension    She continues to struggle with fluid and HFpEF.   As relates to her pacemaker, she was pacing at about 45% at a lower rate limit of 70. I have reprogrammed the device--45 today. We will plan to reassess in 2-3 weeks to see if she is pacing at that rate limit. Watching her for about 5 minutes today at  that rate she had 0 pacing  She expressed understanding and was agreeable.

## 2015-03-14 ENCOUNTER — Ambulatory Visit (INDEPENDENT_AMBULATORY_CARE_PROVIDER_SITE_OTHER): Payer: Medicare Other | Admitting: Cardiology

## 2015-03-14 ENCOUNTER — Encounter: Payer: Self-pay | Admitting: Cardiology

## 2015-03-14 VITALS — BP 104/62 | HR 76 | Ht 63.0 in | Wt 105.6 lb

## 2015-03-14 DIAGNOSIS — Z950 Presence of cardiac pacemaker: Secondary | ICD-10-CM | POA: Diagnosis not present

## 2015-03-14 DIAGNOSIS — Z7901 Long term (current) use of anticoagulants: Secondary | ICD-10-CM | POA: Diagnosis not present

## 2015-03-14 DIAGNOSIS — I4819 Other persistent atrial fibrillation: Secondary | ICD-10-CM

## 2015-03-14 DIAGNOSIS — I5032 Chronic diastolic (congestive) heart failure: Secondary | ICD-10-CM

## 2015-03-14 DIAGNOSIS — I481 Persistent atrial fibrillation: Secondary | ICD-10-CM | POA: Diagnosis not present

## 2015-03-14 NOTE — Progress Notes (Signed)
CARDIOLOGY OFFICE NOTE  Date:  03/14/2015    Laurie Larsen Date of Birth: 07/05/1923 Medical Record #[id number]  PCP:  Wynona Dove, MD  Cardiologist:  [place]    Chief Complaint  Patient presents with  . Follow-up    3 month//pt c/o constant SOB--some days worse than others, and swelling in bilateral legs/feet/ankles.     History of Present Illness: Laurie Larsen is a 82 y.o. female who presents today for  follow up diastolic CHF.  She has a hx of diastolic HF and chronic R pleural effusion, HLD, pulmonary HTN, chronic atrial fib, chronic coumadin therapy, HTN, CAD with known occlusion of a distal LCX branch from 2005, SSS with PPM in place, chronic pain, and hypothyroidism. She had pneumonia  in July of 2014 as well as right pleural effusion requiring thoracentesis - evaluation suggested a transudative process with some reactive mesothelial cells. Echo done in April 2015 showed normal LV and valvular function with moderate pulmonary HTN.    Admitted to [facility] in August 2015 with exacerbation of diastolic HF. She currently lives at [facility].    Admitted 05/31/14-06/04/14 with acute respiratory failure with hypoxemia and CHF. She was diuresed with excellent result.   This past fall she was admitted to [facility] hospital with acute acalculous cholecystitis. She presented with abdominal pain and fever. A percutaneous drain was placed and she was treated with antibiotics with resolution of symptoms. According to patient the plan is to leave this drain in long term.   On follow up today she is seen with her niece.  She notes that she went to a church social this week and had Chicken pot pie. Knows it was salty. She has gained weight and ankles are swollen today. Is planning to increase lasix now. She was recently seen by Dr. Graciela Husbands. Pacemaker battery is ERI. Programmed rate reduced to 45. To be seen again next month to see if she is being paced at all at this  level.    Past Medical History  Diagnosis Date  . Permanent atrial fibrillation (HCC)     a. Chronic Coumadin.  [name] HTN (hypertension)   . Chronic diastolic CHF (congestive heart failure), NYHA class 1 (HCC)     a. 04/2013 Echo: EF 50-55%, triv MR, mildly dil LA, PASP , mild TR, mild-mod RA dil.  [name] CAD (coronary artery disease)     a. 2005 Cath: 100% dLCX-->Med managed.  . Sick sinus syndrome (HCC)     a. 05/2008 s/p MDT EnRhythm DC PPM ([facility]).  . Chronic pain     a. ? due to arthritis  . Glaucoma   . Hyperlipidemia   . Diverticulitis large intestine   . Allergy   . Phlebitis     a. after pacemeker placement in 2010.  [name] Colon polyps   . Hypothyroidism   . Transfusion history     a. After childbirth 60 years ago (1957)  . Rash/skin eruption     a. Multiple episodes - wide distribution-intermittent, possibly drug rash.  . Pulmonary HTN (HCC)     a. PASP on echo 04/2013.  [name] Chronic Right Pleural Effusion     a. 07/2012 s/p thoracentesis - Followed by Dr. Delford Field [facility] Pulmonology.  . Arthritis   . Bronchitis   . Hay fever   . Colon polyps   . History of blood transfusion   . Urine incontinence   . Back complaints   . Acalculous cholecystitis  Past Surgical History  Procedure Laterality Date  . Appendectomy  1939  . Abdominal hysterectomy  1977  . Tonsillectomy  82 yrs old  . Kidney surgery  1955    Right  . Ankle fracture surgery  1979    Left, pin  . Pacemaker insertion  06/20/08    MDT EnRhythm DR implanted by Dr Reyes Ivan  . Breast surgery      Needle guided excision, right breast calcification  . Breast fibroadenoma surgery  2010    Right  . Skin graft      left leg  . Breast biopsy  2011     Medications: Current Outpatient Prescriptions  Medication Sig Dispense Refill  . acetaminophen (TYLENOL) 650 MG CR tablet Take 1,300 mg by mouth at bedtime as needed for pain.    [name] atenolol (TENORMIN) 25 MG tablet Take 1 tablet (25 mg total) by mouth  2 (two) times daily. 180 tablet 3  . atorvastatin (LIPITOR) 40 MG tablet Take 0.5 tablets (20 mg total) by mouth at bedtime. 45 tablet 3  . diltiazem (CARDIZEM CD) 180 MG 24 hr capsule TAKE ONE CAPSULE DAILY 90 capsule 3  . dorzolamide-timolol (COSOPT) 22.3-6.8 MG/ML ophthalmic solution Place 1 drop into both eyes 2 (two) times daily.     . furosemide (LASIX) 40 MG tablet Take 60 mg by mouth every other day.  60 tablet 6  . furosemide (LASIX) 80 MG tablet Take 80 mg by mouth every other day. Alternate 60 mg with 80mg  every other day    . HYDROcodone-acetaminophen (NORCO) 10-325 MG tablet Take 1 tablet by mouth 3 (three) times daily as needed for moderate pain. 90 tablet 0  . levothyroxine (SYNTHROID, LEVOTHROID) 112 MCG tablet Take 1 tablet (112 mcg total) by mouth daily. 90 tablet 1  . mirtazapine (REMERON) 15 MG tablet Take 1 tablet (15 mg total) by mouth at bedtime. 30 tablet 3  . polyethylene glycol (MIRALAX / GLYCOLAX) packet Take 17 g by mouth at bedtime.     . potassium chloride (K-DUR) 10 MEQ tablet Take 10 mEq by mouth 2 (two) times daily.    . traMADol (ULTRAM) 50 MG tablet Take 1 tablet (50 mg total) by mouth 3 (three) times daily as needed. 90 tablet 3  . Travoprost, BAK Free, (TRAVATAN) 0.004 % SOLN ophthalmic solution Place 1 drop into both eyes at bedtime.    . Vitamin D, Ergocalciferol, (DRISDOL) 50000 UNITS CAPS capsule Take 50,000 Units by mouth every 7 (seven) days. Pt takes on Saturday.    . warfarin (COUMADIN) 5 MG tablet TAKE AS DIRECTED BY COUMADIN CLINIC (Patient taking differently: Take 2.5-5 mg by mouth every evening. Pt takes 2.5mg  on Saturday and 5mg  all other days.) 90 tablet 1   No current facility-administered medications for this visit.    Allergies: Allergies  Allergen Reactions  . Amoxicillin Other (See Comments)    Reaction:  Weakness  . Cetylpyridinium Other (See Comments)    weakness  . Digoxin Other (See Comments)    Reaction:  Weakness and dehydration   . Risedronate Sodium Rash    Social History: The patient  reports that she has never smoked. She has never used smokeless tobacco. She reports that she does not drink alcohol or use illicit drugs.   Family History: The patient's family history includes Cancer in her cousin, mother, and paternal aunt; Diabetes in her mother; Heart attack in her brother and father; Heart disease in her father; Hyperlipidemia in her father;  Hypertension in her father; Stroke in her brother.   Review of Systems: Please see the history of present illness.      All other systems are reviewed and negative.   Physical Exam: VS:  BP 104/62 mmHg  Pulse 76  Ht  (1.6 m)  Wt 47.9 kg (105 lb 9.6 oz)  BMI 18.71 kg/m2 .  BMI Body mass index is 18.71 kg/(m^2).  Wt Readings from Last 3 Encounters:  03/14/15 47.9 kg (105 lb 9.6 oz)  03/08/15 47.741 kg (105 lb 4 oz)  02/20/15 45.813 kg (101 lb)    General: Pleasant. Chronically ill. Quite kyphotic but in no acute distress.  HEENT: Normal. Neck: Supple, no JVD, carotid bruits, or masses noted.  Cardiac: Irregular irregular rhythm. Rate is ok. No murmurs, rubs, or gallops. No edema.  Respiratory:  Lungs are clear to auscultation bilaterally with normal work of breathing.  GI: Soft and nontender.  MS: No deformity or atrophy. Gait and ROM intact.Using a cane.  Skin: Warm and dry. Color is normal.  Neuro:  Strength and sensation are intact and no gross focal deficits noted.  Psych: Alert, appropriate and with normal affect.   LABORATORY DATA:  EKG:  EKG is not ordered today.   Lab Results  Component Value Date   WBC 7.8 01/22/2015   HGB 11.9* 01/22/2015   HCT 35.2 01/22/2015   PLT 201 01/22/2015   GLUCOSE 105* 01/22/2015   CHOL 117 06/01/2014   TRIG 68 06/01/2014   HDL 43 06/01/2014   LDLCALC 60 06/01/2014   ALT 21 01/22/2015   AST 28 01/22/2015   NA 137 01/22/2015   K 4.0 01/22/2015   CL 107 01/22/2015   CREATININE 0.61 01/22/2015   BUN 23*  01/22/2015   CO2 24 01/22/2015   TSH 2.221 11/03/2014   INR 2.8* 02/06/2015    BNP (last 3 results)  Recent Labs  05/31/14 1255 01/22/15 1233  BNP 1124.0* 1232.0*    ProBNP (last 3 results)  Recent Labs  03/21/14 1500  PROBNP 844.0*     Other Studies Reviewed Today: CXR 01/22/15 showed small pleural effusions otherwise NAD.    Assessment/Plan: 1. Chronic diastolic HF - weight and swelling up today with dietary indiscretion.  She understands to take extra lasix if weight increases or she has increased dyspnea. stressed importance of taking her medications daily and avoiding salt. Will follow up in 3 months.   2. Chronic atrial fibrillation: Rate controlled. Continue coumadin which she is tolerating.   3. Pulmonary HTN  4. Essential hypertension, benign: BP controlled on current regimen which includes Atenolol, Diltiazem.   5. 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 Now ERI. Will see how much she is pacing at rate of 45 to determine suitability of replacement.  6. CAD - continue with medical management  7. HLD (hyperlipidemia): on statin therapy  8. Acalculous cholecystitis. Symptoms resolved. Chronic drain in place.  Current medicines are reviewed with the patient today.  The patient does not have concerns regarding medicines other than what has been noted above.  The following changes have been made:  See above.  Labs/ tests ordered today include:    No orders of the defined types were placed in this encounter.    Disposition:   FU with Dr. [place] 3 months  Patient is agreeable to this plan and will call if any problems develop in the interim.   Signed: Peter [place] MD, [name]  03/14/2015 11:33 AM

## 2015-03-14 NOTE — Patient Instructions (Signed)
Continue your current therapy   I will see you in 3 months. 

## 2015-03-15 LAB — PROTIME-INR: INR: 2.2 — AB (ref 0.9–1.1)

## 2015-03-16 ENCOUNTER — Ambulatory Visit (INDEPENDENT_AMBULATORY_CARE_PROVIDER_SITE_OTHER): Payer: Medicare Other | Admitting: Internal Medicine

## 2015-03-16 DIAGNOSIS — I481 Persistent atrial fibrillation: Secondary | ICD-10-CM

## 2015-03-16 DIAGNOSIS — I4819 Other persistent atrial fibrillation: Secondary | ICD-10-CM

## 2015-03-21 ENCOUNTER — Other Ambulatory Visit: Payer: Self-pay | Admitting: Cardiology

## 2015-03-21 ENCOUNTER — Encounter: Payer: Self-pay | Admitting: Internal Medicine

## 2015-03-21 ENCOUNTER — Ambulatory Visit (INDEPENDENT_AMBULATORY_CARE_PROVIDER_SITE_OTHER): Payer: Medicare Other | Admitting: Internal Medicine

## 2015-03-21 VITALS — BP 107/81 | HR 88 | Temp 98.4°F | Ht 63.0 in | Wt 101.0 lb

## 2015-03-21 DIAGNOSIS — G894 Chronic pain syndrome: Secondary | ICD-10-CM

## 2015-03-21 DIAGNOSIS — I10 Essential (primary) hypertension: Secondary | ICD-10-CM

## 2015-03-21 DIAGNOSIS — R509 Fever, unspecified: Secondary | ICD-10-CM | POA: Diagnosis not present

## 2015-03-21 LAB — CBC WITH DIFFERENTIAL/PLATELET
BASOS ABS: 0 10*3/uL (ref 0.0–0.1)
BASOS PCT: 0.4 % (ref 0.0–3.0)
Eosinophils Absolute: 0 10*3/uL (ref 0.0–0.7)
Eosinophils Relative: 0.3 % (ref 0.0–5.0)
HEMATOCRIT: 38 % (ref 36.0–46.0)
HEMOGLOBIN: 12.9 g/dL (ref 12.0–15.0)
LYMPHS PCT: 4.7 % — AB (ref 12.0–46.0)
Lymphs Abs: 0.3 10*3/uL — ABNORMAL LOW (ref 0.7–4.0)
MCHC: 33.8 g/dL (ref 30.0–36.0)
MCV: 89.6 fl (ref 78.0–100.0)
MONOS PCT: 15.4 % — AB (ref 3.0–12.0)
Monocytes Absolute: 0.8 10*3/uL (ref 0.1–1.0)
NEUTROS ABS: 4.2 10*3/uL (ref 1.4–7.7)
Neutrophils Relative %: 79.2 % — ABNORMAL HIGH (ref 43.0–77.0)
RBC: 4.24 Mil/uL (ref 3.87–5.11)
RDW: 15.2 % (ref 11.5–15.5)
WBC: 5.4 10*3/uL (ref 4.0–10.5)

## 2015-03-21 LAB — COMPREHENSIVE METABOLIC PANEL
ALBUMIN: 4 g/dL (ref 3.5–5.2)
ALK PHOS: 103 U/L (ref 39–117)
ALT: 15 U/L (ref 0–35)
AST: 23 U/L (ref 0–37)
BILIRUBIN TOTAL: 0.6 mg/dL (ref 0.2–1.2)
BUN: 22 mg/dL (ref 6–23)
CALCIUM: 8.7 mg/dL (ref 8.4–10.5)
CO2: 26 meq/L (ref 19–32)
Chloride: 104 mEq/L (ref 96–112)
Creatinine, Ser: 0.89 mg/dL (ref 0.40–1.20)
GFR: 63.33 mL/min (ref >60.00)
Glucose, Bld: 121 mg/dL — ABNORMAL HIGH (ref 70–99)
Potassium: 4 mEq/L (ref 3.5–5.1)
Sodium: 138 mEq/L (ref 135–145)
Total Protein: 6.9 g/dL (ref 6.0–8.3)

## 2015-03-21 LAB — POCT INFLUENZA A/B
INFLUENZA A, POC: NEGATIVE
INFLUENZA B, POC: NEGATIVE

## 2015-03-21 MED ORDER — HYDROCODONE-ACETAMINOPHEN 10-325 MG PO TABS: 1.0000 | ORAL_TABLET | Freq: Three times a day (TID) | ORAL | Status: DC | PRN

## 2015-03-21 NOTE — Patient Instructions (Addendum)
Labs today. We will call you with the results.  Follow up on Monday.

## 2015-03-21 NOTE — Addendum Note (Signed)
Addended by: Marchia Meiers on: 03/21/2015 02:50 PM   Modules accepted: Orders

## 2015-03-21 NOTE — Progress Notes (Signed)
Subjective:    Patient ID: Laurie Larsen, female    DOB: Jan 15, 1924, 82 y.o.   MRN: [id number]  HPI  82YO female presents for follow up.  Not feeling well. Some cough, mid chest pain, yesterday. Sore throat and burning with swallowing. Fever last night 104F by LPN. Felt sore all over. Some chills. Mild nausea with cough. No vomiting. No diarrhea. No abdominal pain. Symptoms improved some today. Continues to feel fatigued. No dyspnea today. Not taking anything for symptoms. Several sick contacts with flu at her ALF.  Wt Readings from Last 3 Encounters:  03/21/15 101 lb (45.813 kg)  03/14/15 105 lb 9.6 oz (47.9 kg)  03/08/15 105 lb 4 oz (47.741 kg)   BP Readings from Last 3 Encounters:  03/21/15 107/81  03/14/15 104/62  03/08/15 132/90    Past Medical History  Diagnosis Date  . Permanent atrial fibrillation (HCC)     a. Chronic Coumadin.  [name] HTN (hypertension)   . Chronic diastolic CHF (congestive heart failure), NYHA class 1 (HCC)     a. 04/2013 Echo: EF 50-55%, triv MR, mildly dil LA, PASP , mild TR, mild-mod RA dil.  [name] CAD (coronary artery disease)     a. 2005 Cath: 100% dLCX-->Med managed.  . Sick sinus syndrome (HCC)     a. 05/2008 s/p MDT EnRhythm DC PPM ([facility]).  . Chronic pain     a. ? due to arthritis  . Glaucoma   . Hyperlipidemia   . Diverticulitis large intestine   . Allergy   . Phlebitis     a. after pacemeker placement in 2010.  [name] Colon polyps   . Hypothyroidism   . Transfusion history     a. After childbirth 60 years ago (1957)  . Rash/skin eruption     a. Multiple episodes - wide distribution-intermittent, possibly drug rash.  . Pulmonary HTN (HCC)     a. PASP on echo 04/2013.  [name] Chronic Right Pleural Effusion     a. 07/2012 s/p thoracentesis - Followed by Dr. Delford Field [facility] Pulmonology.  . Arthritis   . Bronchitis   . Hay fever   . Colon polyps   . History of blood transfusion   . Urine incontinence   . Back complaints   .  Acalculous cholecystitis    Family History  Problem Relation Age of Onset  . Cancer Mother     colon with mets  . Diabetes Mother   . Heart disease Father   . Hyperlipidemia Father   . Hypertension Father   . Heart attack Father   . Stroke Brother     brother who died 06/22/22  . Cancer Paternal Aunt     breast  . Cancer Cousin     uterine, lung cancer  . Heart attack Brother    Past Surgical History  Procedure Laterality Date  . Appendectomy  1939  . Abdominal hysterectomy  1977  . Tonsillectomy  82 yrs old  . Kidney surgery  1955    Right  . Ankle fracture surgery  1979    Left, pin  . Pacemaker insertion  06/20/08    MDT EnRhythm DR implanted by Dr Reyes Ivan  . Breast surgery      Needle guided excision, right breast calcification  . Breast fibroadenoma surgery  2010    Right  . Skin graft      left leg  . Breast biopsy  2011   Social History   Social History  .  Marital Status: Married    Spouse Name: Lorella Nimrod  . Number of Children: 3  . Years of Education: 13   Occupational History  . RETIRED    Social History Main Topics  . Smoking status: Never Smoker   . Smokeless tobacco: Never Used  . Alcohol Use: No  . Drug Use: No  . Sexual Activity: No   Other Topics Concern  . None   Social History Narrative   Lives with husband in [place]. Three sons.      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     Married 1948.    3 sons - 06/23/50, 06/23/2055, Jun 22, 2056; 5 grandchildren-one deceased -  OD @ 34.        End of Life Care: no prolonged heroic measures, i.e. Artificial feeding or hydration; DNR; no prolonged intubation.    Review of Systems  Constitutional: Positive for fever, chills and fatigue. Negative for appetite change and unexpected weight change.  HENT: Positive for congestion, rhinorrhea and sore throat. Negative for sneezing and trouble swallowing.   Eyes: Negative for visual disturbance.    Respiratory: Positive for cough. Negative for shortness of breath.   Cardiovascular: Negative for chest pain, palpitations and leg swelling.  Gastrointestinal: Negative for nausea, vomiting, abdominal pain, diarrhea and constipation.  Skin: Negative for color change and rash.  Hematological: Negative for adenopathy. Does not bruise/bleed easily.  Psychiatric/Behavioral: Negative for sleep disturbance and dysphoric mood. The patient is not nervous/anxious.        Objective:    BP 107/81 mmHg  Pulse 88  Temp(Src) 98.4 F (36.9 C) (Oral)  Ht  (1.6 m)  Wt 101 lb (45.813 kg)  BMI 17.90 kg/m2  SpO2 91% Physical Exam  Constitutional: She is oriented to person, place, and time. She appears well-developed and well-nourished. No distress.  HENT:  Head: Normocephalic and atraumatic.  Right Ear: External ear normal.  Left Ear: External ear normal.  Nose: Nose normal.  Mouth/Throat: Oropharynx is clear and moist. No oropharyngeal exudate.  Eyes: Conjunctivae are normal. Pupils are equal, round, and reactive to light. Right eye exhibits no discharge. Left eye exhibits no discharge. No scleral icterus.  Neck: Normal range of motion. Neck supple. No tracheal deviation present. No thyromegaly present.  Cardiovascular: Normal rate, regular rhythm, normal heart sounds and intact distal pulses.  Exam reveals no gallop and no friction rub.   No murmur heard. Pulmonary/Chest: Effort normal and breath sounds normal. No accessory muscle usage. No tachypnea. No respiratory distress. She has no decreased breath sounds. She has no wheezes. She has no rhonchi. She has no rales. She exhibits no tenderness.  Musculoskeletal: Normal range of motion. She exhibits no edema or tenderness.  Lymphadenopathy:    She has no cervical adenopathy.  Neurological: She is alert and oriented to person, place, and time. No cranial nerve deficit. She exhibits normal muscle tone. Coordination normal.  Skin: Skin is warm  and dry. No rash noted. She is not diaphoretic. No erythema. No pallor.  Psychiatric: She has a normal mood and affect. Her behavior is normal. Judgment and thought content normal.          Assessment & Plan:   Problem List Items Addressed This Visit      Unprioritized   Essential hypertension, benign    BP Readings from Last 3 Encounters:  03/21/15 107/81  03/14/15 104/62  03/08/15 132/90  BP well controlled. Renal function with labs.      Pyrexia - Primary    Recent fever, chills, cough. Exam normal today. Rapid flu neg. Encouraged rest, fluids. Tylenol prn. Will check CBC and CMP with labs. Follow up Monday or sooner if symptoms not improving.      Relevant Orders   CBC with Differential/Platelet   Comprehensive metabolic panel       Return in about 5 days (around 03/26/2015) for Recheck.  Ronna Polio, MD Internal Medicine [name]

## 2015-03-21 NOTE — Progress Notes (Signed)
Pre visit review using our clinic review tool, if applicable. No additional management support is needed unless otherwise documented below in the visit note. 

## 2015-03-21 NOTE — Assessment & Plan Note (Signed)
Symptoms well controlled with prn Hydrocodone. Will continue.

## 2015-03-21 NOTE — Assessment & Plan Note (Signed)
BP Readings from Last 3 Encounters:  03/21/15 107/81  03/14/15 104/62  03/08/15 132/90   BP well controlled. Renal function with labs.

## 2015-03-21 NOTE — Assessment & Plan Note (Signed)
Recent fever, chills, cough. Exam normal today. Rapid flu neg. Encouraged rest, fluids. Tylenol prn. Will check CBC and CMP with labs. Follow up Monday or sooner if symptoms not improving.

## 2015-03-22 ENCOUNTER — Encounter: Payer: Self-pay | Admitting: Internal Medicine

## 2015-03-23 ENCOUNTER — Telehealth: Payer: Self-pay | Admitting: Internal Medicine

## 2015-03-23 NOTE — Telephone Encounter (Signed)
Pt had a message to call office. She believes the call was for lab results, she is not sure. Please call pt at home.

## 2015-03-25 ENCOUNTER — Emergency Department: Payer: Medicare Other

## 2015-03-25 ENCOUNTER — Encounter: Payer: Self-pay | Admitting: Emergency Medicine

## 2015-03-25 ENCOUNTER — Inpatient Hospital Stay
Admission: EM | Admit: 2015-03-25 | Discharge: 2015-03-29 | DRG: 193 | Disposition: A | Discharge status: Home / Self Care | Payer: Medicare Other | Attending: Internal Medicine | Admitting: Internal Medicine

## 2015-03-25 DIAGNOSIS — I482 Chronic atrial fibrillation: Secondary | ICD-10-CM | POA: Diagnosis not present

## 2015-03-25 DIAGNOSIS — Z90710 Acquired absence of both cervix and uterus: Secondary | ICD-10-CM

## 2015-03-25 DIAGNOSIS — Z9981 Dependence on supplemental oxygen: Secondary | ICD-10-CM | POA: Diagnosis not present

## 2015-03-25 DIAGNOSIS — Z888 Allergy status to other drugs, medicaments and biological substances status: Secondary | ICD-10-CM

## 2015-03-25 DIAGNOSIS — Z808 Family history of malignant neoplasm of other organs or systems: Secondary | ICD-10-CM

## 2015-03-25 DIAGNOSIS — I272 Other secondary pulmonary hypertension: Secondary | ICD-10-CM | POA: Diagnosis present

## 2015-03-25 DIAGNOSIS — I959 Hypotension, unspecified: Secondary | ICD-10-CM | POA: Diagnosis present

## 2015-03-25 DIAGNOSIS — I10 Essential (primary) hypertension: Secondary | ICD-10-CM | POA: Diagnosis present

## 2015-03-25 DIAGNOSIS — R0902 Hypoxemia: Secondary | ICD-10-CM | POA: Diagnosis present

## 2015-03-25 DIAGNOSIS — Z9049 Acquired absence of other specified parts of digestive tract: Secondary | ICD-10-CM | POA: Diagnosis not present

## 2015-03-25 DIAGNOSIS — J101 Influenza due to other identified influenza virus with other respiratory manifestations: Secondary | ICD-10-CM | POA: Diagnosis present

## 2015-03-25 DIAGNOSIS — Z801 Family history of malignant neoplasm of trachea, bronchus and lung: Secondary | ICD-10-CM | POA: Diagnosis not present

## 2015-03-25 DIAGNOSIS — Z79899 Other long term (current) drug therapy: Secondary | ICD-10-CM

## 2015-03-25 DIAGNOSIS — I5033 Acute on chronic diastolic (congestive) heart failure: Secondary | ICD-10-CM | POA: Diagnosis present

## 2015-03-25 DIAGNOSIS — B961 Klebsiella pneumoniae [K. pneumoniae] as the cause of diseases classified elsewhere: Secondary | ICD-10-CM | POA: Diagnosis present

## 2015-03-25 DIAGNOSIS — Z7901 Long term (current) use of anticoagulants: Secondary | ICD-10-CM | POA: Diagnosis not present

## 2015-03-25 DIAGNOSIS — Z880 Allergy status to penicillin: Secondary | ICD-10-CM | POA: Diagnosis not present

## 2015-03-25 DIAGNOSIS — J9601 Acute respiratory failure with hypoxia: Secondary | ICD-10-CM | POA: Diagnosis present

## 2015-03-25 DIAGNOSIS — Z8249 Family history of ischemic heart disease and other diseases of the circulatory system: Secondary | ICD-10-CM

## 2015-03-25 DIAGNOSIS — R0602 Shortness of breath: Secondary | ICD-10-CM

## 2015-03-25 DIAGNOSIS — H409 Unspecified glaucoma: Secondary | ICD-10-CM | POA: Diagnosis present

## 2015-03-25 DIAGNOSIS — J9600 Acute respiratory failure, unspecified whether with hypoxia or hypercapnia: Secondary | ICD-10-CM | POA: Diagnosis present

## 2015-03-25 DIAGNOSIS — J90 Pleural effusion, not elsewhere classified: Secondary | ICD-10-CM

## 2015-03-25 DIAGNOSIS — Z66 Do not resuscitate: Secondary | ICD-10-CM | POA: Diagnosis present

## 2015-03-25 DIAGNOSIS — I2510 Atherosclerotic heart disease of native coronary artery without angina pectoris: Secondary | ICD-10-CM | POA: Diagnosis present

## 2015-03-25 DIAGNOSIS — I071 Rheumatic tricuspid insufficiency: Secondary | ICD-10-CM | POA: Diagnosis present

## 2015-03-25 DIAGNOSIS — I5032 Chronic diastolic (congestive) heart failure: Secondary | ICD-10-CM | POA: Diagnosis present

## 2015-03-25 DIAGNOSIS — Z833 Family history of diabetes mellitus: Secondary | ICD-10-CM | POA: Diagnosis not present

## 2015-03-25 DIAGNOSIS — Z9889 Other specified postprocedural states: Secondary | ICD-10-CM

## 2015-03-25 DIAGNOSIS — I4891 Unspecified atrial fibrillation: Secondary | ICD-10-CM | POA: Diagnosis present

## 2015-03-25 DIAGNOSIS — I495 Sick sinus syndrome: Secondary | ICD-10-CM | POA: Diagnosis present

## 2015-03-25 DIAGNOSIS — J1000 Influenza due to other identified influenza virus with unspecified type of pneumonia: Secondary | ICD-10-CM | POA: Diagnosis present

## 2015-03-25 DIAGNOSIS — Z950 Presence of cardiac pacemaker: Secondary | ICD-10-CM | POA: Diagnosis present

## 2015-03-25 DIAGNOSIS — E440 Moderate protein-calorie malnutrition: Secondary | ICD-10-CM | POA: Insufficient documentation

## 2015-03-25 DIAGNOSIS — I509 Heart failure, unspecified: Secondary | ICD-10-CM

## 2015-03-25 DIAGNOSIS — Z823 Family history of stroke: Secondary | ICD-10-CM | POA: Diagnosis not present

## 2015-03-25 DIAGNOSIS — J9690 Respiratory failure, unspecified, unspecified whether with hypoxia or hypercapnia: Secondary | ICD-10-CM

## 2015-03-25 LAB — COMPREHENSIVE METABOLIC PANEL WITH GFR
ALT: 19 U/L (ref 14–54)
AST: 32 U/L (ref 15–41)
Albumin: 4 g/dL (ref 3.5–5.0)
Alkaline Phosphatase: 91 U/L (ref 38–126)
Anion gap: 9 (ref 5–15)
BUN: 16 mg/dL (ref 6–20)
CO2: 22 mmol/L (ref 22–32)
Calcium: 8.4 mg/dL — ABNORMAL LOW (ref 8.9–10.3)
Chloride: 106 mmol/L (ref 101–111)
Creatinine, Ser: 0.66 mg/dL (ref 0.44–1.00)
GFR calc Af Amer: >60 mL/min
GFR calc non Af Amer: >60 mL/min
Glucose, Bld: 101 mg/dL — ABNORMAL HIGH (ref 65–99)
Potassium: 3.9 mmol/L (ref 3.5–5.1)
Sodium: 137 mmol/L (ref 135–145)
Total Bilirubin: 0.6 mg/dL (ref 0.3–1.2)
Total Protein: 7.3 g/dL (ref 6.5–8.1)

## 2015-03-25 LAB — URINALYSIS COMPLETE WITH MICROSCOPIC (ARMC ONLY)
BILIRUBIN URINE: NEGATIVE
Glucose, UA: NEGATIVE mg/dL
KETONES UR: NEGATIVE mg/dL
Leukocytes, UA: NEGATIVE
Nitrite: NEGATIVE
PROTEIN: NEGATIVE mg/dL
RBC / HPF: NONE SEEN RBC/hpf (ref 0–5)
Specific Gravity, Urine: 1.004 — ABNORMAL LOW (ref 1.005–1.030)
Squamous Epithelial / LPF: NONE SEEN
WBC, UA: NONE SEEN WBC/hpf (ref 0–5)
pH: 7 (ref 5.0–8.0)

## 2015-03-25 LAB — CBC WITH DIFFERENTIAL/PLATELET
Basophils Absolute: 0.1 10*3/uL (ref 0–0.1)
Basophils Relative: 1 %
Eosinophils Absolute: 0.1 10*3/uL (ref 0–0.7)
Eosinophils Relative: 1 %
HCT: 40.5 % (ref 35.0–47.0)
Hemoglobin: 13.7 g/dL (ref 12.0–16.0)
Lymphocytes Relative: 6 %
Lymphs Abs: 0.5 10*3/uL — ABNORMAL LOW (ref 1.0–3.6)
MCH: 30.4 pg (ref 26.0–34.0)
MCHC: 33.7 g/dL (ref 32.0–36.0)
MCV: 90 fL (ref 80.0–100.0)
Monocytes Absolute: 0.5 10*3/uL (ref 0.2–0.9)
Monocytes Relative: 6 %
Neutro Abs: 7 10*3/uL — ABNORMAL HIGH (ref 1.4–6.5)
Neutrophils Relative %: 86 %
Platelets: 123 10*3/uL — ABNORMAL LOW (ref 150–440)
RBC: 4.5 MIL/uL (ref 3.80–5.20)
RDW: 15.1 % — ABNORMAL HIGH (ref 11.5–14.5)
WBC: 8.2 10*3/uL (ref 3.6–11.0)

## 2015-03-25 LAB — TROPONIN I
Troponin I: <0.03 ng/mL (ref <0.031)
Troponin I: 0.04 ng/mL — ABNORMAL HIGH (ref <0.031)
Troponin I: 0.05 ng/mL — ABNORMAL HIGH (ref <0.031)

## 2015-03-25 LAB — LIPASE, BLOOD: LIPASE: 27 U/L (ref 11–51)

## 2015-03-25 LAB — MRSA PCR SCREENING: MRSA BY PCR: NEGATIVE

## 2015-03-25 LAB — INFLUENZA PANEL BY PCR (TYPE A & B)
H1N1 flu by pcr: NOT DETECTED
INFLAPCR: POSITIVE — AB
INFLBPCR: NEGATIVE

## 2015-03-25 LAB — PROTIME-INR
INR: 2.63
PROTHROMBIN TIME: 27.7 s — AB (ref 11.4–15.0)

## 2015-03-25 LAB — LACTIC ACID, PLASMA: LACTIC ACID, VENOUS: 1.2 mmol/L (ref 0.5–2.0)

## 2015-03-25 LAB — BRAIN NATRIURETIC PEPTIDE: B NATRIURETIC PEPTIDE 5: 1204 pg/mL — AB (ref 0.0–100.0)

## 2015-03-25 MED ORDER — WARFARIN - PHYSICIAN DOSING INPATIENT: Freq: Every day | Status: DC

## 2015-03-25 MED ORDER — HYDROCODONE-ACETAMINOPHEN 10-325 MG PO TABS
1.0000 | ORAL_TABLET | Freq: Three times a day (TID) | ORAL | Status: DC | PRN
  Administered 2015-03-25 – 2015-03-29 (×6): 1 via ORAL
  Filled 2015-03-25 (×7): qty 1

## 2015-03-25 MED ORDER — TRAMADOL HCL 50 MG PO TABS
50.0000 mg | ORAL_TABLET | Freq: Three times a day (TID) | ORAL | Status: DC | PRN
  Administered 2015-03-25: 50 mg via ORAL
  Filled 2015-03-25: qty 1

## 2015-03-25 MED ORDER — SODIUM CHLORIDE 0.9% FLUSH
3.0000 mL | Freq: Two times a day (BID) | INTRAVENOUS | Status: DC
  Administered 2015-03-25 – 2015-03-29 (×8): 3 mL via INTRAVENOUS

## 2015-03-25 MED ORDER — DOCUSATE SODIUM 100 MG PO CAPS
100.0000 mg | ORAL_CAPSULE | Freq: Two times a day (BID) | ORAL | Status: DC
  Administered 2015-03-25 – 2015-03-29 (×9): 100 mg via ORAL
  Filled 2015-03-25 (×9): qty 1

## 2015-03-25 MED ORDER — ACETAMINOPHEN 650 MG RE SUPP: 650.0000 mg | Freq: Four times a day (QID) | RECTAL | Status: DC | PRN

## 2015-03-25 MED ORDER — ATENOLOL 25 MG PO TABS
25.0000 mg | ORAL_TABLET | Freq: Two times a day (BID) | ORAL | Status: DC
  Administered 2015-03-25 – 2015-03-29 (×8): 25 mg via ORAL
  Filled 2015-03-25 (×8): qty 1

## 2015-03-25 MED ORDER — SODIUM CHLORIDE 0.9 % IV SOLN: 250.0000 mL | INTRAVENOUS | Status: DC | PRN

## 2015-03-25 MED ORDER — SODIUM CHLORIDE 0.9% FLUSH
3.0000 mL | Freq: Two times a day (BID) | INTRAVENOUS | Status: DC
  Administered 2015-03-25 – 2015-03-29 (×6): 3 mL via INTRAVENOUS

## 2015-03-25 MED ORDER — ONDANSETRON HCL 4 MG/2ML IJ SOLN
4.0000 mg | Freq: Four times a day (QID) | INTRAMUSCULAR | Status: DC | PRN
  Administered 2015-03-28: 4 mg via INTRAVENOUS
  Filled 2015-03-25: qty 2

## 2015-03-25 MED ORDER — NITROGLYCERIN 2 % TD OINT
1.0000 [in_us] | TOPICAL_OINTMENT | Freq: Once | TRANSDERMAL | Status: AC
  Administered 2015-03-25: 1 [in_us] via TOPICAL
  Filled 2015-03-25: qty 1

## 2015-03-25 MED ORDER — LATANOPROST 0.005 % OP SOLN
1.0000 [drp] | Freq: Every day | OPHTHALMIC | Status: DC
  Administered 2015-03-25 – 2015-03-28 (×4): 1 [drp] via OPHTHALMIC
  Filled 2015-03-25 (×2): qty 2.5

## 2015-03-25 MED ORDER — FUROSEMIDE 10 MG/ML IJ SOLN: 40.0000 mg | Freq: Two times a day (BID) | INTRAMUSCULAR | Status: DC

## 2015-03-25 MED ORDER — MIRTAZAPINE 15 MG PO TABS
15.0000 mg | ORAL_TABLET | Freq: Every day | ORAL | Status: DC
  Administered 2015-03-25 – 2015-03-28 (×4): 15 mg via ORAL
  Filled 2015-03-25 (×4): qty 1

## 2015-03-25 MED ORDER — ONDANSETRON HCL 4 MG PO TABS: 4.0000 mg | ORAL_TABLET | Freq: Four times a day (QID) | ORAL | Status: DC | PRN

## 2015-03-25 MED ORDER — DOXYCYCLINE HYCLATE 100 MG PO TABS
100.0000 mg | ORAL_TABLET | Freq: Two times a day (BID) | ORAL | Status: DC
  Administered 2015-03-25 – 2015-03-29 (×9): 100 mg via ORAL
  Filled 2015-03-25 (×9): qty 1

## 2015-03-25 MED ORDER — WARFARIN SODIUM 1 MG PO TABS
5.0000 mg | ORAL_TABLET | Freq: Every day | ORAL | Status: DC
  Administered 2015-03-25: 5 mg via ORAL
  Filled 2015-03-25: qty 5

## 2015-03-25 MED ORDER — HYDROCODONE-ACETAMINOPHEN 5-325 MG PO TABS
ORAL_TABLET | ORAL | Status: AC
  Administered 2015-03-25: 1 via ORAL
  Filled 2015-03-25: qty 1

## 2015-03-25 MED ORDER — SODIUM CHLORIDE 0.9% FLUSH: 3.0000 mL | INTRAVENOUS | Status: DC | PRN

## 2015-03-25 MED ORDER — POLYETHYLENE GLYCOL 3350 17 G PO PACK
17.0000 g | PACK | Freq: Every day | ORAL | Status: DC
  Administered 2015-03-25 – 2015-03-28 (×4): 17 g via ORAL
  Filled 2015-03-25 (×4): qty 1

## 2015-03-25 MED ORDER — OSELTAMIVIR PHOSPHATE 30 MG PO CAPS
30.0000 mg | ORAL_CAPSULE | Freq: Two times a day (BID) | ORAL | Status: DC
  Administered 2015-03-25 – 2015-03-29 (×9): 30 mg via ORAL
  Filled 2015-03-25 (×11): qty 1

## 2015-03-25 MED ORDER — POTASSIUM CHLORIDE CRYS ER 10 MEQ PO TBCR
10.0000 meq | EXTENDED_RELEASE_TABLET | Freq: Two times a day (BID) | ORAL | Status: DC
  Administered 2015-03-25 – 2015-03-29 (×9): 10 meq via ORAL
  Filled 2015-03-25 (×13): qty 1

## 2015-03-25 MED ORDER — FUROSEMIDE 10 MG/ML IJ SOLN
60.0000 mg | Freq: Once | INTRAMUSCULAR | Status: AC
  Administered 2015-03-25: 60 mg via INTRAVENOUS
  Filled 2015-03-25: qty 8

## 2015-03-25 MED ORDER — ATORVASTATIN CALCIUM 10 MG PO TABS
20.0000 mg | ORAL_TABLET | Freq: Every day | ORAL | Status: DC
  Administered 2015-03-25 – 2015-03-28 (×4): 20 mg via ORAL
  Filled 2015-03-25: qty 1
  Filled 2015-03-25 (×3): qty 2

## 2015-03-25 MED ORDER — LEVOTHYROXINE SODIUM 112 MCG PO TABS
112.0000 ug | ORAL_TABLET | Freq: Every day | ORAL | Status: DC
  Administered 2015-03-26 – 2015-03-29 (×4): 112 ug via ORAL
  Filled 2015-03-25 (×4): qty 1

## 2015-03-25 MED ORDER — ONDANSETRON HCL 4 MG/2ML IJ SOLN
INTRAMUSCULAR | Status: AC
  Administered 2015-03-25: 4 mg via INTRAVENOUS
  Filled 2015-03-25: qty 2

## 2015-03-25 MED ORDER — WARFARIN SODIUM 1 MG PO TABS: 5.0000 mg | ORAL_TABLET | Freq: Every evening | ORAL | Status: DC

## 2015-03-25 MED ORDER — ONDANSETRON HCL 4 MG/2ML IJ SOLN
4.0000 mg | Freq: Once | INTRAMUSCULAR | Status: AC
  Administered 2015-03-25: 4 mg via INTRAVENOUS

## 2015-03-25 MED ORDER — POTASSIUM CHLORIDE CRYS ER 20 MEQ PO TBCR
40.0000 meq | EXTENDED_RELEASE_TABLET | Freq: Once | ORAL | Status: AC
  Administered 2015-03-25: 40 meq via ORAL
  Filled 2015-03-25: qty 2

## 2015-03-25 MED ORDER — ACETAMINOPHEN 325 MG PO TABS: 650.0000 mg | ORAL_TABLET | Freq: Four times a day (QID) | ORAL | Status: DC | PRN

## 2015-03-25 MED ORDER — DORZOLAMIDE HCL-TIMOLOL MAL 2-0.5 % OP SOLN
1.0000 [drp] | Freq: Two times a day (BID) | OPHTHALMIC | Status: DC
  Administered 2015-03-25 – 2015-03-29 (×9): 1 [drp] via OPHTHALMIC
  Filled 2015-03-25 (×2): qty 10

## 2015-03-25 MED ORDER — HYDROCODONE-ACETAMINOPHEN 5-325 MG PO TABS
1.0000 | ORAL_TABLET | Freq: Once | ORAL | Status: AC
  Administered 2015-03-25: 1 via ORAL

## 2015-03-25 MED ORDER — FUROSEMIDE 10 MG/ML IJ SOLN
40.0000 mg | Freq: Once | INTRAMUSCULAR | Status: AC
  Administered 2015-03-25: 40 mg via INTRAVENOUS
  Filled 2015-03-25: qty 4

## 2015-03-25 NOTE — Progress Notes (Signed)
Laurie Larsen was weaned off non rebreather and placed on 5l nasal cannula. No signs of SOB. VSS. Afebrile. Positive for flu. Droplet precaution initiated. Pulmonary and cardiology consulted. Given lasix  Today. Urine output adequate. Intermittent confusion started this evening around 1730. Dr. Imogene Burn aware of confusion and troponin of 0.04. No new orders. Son at bedside. Foley in place

## 2015-03-25 NOTE — ED Notes (Signed)
Patient is a resident of the [address] at [facility].  (Independent living area).  Called EMS this morning for c/o difficulty breathing.  ON EMS arrival on 2L/[place] sats were 75%.  Patient placed on 15L NRB sats improved to 92%.  Patient states she has had the flu for one week and persistent cough.

## 2015-03-25 NOTE — ED Notes (Signed)
O2 Sats on 4L/ [place] dropped to 85-86%.  Venti mask

## 2015-03-25 NOTE — Consult Note (Addendum)
CARDIOLOGY CONSULT NOTE       Patient ID: Laurie Larsen MRN: [id number] DOB/AGE: Jul 19, 1923 82 y.o.  Admit date: 03/25/2015 Referring Physician:  Gouru Primary Physician: Wynona Dove, MD Primary Cardiologist:  Ferd Glassing Reason for Consultation:  CHF  Active Problems:   Acute respiratory failure (HCC)   HPI:  82 y.o. with history diastolic CHF HFpEF, chronic afib , SSS with pacer.  Distant CAD 2008 cath with occluded distal circumflex.  She has felt ill for a week. She thinks she has the flu Aches all over. More dyspnea.  Has been compliant with her lasix with no dietary indiscretion. She has had a dry cough no fever or sputum.  No LE edema.  Last echo 06/01/14 with EF 60-65%  Study Conclusions  - Left ventricle: The cavity size was normal. Systolic function was normal. The estimated ejection fraction was in the range of 60% to 65%. Wall motion was normal; there were no regional wall motion abnormalities. - Mitral valve: There was mild regurgitation. - Right ventricle: Pacer wire or catheter noted in right ventricle. Systolic function was normal. - Tricuspid valve: There was moderate regurgitation. - Pulmonary arteries: Systolic pressure was moderate to severely elevated. PA peak pressure: 65 mm Hg (S).  BNP seems chronically elevated   BNP (last 3 results)  Recent Labs  05/31/14 1255 01/22/15 1233 03/25/15 0912  BNP 1124.0* 1232.0* 1204.0*     ROS All other systems reviewed and negative except as noted above  Past Medical History  Diagnosis Date  . Permanent atrial fibrillation (HCC)     a. Chronic Coumadin.  [name] HTN (hypertension)   . Chronic diastolic CHF (congestive heart failure), NYHA class 1 (HCC)     a. 04/2013 Echo: EF 50-55%, triv MR, mildly dil LA, PASP , mild TR, mild-mod RA dil.  [name] CAD (coronary artery disease)     a. 2005 Cath: 100% dLCX-->Med managed.  . Sick sinus syndrome (HCC)     a. 05/2008 s/p MDT EnRhythm DC PPM  ([facility]).  . Chronic pain     a. ? due to arthritis  . Glaucoma   . Hyperlipidemia   . Diverticulitis large intestine   . Allergy   . Phlebitis     a. after pacemeker placement in 2010.  [name] Colon polyps   . Hypothyroidism   . Transfusion history     a. After childbirth 60 years ago (1957)  . Rash/skin eruption     a. Multiple episodes - wide distribution-intermittent, possibly drug rash.  . Pulmonary HTN (HCC)     a. PASP on echo 04/2013.  [name] Chronic Right Pleural Effusion     a. 07/2012 s/p thoracentesis - Followed by Dr. Delford Field [facility] Pulmonology.  . Arthritis   . Bronchitis   . Hay fever   . Colon polyps   . History of blood transfusion   . Urine incontinence   . Back complaints   . Acalculous cholecystitis     Family History  Problem Relation Age of Onset  . Cancer Mother     colon with mets  . Diabetes Mother   . Heart disease Father   . Hyperlipidemia Father   . Hypertension Father   . Heart attack Father   . Stroke Brother     brother who died 06/18/2022  . Cancer Paternal Aunt     breast  . Cancer Cousin     uterine, lung cancer  . Heart attack Brother  Social History   Social History  . Marital Status: Married    Spouse Name: Lorella Nimrod  . Number of Children: 3  . Years of Education: 13   Occupational History  . RETIRED    Social History Main Topics  . Smoking status: Never Smoker   . Smokeless tobacco: Never Used  . Alcohol Use: No  . Drug Use: No  . Sexual Activity: No   Other Topics Concern  . Not on file   Social History Narrative   Lives with husband in [place]. Three sons.      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     Married 1948.    3 sons - 2050/06/07, 2055/06/07, Jun 06, 2056; 5 grandchildren-one deceased -  OD @ 61.        End of Life Care: no prolonged heroic measures, i.e. Artificial feeding or hydration; DNR; no prolonged intubation.    Past Surgical History    Procedure Laterality Date  . Appendectomy  1939  . Abdominal hysterectomy  1977  . Tonsillectomy  82 yrs old  . Kidney surgery  1955    Right  . Ankle fracture surgery  1979    Left, pin  . Pacemaker insertion  06/20/08    MDT EnRhythm DR implanted by Dr Reyes Ivan  . Breast surgery      Needle guided excision, right breast calcification  . Breast fibroadenoma surgery  06-06-2008    Right  . Skin graft      left leg  . Breast biopsy  06-Jun-2009     . atenolol  25 mg Oral BID  . atorvastatin  20 mg Oral QHS  . docusate sodium  100 mg Oral BID  . dorzolamide-timolol  1 drop Both Eyes BID  . doxycycline  100 mg Oral Q12H  . furosemide  40 mg Intravenous Once  . latanoprost  1 drop Both Eyes QHS  . [START ON 03/26/2015] levothyroxine  112 mcg Oral QAC breakfast  . mirtazapine  15 mg Oral QHS  . potassium chloride  10 mEq Oral BID  . sodium chloride flush  3 mL Intravenous Q12H  . sodium chloride flush  3 mL Intravenous Q12H  . warfarin  5 mg Oral QPM  . Warfarin - Physician Dosing Inpatient   Does not apply q1800      Physical Exam: Blood pressure 166/83, pulse 94, temperature 98.5 F (36.9 C), temperature source Axillary, resp. rate 21, height 5\' 4"  (1.626 m), weight 45.36 kg (100 lb), SpO2 90 %.   Affect appropriate Frail thin elderly female HEENT: normal Neck supple with no adenopathy JVP normal no bruits no thyromegaly Lungs clear with no wheezing and good diaphragmatic motion Heart:  S1/S2 SEM  murmur, no rub, gallop or click PMI normal Abdomen: benighn, BS positve, no tenderness, no AAA no bruit.  No HSM or HJR Distal pulses intact with no bruits No edema some hemosiderin discoloration  Neuro non-focal Skin warm and dry No muscular weakness   Labs:   Lab Results  Component Value Date   WBC 8.2 03/25/2015   HGB 13.7 03/25/2015   HCT 40.5 03/25/2015   MCV 90.0 03/25/2015   PLT 123* 03/25/2015    Recent Labs Lab 03/25/15 0814  NA 137  K 3.9  CL 106  CO2 22   BUN 16  CREATININE 0.66  CALCIUM 8.4*  PROT 7.3  BILITOT 0.6  ALKPHOS  91  ALT 19  AST 32  GLUCOSE 101*   Lab Results  Component Value Date   CKTOTAL 43 09/12/2013   CKMB 1.8 09/12/2013   TROPONINI <0.03 03/25/2015    Lab Results  Component Value Date   CHOL 117 06/01/2014   CHOL 91 09/13/2013   CHOL 118 03/26/2011   Lab Results  Component Value Date   HDL 43 06/01/2014   HDL 42 09/13/2013   HDL 51.10 03/26/2011   Lab Results  Component Value Date   LDLCALC 60 06/01/2014   LDLCALC 33 09/13/2013   LDLCALC 46 03/26/2011   Lab Results  Component Value Date   TRIG 68 06/01/2014   TRIG 79 09/13/2013   TRIG 105.0 03/26/2011   Lab Results  Component Value Date   CHOLHDL 2.7 06/01/2014   CHOLHDL 2 03/26/2011   No results found for: LDLDIRECT    Radiology: Dg Chest Port 1 View  03/25/2015  CLINICAL DATA:  Hypoxia, difficulty breathing. EXAM: PORTABLE CHEST 1 VIEW COMPARISON:  01/22/2015 FINDINGS: Bilateral interstitial and airspace opacities with cardiomegaly. This could reflect mild edema. Bibasilar atelectasis with small layering effusions. Left pacer is unchanged. IMPRESSION: Suspect mild interstitial edema. Bibasilar atelectasis with small effusions. Electronically Signed   By: Charlett Nose M.D.   On: 03/25/2015 08:16    EKG: AFib with V pacing    ASSESSMENT AND PLAN:  HFpEF:  Appears mild CXR not impressive BNP seems chronically elevated likely from age, HTN and chronic afib.  Normally takes 80 alt with 60 mg Lasix Can give 40 iv daily for now and follow continue beta blocker for rate control  HTN:  Given afib consider adding cardizem / norvasc if BP remains elevated  Flu:  Droplet precautions influenza panel processing likely the acute issue hear  Afib:  Continue beta blocker for rate control  INR 2.63 RX  Pacer:  Normal function seen by Dr Graciela Husbands recently single lead system as she is in chronic afib  CAD:  No angina, ECG paced troponin negative stable     Signed: Charlton Haws 03/25/2015, 1:09 PM

## 2015-03-25 NOTE — Consult Note (Addendum)
ANTICOAGULATION CONSULT NOTE - Initial Consult  Pharmacy Consult for warfarin Indication: atrial fibrillation  Allergies  Allergen Reactions  . Amoxicillin Other (See Comments)    Reaction:  Weakness  . Cetylpyridinium Other (See Comments)    weakness  . Digoxin Other (See Comments)    Reaction:  Weakness and dehydration  . Risedronate Sodium Rash    Patient Measurements: Height:  (162.6 cm) Weight: 100 lb (45.36 kg) IBW/kg (Calculated) : 54.7 Heparin Dosing Weight:   Vital Signs: Temp: 98.5 F (36.9 C) (02/26 1133) Temp Source: Axillary (02/26 1133) BP: 166/83 mmHg (02/26 1133) Pulse Rate: 94 (02/26 1133)  Labs:  Recent Labs  03/25/15 0814 03/25/15 1033  HGB 13.7  --   HCT 40.5  --   PLT 123*  --   LABPROT 27.7*  --   INR 2.63  --   CREATININE 0.66  --   TROPONINI <0.03 <0.03    Estimated Creatinine Clearance: 33.5 mL/min (by C-G formula based on Cr of 0.66).   Medical History: Past Medical History  Diagnosis Date  . Permanent atrial fibrillation (HCC)     a. Chronic Coumadin.  [name] HTN (hypertension)   . Chronic diastolic CHF (congestive heart failure), NYHA class 1 (HCC)     a. 04/2013 Echo: EF 50-55%, triv MR, mildly dil LA, PASP , mild TR, mild-mod RA dil.  [name] CAD (coronary artery disease)     a. 2005 Cath: 100% dLCX-->Med managed.  . Sick sinus syndrome (HCC)     a. 05/2008 s/p MDT EnRhythm DC PPM ([facility]).  . Chronic pain     a. ? due to arthritis  . Glaucoma   . Hyperlipidemia   . Diverticulitis large intestine   . Allergy   . Phlebitis     a. after pacemeker placement in 2010.  [name] Colon polyps   . Hypothyroidism   . Transfusion history     a. After childbirth 60 years ago (1957)  . Rash/skin eruption     a. Multiple episodes - wide distribution-intermittent, possibly drug rash.  . Pulmonary HTN (HCC)     a. PASP on echo 04/2013.  [name] Chronic Right Pleural Effusion     a. 07/2012 s/p thoracentesis - Followed by Dr. Delford Field [facility] Pulmonology.  . Arthritis   . Bronchitis   . Hay fever   . Colon polyps   . History of blood transfusion   . Urine incontinence   . Back complaints   . Acalculous cholecystitis     Medications:  Scheduled:  . atenolol  25 mg Oral BID  . atorvastatin  20 mg Oral QHS  . docusate sodium  100 mg Oral BID  . dorzolamide-timolol  1 drop Both Eyes BID  . doxycycline  100 mg Oral Q12H  . furosemide  40 mg Intravenous Once  . latanoprost  1 drop Both Eyes QHS  . [START ON 03/26/2015] levothyroxine  112 mcg Oral QAC breakfast  . mirtazapine  15 mg Oral QHS  . oseltamivir  30 mg Oral BID  . potassium chloride  10 mEq Oral BID  . sodium chloride flush  3 mL Intravenous Q12H  . sodium chloride flush  3 mL Intravenous Q12H  . warfarin  5 mg Oral QPM  . Warfarin - Physician Dosing Inpatient   Does not apply q1800    Assessment: Pt is a 82 year old female with a PMH of afib, on warfarin. Home dose is  daily. INR on  admission is therapeutic at 2.63  Goal of Therapy:  INR 2-3 Monitor platelets by anticoagulation protocol: Yes   Plan:  Continue warfarin  daily. Recheck INR in the AM and daily while on antibioitics  Makhai Fulco D Quinto Tippy, Pharm.D Clinical Pharmacist 03/25/2015,2:00 PM

## 2015-03-25 NOTE — Clinical Social Work Note (Signed)
Clinical Social Work Assessment  Patient Details  Name: Laurie Larsen MRN: [id number] Date of Birth: [birth year]  Date of referral:  03/25/15               Reason for consult:  Discharge Planning                Permission sought to share information with:  Family Supports, Customer service manager Permission granted to share information::  Yes, Verbal Permission Granted  Name::     Laurie Larsen and Laurie Larsen ( Patients sons Laurie Larsen # [phone]  Agency::  [employer]  Relationship::  yes  Contact Information:  yes  Housing/Transportation Living arrangements for the past 2 months:  Apartment Source of Information:   Patient and her son Laurie Larsen Patient Interpreter Needed:  None Criminal Activity/Legal Involvement Pertinent to Current Situation/Hospitalization:  No - Comment as needed Significant Relationships:  Spouse, Siblings, Adult Children Lives with:  Spouse Do you feel safe going back to the place where you live?  Yes Need for family participation in patient care:  Yes (Comment)  Care giving concerns: No real concerns at this time. Family will ensure their Larsen have intensive support at home when discharged   Social Worker assessment / plan:  LCSW met with patient and patients youngest son Laurie Larsen [phone]. Both Larsen reside at the [facility] [place] and are fairly active ( last week) out to dinner and shopping at [employer] [place] Mrs Nutting does use a walker with wheels and her husband is mobile as well. Patient is oriented to person,place, situation and was able to laugh at appropriate times. It is the family's plan to have both Larsen once discharged to be re-united in their home with addition private support and the [place] offers. Family will likely increase daily visits and calls to ensure both Larsen are well cared for. LCSW did not complete Fl2 at this time as patient returning to independent living with her husband. Patient and son are  hopeful patient will be on other medical unit once her breathing is stable No Bipap at this time she has a nasal canula.  Employment status:  Retired Forensic scientist:  Medicare ([place] part [place]) PT Recommendations:  Not assessed at this time Information / Referral to community resources:     Patient/Family's Response to care: They understand that both Larsen are in the hospital and agree additional care will be provided to ensure their health and well being  Patient/Family's Understanding of and Emotional Response to Diagnosis, Current Treatment, and Prognosis: Its OK and son Laurie Larsen stated all family members helpful and supportive.  Emotional Assessment Appearance:  Appears stated age Attitude/Demeanor/Rapport:   (Very polite and alert) Affect (typically observed):  Accepting, Adaptable, Hopeful, Calm, Appropriate Orientation:  Oriented to Self, Oriented to Place, Oriented to  Time, Oriented to Situation Alcohol / Substance use:  Never Used Psych involvement (Current and /or in the community):  No (Comment)  Discharge Needs  Concerns to be addressed:  No discharge needs identified Readmission within the last 30 days:  No Current discharge risk:  None Barriers to Discharge:  No Barriers Identified   Joana Reamer, LCSW 03/25/2015, 2:58 PM

## 2015-03-25 NOTE — Progress Notes (Signed)
LCSW met with patient and her youngest son Laurie Larsen. It was relayed that patient and her spouse ( Laurie Larsen) is also inpatient on the 1st floor they have influenza. Both patients up to last week were active and independent ( Laurie Larsen uses a walker) right now she is in ICU to stabilize her breathing. LCSW collected data to complete the CSW assessment. Patients son stated  Both parents will be returning to apartment with  private support and VIP service offered at [place] to ensure all their needs are met. No further needs at this time

## 2015-03-25 NOTE — ED Notes (Signed)
Remains on 100% NRB.  OOB to bedside commode with oxygen in place. Patients O2Sats dropped to 88%.  Dr. Shaune Pollack alerted,  Foley catheter ordered and placed.  Continue to monitor.

## 2015-03-25 NOTE — H&P (Addendum)
[facility] [place] at [facility]   PATIENT NAME: Laurie Larsen    MR#:  [id number]  DATE OF BIRTH:  1923/04/30  DATE OF ADMISSION:  03/25/2015  PRIMARY CARE PHYSICIAN: Wynona Dove, MD   REQUESTING/REFERRING PHYSICIAN: Shaune Pollack  CHIEF COMPLAINT:  Shortness of breath  HISTORY OF PRESENT ILLNESS:  Laurie Larsen  is a 82 y.o. female with a known history of chronic congestive heart failure, chronic atrial fibrillation on Coumadin, essential hypertension lives on 2 L of oxygen via nasal cannula at home is presenting to the ED with a chief complaint of shortness of breath. Patient has been having flulike symptoms for 1 week with persistent cough. Upon arrival to the ED patient's pulse ox was at 75% on 2 L of home O2, initially she was placed on 4 L of oxygen with no significant improvement subsequently patient was placed on nonrebreather at 100%. Patient has DO NOT RESUSCITATE as CODE STATUS but patient and son who is the healthcare power of attorney has requested to change that to full code  PAST MEDICAL HISTORY:   Past Medical History  Diagnosis Date  . Permanent atrial fibrillation (HCC)     a. Chronic Coumadin.  [name] HTN (hypertension)   . Chronic diastolic CHF (congestive heart failure), NYHA class 1 (HCC)     a. 04/2013 Echo: EF 50-55%, triv MR, mildly dil LA, PASP , mild TR, mild-mod RA dil.  [name] CAD (coronary artery disease)     a. 2005 Cath: 100% dLCX-->Med managed.  . Sick sinus syndrome (HCC)     a. 05/2008 s/p MDT EnRhythm DC PPM (Allred).  . Chronic pain     a. ? due to arthritis  . Glaucoma   . Hyperlipidemia   . Diverticulitis large intestine   . Allergy   . Phlebitis     a. after pacemeker placement in 2010.  [name] Colon polyps   . Hypothyroidism   . Transfusion history     a. After childbirth 60 years ago (1957)  . Rash/skin eruption     a. Multiple episodes - wide distribution-intermittent, possibly drug rash.  . Pulmonary HTN  (HCC)     a. PASP on echo 04/2013.  [name] Chronic Right Pleural Effusion     a. 07/2012 s/p thoracentesis - Followed by Dr. Delford Field [facility] Pulmonology.  . Arthritis   . Bronchitis   . Hay fever   . Colon polyps   . History of blood transfusion   . Urine incontinence   . Back complaints   . Acalculous cholecystitis     PAST SURGICAL HISTOIRY:   Past Surgical History  Procedure Laterality Date  . Appendectomy  1939  . Abdominal hysterectomy  1977  . Tonsillectomy  82 yrs old  . Kidney surgery  1955    Right  . Ankle fracture surgery  1979    Left, pin  . Pacemaker insertion  06/20/08    MDT EnRhythm DR implanted by Dr Reyes Ivan  . Breast surgery      Needle guided excision, right breast calcification  . Breast fibroadenoma surgery  2010    Right  . Skin graft      left leg  . Breast biopsy  2011    SOCIAL HISTORY:   Social History  Substance Use Topics  . Smoking status: Never Smoker   . Smokeless tobacco: Never Used  . Alcohol Use: No    FAMILY HISTORY:   Family History  Problem Relation  Age of Onset  . Cancer Mother     colon with mets  . Diabetes Mother   . Heart disease Father   . Hyperlipidemia Father   . Hypertension Father   . Heart attack Father   . Stroke Brother     brother who died 26-Jun-2022  . Cancer Paternal Aunt     breast  . Cancer Cousin     uterine, lung cancer  . Heart attack Brother     DRUG ALLERGIES:   Allergies  Allergen Reactions  . Amoxicillin Other (See Comments)    Reaction:  Weakness  . Cetylpyridinium Other (See Comments)    weakness  . Digoxin Other (See Comments)    Reaction:  Weakness and dehydration  . Risedronate Sodium Rash    REVIEW OF SYSTEMS:  CONSTITUTIONAL: No fever, fatigue or weakness.  EYES: No blurred or double vision.  EARS, NOSE, AND THROAT: No tinnitus or ear pain.  RESPIRATORY: Reports cough, shortness of breath, denies wheezing or hemoptysis.  CARDIOVASCULAR: No chest pain, orthopnea, edema.   GASTROINTESTINAL: No nausea, vomiting, diarrhea or abdominal pain.  GENITOURINARY: No dysuria, hematuria.  ENDOCRINE: No polyuria, nocturia,  HEMATOLOGY: No anemia, easy bruising or bleeding SKIN: No rash or lesion. MUSCULOSKELETAL: No joint pain or arthritis.   NEUROLOGIC: No tingling, numbness, weakness.  PSYCHIATRY: No anxiety or depression.   MEDICATIONS AT HOME:   Prior to Admission medications   Medication Sig Start Date End Date Taking? Authorizing Provider  acetaminophen (TYLENOL) 650 MG CR tablet Take 1,300 mg by mouth at bedtime as needed for pain.   Yes Historical Provider, MD  atenolol (TENORMIN) 25 MG tablet Take 1 tablet (25 mg total) by mouth 2 (two) times daily. 10/03/14  Yes Peter M [place], MD  atorvastatin (LIPITOR) 40 MG tablet Take 0.5 tablets (20 mg total) by mouth at bedtime. 08/23/14  Yes Tommie Sams, DO  cetirizine (ZYRTEC) 10 MG tablet Take 10 mg by mouth daily.   Yes Historical Provider, MD  diltiazem (CARDIZEM CD) 180 MG 24 hr capsule TAKE ONE CAPSULE DAILY 12/14/14  Yes Peter M [place], MD  dorzolamide-timolol (COSOPT) 22.3-6.8 MG/ML ophthalmic solution Place 1 drop into both eyes 2 (two) times daily.    Yes Historical Provider, MD  furosemide (LASIX) 40 MG tablet Take 60-80 mg by mouth See admin instructions. Take 2 tablets (80mg ) by mouth every other day and Take 1.5 tablets (60mg ) by mouth every other day, then alternate between the 2 doses.   Yes Historical Provider, MD  HYDROcodone-acetaminophen (NORCO) 10-325 MG tablet Take 1 tablet by mouth 3 (three) times daily as needed for moderate pain. 03/21/15  Yes Shelia Media, MD  levothyroxine (SYNTHROID, LEVOTHROID) 112 MCG tablet Take 1 tablet (112 mcg total) by mouth daily. 09/06/14  Yes Tommie Sams, DO  mirtazapine (REMERON) 15 MG tablet Take 1 tablet (15 mg total) by mouth at bedtime. Patient taking differently: Take 15 mg by mouth at bedtime as needed (for sleep.).  02/20/15  Yes Shelia Media, MD   OXYGEN Inhale 2 L/min into the lungs at bedtime.   Yes Historical Provider, MD  polyethylene glycol (MIRALAX / GLYCOLAX) packet Take 17 g by mouth at bedtime.    Yes Historical Provider, MD  potassium chloride (K-DUR) 10 MEQ tablet Take 10 mEq by mouth 2 (two) times daily.   Yes Historical Provider, MD  traMADol (ULTRAM) 50 MG tablet Take 1 tablet (50 mg total) by mouth 3 (three) times daily as  needed. 02/20/15  Yes Shelia Media, MD  Travoprost, BAK Free, (TRAVATAN) 0.004 % SOLN ophthalmic solution Place 1 drop into both eyes at bedtime.   Yes Historical Provider, MD  Vitamin D, Ergocalciferol, (DRISDOL) 50000 UNITS CAPS capsule Take 50,000 Units by mouth every 7 (seven) days. Pt takes on Saturday.   Yes Historical Provider, MD  warfarin (COUMADIN) 5 MG tablet Take 1 tablet by mouth daily or as directed by coumadin clinic Patient taking differently: Take 5 mg by mouth every evening. Take 1 tablet by mouth daily or as directed by coumadin clinic 03/21/15  Yes Lewayne Bunting, MD      VITAL SIGNS:  Blood pressure 166/83, pulse 94, temperature 98.5 F (36.9 C), temperature source Axillary, resp. rate 21, height  (1.626 m), weight 45.36 kg (100 lb), SpO2 90 %.  PHYSICAL EXAMINATION:  GENERAL:  82 y.o.-year-old patient lying in the bed with no acute distress.  EYES: Pupils equal, round, reactive to light and accommodation. No scleral icterus. Extraocular muscles intact.  HEENT: Head atraumatic, normocephalic. Oropharynx and nasopharynx clear.  NECK:  Supple, no jugular venous distention. No thyroid enlargement, no tenderness.  LUNGS: Diminished breath sounds bilaterally, no wheezing, positive rales,rhonchi or crepitation. No use of accessory muscles of respiration. On nonrebreather CARDIOVASCULAR: Irregularly irregular, positive murmurs, no rubs, or gallops.  ABDOMEN: Soft, nontender, nondistended. Bowel sounds present. No organomegaly or mass.  EXTREMITIES: No pedal edema, cyanosis, or  clubbing.  NEUROLOGIC: Cranial nerves II through XII are intact. Muscle strength 5/5 in all extremities. Sensation intact. Gait not checked.  PSYCHIATRIC: The patient is alert and oriented x 3.  SKIN: No obvious rash, lesion, or ulcer.   LABORATORY PANEL:   CBC  Recent Labs Lab 03/25/15 0814  WBC 8.2  HGB 13.7  HCT 40.5  PLT 123*   ------------------------------------------------------------------------------------------------------------------  Chemistries   Recent Labs Lab 03/25/15 0814  NA 137  K 3.9  CL 106  CO2 22  GLUCOSE 101*  BUN 16  CREATININE 0.66  CALCIUM 8.4*  AST 32  ALT 19  ALKPHOS 91  BILITOT 0.6   ------------------------------------------------------------------------------------------------------------------  Cardiac Enzymes  Recent Labs Lab 03/25/15 1033  TROPONINI <0.03   ------------------------------------------------------------------------------------------------------------------  RADIOLOGY:  Dg Chest Port 1 View  03/25/2015  CLINICAL DATA:  Hypoxia, difficulty breathing. EXAM: PORTABLE CHEST 1 VIEW COMPARISON:  01/22/2015 FINDINGS: Bilateral interstitial and airspace opacities with cardiomegaly. This could reflect mild edema. Bibasilar atelectasis with small layering effusions. Left pacer is unchanged. IMPRESSION: Suspect mild interstitial edema. Bibasilar atelectasis with small effusions. Electronically Signed   By: Charlett Nose M.D.   On: 03/25/2015 08:16    EKG:   Orders placed or performed in visit on 03/08/15  . EKG 12-Lead    IMPRESSION AND PLAN:  Yoceline Bazar  is a 82 y.o. female with a known history of chronic congestive heart failure, chronic atrial fibrillation on Coumadin, essential hypertension lives on 2 L of oxygen via nasal cannula at home is presenting to the ED with a chief complaint of shortness of breath. Patient has been having flulike symptoms for 1 week with persistent cough. Upon arrival to the ED patient's  pulse ox was at 75% on 2 L of home O2, initially she was placed on 4 L of oxygen with no significant improvement subsequently patient was placed on nonrebreather at 100%.   #Acute hypoxic respiratory failure secondary to CHF exacerbation/positive influenza A/probably superimposed pneumonia Admit to stepdown unit, patient is on nonrebreather 100%, patient will  be placed on BiPAP for better diuresis Tamiflu 75 mg by mouth twice a day Droplet precautions Lasix IV and start her on  Antibiotics Pulmonology consult is placed Provide nebulizer treatments as needed  #Acute on chronic congestive heart failure Monitor intake and output, daily weights Low-salt intake is recommended IV Lasix Echocardiogram and cardiology consult is placed to left lower cardiology  #Influenza A with possible superimposed pneumonia Chest x-ray shows bilateral airspace opacities which is probably from pneumonia Pulmonology started patient on doxycycline We will obtain sputum culture and sensitivity  #Hypertension elevated blood pressure Will consider adding calcium channel blocker blood pressure is elevated  #Chronic atrial fibrillation Rate controlled. Continue beta blocker and Coumadin. INR is therapeutic   DVT prophylaxis patient is on Coumadin, INR is therapeutic, Ted's  All the records are reviewed and case discussed with ED provider. Management plans discussed with the patient, family and they are in agreement.  CODE STATUS: fc , 2 sons Mr. Paraskevi Funez and Mr. Fredrik Cove. Dom[place] are the healthcare power of attorney  TOTALCritical care TIME TAKING CARE OF THIS PATIENT: 45  minutes.    Ramonita Lab M.D on 03/25/2015 at 1:57 PM  Between 7am to 6pm - Pager - [phone]  After 6pm go to [email or web address] - password [email or web address] [facility]  [place]  [facility]  Office  [phone]  CC: Primary care physician; Wynona Dove, MD

## 2015-03-25 NOTE — Progress Notes (Signed)
Bipap on standby. Pt is in no distress at this time.

## 2015-03-25 NOTE — ED Provider Notes (Signed)
[facility] Emergency Department Provider Note   ____________________________________________  Time seen: Approximately 7:50 AM I have reviewed the triage vital signs and the triage nursing note.  HISTORY  Chief Complaint Respiratory Distress   Historian Limited history from patient as she is in respiratory distress History provided by EMS report  HPI Laurie Larsen is a 82 y.o. female with a history of hypertension, CHF, pacemaker, A. fib on Coumadin, and home 2 L nasal cannula per his since today after calling EMS for trouble breathing. Upon EMS arrival O2 sat was 75% on 2 L home O2. With nonrebreather 15 L O2 sat up to approximately 92%. Report that patient has had "flulike symptoms "for about 1 week and a persistent cough. No reported fever today.  Nonproductive cough. No report of fever. No chest pain. No vomiting. However there is severe shortness of breath.    Past Medical History  Diagnosis Date  . Permanent atrial fibrillation (HCC)     a. Chronic Coumadin.  [name] HTN (hypertension)   . Chronic diastolic CHF (congestive heart failure), NYHA class 1 (HCC)     a. 04/2013 Echo: EF 50-55%, triv MR, mildly dil LA, PASP , mild TR, mild-mod RA dil.  [name] CAD (coronary artery disease)     a. 2005 Cath: 100% dLCX-->Med managed.  . Sick sinus syndrome (HCC)     a. 05/2008 s/p MDT EnRhythm DC PPM ([facility]).  . Chronic pain     a. ? due to arthritis  . Glaucoma   . Hyperlipidemia   . Diverticulitis large intestine   . Allergy   . Phlebitis     a. after pacemeker placement in 2010.  [name] Colon polyps   . Hypothyroidism   . Transfusion history     a. After childbirth 60 years ago (1957)  . Rash/skin eruption     a. Multiple episodes - wide distribution-intermittent, possibly drug rash.  . Pulmonary HTN (HCC)     a. PASP on echo 04/2013.  [name] Chronic Right Pleural Effusion     a. 07/2012 s/p thoracentesis - Followed by Dr. Delford Field [facility]  Pulmonology.  . Arthritis   . Bronchitis   . Hay fever   . Colon polyps   . History of blood transfusion   . Urine incontinence   . Back complaints   . Acalculous cholecystitis     Patient Active Problem List   Diagnosis Date Noted  . Pyrexia 03/21/2015  . Insomnia 02/20/2015  . Macular degeneration 02/20/2015  . Chronic pain syndrome 02/20/2015  . Screening for breast cancer 02/20/2015  . Cholecystostomy tube dysfunction   . Dyspnea   . Glaucoma 11/22/2014  . Cholecystitis 10/17/2014  . Protein-calorie malnutrition, severe (HCC) 06/01/2014  . CHF (congestive heart failure), NYHA class I (HCC) 05/31/2014  . Cardiac pacemaker in situ 03/10/2014  . HLD (hyperlipidemia) 03/10/2014  . Pulmonary HTN (HCC) 06/09/2013  . Nocturnal hypoxemia 04/20/2013  . Depression 06/23/2011  . Diastolic CHF, chronic (HCC) 11/13/2010  . Recurrent pleural effusion on right 10/01/2010  . Chronic anticoagulation 07/10/2010  . Hypothyroidism 04/24/2010  . Essential hypertension, benign 03/01/2010  . ATRIAL FIBRILLATION, CHRONIC 03/01/2010    Past Surgical History  Procedure Laterality Date  . Appendectomy  1939  . Abdominal hysterectomy  1977  . Tonsillectomy  82 yrs old  . Kidney surgery  1955    Right  . Ankle fracture surgery  1979    Left, pin  . Pacemaker insertion  06/20/08    MDT EnRhythm DR implanted by Dr Reyes Ivan  . Breast surgery      Needle guided excision, right breast calcification  . Breast fibroadenoma surgery  2010    Right  . Skin graft      left leg  . Breast biopsy  2011    Current Outpatient Rx  Name  Route  Sig  Dispense  Refill  . acetaminophen (TYLENOL) 650 MG CR tablet   Oral   Take 1,300 mg by mouth at bedtime as needed for pain.         [name] atenolol (TENORMIN) 25 MG tablet   Oral   Take 1 tablet (25 mg total) by mouth 2 (two) times daily.   180 tablet   3   . atorvastatin (LIPITOR) 40 MG tablet   Oral   Take 0.5 tablets (20 mg total) by mouth at  bedtime.   45 tablet   3   . cetirizine (ZYRTEC) 10 MG tablet   Oral   Take 10 mg by mouth daily.         [name] diltiazem (CARDIZEM CD) 180 MG 24 hr capsule      TAKE ONE CAPSULE DAILY   90 capsule   3   . dorzolamide-timolol (COSOPT) 22.3-6.8 MG/ML ophthalmic solution   Both Eyes   Place 1 drop into both eyes 2 (two) times daily.          . furosemide (LASIX) 40 MG tablet   Oral   Take 60-80 mg by mouth See admin instructions. Take 2 tablets (80mg ) by mouth every other day and Take 1.5 tablets (60mg ) by mouth every other day, then alternate between the 2 doses.         [name] HYDROcodone-acetaminophen (NORCO) 10-325 MG tablet   Oral   Take 1 tablet by mouth 3 (three) times daily as needed for moderate pain.   90 tablet   0     We are taking over Rx from [facility], Dr. Ethelene Hal.  ...   . levothyroxine (SYNTHROID, LEVOTHROID) 112 MCG tablet   Oral   Take 1 tablet (112 mcg total) by mouth daily.   90 tablet   1   . mirtazapine (REMERON) 15 MG tablet   Oral   Take 1 tablet (15 mg total) by mouth at bedtime. Patient taking differently: Take 15 mg by mouth at bedtime as needed (for sleep.).    30 tablet   3   . OXYGEN   Inhalation   Inhale 2 L/min into the lungs at bedtime.         . polyethylene glycol (MIRALAX / GLYCOLAX) packet   Oral   Take 17 g by mouth at bedtime.          . potassium chloride (K-DUR) 10 MEQ tablet   Oral   Take 10 mEq by mouth 2 (two) times daily.         . traMADol (ULTRAM) 50 MG tablet   Oral   Take 1 tablet (50 mg total) by mouth 3 (three) times daily as needed.   90 tablet   3   . Travoprost, BAK Free, (TRAVATAN) 0.004 % SOLN ophthalmic solution   Both Eyes   Place 1 drop into both eyes at bedtime.         . Vitamin D, Ergocalciferol, (DRISDOL) 50000 UNITS CAPS capsule   Oral   Take 50,000 Units by mouth every 7 (seven) days. Pt takes on Saturday.         [name]  warfarin (COUMADIN) 5 MG tablet      Take 1 tablet by mouth  daily or as directed by coumadin clinic Patient taking differently: Take 5 mg by mouth every evening. Take 1 tablet by mouth daily or as directed by coumadin clinic   90 tablet   1     Allergies Amoxicillin; Cetylpyridinium; Digoxin; and Risedronate sodium  Family History  Problem Relation Age of Onset  . Cancer Mother     colon with mets  . Diabetes Mother   . Heart disease Father   . Hyperlipidemia Father   . Hypertension Father   . Heart attack Father   . Stroke Brother     brother who died 2022/06/15  . Cancer Paternal Aunt     breast  . Cancer Cousin     uterine, lung cancer  . Heart attack Brother     Social History Social History  Substance Use Topics  . Smoking status: Never Smoker   . Smokeless tobacco: Never Used  . Alcohol Use: No    Review of Systems  Constitutional: Negative for fever. Eyes: Negative for visual changes. ENT: Negative for sore throat. Cardiovascular: Negative for chest pain. Respiratory: Positive for shortness of breath. Gastrointestinal: Negative for abdominal pain, vomiting and diarrhea. Genitourinary: Negative for dysuria. Musculoskeletal: Negative for back pain. Skin: Negative for rash. Neurological: Negative for headache. 10 point Review of Systems otherwise negative ____________________________________________   PHYSICAL EXAM:  VITAL SIGNS: ED Triage Vitals  Enc Vitals Group     BP --      Pulse Rate 03/25/15 0757 114     Resp 03/25/15 0757 26     Temp 03/25/15 0757 97.7 F (36.5 C)     Temp src --      SpO2 03/25/15 0757 91 %     Weight 03/25/15 0757 100 lb (45.36 kg)     Height 03/25/15 0757 5\' 4"  (1.626 m)     Head Cir --      Peak Flow --      Pain Score 03/25/15 0800 0     Pain Loc --      Pain Edu? --      Excl. in GC? --      Constitutional: Alert and oriented. Moderate severe respiratory distress. HEENT   Head: Normocephalic and atraumatic.      Eyes: Conjunctivae are normal. PERRL. Normal extraocular  movements.      Ears:         Nose: No congestion/rhinnorhea.   Mouth/Throat: Mucous membranes are moist.   Neck: No stridor. Cardiovascular/Chest: Irregularly irregular.  No murmurs, rubs, or gallops. Respiratory: Fair air movement in the upper lung fields, no wheezing. Decreased air movement in the lower fields bilaterally. Moderate retractions. Tachypnea. Gastrointestinal: Soft. No distention, no guarding, no rebound. Nontender.    Genitourinary/rectal:Deferred Musculoskeletal: Nontender with normal range of motion in all extremities. No joint effusions.  No lower extremity tenderness.  No edema. Neurologic:  Normal speech and language. No gross or focal neurologic deficits are appreciated. Skin:  Skin is warm, dry and intact. No rash noted. Psychiatric: Mood and affect are normal. Speech and behavior are normal. Patient exhibits appropriate insight and judgment.  ____________________________________________   EKG I, Governor Rooks, MD, the attending physician have personally viewed and interpreted all ECGs.  99 bpm. Irregularly irregular atrial fibrillation. Nonspecific intraventricular conduction delay. Normal axis. Nonspecific T-wave ____________________________________________  LABS (pertinent positives/negatives)  Comprehensive metabolic panel without significant abnormalities Lipase 29 7 and  troponin less than 0.03 Lactate 1.2 Up with the [place] 0.7, hemoglobin 13.7 and put the count 123 INR 2.63  ____________________________________________  RADIOLOGY All Xrays were viewed by me. Imaging interpreted by Radiologist.  Chest portable 1 view:  IMPRESSION: Suspect mild interstitial edema. Bibasilar atelectasis with small effusions. __________________________________________  PROCEDURES  Procedure(s) performed: None  Critical Care performed: CRITICAL CARE Performed by: Governor Rooks   Total critical care time: 30 minutes  Critical care time was exclusive  of separately billable procedures and treating other patients.  Critical care was necessary to treat or prevent imminent or life-threatening deterioration.  Critical care was time spent personally by me on the following activities: development of treatment plan with patient and/or surrogate as well as nursing, discussions with consultants, evaluation of patient's response to treatment, examination of patient, obtaining history from patient or surrogate, ordering and performing treatments and interventions, ordering and review of laboratory studies, ordering and review of radiographic studies, pulse oximetry and re-evaluation of patient's condition.  ____________________________________________   ED COURSE / ASSESSMENT AND PLAN  Pertinent labs & imaging results that were available during my care of the patient were reviewed by me and considered in my medical decision making (see chart for details).   EMS reported to me that there is history of COPD and CHF and patient was having acute shortness of breath and hypoxia in the setting of recent "flu like illness. "  She is maintaining oxygenation on nonrebreather at around 92%.  Clinically this patient I suspect is having acute CHF exacerbation, as she has some decreased air movement but there is no wheezing at all. When I reviewed her chart history does not appear that she does have a history of COPD. She is on home 2 L O2 and was hypoxic on that. Her blood pressure is significantly elevated.  Although she has a history of recent flulike illness, she is not febrile here, does not have an elevated white blood cell count, and has no infiltrate on chest x-ray.  I am going to go ahead and treat her for acute CHF exacerbation. She is receiving 60 mg of IV Lasix for diuresis and 1 inch of nitro paste for hypertension.  No other evidence of sepsis.  Abdomen the patient and her son.   CONSULTATIONS:   Hospitalist for admission   Patient / Family  / Caregiver informed of clinical course, medical decision-making process, and agree with plan.     ___________________________________________   FINAL CLINICAL IMPRESSION(S) / ED DIAGNOSES   Final diagnoses:  Acute on chronic congestive heart failure, unspecified congestive heart failure type (HCC)  Hypoxia              Note: This dictation was prepared with Dragon dictation. Any transcriptional errors that result from this process are unintentional   Governor Rooks, MD 03/25/15 [phone]

## 2015-03-26 ENCOUNTER — Encounter: Payer: Self-pay | Admitting: *Deleted

## 2015-03-26 ENCOUNTER — Inpatient Hospital Stay: Payer: Medicare Other

## 2015-03-26 ENCOUNTER — Ambulatory Visit: Payer: Medicare Other | Admitting: Internal Medicine

## 2015-03-26 ENCOUNTER — Telehealth: Payer: Self-pay | Admitting: Internal Medicine

## 2015-03-26 DIAGNOSIS — E440 Moderate protein-calorie malnutrition: Secondary | ICD-10-CM | POA: Insufficient documentation

## 2015-03-26 LAB — CBC
HCT: 36.8 % (ref 35.0–47.0)
Hemoglobin: 12.4 g/dL (ref 12.0–16.0)
MCH: 30.4 pg (ref 26.0–34.0)
MCHC: 33.7 g/dL (ref 32.0–36.0)
MCV: 90 fL (ref 80.0–100.0)
PLATELETS: 122 10*3/uL — AB (ref 150–440)
RBC: 4.09 MIL/uL (ref 3.80–5.20)
RDW: 15 % — AB (ref 11.5–14.5)
WBC: 15.1 10*3/uL — ABNORMAL HIGH (ref 3.6–11.0)

## 2015-03-26 LAB — EXPECTORATED SPUTUM ASSESSMENT W GRAM STAIN, RFLX TO RESP C

## 2015-03-26 LAB — COMPREHENSIVE METABOLIC PANEL
ALBUMIN: 3.1 g/dL — AB (ref 3.5–5.0)
ALT: 14 U/L (ref 14–54)
AST: 26 U/L (ref 15–41)
Alkaline Phosphatase: 73 U/L (ref 38–126)
Anion gap: 10 (ref 5–15)
BILIRUBIN TOTAL: 1.1 mg/dL (ref 0.3–1.2)
BUN: 21 mg/dL — AB (ref 6–20)
CHLORIDE: 103 mmol/L (ref 101–111)
CO2: 22 mmol/L (ref 22–32)
CREATININE: 1.15 mg/dL — AB (ref 0.44–1.00)
Calcium: 7.6 mg/dL — ABNORMAL LOW (ref 8.9–10.3)
GFR calc Af Amer: 47 mL/min — ABNORMAL LOW (ref >60)
GFR calc non Af Amer: 41 mL/min — ABNORMAL LOW (ref >60)
GLUCOSE: 89 mg/dL (ref 65–99)
POTASSIUM: 3.8 mmol/L (ref 3.5–5.1)
Sodium: 135 mmol/L (ref 135–145)
Total Protein: 5.9 g/dL — ABNORMAL LOW (ref 6.5–8.1)

## 2015-03-26 LAB — TSH: TSH: 3.016 u[IU]/mL (ref 0.350–4.500)

## 2015-03-26 LAB — PROTIME-INR
INR: 4.37
PROTHROMBIN TIME: 40.6 s — AB (ref 11.4–15.0)

## 2015-03-26 LAB — EXPECTORATED SPUTUM ASSESSMENT W REFEX TO RESP CULTURE

## 2015-03-26 MED ORDER — SODIUM CHLORIDE 0.9 % IV BOLUS (SEPSIS)
500.0000 mL | Freq: Once | INTRAVENOUS | Status: AC
  Administered 2015-03-26: 500 mL via INTRAVENOUS

## 2015-03-26 MED ORDER — ENSURE ENLIVE PO LIQD
237.0000 mL | Freq: Two times a day (BID) | ORAL | Status: DC
  Administered 2015-03-26 – 2015-03-29 (×6): 237 mL via ORAL

## 2015-03-26 MED ORDER — SODIUM CHLORIDE 0.9 % IV SOLN
INTRAVENOUS | Status: DC
  Administered 2015-03-26: 02:00:00 via INTRAVENOUS

## 2015-03-26 MED ORDER — SODIUM CHLORIDE 0.9 % IV SOLN: INTRAVENOUS | Status: AC

## 2015-03-26 MED ORDER — SODIUM CHLORIDE 0.9 % IV SOLN
Freq: Once | INTRAVENOUS | Status: AC
Start: 2015-03-26 — End: 2015-03-28
  Administered 2015-03-26: 15:00:00 via INTRAVENOUS

## 2015-03-26 NOTE — Telephone Encounter (Signed)
Laurie Larsen called from [place] regarding pt not able to make her appt today. Pt was admitted on 03/24/2015 at the [facility]. Appt is still on the sch. Thank you!

## 2015-03-26 NOTE — Progress Notes (Signed)
Initial Nutrition Assessment  DOCUMENTATION CODES:   Non-severe (moderate) malnutrition in context of chronic illness  INTERVENTION:   Meals and Snacks: Cater to patient preferences Medical Food Supplement Therapy: recommend Ensure Enlive po BID, each supplement provides 350 kcal and 20 grams of protein  NUTRITION DIAGNOSIS:   Malnutrition related to chronic illness as evidenced by mild depletion of body fat, mild depletion of muscle mass, moderate depletions of muscle mass, moderate depletion of body fat.   GOAL:   Patient will meet greater than or equal to 90% of their needs  MONITOR:    (Energy Intake, Anthropometrics, Digestive System, Electrolyte/Renal Profile)  REASON FOR ASSESSMENT:   Malnutrition Screening Tool    ASSESSMENT:    Pt admitted with acute respiratory failure due to CHF exacerbation, influenza A positive,  Probably superimposed pneumonia  Past Medical History  Diagnosis Date  . Permanent atrial fibrillation (HCC)     a. Chronic Coumadin.  [name] HTN (hypertension)   . Chronic diastolic CHF (congestive heart failure), NYHA class 1 (HCC)     a. 04/2013 Echo: EF 50-55%, triv MR, mildly dil LA, PASP , mild TR, mild-mod RA dil.  [name] CAD (coronary artery disease)     a. 2005 Cath: 100% dLCX-->Med managed.  . Sick sinus syndrome (HCC)     a. 05/2008 s/p MDT EnRhythm DC PPM ([facility]).  . Chronic pain     a. ? due to arthritis  . Glaucoma   . Hyperlipidemia   . Diverticulitis large intestine   . Allergy   . Phlebitis     a. after pacemeker placement in 2010.  [name] Colon polyps   . Hypothyroidism   . Transfusion history     a. After childbirth 60 years ago (1957)  . Rash/skin eruption     a. Multiple episodes - wide distribution-intermittent, possibly drug rash.  . Pulmonary HTN (HCC)     a. PASP on echo 04/2013.  [name] Chronic Right Pleural Effusion     a. 07/2012 s/p thoracentesis - Followed by Dr. Delford Field [facility] Pulmon[facility].  . Arthritis   .  Bronchitis   . Hay fever   . Colon polyps   . History of blood transfusion   . Urine incontinence   . Back complaints   . Acalculous cholecystitis   . CHF (congestive heart failure) (HCC)      Diet Order:  Diet Heart Room service appropriate?: Yes; Fluid consistency:: Thin   Energy Intake: pt ate some breakfast this AM, reports eating better here than at home  Food and nutrition Related History: on visit today, pt reports she is "not worried about her appetite," pt very preoccupied by other things (missed appointments, getting situated in the bed); did not want to talk about appetite. Pt does reports she has tried Ensure in the past  Skin:  Reviewed, no issues  Last BM:  2/25   Nutrition Focused Physical Exam: Nutrition-Focused physical exam completed. Findings are mild/moderate fat depletion, mild/moderate muscle depletion, and no edema.   Height:   Ht Readings from Last 1 Encounters:  03/25/15  (1.626 m)    Weight: pt reports UBW between 98-100 pounds; no recent weight loss; pt reports several years ago she lost a lot of weight and has never been able to gain it back  Wt Readings from Last 1 Encounters:  03/26/15 104 lb 11.5 oz (47.5 kg)   Wt Readings from Last 10 Encounters:  03/26/15 104 lb 11.5 oz (47.5 kg)  03/21/15 101 lb (45.813 kg)  03/14/15 105 lb 9.6 oz (47.9 kg)  03/08/15 105 lb 4 oz (47.741 kg)  02/20/15 101 lb (45.813 kg)  02/02/15 98 lb (44.453 kg)  01/22/15 100 lb (45.36 kg)  01/11/15 103 lb (46.72 kg)  12/08/14 99 lb (44.906 kg)  11/29/14 100 lb (45.36 kg    BMI:  Body mass index is 17.97 kg/(m^2).  Estimated Nutritional Needs:   Kcal:  1258-1373 kcals (BEE 880, 1.3 AF, 1.1-1.2 IF)   Protein:  53-62 g (1.1-1.3 g/kg)   Fluid:  1200-1440 mL (25-30 ml/kg)   HIGH Care Level  Romelle Starcher MS, RD, LDN [phone] Pager  [phone] Weekend/On-Call Pager

## 2015-03-26 NOTE — Progress Notes (Signed)
Pt. Vital signs are stable, on 4 L [place], NS @ 100 mL/h to help support BP.  UOP still on low side (on average 15 mL/h) even though total 387 (290 pre 2000).   Am labs: pt.'s INR: 4.37, pt. taking coumadin @ night- spoke w/ Dr. Sheryle Hail who stated to hold next dose this evening will discuss with AM RN. Gave report to [place], Charity fundraiser

## 2015-03-26 NOTE — Progress Notes (Addendum)
Patient transferred to unit. Oriented to room, call bell, and staff. Bed in lowest position. Fall safety plan reviewed. Full assessment to Epic. Skin assessment verified with --Tennis Must. RN---. Telemetry box verification with tele clerk and NT- Box#: --[id number]---. Will continue to monitor.

## 2015-03-26 NOTE — Telephone Encounter (Signed)
Pt is in hospital.

## 2015-03-26 NOTE — Progress Notes (Signed)
[facility] [place] at [employer]   PATIENT NAME: Laurie Larsen    MR#:  [id number]  DATE OF BIRTH:  02-Feb-1923  SUBJECTIVE:  CHIEF COMPLAINT:  Pt is feeling fine , denies any sob or chest pain Urine output is 100 mL overnight per RN report  REVIEW OF SYSTEMS:  CONSTITUTIONAL: No fever, fatigue or weakness.  EYES: No blurred or double vision.  EARS, NOSE, AND THROAT: No tinnitus or ear pain.  RESPIRATORY: No cough, shortness of breath, wheezing or hemoptysis.  CARDIOVASCULAR: No chest pain, orthopnea, edema.  GASTROINTESTINAL: No nausea, vomiting, diarrhea or abdominal pain.  GENITOURINARY: No dysuria, hematuria.  ENDOCRINE: No polyuria, nocturia,  HEMATOLOGY: No anemia, easy bruising or bleeding SKIN: No rash or lesion. MUSCULOSKELETAL: No joint pain or arthritis.   NEUROLOGIC: No tingling, numbness, weakness.  PSYCHIATRY: No anxiety or depression.   DRUG ALLERGIES:   Allergies  Allergen Reactions  . Amoxicillin Other (See Comments)    Reaction:  Weakness  . Cetylpyridinium Other (See Comments)    weakness  . Digoxin Other (See Comments)    Reaction:  Weakness and dehydration  . Risedronate Sodium Rash    VITALS:  Blood pressure 136/71, pulse 91, temperature 99 F (37.2 C), temperature source Oral, resp. rate 20, height  (1.626 m), weight 47.5 kg (104 lb 11.5 oz), SpO2 95 %.  PHYSICAL EXAMINATION:  GENERAL:  82 y.o.-year-old patient lying in the bed with no acute distress.  EYES: Pupils equal, round, reactive to light and accommodation. No scleral icterus. Extraocular muscles intact.  HEENT: Head atraumatic, normocephalic. Oropharynx and nasopharynx clear.  NECK:  Supple, no jugular venous distention. No thyroid enlargement, no tenderness.  LUNGS: Normal breath sounds bilaterally, no wheezing, rales,rhonchi or crepitation. Few crackles. No use of accessory muscles of respiration.  CARDIOVASCULAR: S1, S2 normal. No murmurs, rubs, or  gallops.  ABDOMEN: Soft, nontender, nondistended. Bowel sounds present. No organomegaly or mass.  EXTREMITIES: No pedal edema, cyanosis, or clubbing.  NEUROLOGIC: Cranial nerves II through XII are intact. Muscle strength 5/5 in all extremities. Sensation intact. Gait not checked.  PSYCHIATRIC: The patient is alert and oriented x 3.  SKIN: No obvious rash, lesion, or ulcer.    LABORATORY PANEL:   CBC  Recent Labs Lab 03/26/15 0502  WBC 15.1*  HGB 12.4  HCT 36.8  PLT 122*   ------------------------------------------------------------------------------------------------------------------  Chemistries   Recent Labs Lab 03/26/15 0502  NA 135  K 3.8  CL 103  CO2 22  GLUCOSE 89  BUN 21*  CREATININE 1.15*  CALCIUM 7.6*  AST 26  ALT 14  ALKPHOS 73  BILITOT 1.1   ------------------------------------------------------------------------------------------------------------------  Cardiac Enzymes  Recent Labs Lab 03/25/15 2302  TROPONINI 0.05*   ------------------------------------------------------------------------------------------------------------------  RADIOLOGY:  Dg Chest Port 1 View  03/26/2015  CLINICAL DATA:  Respiratory failure EXAM: PORTABLE CHEST 1 VIEW COMPARISON:  March 25, 2015 FINDINGS: There is airspace consolidation throughout the right middle and lower lobe regions, increased from 1 day prior. There is atelectasis in the left lower lobe. There is mild underlying interstitial edema. There is a right pleural effusion. Heart is mildly enlarged with mild pulmonary venous hypertension. Pacemaker leads are attached to the right atrium and right ventricle. No adenopathy is apparent. No pneumothorax. IMPRESSION: Congestive heart failure persists. Increased consolidation right middle and lower lobes, likely superimposed pneumonia. Atelectasis left lower lobe; question early pneumonia in this area as well. Cardiac silhouette unchanged. Electronically Signed   By:  Chrissie Noa  Margarita Grizzle III M.D.   On: 03/26/2015 07:26   Dg Chest Port 1 View  03/25/2015  CLINICAL DATA:  Hypoxia, difficulty breathing. EXAM: PORTABLE CHEST 1 VIEW COMPARISON:  01/22/2015 FINDINGS: Bilateral interstitial and airspace opacities with cardiomegaly. This could reflect mild edema. Bibasilar atelectasis with small layering effusions. Left pacer is unchanged. IMPRESSION: Suspect mild interstitial edema. Bibasilar atelectasis with small effusions. Electronically Signed   By: Charlett Nose M.D.   On: 03/25/2015 08:16    EKG:   Orders placed or performed in visit on 03/08/15  . EKG 12-Lead    ASSESSMENT AND PLAN:    #Acute hypoxic respiratory failure secondary to positive influenza A/probably superimposed pneumonia Patient with the decreased urine output probably from diuresis Will hold Lasix and provide gentle hydration with close monitoring for symptoms of fluid overload Tamiflu 75 mg by mouth twice a day Droplet precautions Lasix IV on hold because of hypotension and low urine output and on Antibiotics Pulmonology consult is appreciated Provide nebulizer treatments as needed Repeat chest x-ray  #Acute on chronic congestive heart failure Monitor intake and output, daily weights Low-salt intake is recommended Lasix-on hold Echocardiogram and cardiology consult is placed to left lower cardiology  #Influenza A with possible superimposed pneumonia Chest x-ray shows bilateral airspace opacities which is probably from pneumonia Pulmonology started patient on doxycycline We will obtain sputum culture and sensitivity  #Hypertension currently blood pressure is soft Will consider adding calcium channel blocker blood pressure is elevated  #Chronic atrial fibrillation Rate controlled. Continue beta blocker and Coumadin. INR is supra therapeutic at 4.37 Hold Coumadin   DVT prophylaxis holding Coumadin, INR is supra therapeutic, Ted's     All the records are reviewed and case  discussed with Care Management/Social Workerr. Management plans discussed with the patient, family and they are in agreement.  CODE STATUS: fc  TOTAL TIME TAKING CARE OF THIS PATIENT: 35 minutes.   POSSIBLE D/C IN 1-2  DAYS, DEPENDING ON CLINICAL CONDITION.   Ramonita Lab M.D on 03/26/2015 at 3:45 PM  Between 7am to 6pm - Pager - [phone] After 6pm go to [email or web address] - password [email or web address] [facility]  [facility]  Office  [phone]  CC: Primary care physician; Wynona Dove, MD

## 2015-03-26 NOTE — Progress Notes (Signed)
Called  And spoke w/ Dr. Anne Hahn R/t pt.'s hypotension and low UOP. (see EMR for details). Discussed pt. 's Hx and I&O's. MD ordered 500 NS bolus and instructed to call back once completed to discuss maintenance fluid.

## 2015-03-26 NOTE — Telephone Encounter (Signed)
Please take appointment off schedule. I sent another message about adding a patient at that time

## 2015-03-26 NOTE — Telephone Encounter (Signed)
I removed pt from the schedule.

## 2015-03-26 NOTE — Progress Notes (Signed)
Pt. BP responded, lung sounds clear to diminished spoke with Dr. Anne Hahn MD ordered 50 mL/h maintenance fluid.

## 2015-03-26 NOTE — Telephone Encounter (Signed)
Pt in the hospital

## 2015-03-26 NOTE — Consult Note (Signed)
ANTICOAGULATION CONSULT NOTE   Pharmacy Consult for warfarin Indication: atrial fibrillation  Allergies  Allergen Reactions  . Amoxicillin Other (See Comments)    Reaction:  Weakness  . Cetylpyridinium Other (See Comments)    weakness  . Digoxin Other (See Comments)    Reaction:  Weakness and dehydration  . Risedronate Sodium Rash    Patient Measurements: Height:  (162.6 cm) Weight: 104 lb 11.5 oz (47.5 kg) IBW/kg (Calculated) : 54.7 Heparin Dosing Weight:   Vital Signs: Temp: 100.4 F (38 C) (02/27 0800) Temp Source: Oral (02/27 0800) BP: 126/98 mmHg (02/27 1100) Pulse Rate: 96 (02/27 1000)  Labs:  Recent Labs  03/25/15 0814 03/25/15 1033 03/25/15 1602 03/25/15 2302 03/26/15 0502  HGB 13.7  --   --   --  12.4  HCT 40.5  --   --   --  36.8  PLT 123*  --   --   --  122*  LABPROT 27.7*  --   --   --  40.6*  INR 2.63  --   --   --  4.37*  CREATININE 0.66  --   --   --  1.15*  TROPONINI <0.03 <0.03 0.04* 0.05*  --     Estimated Creatinine Clearance: 24.4 mL/min (by C-G formula based on Cr of 1.15).   Medical History: Past Medical History  Diagnosis Date  . Permanent atrial fibrillation (HCC)     a. Chronic Coumadin.  [name] HTN (hypertension)   . Chronic diastolic CHF (congestive heart failure), NYHA class 1 (HCC)     a. 04/2013 Echo: EF 50-55%, triv MR, mildly dil LA, PASP , mild TR, mild-mod RA dil.  [name] CAD (coronary artery disease)     a. 2005 Cath: 100% dLCX-->Med managed.  . Sick sinus syndrome (HCC)     a. 05/2008 s/p MDT EnRhythm DC PPM ([facility]).  . Chronic pain     a. ? due to arthritis  . Glaucoma   . Hyperlipidemia   . Diverticulitis large intestine   . Allergy   . Phlebitis     a. after pacemeker placement in 2010.  [name] Colon polyps   . Hypothyroidism   . Transfusion history     a. After childbirth 60 years ago (1957)  . Rash/skin eruption     a. Multiple episodes - wide distribution-intermittent, possibly drug rash.  . Pulmonary HTN  (HCC)     a. PASP on echo 04/2013.  [name] Chronic Right Pleural Effusion     a. 07/2012 s/p thoracentesis - Followed by Dr. Delford Field [facility] Pulmonology.  . Arthritis   . Bronchitis   . Hay fever   . Colon polyps   . History of blood transfusion   . Urine incontinence   . Back complaints   . Acalculous cholecystitis   . CHF (congestive heart failure) (HCC)     Medications:  Scheduled:  . atenolol  25 mg Oral BID  . atorvastatin  20 mg Oral QHS  . docusate sodium  100 mg Oral BID  . dorzolamide-timolol  1 drop Both Eyes BID  . doxycycline  100 mg Oral Q12H  . latanoprost  1 drop Both Eyes QHS  . levothyroxine  112 mcg Oral QAC breakfast  . mirtazapine  15 mg Oral QHS  . oseltamivir  30 mg Oral BID  . polyethylene glycol  17 g Oral QHS  . potassium chloride  10 mEq Oral BID  . sodium chloride flush  3  mL Intravenous Q12H  . sodium chloride flush  3 mL Intravenous Q12H    Assessment: Pt is a 82 year old female with a PMH of afib, on warfarin. Home dose is  daily. Patient ordered doxycycline.   Goal of Therapy:  INR 2-3 Monitor platelets by anticoagulation protocol: Yes   Plan:  Patient with elevated INR. Will hold warfarin and recheck electrolytes with am labs.   Pharmacy will continue to monitor and adjust per consult.    Laurie Larsen  03/26/2015,12:07 PM

## 2015-03-26 NOTE — Care Management (Signed)
Presents from [facility].  Prior to this episode of illness, independent in her adls.   She has tested positive for flu as has her husband who is currently inpatient on 1A.  She was placed in icu due to need to monitor her respiratory status closely.  She is currently on 4 liters nasal cannula.

## 2015-03-26 NOTE — Progress Notes (Signed)
Pt. SBP dropped in 70's, MAP in high 50's. Called Dr. Anne Hahn, pt. Increased to 100 mL/h to maintain BP and support UOP.  Pt. Asymptomatic of FVE at this time.

## 2015-03-26 NOTE — Care Management (Signed)
Patient does have chronic home 02 at 2 liters.  Her 02 liter flow is still at 4 liters and has has a heart rate up to 141 today.  CSW reported that patient does not wish to pursue any skilled nursing placement at [facility].  At present she has not been ambulated.

## 2015-03-26 NOTE — Progress Notes (Addendum)
Dr. Amado Coe aware of urine output overnight and so far this shift. Ordered to leave foley in place for strict I&Os and restart NS fluids at 160mL/hr for 10 hours. MD aware that patient has listed history of CHF. Will monitor closely.

## 2015-03-27 ENCOUNTER — Inpatient Hospital Stay (HOSPITAL_BASED_OUTPATIENT_CLINIC_OR_DEPARTMENT_OTHER)
Admit: 2015-03-27 | Discharge: 2015-03-27 | Disposition: A | Discharge status: Home / Self Care | Payer: Medicare Other | Attending: Internal Medicine | Admitting: Internal Medicine

## 2015-03-27 ENCOUNTER — Inpatient Hospital Stay: Payer: Medicare Other

## 2015-03-27 DIAGNOSIS — I4891 Unspecified atrial fibrillation: Secondary | ICD-10-CM | POA: Diagnosis not present

## 2015-03-27 LAB — BASIC METABOLIC PANEL
ANION GAP: 6 (ref 5–15)
BUN: 26 mg/dL — AB (ref 6–20)
CALCIUM: 7.6 mg/dL — AB (ref 8.9–10.3)
CO2: 23 mmol/L (ref 22–32)
Chloride: 107 mmol/L (ref 101–111)
Creatinine, Ser: 0.79 mg/dL (ref 0.44–1.00)
GFR calc Af Amer: >60 mL/min (ref >60)
GLUCOSE: 105 mg/dL — AB (ref 65–99)
Potassium: 4.2 mmol/L (ref 3.5–5.1)
SODIUM: 136 mmol/L (ref 135–145)

## 2015-03-27 LAB — CBC
HCT: 32.7 % — ABNORMAL LOW (ref 35.0–47.0)
Hemoglobin: 11 g/dL — ABNORMAL LOW (ref 12.0–16.0)
MCH: 30.5 pg (ref 26.0–34.0)
MCHC: 33.7 g/dL (ref 32.0–36.0)
MCV: 90.5 fL (ref 80.0–100.0)
PLATELETS: 128 10*3/uL — AB (ref 150–440)
RBC: 3.61 MIL/uL — AB (ref 3.80–5.20)
RDW: 15.2 % — AB (ref 11.5–14.5)
WBC: 11.7 10*3/uL — AB (ref 3.6–11.0)

## 2015-03-27 LAB — PROTIME-INR
INR: 3.76
PROTHROMBIN TIME: 36.3 s — AB (ref 11.4–15.0)

## 2015-03-27 MED ORDER — BENZONATATE 100 MG PO CAPS
200.0000 mg | ORAL_CAPSULE | Freq: Three times a day (TID) | ORAL | Status: DC | PRN
  Administered 2015-03-27 – 2015-03-29 (×3): 200 mg via ORAL
  Filled 2015-03-27 (×3): qty 2

## 2015-03-27 MED ORDER — WARFARIN - PHARMACIST DOSING INPATIENT
Freq: Every day | Status: DC
  Administered 2015-03-27: 18:00:00

## 2015-03-27 MED ORDER — GUAIFENESIN 100 MG/5ML PO SOLN
5.0000 mL | ORAL | Status: DC | PRN
  Administered 2015-03-27 – 2015-03-29 (×4): 100 mg via ORAL
  Filled 2015-03-27 (×4): qty 10

## 2015-03-27 MED ORDER — GUAIFENESIN-DM 100-10 MG/5ML PO SYRP: 5.0000 mL | ORAL_SOLUTION | ORAL | Status: DC | PRN

## 2015-03-27 NOTE — Consult Note (Signed)
ANTICOAGULATION CONSULT NOTE   Pharmacy Consult for warfarin Indication: atrial fibrillation  Allergies  Allergen Reactions  . Amoxicillin Other (See Comments)    Reaction:  Weakness  . Cetylpyridinium Other (See Comments)    weakness  . Digoxin Other (See Comments)    Reaction:  Weakness and dehydration  . Risedronate Sodium Rash    Patient Measurements: Height:  (162.6 cm) Weight: 104 lb 3.2 oz (47.265 kg) IBW/kg (Calculated) : 54.7  Vital Signs: Temp: 97.6 F (36.4 C) (02/28 0528) BP: 101/53 mmHg (02/28 0528) Pulse Rate: 87 (02/28 0528)  Labs:  Recent Labs  03/25/15 0814 03/25/15 1033 03/25/15 1602 03/25/15 2302 03/26/15 0502 03/27/15 0439  HGB 13.7  --   --   --  12.4 11.0*  HCT 40.5  --   --   --  36.8 32.7*  PLT 123*  --   --   --  122* 128*  LABPROT 27.7*  --   --   --  40.6* 36.3*  INR 2.63  --   --   --  4.37* 3.76  CREATININE 0.66  --   --   --  1.15* 0.79  TROPONINI <0.03 <0.03 0.04* 0.05*  --   --     Estimated Creatinine Clearance: 34.9 mL/min (by C-G formula based on Cr of 0.79).   Medical History: Past Medical History  Diagnosis Date  . Permanent atrial fibrillation (HCC)     a. Chronic Coumadin.  [name] HTN (hypertension)   . Chronic diastolic CHF (congestive heart failure), NYHA class 1 (HCC)     a. 04/2013 Echo: EF 50-55%, triv MR, mildly dil LA, PASP , mild TR, mild-mod RA dil.  [name] CAD (coronary artery disease)     a. 2005 Cath: 100% dLCX-->Med managed.  . Sick sinus syndrome (HCC)     a. 05/2008 s/p MDT EnRhythm DC PPM ([facility]).  . Chronic pain     a. ? due to arthritis  . Glaucoma   . Hyperlipidemia   . Diverticulitis large intestine   . Allergy   . Phlebitis     a. after pacemeker placement in 2010.  [name] Colon polyps   . Hypothyroidism   . Transfusion history     a. After childbirth 60 years ago (1957)  . Rash/skin eruption     a. Multiple episodes - wide distribution-intermittent, possibly drug rash.  . Pulmonary HTN  (HCC)     a. PASP on echo 04/2013.  [name] Chronic Right Pleural Effusion     a. 07/2012 s/p thoracentesis - Followed by Dr. Delford Field [facility] Pulmonology.  . Arthritis   . Bronchitis   . Hay fever   . Colon polyps   . History of blood transfusion   . Urine incontinence   . Back complaints   . Acalculous cholecystitis   . CHF (congestive heart failure) (HCC)     Medications:  Scheduled:  . atenolol  25 mg Oral BID  . atorvastatin  20 mg Oral QHS  . docusate sodium  100 mg Oral BID  . dorzolamide-timolol  1 drop Both Eyes BID  . doxycycline  100 mg Oral Q12H  . feeding supplement (ENSURE ENLIVE)  237 mL Oral BID WC  . latanoprost  1 drop Both Eyes QHS  . levothyroxine  112 mcg Oral QAC breakfast  . mirtazapine  15 mg Oral QHS  . oseltamivir  30 mg Oral BID  . polyethylene glycol  17 g Oral QHS  .  potassium chloride  10 mEq Oral BID  . sodium chloride flush  3 mL Intravenous Q12H  . sodium chloride flush  3 mL Intravenous Q12H    Assessment: Pt is a 82 year old female with a PMH of afib, on warfarin. Home dose is  daily. Patient ordered doxycycline.   Goal of Therapy:  INR 2-3 Monitor platelets by anticoagulation protocol: Yes   Plan:  Patient still with elevated INR (4.37-->3.76). Will hold warfarin and recheck INR with am labs.   Pharmacy will continue to monitor and adjust per consult.    Roque Cash, PharmD Pharmacy Resident 03/27/2015

## 2015-03-27 NOTE — Progress Notes (Signed)
Chest CT results called to [facility]. Pulmonology has been consulted, but have not seen patient yet. No further orders, patient in NAD, o2 sats WNL.  Pulmonology also paged and made aware, no further orders from them as well. Will continue to monitor.

## 2015-03-27 NOTE — Progress Notes (Signed)
CSW spoke to MD and requested a PT Evaluation. Reports that she'll place one. CSW will continue to follow and assist.   Woodroe Mode, MSW, LCSW-A Clinical Social Work Department [phone]

## 2015-03-27 NOTE — Progress Notes (Signed)
[facility] Physicians - [place] at [facility]   PATIENT NAME: Laurie Larsen    MR#:  [id number]  DATE OF BIRTH:  [birth year]  SUBJECTIVE:  CHIEF COMPLAINT:  Pt is not feeling well, shortness of breath with minimal exertion Chest x-ray with a large pleural effusion which is loculated on the right  REVIEW OF SYSTEMS:  CONSTITUTIONAL: No fever, fatigue or weakness.  EYES: No blurred or double vision.  EARS, NOSE, AND THROAT: No tinnitus or ear pain.  RESPIRATORY: No cough, reports shortness of breath, no wheezing or hemoptysis.  CARDIOVASCULAR: No chest pain, orthopnea, edema.  GASTROINTESTINAL: No nausea, vomiting, diarrhea or abdominal pain.  GENITOURINARY: No dysuria, hematuria.  ENDOCRINE: No polyuria, nocturia,  HEMATOLOGY: No anemia, easy bruising or bleeding SKIN: No rash or lesion. MUSCULOSKELETAL: No joint pain or arthritis.   NEUROLOGIC: No tingling, numbness, weakness.  PSYCHIATRY: No anxiety or depression.   DRUG ALLERGIES:   Allergies  Allergen Reactions  . Amoxicillin Other (See Comments)    Reaction:  Weakness  . Cetylpyridinium Other (See Comments)    weakness  . Digoxin Other (See Comments)    Reaction:  Weakness and dehydration  . Risedronate Sodium Rash    VITALS:  Blood pressure 133/77, pulse 108, temperature 97.8 F (36.6 C), temperature source Oral, resp. rate 18, height  (1.626 m), weight 47.265 kg (104 lb 3.2 oz), SpO2 92 %.  PHYSICAL EXAMINATION:  GENERAL:  82 y.o.-year-old patient lying in the bed with no acute distress.  EYES: Pupils equal, round, reactive to light and accommodation. No scleral icterus. Extraocular muscles intact.  HEENT: Head atraumatic, normocephalic. Oropharynx and nasopharynx clear.  NECK:  Supple, no jugular venous distention. No thyroid enlargement, no tenderness.  LUNGS: Diminished breath sounds on the right side, no wheezing, rales,rhonchi or crepitation. Few crackles. No use of accessory muscles of  respiration.  CARDIOVASCULAR: S1, S2 normal. No murmurs, rubs, or gallops.  ABDOMEN: Soft, nontender, nondistended. Bowel sounds present. No organomegaly or mass.  EXTREMITIES: No pedal edema, cyanosis, or clubbing.  NEUROLOGIC: Cranial nerves II through XII are intact. Muscle strength 5/5 in all extremities. Sensation intact. Gait not checked.  PSYCHIATRIC: The patient is alert and oriented x 3.  SKIN: No obvious rash, lesion, or ulcer.    LABORATORY PANEL:   CBC  Recent Labs Lab 03/27/15 0439  WBC 11.7*  HGB 11.0*  HCT 32.7*  PLT 128*   ------------------------------------------------------------------------------------------------------------------  Chemistries   Recent Labs Lab 03/26/15 0502 03/27/15 0439  NA 135 136  K 3.8 4.2  CL 103 107  CO2 22 23  GLUCOSE 89 105*  BUN 21* 26*  CREATININE 1.15* 0.79  CALCIUM 7.6* 7.6*  AST 26  --   ALT 14  --   ALKPHOS 73  --   BILITOT 1.1  --    ------------------------------------------------------------------------------------------------------------------  Cardiac Enzymes  Recent Labs Lab 03/25/15 2302  TROPONINI 0.05*   ------------------------------------------------------------------------------------------------------------------  RADIOLOGY:  Dg Chest 2 View  03/26/2015  CLINICAL DATA:  Shortness of breath. Persistent cough. Congestive heart failure. EXAM: CHEST  2 VIEW COMPARISON:  03/26/2015, 03/25/2015, 01/22/2015 and chest CT dated 10/25/2012 FINDINGS: Pulmonary vascular congestion has resolved. Large right pleural effusion has slightly diminished. Areas of either loculated fluid or atelectasis/infiltrate are visible posteriorly on the lateral view. There is a small left pleural effusion with slight atelectasis at the left lung base, unchanged. Expansion Unchanged pacemaker. No acute bone abnormality. Extensive calcification in the aorta. IMPRESSION: Improved probable vascularity. Large right  pleural effusion,  slightly diminished. Area of atelectasis, infiltrate, or loculated fluid posteriorly, probably on the right. Electronically Signed   By: Francene Boyers M.D.   On: 03/26/2015 16:37   Dg Chest Port 1 View  03/26/2015  CLINICAL DATA:  Respiratory failure EXAM: PORTABLE CHEST 1 VIEW COMPARISON:  March 25, 2015 FINDINGS: There is airspace consolidation throughout the right middle and lower lobe regions, increased from 1 day prior. There is atelectasis in the left lower lobe. There is mild underlying interstitial edema. There is a right pleural effusion. Heart is mildly enlarged with mild pulmonary venous hypertension. Pacemaker leads are attached to the right atrium and right ventricle. No adenopathy is apparent. No pneumothorax. IMPRESSION: Congestive heart failure persists. Increased consolidation right middle and lower lobes, likely superimposed pneumonia. Atelectasis left lower lobe; question early pneumonia in this area as well. Cardiac silhouette unchanged. Electronically Signed   By: Bretta Bang III M.D.   On: 03/26/2015 07:26    EKG:   Orders placed or performed in visit on 03/08/15  . EKG 12-Lead    ASSESSMENT AND PLAN:    #Acute hypoxic respiratory failure secondary to positive influenza A/probably superimposed pneumonia with loculated right-sided pleural effusion Tamiflu 75 mg by mouth twice a day Droplet precautions Lasix IV on hold because of hypotension and low urine output and on Antibiotics Pulmonology consult is appreciated we'll get  a CT chest to evaluate possible right-sided loculated fluid pleural effusion and if needed will consult interventional radiology/Dr. Inez Catalina, Provide nebulizer treatments as needed Repeat chest x-ray   #Acute on chronic congestive heart failure Monitor intake and output, daily weights Low-salt intake is recommended Lasix-on hold Echocardiogram and cardiology consult is placed to [facility] cardiology  #Influenza A with possible  superimposed pneumonia Chest x-ray shows bilateral airspace opacities which is probably from pneumonia patient on doxycycline  sputum culture with many gram-positive cocci in pairs in clusters, gram-negative coccobacilli, gram-positive rods  #Hypertension currently blood pressure is soft Will consider adding calcium channel blocker blood pressure is elevated  #Chronic atrial fibrillation Rate controlled. Continue beta blocker and Coumadin. INR is supra therapeutic at 4.37--3.76 today  Hold Coumadin   DVT prophylaxis holding Coumadin, INR is supra therapeutic, Ted's     All the records are reviewed and case discussed with Care Management/Social Workerr. Management plans discussed with the patient, family and they are in agreement.  CODE STATUS: fc  TOTAL TIME TAKING CARE OF THIS PATIENT: 35 minutes.   POSSIBLE D/C IN 1-2  DAYS, DEPENDING ON CLINICAL CONDITION.   Ramonita Lab M.D on 03/27/2015 at 3:25 PM  Between 7am to 6pm - Pager - [phone] After 6pm go to [email or web address] - password [email or web address] [facility]  [facility] [place]  Office  [phone]  CC: Primary care physician; Wynona Dove, MD

## 2015-03-27 NOTE — Progress Notes (Signed)
Patient has yellow DNR in chart, however per Dr. Amado Coe family member (son) wants patient full code.

## 2015-03-27 NOTE — Care Management (Signed)
Patient will not converse with care manager this morning other than ask for CM to lower the head of her bed.  When asked if CM could call her family she said 'No- I do not feel good today."  She does not confirm that she has home 02.  She does acknowledge that her husband is also an inpatient.  She now has a foley for retention. Discussed physical therapy consult and mobility during progression.  It has been verbally reported to this CM that patient and her husband are to return to their independent living level of care with their current round the clock caregiver support.  Will attempt obtain permission to call family member to discuss discharge plan

## 2015-03-27 NOTE — Care Management Important Message (Signed)
Important Message  Patient Details  Name: Laurie Larsen MRN: [id number] Date of Birth: 1924-01-06   Medicare Important Message Given:  Yes    Eber Hong, RN 03/27/2015, 11:35 AM

## 2015-03-27 NOTE — Progress Notes (Signed)
Patient requested for cough syrup and Dr. Anne Hahn made aware with a new order for Tessalon 200 mg every 8 hours PRN and Robitussin 5 mg every 4 hours PRN. Patient looks drawsy  but rousable.. No acute distress noted. Patient denied pain. Will continue to monitor.

## 2015-03-28 ENCOUNTER — Inpatient Hospital Stay: Payer: Medicare Other

## 2015-03-28 LAB — GLUCOSE, SEROUS FLUID: Glucose, Fluid: 130 mg/dL

## 2015-03-28 LAB — LACTATE DEHYDROGENASE, PLEURAL OR PERITONEAL FLUID: LD FL: 81 U/L — AB (ref 3–23)

## 2015-03-28 LAB — BODY FLUID CELL COUNT WITH DIFFERENTIAL
EOS FL: 0 %
Lymphs, Fluid: 6 %
Monocyte-Macrophage-Serous Fluid: 14 %
NEUTROPHIL FLUID: 80 %
Other Cells, Fluid: 0 %
WBC FLUID: 191 uL

## 2015-03-28 LAB — PROTIME-INR
INR: 2.23
PROTHROMBIN TIME: 24.5 s — AB (ref 11.4–15.0)

## 2015-03-28 LAB — PROTEIN, BODY FLUID: Total protein, fluid: <3 g/dL

## 2015-03-28 MED ORDER — DEXTROSE 5 % IV SOLN
1.0000 g | INTRAVENOUS | Status: DC
  Administered 2015-03-28: 1 g via INTRAVENOUS
  Filled 2015-03-28 (×2): qty 10

## 2015-03-28 MED ORDER — WARFARIN SODIUM 1 MG PO TABS
3.0000 mg | ORAL_TABLET | Freq: Once | ORAL | Status: AC
  Administered 2015-03-28: 3 mg via ORAL
  Filled 2015-03-28: qty 3

## 2015-03-28 NOTE — Progress Notes (Signed)
[facility] Physicians - [place] at [facility]   PATIENT NAME: Laurie Larsen    MR#:  [id number]  DATE OF BIRTH:  01/01/1924  SUBJECTIVE:  CHIEF COMPLAINT:  Pt was seen today after ultrasound-guided paracentesis. Resting comfortably. Out of bed to chair. Shortness of breath is better but reporting weakness REVIEW OF SYSTEMS:  CONSTITUTIONAL: No fever, fatigue or weakness.  EYES: No blurred or double vision.  EARS, NOSE, AND THROAT: No tinnitus or ear pain.  RESPIRATORY: No cough, improved shortness of breath, no wheezing or hemoptysis.  CARDIOVASCULAR: No chest pain, orthopnea, edema.  GASTROINTESTINAL: No nausea, vomiting, diarrhea or abdominal pain.  GENITOURINARY: No dysuria, hematuria.  ENDOCRINE: No polyuria, nocturia,  HEMATOLOGY: No anemia, easy bruising or bleeding SKIN: No rash or lesion. MUSCULOSKELETAL: No joint pain or arthritis.   NEUROLOGIC: No tingling, numbness, weakness.  PSYCHIATRY: No anxiety or depression.   DRUG ALLERGIES:   Allergies  Allergen Reactions  . Amoxicillin Other (See Comments)    Reaction:  Weakness  . Cetylpyridinium Other (See Comments)    weakness  . Digoxin Other (See Comments)    Reaction:  Weakness and dehydration  . Risedronate Sodium Rash    VITALS:  Blood pressure 158/73, pulse 70, temperature 98.6 F (37 C), temperature source Oral, resp. rate 18, height  (1.626 m), weight 47.673 kg (105 lb 1.6 oz), SpO2 97 %.  PHYSICAL EXAMINATION:  GENERAL:  82 y.o.-year-old patient lying in the bed with no acute distress.  EYES: Pupils equal, round, reactive to light and accommodation. No scleral icterus. Extraocular muscles intact.  HEENT: Head atraumatic, normocephalic. Oropharynx and nasopharynx clear.  NECK:  Supple, no jugular venous distention. No thyroid enlargement, no tenderness.  LUNGS:   moderate air entry, no wheezing, rales,rhonchi or crepitation. Few crackles. No use of accessory muscles of respiration.   CARDIOVASCULAR: S1, S2 normal. No murmurs, rubs, or gallops.  ABDOMEN: Soft, nontender, nondistended. Bowel sounds present. No organomegaly or mass.  EXTREMITIES: No pedal edema, cyanosis, or clubbing.  NEUROLOGIC: Cranial nerves II through XII are intact. Muscle strength 5/5 in all extremities. Sensation intact. Gait not checked.  PSYCHIATRIC: The patient is alert and oriented x 3.  SKIN: No obvious rash, lesion, or ulcer.    LABORATORY PANEL:   CBC  Recent Labs Lab 03/27/15 0439  WBC 11.7*  HGB 11.0*  HCT 32.7*  PLT 128*   ------------------------------------------------------------------------------------------------------------------  Chemistries   Recent Labs Lab 03/26/15 0502 03/27/15 0439  NA 135 136  K 3.8 4.2  CL 103 107  CO2 22 23  GLUCOSE 89 105*  BUN 21* 26*  CREATININE 1.15* 0.79  CALCIUM 7.6* 7.6*  AST 26  --   ALT 14  --   ALKPHOS 73  --   BILITOT 1.1  --    ------------------------------------------------------------------------------------------------------------------  Cardiac Enzymes  Recent Labs Lab 03/25/15 2302  TROPONINI 0.05*   ------------------------------------------------------------------------------------------------------------------  RADIOLOGY:  X-ray Chest Pa Or Ap  03/28/2015  ADDENDUM REPORT: 03/28/2015 12:01 ADDENDUM: The impression should read:  No pneumothorax following thoracentesis Electronically Signed   By: Alcide Clever M.D.   On: 03/28/2015 12:01  03/28/2015  CLINICAL DATA:  Status post right thoracentesis EXAM: CHEST 1 VIEW COMPARISON:  03/26/2015 FINDINGS: Cardiac shadow remains enlarged. A pacing device is again seen. There is been significant reduction in right-sided pleural effusion when compared with the prior exam. No pneumothorax is noted. No focal confluent infiltrate is seen. No bony abnormality is noted. IMPRESSION: Pneumothorax following thoracentesis. Electronically  Signed: By: Alcide Clever M.D. On:  03/28/2015 11:49   Ct Chest Wo Contrast  03/27/2015  CLINICAL DATA:  Cough and dyspnea 2 days EXAM: CT CHEST WITHOUT CONTRAST TECHNIQUE: Multidetector CT imaging of the chest was performed following the standard protocol without IV contrast. COMPARISON:  None. FINDINGS: Mediastinum/Nodes: No axillary or supraclavicular lymphadenopathy. Mediastinal lymphadenopathy is noted. Precarinal lymph node measures 16 mm short axis. RIGHT upper paratracheal lymph node measures 12 mm. No pericardial fluid. Esophagus normal. Lungs/Pleura: Moderate RIGHT effusion. There is filling of the RIGHT lower lobe bronchus (image 33, series 3). Postobstructive collapse of the RIGHT lower lobe. Small focus of consolidation with air bronchograms in the medial aspect of the LEFT lower lobe. Mild airspace pattern within the lingula on image 24, series 6 Upper abdomen: No dilated loops of large or small bowel. No pathologic calcifications. No organomegaly. No aggressive osseous lesion. No intraperitoneal free air. Musculoskeletal: Degenerate change of the spine without acute findings IMPRESSION: 1. Endobronchial lesion in RIGHT lower lobe bronchus suggests mucoid impaction. There is postobstructive collapse of the RIGHT lower lobe. 2. Moderate RIGHT pleural effusion. 3. Small focus consolidation in the medial LEFT lower lobe and mild airspace disease in the lingula suggest RIGHT LEFT pulmonary infection or aspiration. 4. Mediastinal lymphadenopathy is likely reactive. Electronically Signed   By: Genevive Bi M.D.   On: 03/27/2015 17:31   US Thoracentesis Asp Pleural Space W/img Guide  03/28/2015  INDICATION: Right-sided pleural effusion EXAM: ULTRASOUND GUIDED THORACENTESIS MEDICATIONS: None. ANESTHESIA/SEDATION: None COMPLICATIONS: None immediate. PROCEDURE: An ultrasound guided thoracentesis was thoroughly discussed with the patient and questions answered. The benefits, risks, alternatives and complications were also discussed. The  patient understands and wishes to proceed with the procedure. Written consent was obtained. Ultrasound was performed to localize and mark an adequate pocket of fluid in the right chest. The area was then prepped and draped in the normal sterile fashion. 1% Lidocaine was used for local anesthesia. Under ultrasound guidance a 8 French thoracentesis catheter was introduced. Thoracentesis was performed. The catheter was removed and a dressing applied. FINDINGS: A total of approximately 900 mL of clear yellow fluid was removed. A fluid sample was sent for laboratory analysis. IMPRESSION: Successful ultrasound guided right thoracentesis yielding 900 mL of pleural fluid. Electronically Signed   By: Alcide Clever M.D.   On: 03/28/2015 11:59    EKG:   Orders placed or performed in visit on 03/08/15  . EKG 12-Lead    ASSESSMENT AND PLAN:    #Acute hypoxic respiratory failure secondary to positive influenza A/probably superimposed pneumonia with loculated right-sided pleural effusion Tamiflu 75 mg by mouth twice a day Droplet precautions Lasix IV on hold because of hypotension and low urine output and on Antibiotics Pulmonology consult is appreciated   status post ultrasound-guided right-sided thoracentesis Provide nebulizer treatments as needed Repeat chest x-ray   #Acute on chronic congestive heart failure Monitor intake and output, daily weights Low-salt intake is recommended Lasix-on hold Echocardiogram and cardiology consult is placed to [facility] cardiology  #Influenza A with possible superimposed pneumonia Chest x-ray shows bilateral airspace opacities which is probably from pneumonia patient on doxycycline  sputum culture with many gram-positive cocci in pairs in clusters, gram-negative coccobacilli, gram-positive rods  #Hypertension currently blood pressure is soft Will consider adding calcium channel blocker blood pressure is elevated  #Chronic atrial fibrillation Rate controlled.  Continue beta blocker and Coumadin. INR is supra therapeutic at 4.37--3.76--2.23 today  Resume Coumadin   PT is recommending home  health PT and 24 hour supervision  DVT prophylaxis Coumadin, INR is therapeutic, Ted's     All the records are reviewed and case discussed with Care Management/Social Workerr. Management plans discussed with the patient, family and they are in agreement.  CODE STATUS: fc  TOTAL TIME TAKING CARE OF THIS PATIENT: 35 minutes.   POSSIBLE D/C IN 1-2  DAYS, DEPENDING ON CLINICAL CONDITION.   Ramonita Lab M.D on 03/28/2015 at 5:13 PM  Between 7am to 6pm - Pager - [phone] After 6pm go to [email or web address] - password [email or web address] [facility]  [place] [facility]  Office  [phone]  CC: Primary care physician; Wynona Dove, MD

## 2015-03-28 NOTE — Procedures (Signed)
Right US thoracentesis without difficulty  Complications:  None  Blood Loss: none  See dictation in canopy pacs  

## 2015-03-28 NOTE — Consult Note (Signed)
ANTICOAGULATION CONSULT NOTE   Pharmacy Consult for warfarin Indication: atrial fibrillation  Allergies  Allergen Reactions  . Amoxicillin Other (See Comments)    Reaction:  Weakness  . Cetylpyridinium Other (See Comments)    weakness  . Digoxin Other (See Comments)    Reaction:  Weakness and dehydration  . Risedronate Sodium Rash    Patient Measurements: Height:  (162.6 cm) Weight: 105 lb 1.6 oz (47.673 kg) IBW/kg (Calculated) : 54.7  Vital Signs: Temp: 98.6 F (37 C) (03/01 1217) Temp Source: Oral (03/01 0457) BP: 158/73 mmHg (03/01 1217) Pulse Rate: 70 (03/01 1217)  Labs:  Recent Labs  03/25/15 1602 03/25/15 2302 03/26/15 0502 03/27/15 0439 03/28/15 0438  HGB  --   --  12.4 11.0*  --   HCT  --   --  36.8 32.7*  --   PLT  --   --  122* 128*  --   LABPROT  --   --  40.6* 36.3* 24.5*  INR  --   --  4.37* 3.76 2.23  CREATININE  --   --  1.15* 0.79  --   TROPONINI 0.04* 0.05*  --   --   --     Estimated Creatinine Clearance: 35.2 mL/min (by C-G formula based on Cr of 0.79).   Assessment: Pt is a 82 year old female with a PMH of afib, on warfarin. Home dose is  daily. Patient ordered doxycycline.   Goal of Therapy:  INR 2-3 Monitor platelets by anticoagulation protocol: Yes   Plan:  INR within therapeutic range today at 2.23. Will restart at lower dose of warfarin 3 mg PO x1 tonight. Follow up INR in AM.  Pharmacy will continue to monitor and adjust per consult.    Crist Fat, PharmD, BCPS Clinical Pharmacist 03/28/2015 3:41 PM

## 2015-03-28 NOTE — Progress Notes (Signed)
Catheter removed at 1330 Dressing on biliary tube clean and dry.

## 2015-03-28 NOTE — Evaluation (Signed)
Physical Therapy Evaluation Patient Details Name: Laurie Larsen MRN: [id number] DOB: October 04, 1923 Today's Date: 03/28/2015   History of Present Illness  Pt is admitted for positive flu with complaints of SOB and flu like symptoms. Pt with history of CAF, HTN, and wears 2L of O2 chronically.   Clinical Impression  Pt is a pleasant 82 year old female who was admitted for positive flu testing. Pt with thoracentesis this date. Pt received seated in chair upon arrival. Pt reports she just ambulated to bathroom with no AD and HHA of RN and is currently close to baseline status. Pt denies further attempts for mobility at this time. Pt agreeable for there-ex at this time. Pt demonstrates deficits with weakness/pain. Requesting pain meds at this time, RN aware. Would benefit from skilled PT to address above deficits and promote optimal return to PLOF. Pt will be able to dc home to ILF with 24/7 assist at this time. Recommend transition to HHPT upon discharge from acute hospitalization.       Follow Up Recommendations Home health PT;Supervision/Assistance - 24 hour    Equipment Recommendations       Recommendations for Other Services       Precautions / Restrictions Precautions Precautions: Fall Restrictions Weight Bearing Restrictions: No      Mobility  Bed Mobility               General bed mobility comments: not performed as pt received in recliner  Transfers                 General transfer comment: per pt, she just transferred to recliner from bed. Pt refuses further mobility attempts at this time  Ambulation/Gait                Stairs            Wheelchair Mobility    Modified Rankin (Stroke Patients Only)       Balance Overall balance assessment: Needs assistance Sitting-balance support: Feet supported                                         Pertinent Vitals/Pain Pain Assessment: No/denies pain    Home Living  Family/patient expects to be discharged to:: Private residence Living Arrangements: Spouse/significant other Available Help at Discharge: Family;Available 24 hours/day Type of Home: Independent living facility Home Access: Level entry     Home Layout: One level Home Equipment: Walker - 2 wheels      Prior Function Level of Independence: Independent with assistive device(s)         Comments: Patient uses a RW at baseline for short distance mobility.      Hand Dominance        Extremity/Trunk Assessment   Upper Extremity Assessment: Generalized weakness (grossly 4/5)           Lower Extremity Assessment: Generalized weakness (grossly 4/5)         Communication   Communication: No difficulties  Cognition Arousal/Alertness: Awake/alert Behavior During Therapy: WFL for tasks assessed/performed Overall Cognitive Status: Within Functional Limits for tasks assessed                      General Comments      Exercises Other Exercises Other Exercises: Seated ther-ex performed including B LE ankle pumps, quad sets, SLR, and hip abd/add. All ther-ex performed x 10  reps with cga. Safe technique performed      Assessment/Plan    PT Assessment Patient needs continued PT services  PT Diagnosis Difficulty walking;Generalized weakness   PT Problem List Decreased strength;Decreased activity tolerance;Decreased mobility  PT Treatment Interventions Gait training;Therapeutic exercise   PT Goals (Current goals can be found in the Care Plan section) Acute Rehab PT Goals Patient Stated Goal: to get stronger PT Goal Formulation: With patient Time For Goal Achievement: 04/11/15 Potential to Achieve Goals: Good    Frequency Min 2X/week   Barriers to discharge        Co-evaluation               End of Session Equipment Utilized During Treatment: Oxygen Activity Tolerance: Patient tolerated treatment well Patient left: in chair;with chair alarm set Nurse  Communication: Mobility status         Time: 1610-9604 PT Time Calculation (min) (ACUTE ONLY): 11 min   Charges:   PT Evaluation $PT Eval Low Complexity: 1 Procedure     PT G Codes:        Jeimy Bickert 2015-04-09, 4:07 PM Elizabeth Palau, PT, DPT [phone]

## 2015-03-29 LAB — BASIC METABOLIC PANEL
Anion gap: 4 — ABNORMAL LOW (ref 5–15)
BUN: 28 mg/dL — AB (ref 6–20)
CHLORIDE: 107 mmol/L (ref 101–111)
CO2: 28 mmol/L (ref 22–32)
CREATININE: 0.77 mg/dL (ref 0.44–1.00)
Calcium: 8.6 mg/dL — ABNORMAL LOW (ref 8.9–10.3)
GFR calc Af Amer: >60 mL/min (ref >60)
GFR calc non Af Amer: >60 mL/min (ref >60)
GLUCOSE: 87 mg/dL (ref 65–99)
POTASSIUM: 4.5 mmol/L (ref 3.5–5.1)
Sodium: 139 mmol/L (ref 135–145)

## 2015-03-29 LAB — CBC
HEMATOCRIT: 37.9 % (ref 35.0–47.0)
HEMOGLOBIN: 12.7 g/dL (ref 12.0–16.0)
MCH: 30.6 pg (ref 26.0–34.0)
MCHC: 33.4 g/dL (ref 32.0–36.0)
MCV: 91.5 fL (ref 80.0–100.0)
Platelets: 186 10*3/uL (ref 150–440)
RBC: 4.14 MIL/uL (ref 3.80–5.20)
RDW: 14.9 % — ABNORMAL HIGH (ref 11.5–14.5)
WBC: 5.5 10*3/uL (ref 3.6–11.0)

## 2015-03-29 LAB — PH, BODY FLUID: pH, Body Fluid: 7.9

## 2015-03-29 LAB — PROTIME-INR
INR: 1.58
PROTHROMBIN TIME: 18.9 s — AB (ref 11.4–15.0)

## 2015-03-29 LAB — TRIGLYCERIDES, BODY FLUIDS: Triglycerides, Fluid: 10 mg/dL

## 2015-03-29 MED ORDER — LEVOFLOXACIN 500 MG PO TABS: 500.0000 mg | ORAL_TABLET | Freq: Every day | ORAL | Status: DC

## 2015-03-29 MED ORDER — WARFARIN SODIUM 5 MG PO TABS: 5.0000 mg | ORAL_TABLET | Freq: Every day | ORAL | Status: DC

## 2015-03-29 MED ORDER — OSELTAMIVIR PHOSPHATE 30 MG PO CAPS: 30.0000 mg | ORAL_CAPSULE | Freq: Two times a day (BID) | ORAL | Status: AC

## 2015-03-29 MED ORDER — IPRATROPIUM-ALBUTEROL 0.5-2.5 (3) MG/3ML IN SOLN: 3.0000 mL | Freq: Four times a day (QID) | RESPIRATORY_TRACT | Status: AC

## 2015-03-29 MED ORDER — IPRATROPIUM-ALBUTEROL 0.5-2.5 (3) MG/3ML IN SOLN
3.0000 mL | RESPIRATORY_TRACT | Status: DC
  Administered 2015-03-29: 3 mL via RESPIRATORY_TRACT
  Filled 2015-03-29: qty 3

## 2015-03-29 MED ORDER — MOMETASONE FURO-FORMOTEROL FUM 100-5 MCG/ACT IN AERO
2.0000 | INHALATION_SPRAY | Freq: Two times a day (BID) | RESPIRATORY_TRACT | Status: DC
  Filled 2015-03-29: qty 8.8

## 2015-03-29 MED ORDER — DOXYCYCLINE HYCLATE 100 MG PO TABS: 100.0000 mg | ORAL_TABLET | Freq: Two times a day (BID) | ORAL | Status: DC

## 2015-03-29 MED ORDER — FUROSEMIDE 40 MG PO TABS: 40.0000 mg | ORAL_TABLET | Freq: Two times a day (BID) | ORAL | Status: DC

## 2015-03-29 MED ORDER — DOCUSATE SODIUM 100 MG PO CAPS: 100.0000 mg | ORAL_CAPSULE | Freq: Two times a day (BID) | ORAL | Status: AC

## 2015-03-29 MED ORDER — ENSURE ENLIVE PO LIQD: 237.0000 mL | Freq: Two times a day (BID) | ORAL | Status: AC

## 2015-03-29 MED ORDER — DILTIAZEM HCL ER COATED BEADS 180 MG PO CP24
180.0000 mg | ORAL_CAPSULE | Freq: Every day | ORAL | Status: DC
  Administered 2015-03-29: 180 mg via ORAL
  Filled 2015-03-29: qty 1

## 2015-03-29 MED ORDER — WARFARIN SODIUM 4 MG PO TABS
4.0000 mg | ORAL_TABLET | Freq: Once | ORAL | Status: AC
  Administered 2015-03-29: 4 mg via ORAL
  Filled 2015-03-29: qty 1

## 2015-03-29 MED ORDER — MOMETASONE FURO-FORMOTEROL FUM 100-5 MCG/ACT IN AERO: 2.0000 | INHALATION_SPRAY | Freq: Two times a day (BID) | RESPIRATORY_TRACT | Status: DC

## 2015-03-29 MED ORDER — BENZONATATE 200 MG PO CAPS: 200.0000 mg | ORAL_CAPSULE | Freq: Three times a day (TID) | ORAL | Status: DC | PRN

## 2015-03-29 MED ORDER — HYDROCODONE-ACETAMINOPHEN 10-325 MG PO TABS: 1.0000 | ORAL_TABLET | Freq: Three times a day (TID) | ORAL | Status: DC | PRN

## 2015-03-29 NOTE — Care Management Important Message (Signed)
Important Message  Patient Details  Name: Laurie Larsen MRN: [id number] Date of Birth: 02-Apr-1923   Medicare Important Message Given:  Yes    Eber Hong, RN 03/29/2015, 9:53 AM

## 2015-03-29 NOTE — Progress Notes (Signed)
Patient presently resting in the bed, remains alert and oriented, denies pain at this time, vss mood calm, contact precaution maintained. Patient being discharged to discharge home with home health, report called care provider

## 2015-03-29 NOTE — Consult Note (Signed)
CXR reviewed, CT chest ordered, US guided thoracentesis ordered, Fluid analysis c/w Transudative Process.  No further recommendations at this time  Patient with Influenza A pneumonia with superimposed bacterial pneumonia  1.oxygen as needed 2.continue ABX as prescribed 3.BD therapy as needed  No further recommendations

## 2015-03-29 NOTE — Discharge Instructions (Signed)
Activity as tolerated, as recommended by home PT, continue 2 L of oxygen via nasal cannula Diet heart healthy Outpatient follow-up with CHF clinic in a week Outpatient follow-up with primary care physician in a week, PCP to consider repeating PT/INR in 2-3 days Outpatient follow-up with [facility] in a week Outpatient follow-up with pulmonology Dr. Belia Heman  in 2 weeks Continue biliary drain dressing care as per recommendations

## 2015-03-29 NOTE — Care Management (Signed)
Spoke with patient's son Laurie Larsen.  He has been notified of discharge and in agreement.  Agency preference for home health is [facility].  Referral for SN PT and aide made.  Spoke with Rebeca Alert from [facility] and she will arrange transport. Discussed the need  to bring patient's portable oxygen for transport home.

## 2015-03-29 NOTE — Progress Notes (Signed)
Patient rested quietly tonight with no complaints of pain and no signs of discomfort or distress noted. A&O to person/place, VSS, and A-fib on tele. Nursing staff will continue to monitor. Lamonte Richer, RN

## 2015-03-29 NOTE — Discharge Summary (Signed)
[facility] -  at [facility]   PATIENT NAME: Laurie Larsen    MR#:  [id number]  DATE OF BIRTH:  1923-09-19  DATE OF ADMISSION:  03/25/2015 ADMITTING PHYSICIAN: Ramonita Lab, MD  DATE OF DISCHARGE: 03/29/15   PRIMARY CARE PHYSICIAN: Wynona Dove, MD    ADMISSION DIAGNOSIS:  Hypoxia [R09.02] Acute on chronic congestive heart failure, unspecified congestive heart failure type (HCC) [I50.9]  DISCHARGE DIAGNOSIS:  Acute hypoxic respiratory failure secondary to influenza A and pneumonia  from Klebsiella Acute on chronic congestive heart failure Chronic atrial fibrillation  SECONDARY DIAGNOSIS:   Past Medical History  Diagnosis Date  . Permanent atrial fibrillation (HCC)     a. Chronic Coumadin.  [name] HTN (hypertension)   . Chronic diastolic CHF (congestive heart failure), NYHA class 1 (HCC)     a. 04/2013 Echo: EF 50-55%, triv MR, mildly dil LA, PASP , mild TR, mild-mod RA dil.  [name] CAD (coronary artery disease)     a. 2005 Cath: 100% dLCX-->Med managed.  . Sick sinus syndrome (HCC)     a. 05/2008 s/p MDT EnRhythm DC PPM ([facility]).  . Chronic pain     a. ? due to arthritis  . Glaucoma   . Hyperlipidemia   . Diverticulitis large intestine   . Allergy   . Phlebitis     a. after pacemeker placement in 2010.  [name] Colon polyps   . Hypothyroidism   . Transfusion history     a. After childbirth 60 years ago (1957)  . Rash/skin eruption     a. Multiple episodes - wide distribution-intermittent, possibly drug rash.  . Pulmonary HTN (HCC)     a. PASP on echo 04/2013.  [name] Chronic Right Pleural Effusion     a. 07/2012 s/p thoracentesis - Followed by Dr. Delford Field [facility] Pulmonology.  . Arthritis   . Bronchitis   . Hay fever   . Colon polyps   . History of blood transfusion   . Urine incontinence   . Back complaints   . Acalculous cholecystitis   . CHF (congestive heart failure) [facility])     HOSPITAL COURSE:   # Acute hypoxic  respiratory failure secondary to positive influenza A/probably superimposed pneumonia with loculated right-sided pleural effusion Status post ultrasound-guided thoracentesis, fluid analysis and culture negative to be followed up by the primary care physician Sputum cultures with klebsiella pansensitive, growing gram-negative bacteria  PCP to follow up on final culture report will discharge patient with doxycycline and levofloxacin Tamiflu 30 mg  by mouth twice a day Droplet precautions implemented during the hospital course Lasix IV on hold because of hypotension and low urine output and on Antibiotics, which is resolved at this time and resuming Lasix at a lower dose 40 mg by mouth twice a day  Pulmonology consult is appreciated  Provide nebulizer treatments as needed Outpatient follow-up with cardiology and pulmonology is recommended    #Acute on chronic congestive heart failure Monitor intake and output, daily weights Low-salt intake is recommended Lasix-resumed at low dose  Echocardiogram  left ventricular ejection fraction 55-60%  Outpatient follow-up with [facility]  #Influenza A with possible superimposed pneumonia Chest x-ray shows bilateral airspace opacities which is probably from pneumonia patient on doxycycline sputum culture with many gram-positive cocci in pairs in clusters, gram-negative coccobacilli, gram-positive rods-Klebsiella pansensitive , PCP to follow up on the final result   #Hypertension currently blood pressure is soft Will consider adding calcium channel blocker blood pressure is elevated  #  Chronic atrial fibrillation Rate controlled. Continue beta blocker and Coumadin. INR is supra therapeutic at 4.37--3.76--2.23-1.58 today  Resume Coumadin-INR is subtherapeutic , PCP to repeat PT/INR in follow-up on the results and Coumadin management   PT is recommending home health PT and 24 hour supervision  DVT prophylaxis Coumadin, Ted's   DISCHARGE  CONDITIONS:   fair  CONSULTS OBTAINED:  Treatment Team:  Ramonita Lab, MD   PROCEDURES US guided rt thoracentesis  DRUG ALLERGIES:   Allergies  Allergen Reactions  . Amoxicillin Other (See Comments)    Reaction:  Weakness  . Cetylpyridinium Other (See Comments)    weakness  . Digoxin Other (See Comments)    Reaction:  Weakness and dehydration  . Risedronate Sodium Rash    DISCHARGE MEDICATIONS:   Current Discharge Medication List    CONTINUE these medications which have NOT CHANGED   Details  acetaminophen (TYLENOL) 650 MG CR tablet Take 1,300 mg by mouth at bedtime as needed for pain.    atenolol (TENORMIN) 25 MG tablet Take 1 tablet (25 mg total) by mouth 2 (two) times daily. Qty: 180 tablet, Refills: 3    atorvastatin (LIPITOR) 40 MG tablet Take 0.5 tablets (20 mg total) by mouth at bedtime. Qty: 45 tablet, Refills: 3    cetirizine (ZYRTEC) 10 MG tablet Take 10 mg by mouth daily.    diltiazem (CARDIZEM CD) 180 MG 24 hr capsule TAKE ONE CAPSULE DAILY Qty: 90 capsule, Refills: 3    dorzolamide-timolol (COSOPT) 22.3-6.8 MG/ML ophthalmic solution Place 1 drop into both eyes 2 (two) times daily.     furosemide (LASIX) 40 MG tablet Take 60-80 mg by mouth See admin instructions. Take 2 tablets (80mg ) by mouth every other day and Take 1.5 tablets (60mg ) by mouth every other day, then alternate between the 2 doses.    HYDROcodone-acetaminophen (NORCO) 10-325 MG tablet Take 1 tablet by mouth 3 (three) times daily as needed for moderate pain. Qty: 90 tablet, Refills: 0    levothyroxine (SYNTHROID, LEVOTHROID) 112 MCG tablet Take 1 tablet (112 mcg total) by mouth daily. Qty: 90 tablet, Refills: 1    mirtazapine (REMERON) 15 MG tablet Take 1 tablet (15 mg total) by mouth at bedtime. Qty: 30 tablet, Refills: 3    OXYGEN Inhale 2 L/min into the lungs at bedtime.    polyethylene glycol (MIRALAX / GLYCOLAX) packet Take 17 g by mouth at bedtime.     potassium chloride  (K-DUR) 10 MEQ tablet Take 10 mEq by mouth 2 (two) times daily.    traMADol (ULTRAM) 50 MG tablet Take 1 tablet (50 mg total) by mouth 3 (three) times daily as needed. Qty: 90 tablet, Refills: 3    Travoprost, BAK Free, (TRAVATAN) 0.004 % SOLN ophthalmic solution Place 1 drop into both eyes at bedtime.    Vitamin D, Ergocalciferol, (DRISDOL) 50000 UNITS CAPS capsule Take 50,000 Units by mouth every 7 (seven) days. Pt takes on Saturday.    warfarin (COUMADIN) 5 MG tablet Take 1 tablet by mouth daily or as directed by coumadin clinic Qty: 90 tablet, Refills: 1         DISCHARGE INSTRUCTIONS:  Activity as tolerated, as recommended by home PT,aide  continue 2 L of oxygen via nasal cannula Diet heart healthy Outpatient follow-up with CHF clinic in a week Outpatient follow-up with primary care physician in a week, PCP to consider repeating PT/INR in 1-2 days Outpatient follow-up with [facility] in a week Outpatient follow-up with pulmonology Dr. Belia Heman  in 2 weeks Continue biliary drain dressing care as per recommendations    DIET:  Cardiac diet  DISCHARGE CONDITION:  Fair  ACTIVITY:  Activity as tolerated per  PT recommendations  OXYGEN:  Home Oxygen: Yes.     Oxygen Delivery: 2 liters/min via Patient connected to nasal cannula oxygen  DISCHARGE LOCATION:  home   If you experience worsening of your admission symptoms, develop shortness of breath, life threatening emergency, suicidal or homicidal thoughts you must seek medical attention immediately by calling 911 or calling your MD immediately  if symptoms less severe.  You Must read complete instructions/literature along with all the possible adverse reactions/side effects for all the Medicines you take and that have been prescribed to you. Take any new Medicines after you have completely understood and accpet all the possible adverse reactions/side effects.   Please note  You were cared for by a hospitalist during  your hospital stay. If you have any questions about your discharge medications or the care you received while you were in the hospital after you are discharged, you can call the unit and asked to speak with the hospitalist on call if the hospitalist that took care of you is not available. Once you are discharged, your primary care physician will handle any further medical issues. Please note that NO REFILLS for any discharge medications will be authorized once you are discharged, as it is imperative that you return to your primary care physician (or establish a relationship with a primary care physician if you do not have one) for your aftercare needs so that they can reassess your need for medications and monitor your lab values.     Today  Chief Complaint  Patient presents with  . Respiratory Distress   Patient is out of bed to chair. Resting comfortably. Denies any shortness of breath. Wants to go home. Lives on 2 L of oxygen via nasal cannula  ROS:  CONSTITUTIONAL: Denies fevers, chills. Denies any fatigue, weakness.  EYES: Denies blurry vision, double vision, eye pain. EARS, NOSE, THROAT: Denies tinnitus, ear pain, hearing loss. RESPIRATORY: Denies cough, wheeze, shortness of breath.  CARDIOVASCULAR: Denies chest pain, palpitations, edema.  GASTROINTESTINAL: Denies nausea, vomiting, diarrhea, abdominal pain. Denies bright red blood per rectum.right side of the abdomen with biliary drain with clean dressing  GENITOURINARY: Denies dysuria, hematuria. ENDOCRINE: Denies nocturia or thyroid problems. HEMATOLOGIC AND LYMPHATIC: Denies easy bruising or bleeding. SKIN: Denies rash or lesion. MUSCULOSKELETAL: Denies pain in neck, back, shoulder, knees, hips or arthritic symptoms.  NEUROLOGIC: Denies paralysis, paresthesias.  PSYCHIATRIC: Denies anxiety or depressive symptoms.   VITAL SIGNS:  Blood pressure 134/90, pulse 81, temperature 97.8 F (36.6 C), temperature source Oral, resp. rate  18, height  (1.626 m), weight 46.72 kg (103 lb), SpO2 96 %.  I/O:   Intake/Output Summary (Last 24 hours) at 03/29/15 1319 Last data filed at 03/29/15 1100  Gross per 24 hour  Intake     50 ml  Output   1675 ml  Net  -1625 ml    PHYSICAL EXAMINATION:  GENERAL:  82 y.o.-year-old patient lying in the bed with no acute distress.  EYES: Pupils equal, round, reactive to light and accommodation. No scleral icterus. Extraocular muscles intact.  HEENT: Head atraumatic, normocephalic. Oropharynx and nasopharynx clear.  NECK:  Supple, no jugular venous distention. No thyroid enlargement, no tenderness.  LUNGS: Normal breath sounds bilaterally, no wheezing, rales,rhonchi or crepitation. No use of accessory muscles of respiration.  CARDIOVASCULAR: S1, S2  normal. No murmurs, rubs, or gallops.  ABDOMEN: Soft, non-tender, non-distended. Bowel sounds present. No organomegaly or mass.  right-sided upper abdomen with clean dressing around the biliary drain  EXTREMITIES: No pedal edema, cyanosis, or clubbing.  NEUROLOGIC: Cranial nerves II through XII are intact. Muscle strength 5/5 in all extremities. Sensation intact. Gait not checked.  PSYCHIATRIC: The patient is alert and oriented x 3.  SKIN: No obvious rash, lesion, or ulcer.   DATA REVIEW:   CBC  Recent Labs Lab 03/29/15 0611  WBC 5.5  HGB 12.7  HCT 37.9  PLT 186    Chemistries   Recent Labs Lab 03/26/15 0502  03/29/15 0611  NA 135  < > 139  K 3.8  < > 4.5  CL 103  < > 107  CO2 22  < > 28  GLUCOSE 89  < > 87  BUN 21*  < > 28*  CREATININE 1.15*  < > 0.77  CALCIUM 7.6*  < > 8.6*  AST 26  --   --   ALT 14  --   --   ALKPHOS 73  --   --   BILITOT 1.1  --   --   < > = values in this interval not displayed.  Cardiac Enzymes  Recent Labs Lab 03/25/15 2302  TROPONINI 0.05*    Microbiology Results  Results for orders placed or performed during the hospital encounter of 03/25/15  Culture, blood (routine x 2)      Status: None (Preliminary result)   Collection Time: 03/25/15  8:14 AM  Result Value Ref Range Status   Specimen Description BLOOD LEFT ANTECUBITAL  Final   Special Requests BOTTLES DRAWN AEROBIC AND ANAEROBIC  1CC  Final   Culture NO GROWTH 3 DAYS  Final   Report Status PENDING  Incomplete  Culture, blood (routine x 2)     Status: None (Preliminary result)   Collection Time: 03/25/15  8:15 AM  Result Value Ref Range Status   Specimen Description BLOOD RIGHT ANTECUBITAL  Final   Special Requests BOTTLES DRAWN AEROBIC AND ANAEROBIC  2CC  Final   Culture NO GROWTH 3 DAYS  Final   Report Status PENDING  Incomplete  Culture, expectorated sputum-assessment     Status: None   Collection Time: 03/25/15  8:25 AM  Result Value Ref Range Status   Specimen Description SPUTUM  Final   Special Requests NONE  Final   Sputum evaluation THIS SPECIMEN IS ACCEPTABLE FOR SPUTUM CULTURE  Final   Report Status 03/26/2015 FINAL  Final  Culture, respiratory (NON-Expectorated)     Status: None (Preliminary result)   Collection Time: 03/25/15  8:25 AM  Result Value Ref Range Status   Specimen Description SPUTUM  Final   Special Requests NONE Reflexed from [id number]  Final   Gram Stain   Final    MANY WBC SEEN MANY GRAM POSITIVE COCCI IN PAIRS IN CLUSTERS MODERATE GRAM NEGATIVE COCCOBACILLI MANY GRAM POSITIVE RODS GOOD SPECIMEN - 80-90% WBCS    Culture   Final    LIGHT GROWTH KLEBSIELLA PNEUMONIAE MODERATE GROWTH GRAM NEGATIVE RODS IDENTIFICATION TO FOLLOW    Report Status PENDING  Incomplete   Organism ID, Bacteria KLEBSIELLA PNEUMONIAE  Final      Susceptibility   Klebsiella pneumoniae - MIC*    AMPICILLIN 16 RESISTANT Resistant     CEFAZOLIN <=4 SENSITIVE Sensitive     CEFEPIME <=1 SENSITIVE Sensitive     CEFTAZIDIME <=1 SENSITIVE Sensitive  CEFTRIAXONE <=1 SENSITIVE Sensitive     CIPROFLOXACIN <=0.25 SENSITIVE Sensitive     GENTAMICIN <=1 SENSITIVE Sensitive     IMIPENEM <=0.25 SENSITIVE  Sensitive     TRIMETH/SULFA <=20 SENSITIVE Sensitive     AMPICILLIN/SULBACTAM 4 SENSITIVE Sensitive     PIP/TAZO <=4 SENSITIVE Sensitive     Extended ESBL NEGATIVE Sensitive     * LIGHT GROWTH KLEBSIELLA PNEUMONIAE  MRSA PCR Screening     Status: None   Collection Time: 03/25/15 11:35 AM  Result Value Ref Range Status   MRSA by PCR NEGATIVE NEGATIVE Final    Comment:        The GeneXpert MRSA Assay (FDA approved for NASAL specimens only), is one component of a comprehensive MRSA colonization surveillance program. It is not intended to diagnose MRSA infection nor to guide or monitor treatment for MRSA infections.     RADIOLOGY:  X-ray Chest Pa Or Ap  03/28/2015  ADDENDUM REPORT: 03/28/2015 12:01 ADDENDUM: The impression should read:  No pneumothorax following thoracentesis Electronically Signed   By: Alcide Clever M.D.   On: 03/28/2015 12:01  03/28/2015  CLINICAL DATA:  Status post right thoracentesis EXAM: CHEST 1 VIEW COMPARISON:  03/26/2015 FINDINGS: Cardiac shadow remains enlarged. A pacing device is again seen. There is been significant reduction in right-sided pleural effusion when compared with the prior exam. No pneumothorax is noted. No focal confluent infiltrate is seen. No bony abnormality is noted. IMPRESSION: Pneumothorax following thoracentesis. Electronically Signed: By: Alcide Clever M.D. On: 03/28/2015 11:49   Dg Chest 2 View  03/26/2015  CLINICAL DATA:  Shortness of breath. Persistent cough. Congestive heart failure. EXAM: CHEST  2 VIEW COMPARISON:  03/26/2015, 03/25/2015, 01/22/2015 and chest CT dated 10/25/2012 FINDINGS: Pulmonary vascular congestion has resolved. Large right pleural effusion has slightly diminished. Areas of either loculated fluid or atelectasis/infiltrate are visible posteriorly on the lateral view. There is a small left pleural effusion with slight atelectasis at the left lung base, unchanged. Expansion Unchanged pacemaker. No acute bone abnormality.  Extensive calcification in the aorta. IMPRESSION: Improved probable vascularity. Large right pleural effusion, slightly diminished. Area of atelectasis, infiltrate, or loculated fluid posteriorly, probably on the right. Electronically Signed   By: Francene Boyers M.D.   On: 03/26/2015 16:37   Ct Chest Wo Contrast  03/27/2015  CLINICAL DATA:  Cough and dyspnea 2 days EXAM: CT CHEST WITHOUT CONTRAST TECHNIQUE: Multidetector CT imaging of the chest was performed following the standard protocol without IV contrast. COMPARISON:  None. FINDINGS: Mediastinum/Nodes: No axillary or supraclavicular lymphadenopathy. Mediastinal lymphadenopathy is noted. Precarinal lymph node measures 16 mm short axis. RIGHT upper paratracheal lymph node measures 12 mm. No pericardial fluid. Esophagus normal. Lungs/Pleura: Moderate RIGHT effusion. There is filling of the RIGHT lower lobe bronchus (image 33, series 3). Postobstructive collapse of the RIGHT lower lobe. Small focus of consolidation with air bronchograms in the medial aspect of the LEFT lower lobe. Mild airspace pattern within the lingula on image 24, series 6 Upper abdomen: No dilated loops of large or small bowel. No pathologic calcifications. No organomegaly. No aggressive osseous lesion. No intraperitoneal free air. Musculoskeletal: Degenerate change of the spine without acute findings IMPRESSION: 1. Endobronchial lesion in RIGHT lower lobe bronchus suggests mucoid impaction. There is postobstructive collapse of the RIGHT lower lobe. 2. Moderate RIGHT pleural effusion. 3. Small focus consolidation in the medial LEFT lower lobe and mild airspace disease in the lingula suggest RIGHT LEFT pulmonary infection or aspiration. 4. Mediastinal lymphadenopathy is  likely reactive. Electronically Signed   By: Genevive Bi M.D.   On: 03/27/2015 17:31   Dg Chest Port 1 View  03/26/2015  CLINICAL DATA:  Respiratory failure EXAM: PORTABLE CHEST 1 VIEW COMPARISON:  March 25, 2015  FINDINGS: There is airspace consolidation throughout the right middle and lower lobe regions, increased from 1 day prior. There is atelectasis in the left lower lobe. There is mild underlying interstitial edema. There is a right pleural effusion. Heart is mildly enlarged with mild pulmonary venous hypertension. Pacemaker leads are attached to the right atrium and right ventricle. No adenopathy is apparent. No pneumothorax. IMPRESSION: Congestive heart failure persists. Increased consolidation right middle and lower lobes, likely superimposed pneumonia. Atelectasis left lower lobe; question early pneumonia in this area as well. Cardiac silhouette unchanged. Electronically Signed   By: Bretta Bang III M.D.   On: 03/26/2015 07:26   US Thoracentesis Asp Pleural Space W/img Guide  03/28/2015  INDICATION: Right-sided pleural effusion EXAM: ULTRASOUND GUIDED THORACENTESIS MEDICATIONS: None. ANESTHESIA/SEDATION: None COMPLICATIONS: None immediate. PROCEDURE: An ultrasound guided thoracentesis was thoroughly discussed with the patient and questions answered. The benefits, risks, alternatives and complications were also discussed. The patient understands and wishes to proceed with the procedure. Written consent was obtained. Ultrasound was performed to localize and mark an adequate pocket of fluid in the right chest. The area was then prepped and draped in the normal sterile fashion. 1% Lidocaine was used for local anesthesia. Under ultrasound guidance a 8 French thoracentesis catheter was introduced. Thoracentesis was performed. The catheter was removed and a dressing applied. FINDINGS: A total of approximately 900 mL of clear yellow fluid was removed. A fluid sample was sent for laboratory analysis. IMPRESSION: Successful ultrasound guided right thoracentesis yielding 900 mL of pleural fluid. Electronically Signed   By: Alcide Clever M.D.   On: 03/28/2015 11:59    EKG:   Orders placed or performed in visit on  03/08/15  . EKG 12-Lead      Management plans discussed with the patient, family and they are in agreement.  CODE STATUS:     Code Status Orders        Start     Ordered   03/25/15 1104  Full code   Continuous     03/25/15 1104    Code Status History    Date Active Date Inactive Code Status Order ID Comments User Context   03/25/2015 11:03 AM 03/25/2015 11:04 AM DNR [id number]  Ramonita Lab, MD ED   10/17/2014  9:34 PM 10/24/2014  6:56 PM Full Code [id number]  Ricarda Frame, MD Inpatient   05/31/2014  4:55 PM 06/04/2014  5:10 PM DNR [id number]  Ramonita Lab, MD Inpatient    Advance Directive Documentation        Most Recent Value   Type of Advance Directive  Out of facility DNR (pink MOST or yellow form)   Pre-existing out of facility DNR order (yellow form or pink MOST form)     "MOST" Form in Place?        TOTAL TIME TAKING CARE OF THIS PATIENT: 45 minutes.    @[facility] @  on 03/29/2015 at 1:19 PM  Between 7am to 6pm - Pager - [phone]  After 6pm go to [email or web address] - password [email or web address] [facility]  [facility]  Office  [phone]  CC: Primary care physician; Wynona Dove, MD

## 2015-03-30 ENCOUNTER — Telehealth: Payer: Self-pay

## 2015-03-30 LAB — CULTURE, RESPIRATORY

## 2015-03-30 LAB — CULTURE, BLOOD (ROUTINE X 2)
CULTURE: NO GROWTH
Culture: NO GROWTH

## 2015-03-30 LAB — CULTURE, RESPIRATORY W GRAM STAIN

## 2015-03-30 LAB — CYTOLOGY - NON PAP

## 2015-03-30 NOTE — Telephone Encounter (Signed)
Transition Care Management Follow-up Telephone Call   Date discharged? 03/29/15   How have you been since you were released from the hospital? Moving slow.  Some SOB. No pain. No issues with eating, drinking, voiding or BM.  Little cough.   Do you understand why you were in the hospital? Yes,    Do you understand the discharge instructions? Yes, I am moving slow and using home oxygen at 2 L.  Home physical therapy.   Where were you discharged to? Home   Items Reviewed:  Medications reviewed: Yes, learning how to use the breathing medications and taking all scheduled medications ok.  Allergies reviewed: Yes, no changes.  Dietary changes reviewed: Yes, heart healthy, no problems  Referrals reviewed: Yes, cardiology, pulmonary and CHF clinic appointments made, per her.   Functional Questionnaire:   Activities of Daily Living (ADLs):   She states they are independent in the following: Toileting, feeding, grooming. States they require assistance with the following: Ambulating (uses walker), bathing, dressing, meal prep. Home health assessment in progress.   Any transportation issues/concerns?: No   Any patient concerns? Learning the new breathing medications.  Encouraged to bring medications to appointment.   Confirmed importance and date/time of follow-up visits scheduled Yes, appointment scheduled 04/04/15 at 11:00.  Provider Appointment booked with Dr. Dan HumphreysWalker (PCP).  Confirmed with patient if condition begins to worsen call PCP or go to the ER.  Patient was given the office number and encouraged to call back with question or concerns.  : Yes, patient verbalized understanding.

## 2015-04-02 ENCOUNTER — Ambulatory Visit: Payer: Medicare Other | Attending: Internal Medicine

## 2015-04-02 LAB — BODY FLUID CULTURE: CULTURE: NO GROWTH

## 2015-04-04 ENCOUNTER — Ambulatory Visit (INDEPENDENT_AMBULATORY_CARE_PROVIDER_SITE_OTHER): Payer: Medicare Other | Admitting: Internal Medicine

## 2015-04-04 ENCOUNTER — Emergency Department: Payer: Medicare Other

## 2015-04-04 ENCOUNTER — Encounter: Payer: Self-pay | Admitting: Internal Medicine

## 2015-04-04 ENCOUNTER — Emergency Department
Admission: EM | Admit: 2015-04-04 | Discharge: 2015-04-04 | Disposition: A | Discharge status: Home / Self Care | Payer: Medicare Other | Attending: Emergency Medicine | Admitting: Emergency Medicine

## 2015-04-04 ENCOUNTER — Encounter: Payer: Self-pay | Admitting: Emergency Medicine

## 2015-04-04 VITALS — BP 120/82 | HR 86 | Temp 97.5°F | Ht 63.0 in | Wt 99.0 lb

## 2015-04-04 DIAGNOSIS — Z79899 Other long term (current) drug therapy: Secondary | ICD-10-CM | POA: Insufficient documentation

## 2015-04-04 DIAGNOSIS — J9601 Acute respiratory failure with hypoxia: Secondary | ICD-10-CM

## 2015-04-04 DIAGNOSIS — Z7901 Long term (current) use of anticoagulants: Secondary | ICD-10-CM | POA: Insufficient documentation

## 2015-04-04 DIAGNOSIS — R710 Precipitous drop in hematocrit: Secondary | ICD-10-CM | POA: Insufficient documentation

## 2015-04-04 DIAGNOSIS — R112 Nausea with vomiting, unspecified: Secondary | ICD-10-CM | POA: Diagnosis not present

## 2015-04-04 DIAGNOSIS — Z880 Allergy status to penicillin: Secondary | ICD-10-CM | POA: Diagnosis not present

## 2015-04-04 DIAGNOSIS — E860 Dehydration: Secondary | ICD-10-CM | POA: Diagnosis not present

## 2015-04-04 DIAGNOSIS — I10 Essential (primary) hypertension: Secondary | ICD-10-CM | POA: Insufficient documentation

## 2015-04-04 DIAGNOSIS — J90 Pleural effusion, not elsewhere classified: Secondary | ICD-10-CM | POA: Diagnosis not present

## 2015-04-04 DIAGNOSIS — R630 Anorexia: Secondary | ICD-10-CM | POA: Insufficient documentation

## 2015-04-04 DIAGNOSIS — Z7951 Long term (current) use of inhaled steroids: Secondary | ICD-10-CM | POA: Diagnosis not present

## 2015-04-04 DIAGNOSIS — R531 Weakness: Secondary | ICD-10-CM | POA: Diagnosis present

## 2015-04-04 LAB — COMPREHENSIVE METABOLIC PANEL
ALK PHOS: 103 U/L (ref 39–117)
ALT: 22 U/L (ref 0–35)
ALT: 25 U/L (ref 14–54)
AST: 33 U/L (ref 0–37)
AST: 33 U/L (ref 15–41)
Albumin: 3.7 g/dL (ref 3.5–5.2)
Albumin: 3.5 g/dL (ref 3.5–5.0)
Alkaline Phosphatase: 104 U/L (ref 38–126)
Anion gap: 13 (ref 5–15)
BILIRUBIN TOTAL: 0.6 mg/dL (ref 0.2–1.2)
BILIRUBIN TOTAL: 0.8 mg/dL (ref 0.3–1.2)
BUN: 27 mg/dL — AB (ref 6–23)
BUN: 34 mg/dL — ABNORMAL HIGH (ref 6–20)
CALCIUM: 8.8 mg/dL — AB (ref 8.9–10.3)
CO2: 19 mEq/L (ref 19–32)
CO2: 22 mmol/L (ref 22–32)
CREATININE: 1.41 mg/dL — AB (ref 0.44–1.00)
Calcium: 9.1 mg/dL (ref 8.4–10.5)
Chloride: 101 mEq/L (ref 96–112)
Chloride: 100 mmol/L — ABNORMAL LOW (ref 101–111)
Creatinine, Ser: 1 mg/dL (ref 0.40–1.20)
GFR calc Af Amer: 37 mL/min — ABNORMAL LOW (ref >60)
GFR, EST NON AFRICAN AMERICAN: 32 mL/min — AB (ref >60)
GFR: 55.35 mL/min — AB (ref >60.00)
GLUCOSE: 92 mg/dL (ref 70–99)
Glucose, Bld: 116 mg/dL — ABNORMAL HIGH (ref 65–99)
POTASSIUM: 3.7 meq/L (ref 3.5–5.1)
POTASSIUM: 3.2 mmol/L — AB (ref 3.5–5.1)
SODIUM: 134 meq/L — AB (ref 135–145)
Sodium: 135 mmol/L (ref 135–145)
TOTAL PROTEIN: 7.2 g/dL (ref 6.0–8.3)
TOTAL PROTEIN: 7 g/dL (ref 6.5–8.1)

## 2015-04-04 LAB — TYPE AND SCREEN
ABO/RH(D): O POS
Antibody Screen: NEGATIVE

## 2015-04-04 LAB — PROTIME-INR
INR: 2.84
PROTHROMBIN TIME: 29.4 s — AB (ref 11.4–15.0)

## 2015-04-04 LAB — URINALYSIS COMPLETE WITH MICROSCOPIC (ARMC ONLY)
BACTERIA UA: NONE SEEN
BILIRUBIN URINE: NEGATIVE
Glucose, UA: NEGATIVE mg/dL
Hgb urine dipstick: NEGATIVE
LEUKOCYTES UA: NEGATIVE
NITRITE: NEGATIVE
Protein, ur: NEGATIVE mg/dL
SPECIFIC GRAVITY, URINE: 1.02 (ref 1.005–1.030)
Squamous Epithelial / LPF: NONE SEEN
pH: 5 (ref 5.0–8.0)

## 2015-04-04 LAB — CBC WITH DIFFERENTIAL/PLATELET
BASOS ABS: 0 10*3/uL (ref 0.0–0.1)
Basophils Relative: 0.3 % (ref 0.0–3.0)
Eosinophils Absolute: 0.2 10*3/uL (ref 0.0–0.7)
Eosinophils Relative: 1.6 % (ref 0.0–5.0)
HCT: 25.4 % — ABNORMAL LOW (ref 36.0–46.0)
Hemoglobin: 8.6 g/dL — ABNORMAL LOW (ref 12.0–15.0)
LYMPHS ABS: 1.1 10*3/uL (ref 0.7–4.0)
Lymphocytes Relative: 11.6 % — ABNORMAL LOW (ref 12.0–46.0)
MCHC: 34 g/dL (ref 30.0–36.0)
MCV: 90.1 fl (ref 78.0–100.0)
MONO ABS: 1.2 10*3/uL — AB (ref 0.1–1.0)
Monocytes Relative: 13.1 % — ABNORMAL HIGH (ref 3.0–12.0)
NEUTROS PCT: 73.4 % (ref 43.0–77.0)
Neutro Abs: 6.9 10*3/uL (ref 1.4–7.7)
Platelets: 458 10*3/uL — ABNORMAL HIGH (ref 150.0–400.0)
RBC: 2.81 Mil/uL — AB (ref 3.87–5.11)
RDW: 13.7 % (ref 11.5–15.5)
WBC: 9.3 10*3/uL (ref 4.0–10.5)

## 2015-04-04 LAB — CBC
HEMATOCRIT: 36.3 % (ref 35.0–47.0)
HEMOGLOBIN: 12.5 g/dL (ref 12.0–16.0)
MCH: 30 pg (ref 26.0–34.0)
MCHC: 34.4 g/dL (ref 32.0–36.0)
MCV: 87.1 fL (ref 80.0–100.0)
Platelets: 296 10*3/uL (ref 150–440)
RBC: 4.17 MIL/uL (ref 3.80–5.20)
RDW: 14.8 % — AB (ref 11.5–14.5)
WBC: 9.2 10*3/uL (ref 3.6–11.0)

## 2015-04-04 LAB — TROPONIN I: TROPONIN I: 0.07 ng/mL — AB (ref <0.031)

## 2015-04-04 MED ORDER — ONDANSETRON HCL 4 MG/2ML IJ SOLN
4.0000 mg | Freq: Once | INTRAMUSCULAR | Status: AC
  Administered 2015-04-04: 4 mg via INTRAVENOUS

## 2015-04-04 MED ORDER — ONDANSETRON HCL 4 MG/2ML IJ SOLN
INTRAMUSCULAR | Status: AC
  Administered 2015-04-04: 4 mg via INTRAVENOUS
  Filled 2015-04-04: qty 2

## 2015-04-04 MED ORDER — POTASSIUM CHLORIDE CRYS ER 20 MEQ PO TBCR
20.0000 meq | EXTENDED_RELEASE_TABLET | Freq: Once | ORAL | Status: AC
  Administered 2015-04-04: 20 meq via ORAL
  Filled 2015-04-04: qty 1

## 2015-04-04 MED ORDER — ONDANSETRON HCL 8 MG PO TABS: 8.0000 mg | ORAL_TABLET | Freq: Three times a day (TID) | ORAL | Status: DC | PRN

## 2015-04-04 NOTE — Patient Instructions (Addendum)
Stop Doxycycline and Levaquin.  Start Ondansetron to help with nausea.  Labs today.  If you have persistent nausea and vomiting and cannot keep fluids down, you will have to be seen for IV fluids.

## 2015-04-04 NOTE — Progress Notes (Signed)
Pre visit review using our clinic review tool, if applicable. No additional management support is needed unless otherwise documented below in the visit note. 

## 2015-04-04 NOTE — Assessment & Plan Note (Signed)
Symptoms improved. Exam normal today. Reviewed notes from recent hospitalization. Will set up follow up with pulmonary.

## 2015-04-04 NOTE — ED Notes (Signed)
Patient transported to X-ray via stretcher 

## 2015-04-04 NOTE — Assessment & Plan Note (Signed)
Two days of nausea with one episode of vomiting. Will hold last dose of antibiotics, which may be contributing. Start prn Ondansetron. Encouraged fluid intake. Discussed need for IVF and ER evaluation if she cannot take in fluids. Follow up recheck Friday.

## 2015-04-04 NOTE — ED Notes (Signed)
Patient presents to the ED for weakness and was sent to the ED by her doctor who checked her lab work and noted that her hemoglobin changed from a 12 to an 8 in a short amount of time.  Patient has a drain to her abdomen post abdominal surgery.  Patient had the flu about 1.5 weeks ago and was admitted to the hospital.  Patient reports feeling weaker than normal over the past week.  Patient reports some nausea and vomiting yesterday.  Denies vomiting today.

## 2015-04-04 NOTE — Assessment & Plan Note (Signed)
Completed treatment for Klebsiella infection seen in culture sent from thoracentesis. Will hold last dose of Doxycycline and Levaquin today given nausea. Follow up with Pulmonary in the next few weeks.

## 2015-04-04 NOTE — ED Notes (Signed)

## 2015-04-04 NOTE — ED Provider Notes (Signed)
Time Seen: Approximately 1850 I have reviewed the triage notes  Chief Complaint: Weakness and Abnormal Lab   History of Present Illness: Laurie Larsen is a 82 y.o. female who was referred here for laboratory evaluation. Patient apparently had outpatient blood work performed that showed a significant drop in her hemoglobin. Patient was recently admitted here to the hospital for influenza. She's been describing some generalized weakness with nausea and some persistent decreased appetite and occasional vomiting. Patient has been afebrile. She denies any syncopal episode. She states she's had some very rare right-sided chest discomfort, none present currently. She denies any left-sided chest pain Loc or flank discomfort. She denies any arm or jaw pain. She denies any loose stool. She has a history of chronic atrial fibrillation.  Past Medical History  Diagnosis Date  . Permanent atrial fibrillation (HCC)     a. Chronic Coumadin.  [name] HTN (hypertension)   . Chronic diastolic CHF (congestive heart failure), NYHA class 1 (HCC)     a. 04/2013 Echo: EF 50-55%, triv MR, mildly dil LA, PASP , mild TR, mild-mod RA dil.  [name] CAD (coronary artery disease)     a. 2005 Cath: 100% dLCX-->Med managed.  . Sick sinus syndrome (HCC)     a. 05/2008 s/p MDT EnRhythm DC PPM ([facility]).  . Chronic pain     a. ? due to arthritis  . Glaucoma   . Hyperlipidemia   . Diverticulitis large intestine   . Allergy   . Phlebitis     a. after pacemeker placement in 2010.  [name] Colon polyps   . Hypothyroidism   . Transfusion history     a. After childbirth 60 years ago (1957)  . Rash/skin eruption     a. Multiple episodes - wide distribution-intermittent, possibly drug rash.  . Pulmonary HTN (HCC)     a. PASP on echo 04/2013.  [name] Chronic Right Pleural Effusion     a. 07/2012 s/p thoracentesis - Followed by Dr. Delford Field [facility] Pulmonology.  . Arthritis   . Bronchitis   . Hay fever   . Colon polyps   .  History of blood transfusion   . Urine incontinence   . Back complaints   . Acalculous cholecystitis   . CHF (congestive heart failure) [facility])     Patient Active Problem List   Diagnosis Date Noted  . Nausea with vomiting 04/04/2015  . Malnutrition of moderate degree 03/26/2015  . Acute respiratory failure (HCC) 03/25/2015  . Pyrexia 03/21/2015  . Insomnia 02/20/2015  . Macular degeneration 02/20/2015  . Chronic pain syndrome 02/20/2015  . Screening for breast cancer 02/20/2015  . Cholecystostomy tube dysfunction   . Dyspnea   . Glaucoma 11/22/2014  . Cholecystitis 10/17/2014  . Protein-calorie malnutrition, severe (HCC) 06/01/2014  . CHF (congestive heart failure), NYHA class I (HCC) 05/31/2014  . Cardiac pacemaker in situ 03/10/2014  . HLD (hyperlipidemia) 03/10/2014  . Pulmonary HTN (HCC) 06/09/2013  . Nocturnal hypoxemia 04/20/2013  . Depression 06/23/2011  . Diastolic CHF, chronic (HCC) 11/13/2010  . Recurrent pleural effusion on right 10/01/2010  . Chronic anticoagulation 07/10/2010  . Hypothyroidism 04/24/2010  . Essential hypertension, benign 03/01/2010  . ATRIAL FIBRILLATION, CHRONIC 03/01/2010    Past Surgical History  Procedure Laterality Date  . Appendectomy  1939  . Abdominal hysterectomy  1977  . Tonsillectomy  82 yrs old  . Kidney surgery  1955    Right  . Ankle fracture surgery  1979  Left, pin  . Pacemaker insertion  06/20/08    MDT EnRhythm DR implanted by Dr Reyes Ivan  . Breast surgery      Needle guided excision, right breast calcification  . Breast fibroadenoma surgery  2010    Right  . Skin graft      left leg  . Breast biopsy  2011    Past Surgical History  Procedure Laterality Date  . Appendectomy  1939  . Abdominal hysterectomy  1977  . Tonsillectomy  82 yrs old  . Kidney surgery  1955    Right  . Ankle fracture surgery  1979    Left, pin  . Pacemaker insertion  06/20/08    MDT EnRhythm DR implanted by Dr Reyes Ivan  . Breast surgery       Needle guided excision, right breast calcification  . Breast fibroadenoma surgery  2010    Right  . Skin graft      left leg  . Breast biopsy  2011    Current Outpatient Rx  Name  Route  Sig  Dispense  Refill  . acetaminophen (TYLENOL) 650 MG CR tablet   Oral   Take 1,300 mg by mouth at bedtime as needed for pain.         [name] atenolol (TENORMIN) 25 MG tablet   Oral   Take 1 tablet (25 mg total) by mouth 2 (two) times daily.   180 tablet   3   . atorvastatin (LIPITOR) 40 MG tablet   Oral   Take 0.5 tablets (20 mg total) by mouth at bedtime.   45 tablet   3   . benzonatate (TESSALON) 200 MG capsule   Oral   Take 1 capsule (200 mg total) by mouth 3 (three) times daily as needed for cough.   20 capsule   0   . diltiazem (CARDIZEM CD) 180 MG 24 hr capsule      TAKE ONE CAPSULE DAILY   90 capsule   3   . docusate sodium (COLACE) 100 MG capsule   Oral   Take 1 capsule (100 mg total) by mouth 2 (two) times daily.   10 capsule   0   . dorzolamide-timolol (COSOPT) 22.3-6.8 MG/ML ophthalmic solution   Both Eyes   Place 1 drop into both eyes 2 (two) times daily.          . feeding supplement, ENSURE ENLIVE, (ENSURE ENLIVE) LIQD   Oral   Take 237 mLs by mouth 2 (two) times daily with a meal.   237 mL   12   . furosemide (LASIX) 40 MG tablet   Oral   Take 1 tablet (40 mg total) by mouth 2 (two) times daily.   60 tablet   0   . HYDROcodone-acetaminophen (NORCO) 10-325 MG tablet   Oral   Take 1 tablet by mouth 3 (three) times daily as needed for moderate pain.   20 tablet   0     We are taking over Rx from [facility], Dr. Ethelene Hal.  ...   . ipratropium-albuterol (DUONEB) 0.5-2.5 (3) MG/3ML SOLN   Nebulization   Take 3 mLs by nebulization every 6 (six) hours.   360 mL   0   . levothyroxine (SYNTHROID, LEVOTHROID) 112 MCG tablet   Oral   Take 1 tablet (112 mcg total) by mouth daily.   90 tablet   1   . mirtazapine (REMERON) 15 MG tablet   Oral    Take 1 tablet (15  mg total) by mouth at bedtime. Patient taking differently: Take 15 mg by mouth at bedtime as needed (for sleep.).    30 tablet   3   . mometasone-formoterol (DULERA) 100-5 MCG/ACT AERO   Inhalation   Inhale 2 puffs into the lungs 2 (two) times daily.   1 Inhaler   0   . OXYGEN   Inhalation   Inhale 2 L/min into the lungs at bedtime.         . polyethylene glycol (MIRALAX / GLYCOLAX) packet   Oral   Take 17 g by mouth at bedtime.          . potassium chloride (K-DUR) 10 MEQ tablet   Oral   Take 10 mEq by mouth 2 (two) times daily.         . traMADol (ULTRAM) 50 MG tablet   Oral   Take 1 tablet (50 mg total) by mouth 3 (three) times daily as needed.   90 tablet   3   . Travoprost, BAK Free, (TRAVATAN) 0.004 % SOLN ophthalmic solution   Both Eyes   Place 1 drop into both eyes at bedtime.         . Vitamin D, Ergocalciferol, (DRISDOL) 50000 UNITS CAPS capsule   Oral   Take 50,000 Units by mouth every 7 (seven) days. Pt takes on Saturday.         . warfarin (COUMADIN) 5 MG tablet   Oral   Take 1 tablet (5 mg total) by mouth daily at 6 PM. Take 1 tablet by mouth daily or as directed by coumadin clinic   90 tablet   1   . ondansetron (ZOFRAN) 8 MG tablet   Oral   Take 1 tablet (8 mg total) by mouth every 8 (eight) hours as needed for nausea or vomiting. Patient not taking: Reported on 04/04/2015   30 tablet   0     Allergies:  Amoxicillin; Cetylpyridinium; Digoxin; and Risedronate sodium  Family History: Family History  Problem Relation Age of Onset  . Cancer Mother     colon with mets  . Diabetes Mother   . Heart disease Father   . Hyperlipidemia Father   . Hypertension Father   . Heart attack Father   . Stroke Brother     brother who died 2022/07/05  . Cancer Paternal Aunt     breast  . Cancer Cousin     uterine, lung cancer  . Heart attack Brother     Social History: Social History  Substance Use Topics  . Smoking status:  Never Smoker   . Smokeless tobacco: Never Used  . Alcohol Use: No     Review of Systems:   10 point review of systems was performed and was otherwise negative:  Constitutional: No fever Eyes: No visual disturbances ENT: No sore throat, ear pain Cardiac: No chest pain Respiratory: No shortness of breath, wheezing, or stridor Abdomen: No abdominal pain, Nausea with decreased appetite. No diarrhea Endocrine: No weight loss, No night sweats Extremities: No peripheral edema, cyanosis Skin: No rashes, easy bruising Neurologic: No focal weakness, trouble with speech or swollowing Urologic: No dysuria, Hematuria, or urinary frequency   Physical Exam:  ED Triage Vitals  Enc Vitals Group     BP 04/04/15 1612 132/80 mmHg     Pulse Rate 04/04/15 1612 94     Resp 04/04/15 1612 16     Temp 04/04/15 1612 97.7 F (36.5 C)     Temp  Source 04/04/15 1612 Oral     SpO2 04/04/15 1612 94 %     Weight 04/04/15 1612 99 lb (44.906 kg)     Height 04/04/15 1612 5\' 3"  (1.6 m)     Head Cir --      Peak Flow --      Pain Score 04/04/15 1613 6     Pain Loc --      Pain Edu? --      Excl. in GC? --     General: Awake , Alert , and Oriented times 3; GCS 15 Head: Normal cephalic , atraumatic Eyes: Pupils equal , round, reactive to light Nose/Throat: No nasal drainage, patent upper airway without erythema or exudate.  Neck: Supple, Full range of motion, No anterior adenopathy or palpable thyroid masses Lungs: Clear to ascultation without wheezes , rhonchi, or rales Heart: Regular rate, regular rhythm without murmurs , gallops , or rubs Abdomen: Soft, non tender without rebound, guarding , or rigidity; bowel sounds positive and symmetric in all 4 quadrants. No organomegaly .        Extremities: 2 plus symmetric pulses. No edema, clubbing or cyanosis Neurologic: normal ambulation, Motor symmetric without deficits, sensory intact Skin: warm, dry, no rashes   Labs:   All laboratory work was  reviewed including any pertinent negatives or positives listed below:  Labs Reviewed  CBC - Abnormal; Notable for the following:    RDW 14.8 (*)    All other components within normal limits  URINALYSIS COMPLETEWITH MICROSCOPIC ([facility] ONLY) - Abnormal; Notable for the following:    Color, Urine YELLOW (*)    APPearance CLEAR (*)    Ketones, ur 1+ (*)    All other components within normal limits  PROTIME-INR - Abnormal; Notable for the following:    Prothrombin Time 29.4 (*)    All other components within normal limits  COMPREHENSIVE METABOLIC PANEL - Abnormal; Notable for the following:    Potassium 3.2 (*)    Chloride 100 (*)    Glucose, Bld 116 (*)    BUN 34 (*)    Creatinine, Ser 1.41 (*)    Calcium 8.8 (*)    GFR calc non Af Amer 32 (*)    GFR calc Af Amer 37 (*)    All other components within normal limits  TROPONIN I - Abnormal; Notable for the following:    Troponin I 0.07 (*)    All other components within normal limits  TROPONIN I  TYPE AND SCREEN  Review of the patient's laboratory work shows a normal hemoglobin below a low potassium. Also appears to be dehydrated with an elevated BUN to creatinine ratio. Initial troponin was 0.07 and the patient's had elevated troponins in the past and repeat was 0.03. EKG: ED ECG REPORT I, Jennye Moccasin, the attending physician, personally viewed and interpreted this ECG.  Date: 04/04/2015 EKG Time: Atrial fibrillation Rate: 97 Rhythm: normal sinus rhythm QRS Axis: normal Intervals: normal ST/T Wave abnormalities: normal Conduction Disturbances: none Narrative Interpretation: unremarkable No significant change from prior EKG Poor R-wave progression with no acute ischemic changes   Radiology:   CLINICAL DATA: Right-sided chest pain. Recent ultrasound-guided thoracentesis of right pleural effusion.  EXAM: CHEST 2 VIEW  COMPARISON: Most recent radiographs 03/28/2015, chest CT 03/27/2015  FINDINGS: Dual lead  left-sided pacemaker remains in place. Cardiomegaly is stable. No pneumothorax post recent thoracentesis. Persistent right basilar opacity, likely combination of pleural fluid and atelectasis. Improving left lung base aeration. Linear peripheral  opacity in the left lower lung zone, unchanged, suggesting atelectasis. Upper lungs are clear. No pulmonary edema.  IMPRESSION: Right basilar opacity likely combination of pleural fluid and atelectasis. No new abnormalities are seen.     I personally reviewed the radiologic studies   ED Course: Patient's stay here was uneventful and she was given IV fluid bolus along with Zofran 4 mg IV and felt symptomatically improved. Patient was also given an oral tablet of potassium. I felt these may be residual effects from her influenza A and hospital stay. She is not appear to have community-acquired pneumonia or urinary tract infection or any signs of another secondary infection at this time. The patient was able to eat a sandwich and wishes to go home. She has an outpatient prescription for Zofran.   Assessment: * Nausea and vomiting Mild dehydration     Plan: Outpatient management Patient was advised to return immediately if condition worsens. Patient was advised to follow up with their primary care physician or other specialized physicians involved in their outpatient care            Jennye MoccasinBrian S Quigley, MD 04/04/15 2219

## 2015-04-04 NOTE — Progress Notes (Signed)
Subjective:    Patient ID: Laurie Larsen, female    DOB: 06-08-1923, 82 y.o.   MRN: [id number]  HPI  82YO female presents for hospital follow up.  ADMITTED: 03/25/2015 DISCHARGED: 03/29/2015  DIAGNOSIS: Hypoxia, Acute on chronic CHF  Admitted through ER. Found to have Influenza A. Treated with Tamiflu. Found to have right-sided pleural effusion and culture was pos for Klebsiella. Treated with Doxycycline and Levaquin. INR was noted to be supertherapeutic, and coumadin was held until discharge.  Feeling nauseous last 2 days. No po intake last night or this morning. Yesterday ate soup and sandwich for lunch and some ginger ale. Vomited yesterday evening, but was able to hold down coffee. No liquid intake today.  No fever.  Wt Readings from Last 3 Encounters:  04/04/15 99 lb (44.906 kg)  03/29/15 103 lb (46.72 kg)  03/21/15 101 lb (45.813 kg)   BP Readings from Last 3 Encounters:  04/04/15 120/82  03/29/15 134/90  03/21/15 107/81    Past Medical History  Diagnosis Date  . Permanent atrial fibrillation (HCC)     a. Chronic Coumadin.  [name] HTN (hypertension)   . Chronic diastolic CHF (congestive heart failure), NYHA class 1 (HCC)     a. 04/2013 Echo: EF 50-55%, triv MR, mildly dil LA, PASP , mild TR, mild-mod RA dil.  [name] CAD (coronary artery disease)     a. 2005 Cath: 100% dLCX-->Med managed.  . Sick sinus syndrome (HCC)     a. 05/2008 s/p MDT EnRhythm DC PPM ([facility]).  . Chronic pain     a. ? due to arthritis  . Glaucoma   . Hyperlipidemia   . Diverticulitis large intestine   . Allergy   . Phlebitis     a. after pacemeker placement in 2010.  [name] Colon polyps   . Hypothyroidism   . Transfusion history     a. After childbirth 60 years ago (1957)  . Rash/skin eruption     a. Multiple episodes - wide distribution-intermittent, possibly drug rash.  . Pulmonary HTN (HCC)     a. PASP on echo 04/2013.  [name] Chronic Right Pleural Effusion     a. 07/2012 s/p  thoracentesis - Followed by Dr. Delford Field [facility] Pulmonology.  . Arthritis   . Bronchitis   . Hay fever   . Colon polyps   . History of blood transfusion   . Urine incontinence   . Back complaints   . Acalculous cholecystitis   . CHF (congestive heart failure) (HCC)    Family History  Problem Relation Age of Onset  . Cancer Mother     colon with mets  . Diabetes Mother   . Heart disease Father   . Hyperlipidemia Father   . Hypertension Father   . Heart attack Father   . Stroke Brother     brother who died 2022/06/15  . Cancer Paternal Aunt     breast  . Cancer Cousin     uterine, lung cancer  . Heart attack Brother    Past Surgical History  Procedure Laterality Date  . Appendectomy  1939  . Abdominal hysterectomy  1977  . Tonsillectomy  82 yrs old  . Kidney surgery  1955    Right  . Ankle fracture surgery  1979    Left, pin  . Pacemaker insertion  06/20/08    MDT EnRhythm DR implanted by Dr Reyes Ivan  . Breast surgery      Needle guided excision, right breast  calcification  . Breast fibroadenoma surgery  20-Jun-2008    Right  . Skin graft      left leg  . Breast biopsy  Jun 20, 2009   Social History   Social History  . Marital Status: Married    Spouse Name: Lorella Nimrod  . Number of Children: 3  . Years of Education: 13   Occupational History  . RETIRED    Social History Main Topics  . Smoking status: Never Smoker   . Smokeless tobacco: Never Used  . Alcohol Use: No  . Drug Use: No  . Sexual Activity: No   Other Topics Concern  . None   Social History Narrative   Lives with husband in [place]. Three sons.      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     Married 1948.    3 sons - 06/21/2050, 06-21-55, 06-20-56; 5 grandchildren-one deceased -  OD @ 32.        End of Life Care: no prolonged heroic measures, i.e. Artificial feeding or hydration; DNR; no prolonged intubation.    Review of Systems    Constitutional: Positive for fatigue. Negative for fever, chills, appetite change and unexpected weight change.  Eyes: Negative for visual disturbance.  Respiratory: Positive for shortness of breath (chronic with exertion). Negative for cough, chest tightness and wheezing.   Cardiovascular: Negative for chest pain and leg swelling.  Gastrointestinal: Positive for nausea and vomiting. Negative for abdominal pain, diarrhea and constipation.  Skin: Negative for color change and rash.  Neurological: Positive for weakness.  Hematological: Negative for adenopathy. Does not bruise/bleed easily.  Psychiatric/Behavioral: Negative for sleep disturbance and dysphoric mood. The patient is not nervous/anxious.        Objective:    BP 120/82 mmHg  Pulse 86  Temp(Src) 97.5 F (36.4 C) (Oral)  Ht  (1.6 m)  Wt 99 lb (44.906 kg)  BMI 17.54 kg/m2  SpO2 94% Physical Exam  Constitutional: She is oriented to person, place, and time. She appears well-developed and well-nourished. No distress.  HENT:  Head: Normocephalic and atraumatic.  Right Ear: External ear normal.  Left Ear: External ear normal.  Nose: Nose normal.  Mouth/Throat: Oropharynx is clear and moist. No oropharyngeal exudate.  Eyes: Conjunctivae are normal. Pupils are equal, round, and reactive to light. Right eye exhibits no discharge. Left eye exhibits no discharge. No scleral icterus.  Neck: Normal range of motion. Neck supple. No tracheal deviation present. No thyromegaly present.  Cardiovascular: Normal rate, regular rhythm, normal heart sounds and intact distal pulses.  Exam reveals no gallop and no friction rub.   No murmur heard. Pulmonary/Chest: Effort normal and breath sounds normal. No accessory muscle usage. No tachypnea. No respiratory distress. She has no decreased breath sounds. She has no wheezes. She has no rhonchi. She has no rales. She exhibits no tenderness.  Musculoskeletal: Normal range of motion. She exhibits  no edema or tenderness.  Lymphadenopathy:    She has no cervical adenopathy.  Neurological: She is alert and oriented to person, place, and time. No cranial nerve deficit. She exhibits normal muscle tone. Coordination normal.  Skin: Skin is warm and dry. No rash noted. She is not diaphoretic. No erythema. No pallor.  Psychiatric: She has a normal mood and affect. Her behavior is normal. Judgment and thought content normal.          Assessment & Plan:  Problem List Items Addressed This Visit      Unprioritized   Acute respiratory failure (HCC) - Primary    Symptoms improved. Exam normal today. Reviewed notes from recent hospitalization. Will set up follow up with pulmonary.      Nausea with vomiting    Two days of nausea with one episode of vomiting. Will hold last dose of antibiotics, which may be contributing. Start prn Ondansetron. Encouraged fluid intake. Discussed need for IVF and ER evaluation if she cannot take in fluids. Follow up recheck Friday.      Relevant Orders   CBC w/Diff   Comprehensive metabolic panel   Recurrent pleural effusion on right    Completed treatment for Klebsiella infection seen in culture sent from thoracentesis. Will hold last dose of Doxycycline and Levaquin today given nausea. Follow up with Pulmonary in the next few weeks.          Return in about 2 days (around 04/06/2015) for Recheck.  Ronna PolioJennifer Bradi Arbuthnot, MD Internal Medicine [facility]

## 2015-04-05 NOTE — ED Notes (Signed)
Assisted pt up to bathroom, pt began to feel nauseous upon return to stretcher. Assisted pt back into bed. MD notified, see MAR for med order.

## 2015-04-06 ENCOUNTER — Encounter: Payer: Self-pay | Admitting: Internal Medicine

## 2015-04-06 ENCOUNTER — Telehealth: Payer: Self-pay | Admitting: *Deleted

## 2015-04-06 ENCOUNTER — Ambulatory Visit (INDEPENDENT_AMBULATORY_CARE_PROVIDER_SITE_OTHER): Payer: Medicare Other | Admitting: Internal Medicine

## 2015-04-06 VITALS — BP 126/82 | HR 88 | Temp 97.8°F | Ht 63.0 in | Wt 100.2 lb

## 2015-04-06 DIAGNOSIS — Z66 Do not resuscitate: Secondary | ICD-10-CM

## 2015-04-06 DIAGNOSIS — E440 Moderate protein-calorie malnutrition: Secondary | ICD-10-CM

## 2015-04-06 DIAGNOSIS — R112 Nausea with vomiting, unspecified: Secondary | ICD-10-CM

## 2015-04-06 NOTE — Telephone Encounter (Signed)
-----   Message from Shane CrutchPradeep Ramachandran, MD sent at 04/04/2015  3:59 PM EST ----- Pls arrange hfu in next few weeks.   ----- Message -----    From: Shelia MediaJennifer A Walker, MD    Sent: 04/04/2015  11:42 AM      To: Shane CrutchPradeep Ramachandran, MD  FYI - Seen in hospital follow up. Doing well from a pulmonary standpoint, however, now having NV. Will monitor closely. She will also need follow up with you in next few weeks. Thanks! Candise BowensJen

## 2015-04-06 NOTE — Assessment & Plan Note (Signed)
Encouraged continued dietary supplementation with Ensure. Follow up in 2-4 weeks.

## 2015-04-06 NOTE — Assessment & Plan Note (Addendum)
Recent nausea after hospitalization for flu and pulmonary infection. Symptoms improved somewhat after stopping antibiotics. Will continue prn Ondansetron. Follow up in 2-4 weeks and prn.

## 2015-04-06 NOTE — Assessment & Plan Note (Signed)
Discussion today about end of life wishes. She does not wish to have CPR in setting of cardiac arrest. She does not wish to have intubation in setting of respiratory arrest. DNR form given for her to display at home.

## 2015-04-06 NOTE — Progress Notes (Signed)
Subjective:    Patient ID: Laurie Larsen, female    DOB: Jul 14, 1923, 82 y.o.   MRN: [id number]  HPI  82YO female presents for follow up.  Seen earlier this week in hospital follow up. Sent back to ER after finding of Hgb 8 in setting of nausea, weakness. Repeat Hgb in ER was 12. Pt was able to tolerate food by mouth and nausea had improved.  Nausea improved some on Ondansetron. Took medication twice yesterday. No emesis. Felt weak yesterday. No fever, chills. Walking to the cafe at her ALF.  Husband in the hospital.  She would like to to have DNR form for her home. She does not want CPR in case of cardiac arrest.  Wt Readings from Last 3 Encounters:  04/06/15 100 lb 4 oz (45.473 kg)  04/04/15 99 lb (44.906 kg)  04/04/15 99 lb (44.906 kg)   BP Readings from Last 3 Encounters:  04/06/15 126/82  04/04/15 161/93  04/04/15 120/82    Past Medical History  Diagnosis Date  . Permanent atrial fibrillation (HCC)     a. Chronic Coumadin.  [name] HTN (hypertension)   . Chronic diastolic CHF (congestive heart failure), NYHA class 1 (HCC)     a. 04/2013 Echo: EF 50-55%, triv MR, mildly dil LA, PASP , mild TR, mild-mod RA dil.  [name] CAD (coronary artery disease)     a. 2005 Cath: 100% dLCX-->Med managed.  . Sick sinus syndrome (HCC)     a. 05/2008 s/p MDT EnRhythm DC PPM (Allred).  . Chronic pain     a. ? due to arthritis  . Glaucoma   . Hyperlipidemia   . Diverticulitis large intestine   . Allergy   . Phlebitis     a. after pacemeker placement in 2010.  [name] Colon polyps   . Hypothyroidism   . Transfusion history     a. After childbirth 60 years ago (1957)  . Rash/skin eruption     a. Multiple episodes - wide distribution-intermittent, possibly drug rash.  . Pulmonary HTN (HCC)     a. PASP on echo 04/2013.  [name] Chronic Right Pleural Effusion     a. 07/2012 s/p thoracentesis - [facility]ed [facility].  . Arthritis   . Bronchitis   . Hay fever   .  Colon polyps   . History of blood transfusion   . Urine incontinence   . Back complaints   . Acalculous cholecystitis   . CHF (congestive heart failure) (HCC)    Family History  Problem Relation Age of Onset  . Cancer Mother     colon with mets  . Diabetes Mother   . Heart disease Father   . Hyperlipidemia Father   . Hypertension Father   . Heart attack Father   . Stroke Brother     brother who died 03-Jul-2022  . Cancer Paternal Aunt     breast  . Cancer Cousin     uterine, lung cancer  . Heart attack Brother    Past Surgical History  Procedure Laterality Date  . Appendectomy  1939  . Abdominal hysterectomy  1977  . Tonsillectomy  82 yrs old  . Kidney surgery  1955    Right  . Ankle fracture surgery  1979    Left, pin  . Pacemaker insertion  06/20/08    MDT EnRhythm DR implanted by Dr Reyes Ivan  . Breast surgery      Needle guided excision, right breast calcification  .  Breast fibroadenoma surgery  2010    Right  . Skin graft      left leg  . Breast biopsy  2011   Social History   Social History  . Marital Status: Married    Spouse Name: Lorella NimrodHarvey  . Number of Children: 3  . Years of Education: 13   Occupational History  . RETIRED    Social History Main Topics  . Smoking status: Never Smoker   . Smokeless tobacco: Never Used  . Alcohol Use: No  . Drug Use: No  . Sexual Activity: No   Other Topics Concern  . None   Social History Narrative   [place]. Three sons.      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   Work: [employer] and then [employer] de[employer] - retired.     Married 1948.    3 sons - '52, '57, '58; 5 grandchildren-one deceased -  OD @ 4421.        End of Life Care: no prolonged heroic measures, i.e. Artificial feeding or hydration; DNR; no prolonged intubation.    Review of Systems  Constitutional: Positive for fatigue. Negative for fever, chills, appetite change and unexpected weight change.  Eyes:  Negative for visual disturbance.  Respiratory: Negative for shortness of breath.   Cardiovascular: Negative for chest pain and leg swelling.  Gastrointestinal: Positive for nausea and vomiting. Negative for abdominal pain, diarrhea and constipation.  Skin: Negative for color change and rash.  Neurological: Positive for weakness.  Hematological: Negative for adenopathy. Does not bruise/bleed easily.  Psychiatric/Behavioral: Negative for sleep disturbance and dysphoric mood. The patient is not nervous/anxious.        Objective:    BP 126/82 mmHg  Pulse 88  Temp(Src) 97.8 F (36.6 C) (Oral)  Ht 5\' 3"  (1.6 m)  Wt 100 lb 4 oz (45.473 kg)  BMI 17.76 kg/m2  SpO2 95% Physical Exam  Constitutional: She is oriented to person, place, and time. She appears well-developed and well-nourished. No distress.  HENT:  Head: Normocephalic and atraumatic.  Right Ear: External ear normal.  Left Ear: External ear normal.  Nose: Nose normal.  Mouth/Throat: Oropharynx is clear and moist. No oropharyngeal exudate.  Eyes: Conjunctivae are normal. Pupils are equal, round, and reactive to light. Right eye exhibits no discharge. Left eye exhibits no discharge. No scleral icterus.  Neck: Normal range of motion. Neck supple. No tracheal deviation present. No thyromegaly present.  Cardiovascular: Normal rate, regular rhythm, normal heart sounds and intact distal pulses.  Exam reveals no gallop and no friction rub.   No murmur heard. Pulmonary/Chest: Effort normal and breath sounds normal. No respiratory distress. She has no wheezes. She has no rales. She exhibits no tenderness.  Musculoskeletal: Normal range of motion. She exhibits no edema or tenderness.  Lymphadenopathy:    She has no cervical adenopathy.  Neurological: She is alert and oriented to person, place, and time. No cranial nerve deficit. She exhibits normal muscle tone. Coordination normal.  Skin: Skin is warm and dry. No rash noted. She is not  diaphoretic. No erythema. No pallor.  Psychiatric: She has a normal mood and affect. Her behavior is normal. Judgment and thought content normal.          Assessment & Plan:   Problem List Items Addressed This Visit      Unprioritized   DNR (do not resuscitate)    Discussion today about end of life wishes.  She does not wish to have CPR in setting of cardiac arrest. She does not wish to have intubation in setting of respiratory arrest. DNR form given for her to display at home.      Malnutrition of moderate degree    Encouraged continued dietary supplementation with Ensure. Follow up in 2-4 weeks.      Nausea with vomiting - Primary    Recent nausea after hospitalization for flu and pulmonary infection. Symptoms improved somewhat after stopping antibiotics. Will continue prn Ondansetron. Follow up in 2-4 weeks and prn.      Relevant Orders   Ambulatory referral to Home Health       Return in about 4 weeks (around 05/04/2015) for Recheck.  Ronna Polio, MD Internal Medicine [facility]

## 2015-04-06 NOTE — Progress Notes (Signed)
Pre visit review using our clinic review tool, if applicable. No additional management support is needed unless otherwise documented below in the visit note. 

## 2015-04-06 NOTE — Patient Instructions (Signed)
Continue current medication.  Use Ondansetron as needed for nausea.  Follow up in 2 weeks and sooner as needed.

## 2015-04-06 NOTE — Telephone Encounter (Signed)
LMOM for pt to call back to schedule hosp f/u appt. Tried calling home number but it wouldn't ring, it just hung up.

## 2015-04-10 NOTE — Telephone Encounter (Signed)
hosp f/u scheduled. Nothing further needed.

## 2015-04-12 ENCOUNTER — Ambulatory Visit: Payer: Medicare Other | Admitting: Internal Medicine

## 2015-04-13 ENCOUNTER — Telehealth: Payer: Self-pay

## 2015-04-13 ENCOUNTER — Other Ambulatory Visit: Payer: Self-pay | Admitting: *Deleted

## 2015-04-13 NOTE — Patient Outreach (Signed)
Attempt made to contact pt, f/u on MD referral.  Gentleman answering the phone reports pt is not in now.  RN CM to call back again 3/20.    Shayne Alkenose M.   Pierzchala RN CCM [facility] Care [facility]  [phone]

## 2015-04-13 NOTE — Telephone Encounter (Signed)
Yes, fine to D/C Home Health

## 2015-04-13 NOTE — Telephone Encounter (Signed)
Verbal order given to d/c home health aide

## 2015-04-13 NOTE — Telephone Encounter (Signed)
[facility] is asking for a verbal order to d/c home health care, [name] states that pt does not feel she need it anymore. Okay to give verbal? Please advise,thanks

## 2015-04-16 ENCOUNTER — Other Ambulatory Visit: Payer: Self-pay | Admitting: *Deleted

## 2015-04-16 ENCOUNTER — Ambulatory Visit: Payer: Self-pay | Admitting: *Deleted

## 2015-04-16 NOTE — Patient Outreach (Signed)
Second attempt made to contact pt, f/u on MD referral.  HIPPA compliant voice message left with contact number. If no response, will try again.     Shayne Alkenose M.   Pierzchala RN CCM [facility]  [phone]

## 2015-04-17 ENCOUNTER — Encounter: Payer: Self-pay | Admitting: Internal Medicine

## 2015-04-17 ENCOUNTER — Ambulatory Visit (INDEPENDENT_AMBULATORY_CARE_PROVIDER_SITE_OTHER): Payer: Medicare Other | Admitting: Internal Medicine

## 2015-04-17 VITALS — BP 155/80 | HR 75 | Ht 63.0 in | Wt 105.8 lb

## 2015-04-17 DIAGNOSIS — I482 Chronic atrial fibrillation, unspecified: Secondary | ICD-10-CM

## 2015-04-17 NOTE — Patient Instructions (Signed)
Medication Instructions: - Your physician recommends that you continue on your current medications as directed. Please refer to the Current Medication list given to you today.  Labwork: - none  Procedures/Testing: - none  Follow-Up: - Dr. Klein will see you back on an as needed basis.  Any Additional Special Instructions Will Be Listed Below (If Applicable).     If you need a refill on your cardiac medications before your next appointment, please call your pharmacy.   

## 2015-04-17 NOTE — Progress Notes (Signed)
Patient Care Team: Shelia MediaJennifer A Walker, MD as PCP - General (Internal Medicine) Peter M SwazilandJordan, MD (Cardiology) Hillis RangeJames Allred, MD (Cardiology) Sharrell KuJeffrey Medoff, MD (Gastroenterology) Melvenia NeedlesLeon Cashwell, MD (Ophthalmology) Cherlyn RobertsFrederick Lupton, MD (Dermatology) Sheran Luzichard Ramos, MD (Physical Medicine and Rehabilitation) Catha GosselinKevin Little, MD as Attending Physician (Family Medicine) Della Gooose M Pierzchala, RN as [facility] Care Management   HPI  Laurie Larsen is a 82 y.o. female Seen with a previously implanted pacemaker in the context of permanent atrial fibrillation. Her device has reached ERI and last visit we reprogrammed device to see if pacing were still indicated now that AF permanent had evolved  She was hospitalized 5/16 [facility].  These records were reviewed. Echocardiogram 5/16 demonstrated normal ejection fraction she was diuresed and rate control was continued.  She continues with complaints of shortness of breath. She has some problems with peripheral edema and she adjusts her diuretics on an as-needed basis going from 60--80.   She has intercurrently been hospitalized for pneumonia/flu.  She is back at home and feels like she is making good progress.           Past Medical History  Diagnosis Date  . Permanent atrial fibrillation (HCC)     a. Chronic Coumadin.  [name]. HTN (hypertension)   . Chronic diastolic CHF (congestive heart failure), NYHA class 1 (HCC)     a. 04/2013 Echo: EF 50-55%, triv MR, mildly dil LA, PASP 48mmHg, mild TR, mild-mod RA dil.  [name]. CAD (coronary artery disease)     a. 2005 Cath: 100% dLCX-->Med managed.  . Sick sinus syndrome (HCC)     a. 05/2008 s/p MDT EnRhythm DC PPM (Allred).  . Chronic pain     a. ? due to arthritis  . Glaucoma   . Hyperlipidemia   . Diverticulitis large intestine   . Allergy   . Phlebitis     a. after pacemeker placement in 2010.  [name]. Colon polyps   . Hypothyroidism   . Transfusion history     a. After childbirth 60  years ago (1957)  . Rash/skin eruption     a. Multiple episodes - wide distribution-intermittent, possibly drug rash.  . Pulmonary HTN (HCC)     a. PASP 48mmHg on echo 04/2013.  [name]. Chronic Right Pleural Effusion     a. 07/2012 s/p thoracentesis - [facility].  . Arthritis   . Bronchitis   . Hay fever   . Colon polyps   . History of blood transfusion   . Urine incontinence   . Back complaints   . Acalculous cholecystitis   . CHF (congestive heart failure) [facility](HCC)     Past Surgical History  Procedure Laterality Date  . Appendectomy  1939  . Abdominal hysterectomy  1977  . Tonsillectomy  82 yrs old  . Kidney surgery  1955    Right  . Ankle fracture surgery  1979    Left, pin  . Pacemaker insertion  06/20/08    MDT EnRhythm DR implanted by Dr Reyes IvanKersey  . Breast surgery      Needle guided excision, right breast calcification  . Breast fibroadenoma surgery  2010    Right  . Skin graft      left leg  . Breast biopsy  2011    Current Outpatient Prescriptions  Medication Sig Dispense Refill  . acetaminophen (TYLENOL) 650 MG CR tablet Take 1,300 mg by mouth at bedtime as needed for pain.    .[name]  atenolol (TENORMIN) 25 MG tablet Take 1 tablet (25 mg total) by mouth 2 (two) times daily. 180 tablet 3  . atorvastatin (LIPITOR) 40 MG tablet Take 0.5 tablets (20 mg total) by mouth at bedtime. 45 tablet 3  . benzonatate (TESSALON) 200 MG capsule Take 1 capsule (200 mg total) by mouth 3 (three) times daily as needed for cough. 20 capsule 0  . diltiazem (CARDIZEM CD) 180 MG 24 hr capsule TAKE ONE CAPSULE DAILY 90 capsule 3  . docusate sodium (COLACE) 100 MG capsule Take 1 capsule (100 mg total) by mouth 2 (two) times daily. 10 capsule 0  . dorzolamide-timolol (COSOPT) 22.3-6.8 MG/ML ophthalmic solution Place 1 drop into both eyes 2 (two) times daily.     . feeding supplement, ENSURE ENLIVE, (ENSURE ENLIVE) LIQD Take 237 mLs by mouth 2 (two) times daily with a meal.  237 mL 12  . furosemide (LASIX) 40 MG tablet Take 1 tablet (40 mg total) by mouth 2 (two) times daily. 60 tablet 0  . HYDROcodone-acetaminophen (NORCO) 10-325 MG tablet Take 1 tablet by mouth 3 (three) times daily as needed for moderate pain. 20 tablet 0  . ipratropium-albuterol (DUONEB) 0.5-2.5 (3) MG/3ML SOLN Take 3 mLs by nebulization every 6 (six) hours. 360 mL 0  . levothyroxine (SYNTHROID, LEVOTHROID) 112 MCG tablet Take 1 tablet (112 mcg total) by mouth daily. 90 tablet 1  . mirtazapine (REMERON) 15 MG tablet Take 1 tablet (15 mg total) by mouth at bedtime. (Patient taking differently: Take 15 mg by mouth at bedtime as needed (for sleep.). ) 30 tablet 3  . mometasone-formoterol (DULERA) 100-5 MCG/ACT AERO Inhale 2 puffs into the lungs 2 (two) times daily. 1 Inhaler 0  . ondansetron (ZOFRAN) 8 MG tablet Take 1 tablet (8 mg total) by mouth every 8 (eight) hours as needed for nausea or vomiting. 30 tablet 0  . OXYGEN Inhale 2 L/min into the lungs at bedtime.    . polyethylene glycol (MIRALAX / GLYCOLAX) packet Take 17 g by mouth at bedtime.     . potassium chloride (K-DUR) 10 MEQ tablet Take 10 mEq by mouth 2 (two) times daily.    . traMADol (ULTRAM) 50 MG tablet Take 1 tablet (50 mg total) by mouth 3 (three) times daily as needed. 90 tablet 3  . Travoprost, BAK Free, (TRAVATAN) 0.004 % SOLN ophthalmic solution Place 1 drop into both eyes at bedtime.    . Vitamin D, Ergocalciferol, (DRISDOL) 50000 UNITS CAPS capsule Take 50,000 Units by mouth every 7 (seven) days. Pt takes on Saturday.    . warfarin (COUMADIN) 5 MG tablet Take 1 tablet (5 mg total) by mouth daily at 6 PM. Take 1 tablet by mouth daily or as directed by coumadin clinic 90 tablet 1   No current facility-administered medications for this visit.    Allergies  Allergen Reactions  . Amoxicillin Other (See Comments)    Reaction:  Weakness  . Cetylpyridinium Other (See Comments)    weakness  . Digoxin Other (See Comments)     Reaction:  Weakness and dehydration  . Risedronate Sodium Rash    Review of Systems negative except from HPI and PMH  Physical Exam BP 155/80 mmHg  Pulse 75  Ht  (1.6 m)  Wt 105 lb 12 oz (47.968 kg)  BMI 18.74 kg/m2 Well developed and well nourished in no acute distress HENT normal E scleral and icterus clear Neck Supple JVP flat; carotids brisk and full Clear to ausculation Device  pocket well healed; without hematoma or erythema.  There is no tethering  Irregularly irregular rate and rhythm, no murmurs gallops or rub Soft with active bowel sounds No clubbing cyanosis Trace Edema Alert and oriented, grossly normal motor and sensory function Skin Warm and Dry  ECG 4/16 demonstrated atrial fibrillation with a reasonably controlled ventricular response of 81  Assessment and  Plan  Atrial fibrillation-permanent  Bradycardia  Pacemaker-Medtronic-single chamber The patient's device was interrogated.  The information was reviewed. No changes were made in the programming.     HFpEF  Hypertension    Her pacing was 3.9% at 45 bpm. While observing her heart rates are 60-110. I think the benefit of replacing her pacemaker would be trivial as almost all of her pacing is likely nocturnal. The risks are not inconsequential. She is in agreement. She had an episode of abrupt dyspnea. During this episode her heart rate was stable in the 70s-90s. Her lungs were clear. She does admit to brackish taste and I wonder whether this is reflux. She is to see pulmonary and weak.  We spent more than 50% of our >25 min visit in face to face counseling regarding the above

## 2015-04-19 ENCOUNTER — Telehealth: Payer: Self-pay

## 2015-04-19 NOTE — Telephone Encounter (Signed)
Called nurse back not able to leave a message.

## 2015-04-19 NOTE — Telephone Encounter (Signed)
FYI: Laurie SlocumbLisa Mitchell states that pt's Ortho stats have been running around 91-92. Pt needs an order for portable oxygen. Pt does not have any shortness of breath, her color is good, activity level. Please advise, thanks

## 2015-04-19 NOTE — Telephone Encounter (Signed)
She is followed by pulmonary and cardiology. I think that pulmonary has written for her oxygen at home. They just need to add an order for portable oxygen.

## 2015-04-20 ENCOUNTER — Ambulatory Visit: Payer: Self-pay | Admitting: *Deleted

## 2015-04-20 ENCOUNTER — Other Ambulatory Visit: Payer: Self-pay | Admitting: *Deleted

## 2015-04-20 NOTE — Patient Outreach (Signed)
Another attempt (third) made to contact pt, f/u on MD referral.  HIPPA compliant voice message left with nurse's name and contact number.  If no response, will try again.     Shayne Alkenose M.   Brieanna Nau RN CCM [facility]  [phone]

## 2015-04-23 ENCOUNTER — Encounter: Payer: Self-pay | Admitting: *Deleted

## 2015-04-23 ENCOUNTER — Ambulatory Visit (INDEPENDENT_AMBULATORY_CARE_PROVIDER_SITE_OTHER): Payer: Medicare Other | Admitting: Internal Medicine

## 2015-04-23 ENCOUNTER — Other Ambulatory Visit: Payer: Self-pay | Admitting: *Deleted

## 2015-04-23 ENCOUNTER — Encounter: Payer: Self-pay | Admitting: Internal Medicine

## 2015-04-23 VITALS — BP 142/78 | HR 75 | Ht 63.0 in | Wt 102.8 lb

## 2015-04-23 DIAGNOSIS — I272 Other secondary pulmonary hypertension: Secondary | ICD-10-CM

## 2015-04-23 DIAGNOSIS — R0602 Shortness of breath: Secondary | ICD-10-CM

## 2015-04-23 DIAGNOSIS — G4734 Idiopathic sleep related nonobstructive alveolar hypoventilation: Secondary | ICD-10-CM

## 2015-04-23 NOTE — Patient Outreach (Signed)
F/u on MD referral ( earlier phone attempts-3, no response).  Spoke with pt, HIPPA verified.  Discussed with pt MD referral [facility], services reviewed to which pt gave verbal consent.  Pt reports her  Biggest problem is trouble with swollen ankles, keeping O2 up to 90%.  Pt reports on visit today with Dr. Nicholos Johnsamachandran, to start use of O2 2L [place] during the day, try Muccinex to help with cough/get secretions up which she plans to get.  Pt reports she takes all of her medications, as discussed with MD today - to try doing nebulizer treatments twice a day as now cannot tell difference doing mostly once a day. Pt reports unable to do inhalers because of her hands (arthritis).   Pt reports HH RN and PT are coming 1-2 times a week, to end soon.  Pt reports weighs some, does not record.  RN CM discussed importance of weighing daily,record, call MD for weight gain of 3 lbs. In a day, 5 lbs in a week.  As discussed with pt, home visit scheduled for 4/4.   Plan to send  Dr. Dan HumphreysWalker barrier letter by in basket in Epic- inform [facility] involvement   Shayne Alkenose M.   Pierzchala RN CCM [facility] Care [facility]  [phone]

## 2015-04-23 NOTE — Patient Instructions (Addendum)
Will start portable oxygen to be used when walking around.  You have chronic bronchitis which leads to lung secretions, you can try mucinex to help with these secretions.

## 2015-04-23 NOTE — Addendum Note (Signed)
Addended by: Maxwell MarionBLANKENSHIP, MARGIE A on: 04/23/2015 11:50 AM   Modules accepted: Orders

## 2015-04-23 NOTE — Progress Notes (Signed)
[facility]     Assessment and Plan:  Dyspnea.  --Multifactorial due to debility/deconditioning, age-related emphysematous changes, and CHF.  --Appears to be doing well at this time.  --Continue oxygen 2L qhs.   Pleural effusion due to congestive heart failure.  --Stable.   Emphysema.  -Seen on chest x-ray, suspect these are age-related changes. --Had some desats on RA, will start oxygen at 2L.   Chronic Bronchitis --Has chronic expectoration, suggested she try mucinex.   Date: 04/23/2015  MRN# [id number] Laurie Larsen Oct 19, 1923   Laurie Larsen is a [age] y.o. old female seen in follow up for chief complaint of  Chief Complaint  Patient presents with  . Hospitalization Follow-up    pt. c/o SOB, occ. dry cough,chest tightness, weakness. denies wheezing or chest pain. on 3L 02 @ bedtime.     HPI:   Patient is a 82 year old female with a history of congestive heart failure, atrial fibrillation, sick sinus syndrome status post pacemaker. She also has a history of a right-sided pleural effusion which was drained in July 2014. She was asked to follow-up in regards to her respiratory issues. When she was last seen in the hospital. He was noted that she had some age-related emphysematous changes seen on chest x-ray, and a stable pleural effusion.  Recently, she notes that her breathing has been tough, she checks her oxygen and finds that it drops below 90%. She has been using nebulizers once to twice daily and does not think that it helps. She has inhalers but can not use them due to arthritis in her hands.   She was ambulated for 300 feet around the office today, beginning O2 sat was 94% with HR 75; after walking she had moderate dyspnea, her sat was 88% and HR 85.   Medication:   Outpatient Encounter Prescriptions as of 04/23/2015  Medication Sig  . acetaminophen (TYLENOL) 650 MG CR tablet Take 1,300 mg by mouth at bedtime as needed for pain.  [name].  atenolol (TENORMIN) 25 MG tablet Take 1 tablet (25 mg total) by mouth 2 (two) times daily.  [name]. atorvastatin (LIPITOR) 40 MG tablet Take 0.5 tablets (20 mg total) by mouth at bedtime.  . benzonatate (TESSALON) 200 MG capsule Take 1 capsule (200 mg total) by mouth 3 (three) times daily as needed for cough.  . diltiazem (CARDIZEM CD) 180 MG 24 hr capsule TAKE ONE CAPSULE DAILY  . docusate sodium (COLACE) 100 MG capsule Take 1 capsule (100 mg total) by mouth 2 (two) times daily.  . dorzolamide-timolol (COSOPT) 22.3-6.8 MG/ML ophthalmic solution Place 1 drop into both eyes 2 (two) times daily.   . feeding supplement, ENSURE ENLIVE, (ENSURE ENLIVE) LIQD Take 237 mLs by mouth 2 (two) times daily with a meal.  . furosemide (LASIX) 40 MG tablet Take 1 tablet (40 mg total) by mouth 2 (two) times daily.  [name]. HYDROcodone-acetaminophen (NORCO) 10-325 MG tablet Take 1 tablet by mouth 3 (three) times daily as needed for moderate pain.  [name]. ipratropium-albuterol (DUONEB) 0.5-2.5 (3) MG/3ML SOLN Take 3 mLs by nebulization every 6 (six) hours.  [name]. levothyroxine (SYNTHROID, LEVOTHROID) 112 MCG tablet Take 1 tablet (112 mcg total) by mouth daily.  . mirtazapine (REMERON) 15 MG tablet Take 1 tablet (15 mg total) by mouth at bedtime. (Patient taking differently: Take 15 mg by mouth at bedtime as needed (for sleep.). )  . mometasone-formoterol (DULERA) 100-5 MCG/ACT AERO Inhale 2 puffs into the lungs 2 (two) times daily.  .[name]  ondansetron (ZOFRAN) 8 MG tablet Take 1 tablet (8 mg total) by mouth every 8 (eight) hours as needed for nausea or vomiting.  . OXYGEN Inhale 2 L/min into the lungs at bedtime.  . polyethylene glycol (MIRALAX / GLYCOLAX) packet Take 17 g by mouth at bedtime.   . potassium chloride (K-DUR) 10 MEQ tablet Take 10 mEq by mouth 2 (two) times daily.  . traMADol (ULTRAM) 50 MG tablet Take 1 tablet (50 mg total) by mouth 3 (three) times daily as needed.  . Travoprost, BAK Free, (TRAVATAN) 0.004 % SOLN ophthalmic  solution Place 1 drop into both eyes at bedtime.  . Vitamin D, Ergocalciferol, (DRISDOL) 50000 UNITS CAPS capsule Take 50,000 Units by mouth every 7 (seven) days. Pt takes on Saturday.  . warfarin (COUMADIN) 5 MG tablet Take 1 tablet (5 mg total) by mouth daily at 6 PM. Take 1 tablet by mouth daily or as directed by coumadin clinic   No facility-administered encounter medications on file as of 04/23/2015.     Allergies:  Amoxicillin; Cetylpyridinium; Digoxin; and Risedronate sodium  Review of Systems: Gen:  Denies  fever, sweats. HEENT: Denies blurred vision. Cvc:  No dizziness, chest pain or heaviness Resp:   Denies cough or sputum porduction. Gi: Denies swallowing difficulty, stomach pain. constipation, bowel incontinence Gu:  Denies bladder incontinence, burning urine Ext:   No Joint pain, stiffness. Skin: No skin rash, easy bruising. Endoc:  No polyuria, polydipsia. Psych: No depression, insomnia. Other:  All other systems were reviewed and found to be negative other than what is mentioned in the HPI.   Physical Examination:   VS: BP 142/78 mmHg  Pulse 75  Ht  (1.6 m)  Wt 102 lb 12.8 oz (46.63 kg)  BMI 18.21 kg/m2  SpO2 91%  General Appearance: No distress  Neuro:without focal findings,  speech normal,  HEENT: PERRLA, EOM intact. Pulmonary: normal breath sounds, No wheezing.   CardiovascularNormal S1,S2.  No m/r/g.   Abdomen: Benign, Soft, non-tender. Renal:  No costovertebral tenderness  GU:  Not performed at this time. Endoc: No evident thyromegaly, no signs of acromegaly. Skin:   warm, no rash. Extremities: normal, no cyanosis, clubbing.   LABORATORY PANEL:   CBC No results for input(s): WBC, HGB, HCT, PLT in the last 168 hours. ------------------------------------------------------------------------------------------------------------------  Chemistries  No results for input(s): NA, K, CL, CO2, GLUCOSE, BUN, CREATININE, CALCIUM, MG, AST, ALT, ALKPHOS,  BILITOT in the last 168 hours.  Invalid input(s): GFRCGP ------------------------------------------------------------------------------------------------------------------  Cardiac Enzymes No results for input(s): TROPONINI in the last 168 hours. ------------------------------------------------------------  RADIOLOGY:   No results found for this or any previous visit. Results for orders placed during the hospital encounter of 04/04/15  DG Chest 2 View   Narrative CLINICAL DATA:  Right-sided chest pain. Recent ultrasound-guided thoracentesis of right pleural effusion.  EXAM: CHEST  2 VIEW  COMPARISON:  Most recent radiographs 03/28/2015, chest CT 03/27/2015  FINDINGS: Dual lead left-sided pacemaker remains in place. Cardiomegaly is stable. No pneumothorax post recent thoracentesis. Persistent right basilar opacity, likely combination of pleural fluid and atelectasis. Improving left lung base aeration. Linear peripheral opacity in the left lower lung zone, unchanged, suggesting atelectasis. Upper lungs are clear. No pulmonary edema.  IMPRESSION: Right basilar opacity likely combination of pleural fluid and atelectasis. No new abnormalities are seen.   Electronically Signed   By: Rubye Oaks M.D.   On: 04/04/2015 19:34    ------------------------------------------------------------------------------------------------------------------  Thank  you for allowing [facility] Pulmonary, Critical  Care to assist in the care of your patient. Our recommendations are noted above.  Please contact us if we can be of further service.   Wells Guiles, MD.   Pulmonary and Critical Care Office Number: [phone]  Santiago Glad, M.D.  Stephanie Acre, M.D.  Billy Fischer, M.D

## 2015-04-23 NOTE — Addendum Note (Signed)
Addended by: Maxwell MarionBLANKENSHIP, Rea Kalama A on: 04/23/2015 11:23 AM   Modules accepted: Orders

## 2015-04-24 LAB — PROTIME-INR: INR: 2 — AB (ref 0.9–1.1)

## 2015-04-25 ENCOUNTER — Ambulatory Visit (INDEPENDENT_AMBULATORY_CARE_PROVIDER_SITE_OTHER): Payer: Medicare Other | Admitting: Cardiology

## 2015-04-25 DIAGNOSIS — I482 Chronic atrial fibrillation, unspecified: Secondary | ICD-10-CM

## 2015-04-26 ENCOUNTER — Telehealth: Payer: Self-pay | Admitting: Internal Medicine

## 2015-04-26 DIAGNOSIS — I272 Pulmonary hypertension, unspecified: Secondary | ICD-10-CM

## 2015-04-26 LAB — CUP PACEART INCLINIC DEVICE CHECK
Date Time Interrogation Session: 20170330115354
Implantable Lead Implant Date: 20100525
Implantable Lead Model: 5076
Implantable Lead Model: 5076
MDC IDC LEAD IMPLANT DT: 20100525
MDC IDC LEAD LOCATION: 753859
MDC IDC LEAD LOCATION: 753860

## 2015-04-26 NOTE — Telephone Encounter (Signed)
Pt has requested smaller tanks or POC. In order for APS to preform this test APS needs referral placed to "Titrate for Conserving Device". Laurie Larsen

## 2015-05-01 ENCOUNTER — Encounter: Payer: Self-pay | Admitting: *Deleted

## 2015-05-01 ENCOUNTER — Other Ambulatory Visit: Payer: Self-pay | Admitting: *Deleted

## 2015-05-02 NOTE — Patient Outreach (Signed)
[facility]) Care Management   Home visit done 05/01/15  Laurie Larsen 04-14-1923 [id number]  Laurie Larsen is an 82 y.o. female  Subjective: Pt reports moderate loss of appetite, has Ensure,currently not taking.  Pt reports deals with  Depression at times, caregiver to spouse with Dementia.   Pt declined referral to [facility] social worker, if want to talk to someone, will talk to pastor at her new  church.  Pt reports does not know a lot of people at her church, attended Bible study at old church/misses.  Pt reports does not weigh everyday,using O2 at night.    Objective:   Filed Vitals:   05/01/15 1120  BP: 140/80  Pulse: 70  Resp: 20    ROS  Physical Exam  Constitutional: She is oriented to person, place, and time.  Thin   Cardiovascular: Normal rate.   Respiratory: Effort normal and breath sounds normal.  GI: Soft. Bowel sounds are normal.  Musculoskeletal:  Left ankle- trace edema   Neurological: She is alert and oriented to person, place, and time.  Skin: Skin is warm and dry.  Psychiatric: She has a normal mood and affect. Her behavior is normal. Judgment and thought content normal.    Encounter Medications:  Reviewed with pt.  Outpatient Encounter Prescriptions as of 05/01/2015  Medication Sig Note  . acetaminophen (TYLENOL) 650 MG CR tablet Take 1,300 mg by mouth at bedtime as needed for pain.   [name] atenolol (TENORMIN) 25 MG tablet Take 1 tablet (25 mg total) by mouth 2 (two) times daily.   [name] atorvastatin (LIPITOR) 40 MG tablet Take 0.5 tablets (20 mg total) by mouth at bedtime.   [name] diltiazem (CARDIZEM CD) 180 MG 24 hr capsule TAKE ONE CAPSULE DAILY   . dorzolamide-timolol (COSOPT) 22.3-6.8 MG/ML ophthalmic solution Place 1 drop into both eyes 2 (two) times daily.    . furosemide (LASIX) 40 MG tablet Take 1 tablet (40 mg total) by mouth 2 (two) times daily. 05/01/2015: Pt reports takes one daily, two if swelling.   [name] HYDROcodone-acetaminophen (NORCO)  10-325 MG tablet Take 1 tablet by mouth 3 (three) times daily as needed for moderate pain. 05/01/2015: As needed   . levothyroxine (SYNTHROID, LEVOTHROID) 112 MCG tablet Take 1 tablet (112 mcg total) by mouth daily.   . OXYGEN Inhale 2 L/min into the lungs at bedtime.   . polyethylene glycol (MIRALAX / GLYCOLAX) packet Take 17 g by mouth at bedtime. Reported on 05/01/2015   . potassium chloride (K-DUR) 10 MEQ tablet Take 10 mEq by mouth 2 (two) times daily.   . traMADol (ULTRAM) 50 MG tablet Take 1 tablet (50 mg total) by mouth 3 (three) times daily as needed. 05/01/2015: Take in between Hydrocodone   . Travoprost, BAK Free, (TRAVATAN) 0.004 % SOLN ophthalmic solution Place 1 drop into both eyes at bedtime.   . Vitamin D, Ergocalciferol, (DRISDOL) 50000 UNITS CAPS capsule Take 50,000 Units by mouth every 7 (seven) days. Pt takes on Saturday.   . warfarin (COUMADIN) 5 MG tablet Take 1 tablet (5 mg total) by mouth daily at 6 PM. Take 1 tablet by mouth daily or as directed by coumadin clinic   . benzonatate (TESSALON) 200 MG capsule Take 1 capsule (200 mg total) by mouth 3 (three) times daily as needed for cough. (Patient not taking: Reported on 05/01/2015)   . docusate sodium (COLACE) 100 MG capsule Take 1 capsule (100 mg total) by mouth 2 (two) times daily. (  Patient not taking: Reported on 05/01/2015)   . feeding supplement, ENSURE ENLIVE, (ENSURE ENLIVE) LIQD Take 237 mLs by mouth 2 (two) times daily with a meal. (Patient not taking: Reported on 05/01/2015)   . ipratropium-albuterol (DUONEB) 0.5-2.5 (3) MG/3ML SOLN Take 3 mLs by nebulization every 6 (six) hours.   . mirtazapine (REMERON) 15 MG tablet Take 1 tablet (15 mg total) by mouth at bedtime. (Patient taking differently: Take 15 mg by mouth at bedtime as needed (for sleep.). )   . mometasone-formoterol (DULERA) 100-5 MCG/ACT AERO Inhale 2 puffs into the lungs 2 (two) times daily. (Patient not taking: Reported on 05/01/2015)   . ondansetron (ZOFRAN) 8 MG  tablet Take 1 tablet (8 mg total) by mouth every 8 (eight) hours as needed for nausea or vomiting. (Patient not taking: Reported on 05/01/2015)    No facility-administered encounter medications on file as of 05/01/2015.    Functional Status:   In your present state of health, do you have any difficulty performing the following activities: 05/01/2015 03/25/2015  Hearing? Y N  Vision? Y N  Difficulty concentrating or making decisions? N N  Walking or climbing stairs? Y N  Dressing or bathing? N N  Doing errands, shopping? Y N  Preparing Food and eating ? N -  Using the Toilet? N -  In the past six months, have you accidently leaked urine? Y -  Do you have problems with loss of bowel control? N -  Managing your Medications? N -  Managing your Finances? Y -  Housekeeping or managing your Housekeeping? Y -    Fall/Depression Screening:    PHQ 2/9 Scores 05/01/2015  PHQ - 2 Score 4  PHQ- 9 Score 8    Assessment:   thin frail looking 82 year old  Woman, resides with spouse (Dementia).                             HF- pt reports not weighing every day, last weight 101 lbs., lungs clear, trace edema noted in left ankle.                          Impaired nutrition- pt reports moderate loss of appetite. BMI - under.    Plan:  Pt to start back taking Ensure, do frequent small meals to help with weight gain/nutritional status.            Pt to start weighing daily, record, call MD for weight gain of 3 lbs in a day, 5 lbs in a week.            As discussed, pt to look into going to Bible study at new church- fellowship             Plan to send home visit encounter to Dr. Gilford Rile - in basket in [place].              Plan to continue to provide community nurse case management services, next h/v 5/5.  THN CM Care Plan Problem One        Most Recent Value   Care Plan Problem One  Better management of respiratory issues.    Role Documenting the Problem One  Care Management [place] for Problem  One  Active   THN Long Term Goal (31-90 days)  Pt's O2 saturations would be >90 % for the next  60 days  THN Long Term Goal Start Date  04/23/15   Interventions for Problem One Long Term Goal  reinforced taking deep breaths, O2 at rest 90%, after deep breaths 96%.    THN CM Short Term Goal #1 (0-30 days)  Pt would do nebulizer treatments twice a day for the next 30 days    THN CM Short Term Goal #1 Start Date  04/23/15   THN CM Short Term Goal #1 Met Date  -- [not met, pt not doing nebulizer treatments. ]   Interventions for Short Term Goal #1  Reinforced with pt compliance with taking nebulizer treatments twice a day as suggested by MD.     [facility] CM Care Plan Problem Two        Most Recent Value   Care Plan Problem Two  Impaired nutrition-  moderate loss of appetite    Role Documenting the Problem Two  Care Management Coordinator   Care Plan for Problem Two  Active   THN CM Short Term Goal #1 (0-30 days)  Pt would have a weight gain in the next 30 days    THN CM Short Term Goal #1 Start Date  05/01/15   Interventions for Short Term Goal #2   Discussed with pt start back on Ensure -one a day, small frequent meals.     THN CM Care Plan Problem Three        Most Recent Value   Care Plan Problem Three  HF- pt not weighing daily    Role Documenting the Problem Three  Care Management Coordinator   Care Plan for Problem Three  Active   THN CM Short Term Goal #1 (0-30 days)  Pt would start weighing daily, record for the next 30 days    THN CM Short Term Goal #1 Start Date  05/01/15   Interventions for Short Term Goal #1  Discussed with pt importance of weighing, recording, call MD for weight gain of 3 lbs in a day,5 lbs in a week.  Also provided pt with information on Low Na+ diet.      Zara Chess.   [facility] Care Management  [phone]

## 2015-05-09 ENCOUNTER — Encounter: Payer: Self-pay | Admitting: Internal Medicine

## 2015-05-09 ENCOUNTER — Ambulatory Visit (INDEPENDENT_AMBULATORY_CARE_PROVIDER_SITE_OTHER): Payer: Medicare Other | Admitting: Internal Medicine

## 2015-05-09 VITALS — BP 107/69 | HR 81 | Temp 97.9°F | Ht 63.0 in | Wt 104.1 lb

## 2015-05-09 DIAGNOSIS — I482 Chronic atrial fibrillation, unspecified: Secondary | ICD-10-CM

## 2015-05-09 DIAGNOSIS — G894 Chronic pain syndrome: Secondary | ICD-10-CM | POA: Diagnosis not present

## 2015-05-09 DIAGNOSIS — I5032 Chronic diastolic (congestive) heart failure: Secondary | ICD-10-CM | POA: Diagnosis not present

## 2015-05-09 LAB — COMPREHENSIVE METABOLIC PANEL
ALBUMIN: 3.9 g/dL (ref 3.5–5.2)
ALK PHOS: 88 U/L (ref 39–117)
ALT: 13 U/L (ref 0–35)
AST: 18 U/L (ref 0–37)
BILIRUBIN TOTAL: 0.6 mg/dL (ref 0.2–1.2)
BUN: 23 mg/dL (ref 6–23)
CALCIUM: 8.8 mg/dL (ref 8.4–10.5)
CO2: 25 mEq/L (ref 19–32)
Chloride: 105 mEq/L (ref 96–112)
Creatinine, Ser: 0.89 mg/dL (ref 0.40–1.20)
GFR: 63.31 mL/min (ref >60.00)
GLUCOSE: 106 mg/dL — AB (ref 70–99)
Potassium: 4.1 mEq/L (ref 3.5–5.1)
Sodium: 137 mEq/L (ref 135–145)
TOTAL PROTEIN: 6.8 g/dL (ref 6.0–8.3)

## 2015-05-09 MED ORDER — HYDROCODONE-ACETAMINOPHEN 10-325 MG PO TABS: 1.0000 | ORAL_TABLET | Freq: Three times a day (TID) | ORAL | Status: DC | PRN

## 2015-05-09 NOTE — Patient Instructions (Signed)
Labs today.  Follow up in 4 weeks. 

## 2015-05-09 NOTE — Assessment & Plan Note (Signed)
Euvolemic on exam today. Continue current medication. 

## 2015-05-09 NOTE — Assessment & Plan Note (Signed)
Regular rhythm on exam today. Reviewed cardiology notes. Plan to leave current pacer in place. Recent INR 2. Will continue current medication.

## 2015-05-09 NOTE — Progress Notes (Signed)
Subjective:    Patient ID: Laurie Larsen, female    DOB: 1923/03/21, 82 y.o.   MRN: [id number]  HPI  82YO female presents for follow up.  Continues to have some intermittent dyspnea. Feels that this is made worse by pollen.  Recently seen by pulmonary. Starting on oxygen 24/7. Also seen by Dr. Graciela Husbands.  He recommended against changing the battery in pacer.  Notes a bruise on right knee. This has been present intermittently over the last few months.  Some swelling in ankles at times. Keeping legs elevated at night.  Continues on Hydrocodone up to three times per day. Symptoms of pain improved with this. Taking Miralax or Colace during day to prevent constipation.  Wt Readings from Last 3 Encounters:  05/09/15 104 lb 2 oz (47.231 kg)  05/01/15 101 lb (45.813 kg)  04/23/15 102 lb 12.8 oz (46.63 kg)   BP Readings from Last 3 Encounters:  05/09/15 107/69  05/01/15 140/80  04/23/15 142/78    Past Medical History  Diagnosis Date  . Permanent atrial fibrillation (HCC)     a. Chronic Coumadin.  [name] HTN (hypertension)   . Chronic diastolic CHF (congestive heart failure), NYHA class 1 (HCC)     a. 04/2013 Echo: EF 50-55%, triv MR, mildly dil LA, PASP , mild TR, mild-mod RA dil.  [name] CAD (coronary artery disease)     a. 2005 Cath: 100% dLCX-->Med managed.  . Sick sinus syndrome (HCC)     a. 05/2008 s/p MDT EnRhythm DC PPM ([facility]).  . Chronic pain     a. ? due to arthritis  . Glaucoma   . Hyperlipidemia   . Diverticulitis large intestine   . Allergy   . Phlebitis     a. after pacemeker placement in 2010.  [name] Colon polyps   . Hypothyroidism   . Transfusion history     a. After childbirth 60 years ago (1957)  . Rash/skin eruption     a. Multiple episodes - wide distribution-intermittent, possibly drug rash.  . Pulmonary HTN (HCC)     a. PASP on echo 04/2013.  [name] Chronic Right Pleural Effusion     a. 07/2012 s/p thoracentesis - Followed by Dr. Delford Field [facility]  Pulmonology.  . Arthritis   . Bronchitis   . Hay fever   . Colon polyps   . History of blood transfusion   . Urine incontinence   . Back complaints   . Acalculous cholecystitis   . CHF (congestive heart failure) (HCC)    Family History  Problem Relation Age of Onset  . Cancer Mother     colon with mets  . Diabetes Mother   . Heart disease Father   . Hyperlipidemia Father   . Hypertension Father   . Heart attack Father   . Stroke Brother     brother who died 06-17-22  . Cancer Paternal Aunt     breast  . Cancer Cousin     uterine, lung cancer  . Heart attack Brother    Past Surgical History  Procedure Laterality Date  . Appendectomy  1939  . Abdominal hysterectomy  1977  . Tonsillectomy  82 yrs old  . Kidney surgery  1955    Right  . Ankle fracture surgery  1979    Left, pin  . Pacemaker insertion  06/20/08    MDT EnRhythm DR implanted by Dr Reyes Ivan  . Breast surgery      Needle guided excision, right breast  calcification  . Breast fibroadenoma surgery  2010    Right  . Skin graft      left leg  . Breast biopsy  2011   Social History   Social History  . Marital Status: Married    Spouse Name: Lorella NimrodHarvey  . Number of Children: 3  . Years of Education: 13   Occupational History  . RETIRED    Social History Main Topics  . Smoking status: Never Smoker   . Smokeless tobacco: Never Used  . Alcohol Use: No  . Drug Use: No  . Sexual Activity: No   Other Topics Concern  . None   Social History Narrative   [place]. Three sons.      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   Work: [employer] and then [employer] dept - retired.     Married 1948.    3 sons - '52, '57, '58; 5 grandchildren-one deceased -  OD @ 8321.        End of Life Care: no prolonged heroic measures, i.e. Artificial feeding or hydration; DNR; no prolonged intubation.    Review of Systems  Constitutional: Negative for fever, chills, appetite change,  fatigue and unexpected weight change.  Eyes: Negative for visual disturbance.  Respiratory: Positive for cough and shortness of breath.   Cardiovascular: Negative for chest pain, palpitations and leg swelling.  Gastrointestinal: Negative for nausea, vomiting, abdominal pain, diarrhea and constipation.  Musculoskeletal: Positive for myalgias, back pain and arthralgias.  Skin: Negative for color change and rash.  Neurological: Positive for weakness.  Hematological: Negative for adenopathy. Does not bruise/bleed easily.  Psychiatric/Behavioral: Positive for sleep disturbance. Negative for dysphoric mood. The patient is not nervous/anxious.        Objective:    BP 107/69 mmHg  Pulse 81  Temp(Src) 97.9 F (36.6 C) (Oral)  Ht 5\' 3"  (1.6 m)  Wt 104 lb 2 oz (47.231 kg)  BMI 18.45 kg/m2  SpO2 95% Physical Exam  Constitutional: She is oriented to person, place, and time. She appears well-developed and well-nourished. No distress.  HENT:  Head: Normocephalic and atraumatic.  Right Ear: External ear normal.  Left Ear: External ear normal.  Nose: Nose normal.  Mouth/Throat: Oropharynx is clear and moist. No oropharyngeal exudate.  Eyes: Conjunctivae are normal. Pupils are equal, round, and reactive to light. Right eye exhibits no discharge. Left eye exhibits no discharge. No scleral icterus.  Neck: Normal range of motion. Neck supple. No tracheal deviation present. No thyromegaly present.  Cardiovascular: Normal rate, regular rhythm, normal heart sounds and intact distal pulses.  Exam reveals no gallop and no friction rub.   No murmur heard. Pulmonary/Chest: Effort normal and breath sounds normal. No respiratory distress. She has no wheezes. She has no rales. She exhibits no tenderness.  Musculoskeletal: Normal range of motion. She exhibits no edema or tenderness.  Lymphadenopathy:    She has no cervical adenopathy.  Neurological: She is alert and oriented to person, place, and time. No  cranial nerve deficit. She exhibits normal muscle tone. Coordination normal.  Skin: Skin is warm and dry. Ecchymosis (purple ecchymosis right anterior knee) noted. No rash noted. She is not diaphoretic. No erythema. No pallor.  Psychiatric: She has a normal mood and affect. Her behavior is normal. Judgment and thought content normal.          Assessment & Plan:   Problem List Items Addressed This Visit  Unprioritized   ATRIAL FIBRILLATION, CHRONIC - Primary    Regular rhythm on exam today. Reviewed cardiology notes. Plan to leave current pacer in place. Recent INR 2. Will continue current medication.      Relevant Orders   Comprehensive metabolic panel   Chronic pain syndrome    Chronic pain from scoliosis. Symptoms well controlled on Hydrocodone. She understands risks of medication and need to limit dosing as much as possible. Will continue.      Diastolic CHF, chronic (HCC)    Euvolemic on exam today. Continue current medication.          Return in about 4 weeks (around 06/06/2015) for Recheck.  Ronna Polio, MD Internal Medicine [facility]

## 2015-05-09 NOTE — Assessment & Plan Note (Addendum)
Chronic pain from scoliosis. Symptoms well controlled on Hydrocodone. She understands risks of medication and need to limit dosing as much as possible. Will continue.

## 2015-05-09 NOTE — Progress Notes (Signed)
Pre visit review using our clinic review tool, if applicable. No additional management support is needed unless otherwise documented below in the visit note. 

## 2015-05-14 ENCOUNTER — Emergency Department: Payer: Medicare Other

## 2015-05-14 ENCOUNTER — Emergency Department
Admission: EM | Admit: 2015-05-14 | Discharge: 2015-05-14 | Disposition: A | Discharge status: Home / Self Care | Payer: Medicare Other | Attending: Emergency Medicine | Admitting: Emergency Medicine

## 2015-05-14 ENCOUNTER — Encounter: Payer: Self-pay | Admitting: Emergency Medicine

## 2015-05-14 DIAGNOSIS — E876 Hypokalemia: Secondary | ICD-10-CM | POA: Diagnosis not present

## 2015-05-14 DIAGNOSIS — I482 Chronic atrial fibrillation: Secondary | ICD-10-CM | POA: Diagnosis not present

## 2015-05-14 DIAGNOSIS — R0600 Dyspnea, unspecified: Secondary | ICD-10-CM | POA: Diagnosis not present

## 2015-05-14 DIAGNOSIS — I5032 Chronic diastolic (congestive) heart failure: Secondary | ICD-10-CM | POA: Diagnosis not present

## 2015-05-14 DIAGNOSIS — Z86010 Personal history of colonic polyps: Secondary | ICD-10-CM | POA: Diagnosis not present

## 2015-05-14 DIAGNOSIS — R5383 Other fatigue: Secondary | ICD-10-CM | POA: Diagnosis not present

## 2015-05-14 DIAGNOSIS — I2510 Atherosclerotic heart disease of native coronary artery without angina pectoris: Secondary | ICD-10-CM | POA: Insufficient documentation

## 2015-05-14 DIAGNOSIS — E039 Hypothyroidism, unspecified: Secondary | ICD-10-CM | POA: Insufficient documentation

## 2015-05-14 DIAGNOSIS — E785 Hyperlipidemia, unspecified: Secondary | ICD-10-CM | POA: Insufficient documentation

## 2015-05-14 DIAGNOSIS — Z79899 Other long term (current) drug therapy: Secondary | ICD-10-CM | POA: Insufficient documentation

## 2015-05-14 DIAGNOSIS — R0602 Shortness of breath: Secondary | ICD-10-CM | POA: Diagnosis present

## 2015-05-14 DIAGNOSIS — J90 Pleural effusion, not elsewhere classified: Secondary | ICD-10-CM | POA: Insufficient documentation

## 2015-05-14 DIAGNOSIS — I110 Hypertensive heart disease with heart failure: Secondary | ICD-10-CM | POA: Diagnosis not present

## 2015-05-14 DIAGNOSIS — G8929 Other chronic pain: Secondary | ICD-10-CM | POA: Insufficient documentation

## 2015-05-14 DIAGNOSIS — Z7901 Long term (current) use of anticoagulants: Secondary | ICD-10-CM | POA: Diagnosis not present

## 2015-05-14 LAB — COMPREHENSIVE METABOLIC PANEL
ALT: 17 U/L (ref 14–54)
AST: 25 U/L (ref 15–41)
Albumin: 3.9 g/dL (ref 3.5–5.0)
Alkaline Phosphatase: 93 U/L (ref 38–126)
Anion gap: 9 (ref 5–15)
BUN: 32 mg/dL — AB (ref 6–20)
CALCIUM: 8.4 mg/dL — AB (ref 8.9–10.3)
CHLORIDE: 100 mmol/L — AB (ref 101–111)
CO2: 27 mmol/L (ref 22–32)
CREATININE: 1.12 mg/dL — AB (ref 0.44–1.00)
GFR calc Af Amer: 49 mL/min — ABNORMAL LOW (ref >60)
GFR, EST NON AFRICAN AMERICAN: 42 mL/min — AB (ref >60)
Glucose, Bld: 125 mg/dL — ABNORMAL HIGH (ref 65–99)
Potassium: 2.8 mmol/L — CL (ref 3.5–5.1)
Sodium: 136 mmol/L (ref 135–145)
TOTAL PROTEIN: 6.7 g/dL (ref 6.5–8.1)
Total Bilirubin: 1 mg/dL (ref 0.3–1.2)

## 2015-05-14 LAB — CBC
HEMATOCRIT: 35.9 % (ref 35.0–47.0)
HEMOGLOBIN: 12.4 g/dL (ref 12.0–16.0)
MCH: 31.4 pg (ref 26.0–34.0)
MCHC: 34.5 g/dL (ref 32.0–36.0)
MCV: 91.1 fL (ref 80.0–100.0)
Platelets: 206 10*3/uL (ref 150–440)
RBC: 3.93 MIL/uL (ref 3.80–5.20)
RDW: 16 % — ABNORMAL HIGH (ref 11.5–14.5)
WBC: 8.4 10*3/uL (ref 3.6–11.0)

## 2015-05-14 LAB — URINALYSIS COMPLETE WITH MICROSCOPIC (ARMC ONLY)
BACTERIA UA: NONE SEEN
Bilirubin Urine: NEGATIVE
Glucose, UA: NEGATIVE mg/dL
HGB URINE DIPSTICK: NEGATIVE
KETONES UR: NEGATIVE mg/dL
LEUKOCYTES UA: NEGATIVE
NITRITE: NEGATIVE
PH: 6 (ref 5.0–8.0)
Protein, ur: NEGATIVE mg/dL
SPECIFIC GRAVITY, URINE: 1.015 (ref 1.005–1.030)

## 2015-05-14 LAB — TROPONIN I

## 2015-05-14 MED ORDER — POTASSIUM CHLORIDE CRYS ER 20 MEQ PO TBCR
EXTENDED_RELEASE_TABLET | ORAL | Status: AC
  Filled 2015-05-14: qty 1

## 2015-05-14 MED ORDER — POTASSIUM CHLORIDE ER 10 MEQ PO TBCR
10.0000 meq | EXTENDED_RELEASE_TABLET | Freq: Every day | ORAL | Status: DC
Start: 2015-05-14 — End: 2015-08-02

## 2015-05-14 NOTE — ED Notes (Signed)
States has felt tired today. [place] [facility] [place] husband. States took Lasix late yesterday and so was up more at night than usual and took 2 vicodin for back pain today. Denies chest pain or SOB.

## 2015-05-14 NOTE — ED Provider Notes (Signed)
[facility] Emergency Department Provider Note  Time seen: 5:10 PM  I have reviewed the triage vital signs and the nursing notes.   HISTORY  Chief Complaint Fatigue    HPI Laurie Larsen is a 82 y.o. female with a past medical history of hypertension, CHF, hyperlipidemia, chronic back pain, presents to the emergency department for increased fatigue and shortness of breath. According to the patient she had increased ankle edema yesterday, so last night she took an additional dose of her fluid pill. She states she was up until 3 or 4:00 in the morning urinating. Today her back was bothering her and she took 2 Vicodin which she states is fairly normal for her. After lunch she was feeling very tired, the nurse was concerned about her fatigue and also the patient states she felt somewhat short of breath so the nurse entered to the emergency department for evaluation. Patient denies any shortness of breath at this time. She states she will occasionally get short of breath, she is prescribed oxygen use at night, and as needed during the day for shortness of breath. Denies any chest pain now or at any time. Denies any choking. Denies any vomiting. Denies any complaints at this time besides feeling tired.     Past Medical History  Diagnosis Date  . Permanent atrial fibrillation (HCC)     a. Chronic Coumadin.  [name] HTN (hypertension)   . Chronic diastolic CHF (congestive heart failure), NYHA class 1 (HCC)     a. 04/2013 Echo: EF 50-55%, triv MR, mildly dil LA, PASP , mild TR, mild-mod RA dil.  [name] CAD (coronary artery disease)     a. 2005 Cath: 100% dLCX-->Med managed.  . Sick sinus syndrome (HCC)     a. 05/2008 s/p MDT EnRhythm DC PPM ([facility]).  . Chronic pain     a. ? due to arthritis  . Glaucoma   . Hyperlipidemia   . Diverticulitis large intestine   . Allergy   . Phlebitis     a. after pacemeker placement in 2010.  [name] Colon polyps   . Hypothyroidism   .  Transfusion history     a. After childbirth 60 years ago (1955)  . Rash/skin eruption     a. Multiple episodes - wide distribution-intermittent, possibly drug rash.  . Pulmonary HTN (HCC)     a. PASP on echo 04/2013.  [name] Chronic Right Pleural Effusion     a. 07/2012 s/p thoracentesis - Followed by Dr. Delford Field [facility]mon[facility].  . Arthritis   . Bronchitis   . Hay fever   . Colon polyps   . History of blood transfusion   . Urine incontinence   . Back complaints   . Acalculous cholecystitis   . CHF (congestive heart failure) [facility])     Patient Active Problem List   Diagnosis Date Noted  . DNR (do not resuscitate) 04/06/2015  . Malnutrition of moderate degree 03/26/2015  . Insomnia 02/20/2015  . Macular degeneration 02/20/2015  . Chronic pain syndrome 02/20/2015  . Screening for breast cancer 02/20/2015  . Cholecystostomy tube dysfunction   . Dyspnea   . Glaucoma 11/22/2014  . Cholecystitis 10/17/2014  . Protein-calorie malnutrition, severe (HCC) 06/01/2014  . CHF (congestive heart failure), NYHA class I (HCC) 05/31/2014  . Cardiac pacemaker in situ 03/10/2014  . HLD (hyperlipidemia) 03/10/2014  . Pulmonary HTN (HCC) 06/09/2013  . Nocturnal hypoxemia 04/20/2013  . Depression 06/23/2011  . Diastolic CHF, chronic (HCC) 11/13/2010  .  Recurrent pleural effusion on right 10/01/2010  . Chronic anticoagulation 07/10/2010  . Hypothyroidism 04/24/2010  . Essential hypertension, benign 03/01/2010  . ATRIAL FIBRILLATION, CHRONIC 03/01/2010    Past Surgical History  Procedure Laterality Date  . Appendectomy  1939  . Abdominal hysterectomy  1977  . Tonsillectomy  82 yrs old  . Kidney surgery  1955    Right  . Ankle fracture surgery  1979    Left, pin  . Pacemaker insertion  06/20/08    MDT EnRhythm DR implanted by Dr Reyes IvanKersey  . Breast surgery      Needle guided excision, right breast calcification  . Breast fibroadenoma surgery  2010    Right  . Skin graft      left  leg  . Breast biopsy  2011    Current Outpatient Rx  Name  Route  Sig  Dispense  Refill  . acetaminophen (TYLENOL) 650 MG CR tablet   Oral   Take 1,300 mg by mouth at bedtime as needed for pain.         [name]. atenolol (TENORMIN) 25 MG tablet   Oral   Take 1 tablet (25 mg total) by mouth 2 (two) times daily.   180 tablet   3   . atorvastatin (LIPITOR) 40 MG tablet   Oral   Take 0.5 tablets (20 mg total) by mouth at bedtime.   45 tablet   3   . benzonatate (TESSALON) 200 MG capsule   Oral   Take 1 capsule (200 mg total) by mouth 3 (three) times daily as needed for cough.   20 capsule   0   . diltiazem (CARDIZEM CD) 180 MG 24 hr capsule      TAKE ONE CAPSULE DAILY   90 capsule   3   . docusate sodium (COLACE) 100 MG capsule   Oral   Take 1 capsule (100 mg total) by mouth 2 (two) times daily.   10 capsule   0   . dorzolamide-timolol (COSOPT) 22.3-6.8 MG/ML ophthalmic solution   Both Eyes   Place 1 drop into both eyes 2 (two) times daily.          . feeding supplement, ENSURE ENLIVE, (ENSURE ENLIVE) LIQD   Oral   Take 237 mLs by mouth 2 (two) times daily with a meal.   237 mL   12   . furosemide (LASIX) 40 MG tablet   Oral   Take 1 tablet (40 mg total) by mouth 2 (two) times daily.   60 tablet   0   . HYDROcodone-acetaminophen (NORCO) 10-325 MG tablet   Oral   Take 1 tablet by mouth 3 (three) times daily as needed for moderate pain.   90 tablet   0     [facility] Ethelene Halamos.  ...   . ipratropium-albuterol (DUONEB) 0.5-2.5 (3) MG/3ML SOLN   Nebulization   Take 3 mLs by nebulization every 6 (six) hours.   360 mL   0   . levothyroxine (SYNTHROID, LEVOTHROID) 112 MCG tablet   Oral   Take 1 tablet (112 mcg total) by mouth daily.   90 tablet   1   . mirtazapine (REMERON) 15 MG tablet   Oral   Take 1 tablet (15 mg total) by mouth at bedtime. Patient taking differently: Take 15 mg by mouth at bedtime as needed (for sleep.).     30 tablet   3   . mometasone-formoterol (DULERA) 100-5  MCG/ACT AERO   Inhalation   Inhale 2 puffs into the lungs 2 (two) times daily.   1 Inhaler   0   . OXYGEN   Inhalation   Inhale 2 L/min into the lungs at bedtime.         . polyethylene glycol (MIRALAX / GLYCOLAX) packet   Oral   Take 17 g by mouth at bedtime. Reported on 05/01/2015         . potassium chloride (K-DUR) 10 MEQ tablet   Oral   Take 10 mEq by mouth 2 (two) times daily.         . traMADol (ULTRAM) 50 MG tablet   Oral   Take 1 tablet (50 mg total) by mouth 3 (three) times daily as needed.   90 tablet   3   . Travoprost, BAK Free, (TRAVATAN) 0.004 % SOLN ophthalmic solution   Both Eyes   Place 1 drop into both eyes at bedtime.         . Vitamin D, Ergocalciferol, (DRISDOL) 50000 UNITS CAPS capsule   Oral   Take 50,000 Units by mouth every 7 (seven) days. Pt takes on Saturday.         . warfarin (COUMADIN) 5 MG tablet   Oral   Take 1 tablet (5 mg total) by mouth daily at 6 PM. Take 1 tablet by mouth daily or as directed by coumadin clinic   90 tablet   1     Allergies Amoxicillin; Cetylpyridinium; Digoxin; and Risedronate sodium  Family History  Problem Relation Age of Onset  . Cancer Mother     colon with mets  . Diabetes Mother   . Heart disease Father   . Hyperlipidemia Father   . Hypertension Father   . Heart attack Father   . Stroke Brother     brother who died 18-Jun-2022  . Cancer Paternal Aunt     breast  . Cancer Cousin     uterine, lung cancer  . Heart attack Brother     Social History Social History  Substance Use Topics  . Smoking status: Never Smoker   . Smokeless tobacco: Never Used  . Alcohol Use: No    Review of Systems Constitutional: Negative for fever. Cardiovascular: Negative for chest pain. Respiratory: Negative for shortness of breath. Gastrointestinal: Negative for abdominal pain, vomiting Genitourinary: Negative for dysuria. Neurological: Negative  for headaches, focal weakness or numbness. 10-point ROS otherwise negative.  ____________________________________________   PHYSICAL EXAM:  VITAL SIGNS: ED Triage Vitals  Enc Vitals Group     BP 05/14/15 1608 120/57 mmHg     Pulse Rate 05/14/15 1608 94     Resp 05/14/15 1608 18     Temp 05/14/15 1608 98.8 F (37.1 C)     Temp Source 05/14/15 1608 Oral     SpO2 05/14/15 1608 95 %     Weight 05/14/15 1608 95 lb (43.092 kg)     Height 05/14/15 1608 5\' 3"  (1.6 m)     Head Cir --      Peak Flow --      Pain Score 05/14/15 1609 0     Pain Loc --      Pain Edu? --      Excl. in GC? --     Constitutional: Alert and oriented. Well appearing and in no distress. Eyes: Normal exam ENT   Head: Normocephalic and atraumatic.   Mouth/Throat: Mucous membranes are moist. Cardiovascular: Irregular rhythm around 80 bpm.  Respiratory: Normal respiratory effort without tachypnea nor retractions. Breath sounds are clear. No wheezes, rales, rhonchi. Gastrointestinal: Soft and nontender. No distention.  Musculoskeletal: Nontender with normal range of motion in all extremities.  Neurologic:  Normal speech and language. No gross focal neurologic deficits Skin:  Skin is warm, dry and intact.  Psychiatric: Mood and affect are normal. Speech and behavior are normal.  ____________________________________________    EKG  EKG reviewed and interpreted by myself shows atrial fibrillation at 80 bpm, narrow QRS, normal axis, nonspecific ST changes. No acute elevation ST depressions are present in V6, Slightly increased from previous EKG. ____________________________________________    RADIOLOGY  Chest x-ray shows no acute abnormality  ____________________________________________    INITIAL IMPRESSION / ASSESSMENT AND PLAN / ED COURSE  Pertinent labs & imaging results that were available during my care of the patient were reviewed by me and considered in my medical decision making (see  chart for details).  Patient presents with increased fatigue and short of breath. Patient states the shortness of breath intermittently is normal for her and she is prescribed oxygen for this. As far as the fatigue goes the patient states she was up until 3 or 4:00 in the morning due to taking a nighttime dose of Lasix. She also took Vicodin this morning which could account for her fatigue. We will check labs including troponin, and a chest x-ray, and closely monitor in the emergency department. Patient states she did not want to come to the emergency department with the nurse made her.  Labs are largely within normal limits besides mild hypokalemia. We'll place the patient on potassium supplement for the next several days. Chest x-ray is negative. Patient states she is feeling better. Fatigue is likely due to decreased sleep overnight. We will discharge the patient home with primary care follow-up ____________________________________________   FINAL CLINICAL IMPRESSION(S) / ED DIAGNOSES  Fatigue Dyspnea Hypokalemia  Minna Antis, MD 05/14/15 [phone]

## 2015-05-14 NOTE — ED Notes (Signed)
Unable to reach [facility] staff however patient states understanding of instructions. States she has been missing doses of potassium and was not aware she had to take it when taking diuretics. Will return to taking regularly per her prescription and will follow up with MD for potassium recheck in one week.

## 2015-05-14 NOTE — Discharge Instructions (Signed)
Fatigue °Fatigue is feeling tired all of the time, a lack of energy, or a lack of motivation. Occasional or mild fatigue is often a normal response to activity or life in general. However, long-lasting (chronic) or extreme fatigue may indicate an underlying medical condition. °HOME CARE INSTRUCTIONS  °Watch your fatigue for any changes. The following actions may help to lessen any discomfort you are feeling: °· Talk to your health care provider about how much sleep you need each night. Try to get the required amount every night. °· Take medicines only as directed by your health care provider. °· Eat a healthy and nutritious diet. Ask your health care provider if you need help changing your diet. °· Drink enough fluid to keep your urine clear or pale yellow. °· Practice ways of relaxing, such as yoga, meditation, massage therapy, or acupuncture. °· Exercise regularly.   °· Change situations that cause you stress. Try to keep your work and personal routine reasonable. °· Do not abuse illegal drugs. °· Limit alcohol intake to no more than 1 drink per day for nonpregnant women and 2 drinks per day for men. One drink equals 12 ounces of beer, 5 ounces of wine, or 1½ ounces of hard liquor. °· Take a multivitamin, if directed by your health care provider. °SEEK MEDICAL CARE IF:  °· Your fatigue does not get better. °· You have a fever.   °· You have unintentional weight loss or gain. °· You have headaches.   °· You have difficulty:   °· Falling asleep. °· Sleeping throughout the night. °· You feel angry, guilty, anxious, or sad.    °· You are unable to have a bowel movement (constipation).   °· You skin is dry.    °· Your legs or another part of your body is swollen.   °SEEK IMMEDIATE MEDICAL CARE IF:  °· You feel confused.   °· Your vision is blurry. °· You feel faint or pass out.   °· You have a severe headache.   °· You have severe abdominal, pelvic, or back pain.   °· You have chest pain, shortness of breath, or an  irregular or fast heartbeat.   °· You are unable to urinate or you urinate less than normal.   °· You develop abnormal bleeding, such as bleeding from the rectum, vagina, nose, lungs, or nipples. °· You vomit blood.    °· You have thoughts about harming yourself or committing suicide.   °· You are worried that you might harm someone else.   °  °This information is not intended to replace advice given to you by your health care provider. Make sure you discuss any questions you have with your health care provider. °  °Document Released: 11/10/2006 Document Revised: 02/03/2014 Document Reviewed: 05/17/2013 °Elsevier Interactive Patient Education ©2016 [employer] ° °Hypokalemia °Hypokalemia means that the amount of potassium in the blood is lower than normal. Potassium is a chemical, called an electrolyte, that helps regulate the amount of fluid in the body. It also stimulates muscle contraction and helps nerves function properly. Most of the body's potassium is inside of cells, and only a very small amount is in the blood. Because the amount in the blood is so small, minor changes can be life-threatening. °CAUSES °· Antibiotics. °· Diarrhea or vomiting. °· Using laxatives too much, which can cause diarrhea. °· Chronic kidney disease. °· Water pills (diuretics). °· Eating disorders (bulimia). °· Low magnesium level. °· Sweating a lot. °SIGNS AND SYMPTOMS °· Weakness. °· Constipation. °· Fatigue. °· Muscle cramps. °· Mental confusion. °· Skipped heartbeats or irregular heartbeat (palpitations). °· Tingling or   numbness. °DIAGNOSIS  °Your health care provider can diagnose hypokalemia with blood tests. In addition to checking your potassium level, your health care provider may also check other lab tests. °TREATMENT °Hypokalemia can be treated with potassium supplements taken by mouth or adjustments in your current medicines. If your potassium level is very low, you may need to get potassium through a vein (IV) and be  monitored in the hospital. A diet high in potassium is also helpful. Foods high in potassium are: °· Nuts, such as peanuts and pistachios. °· Seeds, such as sunflower seeds and pumpkin seeds. °· Peas, lentils, and lima beans. °· Whole grain and bran cereals and breads. °· Fresh fruit and vegetables, such as apricots, avocado, bananas, cantaloupe, kiwi, oranges, tomatoes, asparagus, and potatoes. °· Orange and tomato juices. °· Red meats. °· Fruit yogurt. °HOME CARE INSTRUCTIONS °· Take all medicines as prescribed by your health care provider. °· Maintain a healthy diet by including nutritious food, such as fruits, vegetables, nuts, whole grains, and lean meats. °· If you are taking a laxative, be sure to follow the directions on the label. °SEEK MEDICAL CARE IF: °· Your weakness gets worse. °· You feel your heart pounding or racing. °· You are vomiting or having diarrhea. °· You are diabetic and having trouble keeping your blood glucose in the normal range. °SEEK IMMEDIATE MEDICAL CARE IF: °· You have chest pain, shortness of breath, or dizziness. °· You are vomiting or having diarrhea for more than 2 days. °· You faint. °MAKE SURE YOU:  °· Understand these instructions. °· Will watch your condition. °· Will get help right away if you are not doing well or get worse. °  °This information is not intended to replace advice given to you by your health care provider. Make sure you discuss any questions you have with your health care provider. °  °Document Released: 01/13/2005 Document Revised: 02/03/2014 Document Reviewed: 07/16/2012 °Elsevier Interactive Patient Education ©2016 [employer] Inc. ° °

## 2015-05-23 ENCOUNTER — Telehealth: Payer: Self-pay | Admitting: *Deleted

## 2015-05-23 LAB — PROTIME-INR: INR: 2.2 — AB (ref 0.9–1.1)

## 2015-05-23 NOTE — Telephone Encounter (Signed)
[facility]  Requesting a VO to do a urinalysis for burning & frequency. Ok'd by Dr. Dan HumphreysWalker & VO given.

## 2015-05-24 ENCOUNTER — Ambulatory Visit (INDEPENDENT_AMBULATORY_CARE_PROVIDER_SITE_OTHER): Payer: Medicare Other | Admitting: Cardiovascular Disease

## 2015-05-24 DIAGNOSIS — I482 Chronic atrial fibrillation, unspecified: Secondary | ICD-10-CM

## 2015-05-31 ENCOUNTER — Telehealth: Payer: Self-pay | Admitting: Internal Medicine

## 2015-05-31 NOTE — Telephone Encounter (Signed)
Called and spoke with patient and explained the above. Pt scheduled appointment for 06/15/15 at 10:15 to see DR to re-qualify for o2.  LMOAM for [facility] to return my call. Rhonda J Cobb

## 2015-05-31 NOTE — Telephone Encounter (Signed)
Spoke with Larita FifeLynn at [facility] and advised that patient scheduled appt and that we would obtain testing again. Also advised Larita FifeLynn with [facility] that pt stated that she did not understand how to use the m-6 tanks that [facility] provided for her.  Larita FifeLynn stated that she would have the RT go back out and re-educate patient on how to use the equipment. Nothing else needed at this time. Rhonda J Cobb

## 2015-05-31 NOTE — Telephone Encounter (Signed)
Please schedule pt for an OV appt. Thanks.

## 2015-05-31 NOTE — Telephone Encounter (Signed)
[facility] delivered the oxygen tanks today and pt is requesting POC as well.  As [facility] explained to patient that the patient must be on the portable tanks for a period of time before evaluation for POC.  Testing was done in our office on the date the order was placed for the o2 (04/23/15) .  This testing has expired (has to be within a month) of pt going on the oxygen.  Patient will have to have another office visit along with the three part testing.  Please advise. Rhonda J Cobb

## 2015-06-01 ENCOUNTER — Ambulatory Visit: Payer: Self-pay | Admitting: *Deleted

## 2015-06-07 ENCOUNTER — Other Ambulatory Visit: Payer: Self-pay | Admitting: *Deleted

## 2015-06-08 NOTE — Patient Outreach (Signed)
[facility] Care Management   Home visit done 06/07/15  Laurie Larsen 1923-07-18 [id number]  Laurie Larsen is an 82 y.o. female  Subjective:  Pt reports not doing good, so much trouble with breathing.  Pt reports she is suppose to wear  Her O2 during the day, but cannot handle the tanks that were delivered.  Pt report she called the company and was  Told she has to be recheck by MD to get the portable tanks.  Pt reports she is to f/u with MD 5/18- check O2 doing the walk test.  Pt reports to f/u with heart MD 5/18.    Objective:   Filed Vitals:   06/07/15 1603  BP: 120/60  Pulse: 70  Resp: 28    ROS  Physical Exam  Constitutional: She is oriented to person, place, and time.  BMI low.   Cardiovascular: Normal rate and normal heart sounds.   Respiratory: Effort normal and breath sounds normal.  On O2   GI: Soft.  Musculoskeletal: Normal range of motion.  Uses walker   Neurological: She is alert and oriented to person, place, and time.  Skin: Skin is warm and dry.  Psychiatric: Her behavior is normal. Judgment and thought content normal.  Crying  during home visit- wants to go to heaven.     Encounter Medications:  Reviewed with pt   Outpatient Encounter Prescriptions as of 06/07/2015  Medication Sig Note  . acetaminophen (TYLENOL) 650 MG CR tablet Take 1,300 mg by mouth at bedtime as needed for pain. 06/07/2015: Pt taking arthritis Tylenol   . atenolol (TENORMIN) 25 MG tablet Take 1 tablet (25 mg total) by mouth 2 (two) times daily.   [name] atorvastatin (LIPITOR) 40 MG tablet Take 0.5 tablets (20 mg total) by mouth at bedtime.   [name] diltiazem (CARDIZEM CD) 180 MG 24 hr capsule TAKE ONE CAPSULE DAILY   . docusate sodium (COLACE) 100 MG capsule Take 1 capsule (100 mg total) by mouth 2 (two) times daily.   . dorzolamide-timolol (COSOPT) 22.3-6.8 MG/ML ophthalmic solution Place 1 drop into both eyes 2 (two) times daily.    . feeding supplement, ENSURE ENLIVE,  (ENSURE ENLIVE) LIQD Take 237 mLs by mouth 2 (two) times daily with a meal. 06/07/2015: Once  A week   . furosemide (LASIX) 40 MG tablet Take 1 tablet (40 mg total) by mouth 2 (two) times daily. 06/07/2015: Pt reports takes one a day, two if swelling   . HYDROcodone-acetaminophen (NORCO) 10-325 MG tablet Take 1 tablet by mouth 3 (three) times daily as needed for moderate pain.   [name] ipratropium-albuterol (DUONEB) 0.5-2.5 (3) MG/3ML SOLN Take 3 mLs by nebulization every 6 (six) hours.   [name] levothyroxine (SYNTHROID, LEVOTHROID) 112 MCG tablet Take 1 tablet (112 mcg total) by mouth daily.   . OXYGEN Inhale 2 L/min into the lungs at bedtime.   . polyethylene glycol (MIRALAX / GLYCOLAX) packet Take 17 g by mouth at bedtime. Reported on 05/01/2015   . potassium chloride (K-DUR) 10 MEQ tablet Take 1 tablet (10 mEq total) by mouth daily. 06/07/2015: Pt takes twice a day   . traMADol (ULTRAM) 50 MG tablet Take 1 tablet (50 mg total) by mouth 3 (three) times daily as needed. 06/07/2015: Pt reports takes once a day   . Travoprost, BAK Free, (TRAVATAN) 0.004 % SOLN ophthalmic solution Place 1 drop into both eyes at bedtime.   . Vitamin D, Ergocalciferol, (DRISDOL) 50000 UNITS CAPS capsule Take 50,000  Units by mouth every 7 (seven) days. Pt takes on Saturday.   . warfarin (COUMADIN) 5 MG tablet Take 1 tablet (5 mg total) by mouth daily at 6 PM. Take 1 tablet by mouth daily or as directed by coumadin clinic   . benzonatate (TESSALON) 200 MG capsule Take 1 capsule (200 mg total) by mouth 3 (three) times daily as needed for cough. (Patient not taking: Reported on 06/07/2015)   . mirtazapine (REMERON) 15 MG tablet Take 1 tablet (15 mg total) by mouth at bedtime. (Patient not taking: Reported on 06/07/2015)   . mometasone-formoterol (DULERA) 100-5 MCG/ACT AERO Inhale 2 puffs into the lungs 2 (two) times daily. (Patient not taking: Reported on 06/07/2015)    No facility-administered encounter medications on file as of 06/07/2015.     Functional Status:   In your present state of health, do you have any difficulty performing the following activities: 05/01/2015 03/25/2015  Hearing? Y N  Vision? Y N  Difficulty concentrating or making decisions? N N  Walking or climbing stairs? Y N  Dressing or bathing? N N  Doing errands, shopping? Y N  Preparing Food and eating ? N -  Using the Toilet? N -  In the past six months, have you accidently leaked urine? Y -  Do you have problems with loss of bowel control? N -  Managing your Medications? N -  Managing your Finances? Y -  Housekeeping or managing your Housekeeping? Y -    Fall/Depression Screening:    PHQ 2/9 Scores 05/01/2015  PHQ - 2 Score 4  PHQ- 9 Score 8    Assessment:  82 year old woman, discouraged over her health issues, crying during home visit.  Pt just returned                         From a meeting, sob, applied oxygen - sob resolved.                           Hx of bronchitis, emphysema-  Pt needs to wear her O2 during the day, especially when taking long                           Walks.  O2 sat at rest with oxygen on- 94%,                          HF- no edema.   Weight down from last month.    Plan:  Pt to f/u with Lung MD next week,  Re qualify for O2.              Pt to f/u with Heart MD next week.             Plan to continue to provide community nurse case management services, next home visit 6/8.   THN CM Care Plan Problem One        Most Recent Value   Care Plan Problem One  Better management of respiratory issues.    Role Documenting the Problem One  Care Management Coordinator   Care Plan for Problem One  Active   THN Long Term Goal (31-90 days)  Pt's O2 saturations would be >90 % for the next  60 days    THN Long Term Goal Start Date  06/07/15   Interventions for Problem One  Long Term Goal  Reinforced use of O2 as needed during the day    THN CM Short Term Goal #1 (0-30 days)  Pt would be compliant to wear O2 during the day for the next  30 days    THN CM Short Term Goal #1 Start Date  06/07/15   Interventions for Short Term Goal #1  Discussed with pt compliance with wearing O2 during the day, especially with increased sob.     THN CM Care Plan Problem Two        Most Recent Value   Care Plan Problem Two  Impaired nutrition-  moderate loss of appetite    Role Documenting the Problem Two  Care Management Coordinator   Care Plan for Problem Two  Active   THN CM Short Term Goal #1 (0-30 days)  Pt would have a weight gain in the next 30 days    THN CM Short Term Goal #1 Start Date  05/01/15   THN CM Short Term Goal #1 Met Date   -- [not met- pt lost weight (6 lbs from last visit)]   Interventions for Short Term Goal #2   Discussed with pt start back on Ensure -one a day, small frequent meals.     THN CM Care Plan Problem Three        Most Recent Value   Care Plan Problem Three  HF- pt not weighing daily    Role Documenting the Problem Three  Care Management Coordinator   Care Plan for Problem Three  Active   THN CM Short Term Goal #1 (0-30 days)  Pt would start weighing daily, record for the next 30 days    THN CM Short Term Goal #1 Start Date  05/01/15   THN CM Short Term Goal #1 Met Date  -- [pt lost weight. ]   Interventions for Short Term Goal #1  Discussed with pt importance of weighing, recording, call MD for weight gain of 3 lbs in a day,5 lbs in a week.  Also provided pt with information on Low Na+ diet.       Zara Chess.   [facility]  [phone]

## 2015-06-13 ENCOUNTER — Emergency Department: Payer: Medicare Other

## 2015-06-13 ENCOUNTER — Ambulatory Visit (INDEPENDENT_AMBULATORY_CARE_PROVIDER_SITE_OTHER): Payer: Medicare Other | Admitting: Internal Medicine

## 2015-06-13 ENCOUNTER — Encounter: Payer: Self-pay | Admitting: Internal Medicine

## 2015-06-13 ENCOUNTER — Encounter: Payer: Self-pay | Admitting: Emergency Medicine

## 2015-06-13 ENCOUNTER — Inpatient Hospital Stay
Admission: EM | Admit: 2015-06-13 | Discharge: 2015-06-17 | DRG: 291 | Disposition: A | Discharge status: Home Health | Payer: Medicare Other | Attending: Internal Medicine | Admitting: Internal Medicine

## 2015-06-13 VITALS — HR 100 | Ht 63.0 in | Wt 106.8 lb

## 2015-06-13 DIAGNOSIS — J9621 Acute and chronic respiratory failure with hypoxia: Secondary | ICD-10-CM

## 2015-06-13 DIAGNOSIS — I272 Other secondary pulmonary hypertension: Secondary | ICD-10-CM | POA: Diagnosis present

## 2015-06-13 DIAGNOSIS — I110 Hypertensive heart disease with heart failure: Principal | ICD-10-CM | POA: Diagnosis present

## 2015-06-13 DIAGNOSIS — Z66 Do not resuscitate: Secondary | ICD-10-CM | POA: Diagnosis present

## 2015-06-13 DIAGNOSIS — H409 Unspecified glaucoma: Secondary | ICD-10-CM | POA: Diagnosis present

## 2015-06-13 DIAGNOSIS — Z7901 Long term (current) use of anticoagulants: Secondary | ICD-10-CM | POA: Diagnosis not present

## 2015-06-13 DIAGNOSIS — R0602 Shortness of breath: Secondary | ICD-10-CM

## 2015-06-13 DIAGNOSIS — E039 Hypothyroidism, unspecified: Secondary | ICD-10-CM | POA: Diagnosis present

## 2015-06-13 DIAGNOSIS — Z8249 Family history of ischemic heart disease and other diseases of the circulatory system: Secondary | ICD-10-CM | POA: Diagnosis not present

## 2015-06-13 DIAGNOSIS — Z888 Allergy status to other drugs, medicaments and biological substances status: Secondary | ICD-10-CM

## 2015-06-13 DIAGNOSIS — Z9981 Dependence on supplemental oxygen: Secondary | ICD-10-CM | POA: Diagnosis not present

## 2015-06-13 DIAGNOSIS — I482 Chronic atrial fibrillation: Secondary | ICD-10-CM | POA: Diagnosis present

## 2015-06-13 DIAGNOSIS — I10 Essential (primary) hypertension: Secondary | ICD-10-CM | POA: Diagnosis not present

## 2015-06-13 DIAGNOSIS — Z79899 Other long term (current) drug therapy: Secondary | ICD-10-CM | POA: Diagnosis not present

## 2015-06-13 DIAGNOSIS — J9601 Acute respiratory failure with hypoxia: Secondary | ICD-10-CM | POA: Diagnosis present

## 2015-06-13 DIAGNOSIS — F329 Major depressive disorder, single episode, unspecified: Secondary | ICD-10-CM | POA: Diagnosis present

## 2015-06-13 DIAGNOSIS — I5033 Acute on chronic diastolic (congestive) heart failure: Secondary | ICD-10-CM | POA: Diagnosis present

## 2015-06-13 DIAGNOSIS — Z950 Presence of cardiac pacemaker: Secondary | ICD-10-CM

## 2015-06-13 DIAGNOSIS — E876 Hypokalemia: Secondary | ICD-10-CM | POA: Diagnosis present

## 2015-06-13 DIAGNOSIS — J189 Pneumonia, unspecified organism: Secondary | ICD-10-CM | POA: Diagnosis present

## 2015-06-13 DIAGNOSIS — Z880 Allergy status to penicillin: Secondary | ICD-10-CM

## 2015-06-13 DIAGNOSIS — I2510 Atherosclerotic heart disease of native coronary artery without angina pectoris: Secondary | ICD-10-CM | POA: Diagnosis present

## 2015-06-13 DIAGNOSIS — E785 Hyperlipidemia, unspecified: Secondary | ICD-10-CM | POA: Diagnosis present

## 2015-06-13 DIAGNOSIS — J9620 Acute and chronic respiratory failure, unspecified whether with hypoxia or hypercapnia: Secondary | ICD-10-CM | POA: Insufficient documentation

## 2015-06-13 DIAGNOSIS — R0902 Hypoxemia: Secondary | ICD-10-CM

## 2015-06-13 DIAGNOSIS — I5032 Chronic diastolic (congestive) heart failure: Secondary | ICD-10-CM

## 2015-06-13 LAB — TROPONIN I

## 2015-06-13 LAB — CBC WITH DIFFERENTIAL/PLATELET
Basophils Absolute: 0.1 10*3/uL (ref 0–0.1)
EOS ABS: 0.2 10*3/uL (ref 0–0.7)
HCT: 30.3 % — ABNORMAL LOW (ref 35.0–47.0)
Hemoglobin: 10.6 g/dL — ABNORMAL LOW (ref 12.0–16.0)
LYMPHS ABS: 0.4 10*3/uL — AB (ref 1.0–3.6)
Lymphocytes Relative: 5 %
MCH: 31.7 pg (ref 26.0–34.0)
MCHC: 34.9 g/dL (ref 32.0–36.0)
MCV: 91 fL (ref 80.0–100.0)
Monocytes Absolute: 0.7 10*3/uL (ref 0.2–0.9)
Neutro Abs: 6.4 10*3/uL (ref 1.4–6.5)
PLATELETS: 208 10*3/uL (ref 150–440)
RBC: 3.33 MIL/uL — AB (ref 3.80–5.20)
RDW: 15.5 % — ABNORMAL HIGH (ref 11.5–14.5)
WBC: 7.8 10*3/uL (ref 3.6–11.0)

## 2015-06-13 LAB — BASIC METABOLIC PANEL
Anion gap: 6 (ref 5–15)
BUN: 23 mg/dL — AB (ref 6–20)
CO2: 22 mmol/L (ref 22–32)
CREATININE: 1.23 mg/dL — AB (ref 0.44–1.00)
Calcium: 8.4 mg/dL — ABNORMAL LOW (ref 8.9–10.3)
Chloride: 107 mmol/L (ref 101–111)
GFR, EST AFRICAN AMERICAN: 43 mL/min — AB (ref >60)
GFR, EST NON AFRICAN AMERICAN: 37 mL/min — AB (ref >60)
Glucose, Bld: 158 mg/dL — ABNORMAL HIGH (ref 65–99)
Potassium: 3.9 mmol/L (ref 3.5–5.1)
SODIUM: 135 mmol/L (ref 135–145)

## 2015-06-13 LAB — MRSA PCR SCREENING: MRSA BY PCR: NEGATIVE

## 2015-06-13 LAB — BRAIN NATRIURETIC PEPTIDE: B NATRIURETIC PEPTIDE 5: 976 pg/mL — AB (ref 0.0–100.0)

## 2015-06-13 LAB — PROTIME-INR
INR: 2.44
PROTHROMBIN TIME: 26.2 s — AB (ref 11.4–15.0)

## 2015-06-13 MED ORDER — WARFARIN SODIUM 5 MG PO TABS: 5.0000 mg | ORAL_TABLET | Freq: Every day | ORAL | Status: DC

## 2015-06-13 MED ORDER — SODIUM CHLORIDE 0.9 % IV SOLN: 250.0000 mL | INTRAVENOUS | Status: DC | PRN

## 2015-06-13 MED ORDER — FUROSEMIDE 40 MG PO TABS
40.0000 mg | ORAL_TABLET | Freq: Two times a day (BID) | ORAL | Status: DC
  Administered 2015-06-14 – 2015-06-15 (×3): 40 mg via ORAL
  Filled 2015-06-13 (×3): qty 1

## 2015-06-13 MED ORDER — IPRATROPIUM-ALBUTEROL 0.5-2.5 (3) MG/3ML IN SOLN
3.0000 mL | Freq: Four times a day (QID) | RESPIRATORY_TRACT | Status: DC
  Administered 2015-06-14 (×3): 3 mL via RESPIRATORY_TRACT
  Filled 2015-06-13 (×3): qty 3

## 2015-06-13 MED ORDER — ALBUTEROL SULFATE (2.5 MG/3ML) 0.083% IN NEBU
2.5000 mg | INHALATION_SOLUTION | Freq: Four times a day (QID) | RESPIRATORY_TRACT | Status: DC | PRN
  Administered 2015-06-13: 2.5 mg via RESPIRATORY_TRACT
  Filled 2015-06-13: qty 3

## 2015-06-13 MED ORDER — LEVOFLOXACIN IN D5W 750 MG/150ML IV SOLN
750.0000 mg | Freq: Once | INTRAVENOUS | Status: AC
  Administered 2015-06-13: 750 mg via INTRAVENOUS
  Filled 2015-06-13: qty 150

## 2015-06-13 MED ORDER — ACETAMINOPHEN 325 MG PO TABS
650.0000 mg | ORAL_TABLET | Freq: Four times a day (QID) | ORAL | Status: DC | PRN
  Administered 2015-06-14: 650 mg via ORAL
  Filled 2015-06-13: qty 2

## 2015-06-13 MED ORDER — DIPHENHYDRAMINE HCL 25 MG PO CAPS
50.0000 mg | ORAL_CAPSULE | Freq: Every evening | ORAL | Status: DC | PRN
  Administered 2015-06-13 – 2015-06-16 (×3): 50 mg via ORAL
  Filled 2015-06-13 (×3): qty 2

## 2015-06-13 MED ORDER — LEVOTHYROXINE SODIUM 112 MCG PO TABS
112.0000 ug | ORAL_TABLET | Freq: Every day | ORAL | Status: DC
  Administered 2015-06-14 – 2015-06-17 (×4): 112 ug via ORAL
  Filled 2015-06-13 (×5): qty 1

## 2015-06-13 MED ORDER — ENSURE ENLIVE PO LIQD
237.0000 mL | Freq: Two times a day (BID) | ORAL | Status: DC
  Administered 2015-06-14 – 2015-06-17 (×6): 237 mL via ORAL

## 2015-06-13 MED ORDER — POLYETHYLENE GLYCOL 3350 17 G PO PACK
17.0000 g | PACK | Freq: Every day | ORAL | Status: DC
  Administered 2015-06-13 – 2015-06-16 (×4): 17 g via ORAL
  Filled 2015-06-13 (×4): qty 1

## 2015-06-13 MED ORDER — ONDANSETRON HCL 4 MG PO TABS: 4.0000 mg | ORAL_TABLET | Freq: Four times a day (QID) | ORAL | Status: DC | PRN

## 2015-06-13 MED ORDER — SODIUM CHLORIDE 0.9% FLUSH
3.0000 mL | Freq: Two times a day (BID) | INTRAVENOUS | Status: DC
  Administered 2015-06-13 – 2015-06-16 (×6): 3 mL via INTRAVENOUS

## 2015-06-13 MED ORDER — AZITHROMYCIN 500 MG IV SOLR
500.0000 mg | INTRAVENOUS | Status: DC
  Administered 2015-06-14: 500 mg via INTRAVENOUS
  Filled 2015-06-13 (×2): qty 500

## 2015-06-13 MED ORDER — TRAMADOL HCL 50 MG PO TABS
50.0000 mg | ORAL_TABLET | Freq: Three times a day (TID) | ORAL | Status: DC | PRN
  Administered 2015-06-14 – 2015-06-16 (×2): 50 mg via ORAL
  Filled 2015-06-13 (×3): qty 1

## 2015-06-13 MED ORDER — SODIUM CHLORIDE 0.9% FLUSH: 3.0000 mL | INTRAVENOUS | Status: DC | PRN

## 2015-06-13 MED ORDER — LATANOPROST 0.005 % OP SOLN
1.0000 [drp] | Freq: Every day | OPHTHALMIC | Status: DC
Start: 2015-06-13 — End: 2015-06-17
  Administered 2015-06-13 – 2015-06-16 (×4): 1 [drp] via OPHTHALMIC
  Filled 2015-06-13: qty 2.5

## 2015-06-13 MED ORDER — OCUVITE-LUTEIN PO CAPS
1.0000 | ORAL_CAPSULE | Freq: Every day | ORAL | Status: DC
  Administered 2015-06-13 – 2015-06-17 (×5): 1 via ORAL
  Filled 2015-06-13 (×5): qty 1

## 2015-06-13 MED ORDER — DOCUSATE SODIUM 100 MG PO CAPS
100.0000 mg | ORAL_CAPSULE | Freq: Two times a day (BID) | ORAL | Status: DC
  Administered 2015-06-13 – 2015-06-17 (×8): 100 mg via ORAL
  Filled 2015-06-13 (×8): qty 1

## 2015-06-13 MED ORDER — LEVOFLOXACIN IN D5W 250 MG/50ML IV SOLN: 250.0000 mg | INTRAVENOUS | Status: DC

## 2015-06-13 MED ORDER — DEXTROSE 5 % IV SOLN
1.0000 g | INTRAVENOUS | Status: DC
  Administered 2015-06-13 – 2015-06-16 (×4): 1 g via INTRAVENOUS
  Filled 2015-06-13 (×5): qty 10

## 2015-06-13 MED ORDER — ONDANSETRON HCL 4 MG/2ML IJ SOLN
4.0000 mg | Freq: Four times a day (QID) | INTRAMUSCULAR | Status: DC | PRN
  Administered 2015-06-15: 4 mg via INTRAVENOUS
  Filled 2015-06-13: qty 2

## 2015-06-13 MED ORDER — WARFARIN SODIUM 5 MG PO TABS
5.0000 mg | ORAL_TABLET | Freq: Every day | ORAL | Status: DC
  Administered 2015-06-13 – 2015-06-14 (×2): 5 mg via ORAL
  Filled 2015-06-13 (×2): qty 1

## 2015-06-13 MED ORDER — ATENOLOL 25 MG PO TABS
25.0000 mg | ORAL_TABLET | Freq: Two times a day (BID) | ORAL | Status: DC
  Administered 2015-06-13 – 2015-06-17 (×8): 25 mg via ORAL
  Filled 2015-06-13 (×8): qty 1

## 2015-06-13 MED ORDER — FUROSEMIDE 10 MG/ML IJ SOLN
40.0000 mg | Freq: Once | INTRAMUSCULAR | Status: AC
  Administered 2015-06-13: 40 mg via INTRAVENOUS
  Filled 2015-06-13: qty 4

## 2015-06-13 MED ORDER — ACETAMINOPHEN 650 MG RE SUPP: 650.0000 mg | Freq: Four times a day (QID) | RECTAL | Status: DC | PRN

## 2015-06-13 MED ORDER — HYDROCODONE-ACETAMINOPHEN 10-325 MG PO TABS
1.0000 | ORAL_TABLET | Freq: Three times a day (TID) | ORAL | Status: DC | PRN
  Administered 2015-06-13: 1 via ORAL
  Filled 2015-06-13: qty 1

## 2015-06-13 MED ORDER — POTASSIUM CHLORIDE CRYS ER 10 MEQ PO TBCR
10.0000 meq | EXTENDED_RELEASE_TABLET | Freq: Every day | ORAL | Status: DC
  Administered 2015-06-13 – 2015-06-17 (×5): 10 meq via ORAL
  Filled 2015-06-13 (×7): qty 1

## 2015-06-13 MED ORDER — ATORVASTATIN CALCIUM 20 MG PO TABS
20.0000 mg | ORAL_TABLET | Freq: Every day | ORAL | Status: DC
  Administered 2015-06-13 – 2015-06-16 (×4): 20 mg via ORAL
  Filled 2015-06-13 (×4): qty 1

## 2015-06-13 MED ORDER — ALBUTEROL SULFATE (2.5 MG/3ML) 0.083% IN NEBU
2.5000 mg | INHALATION_SOLUTION | Freq: Once | RESPIRATORY_TRACT | Status: AC
  Administered 2015-06-13: 2.5 mg via RESPIRATORY_TRACT
  Filled 2015-06-13: qty 3

## 2015-06-13 MED ORDER — DORZOLAMIDE HCL-TIMOLOL MAL 2-0.5 % OP SOLN
1.0000 [drp] | Freq: Two times a day (BID) | OPHTHALMIC | Status: DC
  Administered 2015-06-13 – 2015-06-17 (×8): 1 [drp] via OPHTHALMIC
  Filled 2015-06-13: qty 10

## 2015-06-13 MED ORDER — DILTIAZEM HCL ER COATED BEADS 180 MG PO CP24
180.0000 mg | ORAL_CAPSULE | Freq: Every day | ORAL | Status: DC
  Administered 2015-06-14 – 2015-06-17 (×4): 180 mg via ORAL
  Filled 2015-06-13 (×4): qty 1

## 2015-06-13 NOTE — Patient Instructions (Signed)
TO ER.  

## 2015-06-13 NOTE — Progress Notes (Signed)
Subjective:    Patient ID: [name], female    DOB: 1923-10-19, 82 y.o.   MRN: [id number]   CC: "I can't breathe"  HPI  82YO female presents for follow up.  Last two weeks, feeling more and more short of breath. Gasping at night. More swelling in legs. Reports compliance with medication. Trying to avoid salt. Chest feels "heavy" especially at night. Described as brick on chest. Few episodes of heart racing. Has cardiology appt tomorrow.   Wt Readings from Last 3 Encounters:  06/13/15 106 lb 12.8 oz (48.444 kg)  Jun 21, 2015 95 lb (43.092 kg)  05/14/15 95 lb (43.092 kg)   BP Readings from Last 3 Encounters:  21-Jun-2015 120/60  05/14/15 118/72  05/09/15 107/69    Past Medical History  Diagnosis Date  . Permanent atrial fibrillation (HCC)     a. Chronic Coumadin.  [name] HTN (hypertension)   . Chronic diastolic CHF (congestive heart failure), NYHA class 1 (HCC)     a. 04/2013 Echo: EF 50-55%, triv MR, mildly dil LA, PASP , mild TR, mild-mod RA dil.  [name] CAD (coronary artery disease)     a. 2005 Cath: 100% dLCX-->Med managed.  . Sick sinus syndrome (HCC)     a. 05/2008 s/p MDT EnRhythm DC PPM ([facility]).  . Chronic pain     a. ? due to arthritis  . Glaucoma   . Hyperlipidemia   . Diverticulitis large intestine   . Allergy   . Phlebitis     a. after pacemeker placement in 2010.  [name] Colon polyps   . Hypothyroidism   . Transfusion history     a. After childbirth 60 years ago (1957)  . Rash/skin eruption     a. Multiple episodes - wide distribution-intermittent, possibly drug rash.  . Pulmonary HTN (HCC)     a. PASP on echo 04/2013.  [name] Chronic Right Pleural Effusion     a. 07/2012 s/p thoracentesis - Followed by Dr. Delford Field [facility].  . Arthritis   . Bronchitis   . Hay fever   . Colon polyps   . History of blood transfusion   . Urine incontinence   . Back complaints   . Acalculous cholecystitis   . CHF (congestive heart failure) (HCC)    Family  History  Problem Relation Age of Onset  . Cancer Mother     colon with mets  . Diabetes Mother   . Heart disease Father   . Hyperlipidemia Father   . Hypertension Father   . Heart attack Father   . Stroke Brother     brother who died Jun 21, 2022  . Cancer Paternal Aunt     breast  . Cancer Cousin     uterine, lung cancer  . Heart attack Brother    Past Surgical History  Procedure Laterality Date  . Appendectomy  1939  . Abdominal hysterectomy  1977  . Tonsillectomy  82 yrs old  . Kidney surgery  1955    Right  . Ankle fracture surgery  1979    Left, pin  . Pacemaker insertion  06/20/08    MDT EnRhythm DR implanted by Dr Reyes Ivan  . Breast surgery      Needle guided excision, right breast calcification  . Breast fibroadenoma surgery  2010    Right  . Skin graft      left leg  . Breast biopsy  2011   Social History   Social History  . Marital Status:  Married    Spouse Name: Lorella Nimrod  . Number of Children: 3  . Years of Education: 13   Occupational History  . RETIRED    Social History Main Topics  . Smoking status: Never Smoker   . Smokeless tobacco: Never Used  . Alcohol Use: No  . Drug Use: No  . Sexual Activity: No   Other Topics Concern  . None   Social History Narrative   Lives with husband in [place]. Three sons.      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     Married 1948.    3 sons - Jun 10, 2050, 2055-06-10, Jun 09, 2056; 5 grandchildren-one deceased -  OD @ 59.        End of Life Care: no prolonged heroic measures, i.e. Artificial feeding or hydration; DNR; no prolonged intubation.    Review of Systems  Constitutional: Positive for fatigue. Negative for fever, chills, appetite change and unexpected weight change.  Eyes: Negative for visual disturbance.  Respiratory: Positive for chest tightness and shortness of breath. Negative for wheezing.   Cardiovascular: Positive for chest pain. Negative for  palpitations and leg swelling.  Gastrointestinal: Negative for abdominal pain.  Skin: Negative for color change and rash.  Neurological: Positive for weakness.  Hematological: Negative for adenopathy. Does not bruise/bleed easily.  Psychiatric/Behavioral: Positive for sleep disturbance. Negative for dysphoric mood. The patient is not nervous/anxious.        Objective:   unable to auscultate BP Pulse 100  Ht  (1.6 m)  Wt 106 lb 12.8 oz (48.444 kg)  BMI 18.92 kg/m2  SpO2 80% Physical Exam  Constitutional: She is oriented to person, place, and time. She appears well-developed and well-nourished. No distress.  HENT:  Head: Normocephalic and atraumatic.  Right Ear: External ear normal.  Left Ear: External ear normal.  Nose: Nose normal.  Mouth/Throat: Oropharynx is clear and moist. No oropharyngeal exudate.  Eyes: Conjunctivae are normal. Pupils are equal, round, and reactive to light. Right eye exhibits no discharge. Left eye exhibits no discharge. No scleral icterus.  Neck: Normal range of motion. Neck supple. No tracheal deviation present. No thyromegaly present.  Cardiovascular: Normal rate, regular rhythm, normal heart sounds and intact distal pulses.  Exam reveals no gallop and no friction rub.   No murmur heard. Pulmonary/Chest: Accessory muscle usage present. No tachypnea. No respiratory distress. She has no decreased breath sounds. She has no wheezes. She has rhonchi. She has rales. She exhibits no tenderness.  Musculoskeletal: Normal range of motion. She exhibits no edema or tenderness.  Lymphadenopathy:    She has no cervical adenopathy.  Neurological: She is alert and oriented to person, place, and time. No cranial nerve deficit. She exhibits normal muscle tone. Coordination normal.  Skin: Skin is warm and dry. No rash noted. She is not diaphoretic. No erythema. No pallor.  Psychiatric: She has a normal mood and affect. Her behavior is normal. Judgment and thought  content normal.          Assessment & Plan:   Problem List Items Addressed This Visit      Unprioritized   Acute on chronic respiratory failure (HCC) - Primary    Acute respiratory distress with chest heaviness noted. Sent to ER via EMS for evaluation. Multiple chronic medical issues including pulm htn, afib, chf. Will need r/o MI. Likely IV diuresis.       Diastolic CHF, chronic (  HCC)   Essential hypertension, benign   Pulmonary HTN (HCC)       No Follow-up on file.  Ronna PolioJennifer Ahniyah Giancola, MD Internal Medicine [facility]

## 2015-06-13 NOTE — H&P (Signed)
[facility] [place] at [facility]   PATIENT NAME: Laurie Larsen    MR#:  [id number]  DATE OF BIRTH:  06/18/1923  DATE OF ADMISSION:  06/13/2015  PRIMARY CARE PHYSICIAN: Wynona DoveWALKER,JENNIFER AZBELL, MD   REQUESTING/REFERRING PHYSICIAN: Dr. Derrill KayGoodman  CHIEF COMPLAINT:   Shortness of breath and chest heaviness and found to be hypoxic at primary care physician's office HISTORY OF PRESENT ILLNESS:  Laurie Larsen  is a 82 y.o. female with a known history of Diastolic congestive heart failure EF of 50-55% with pulmonary hypertension, atrial fibrillation on chronic Coumadin, CAD, glaucoma, hyperlipidemia comes to the emergency room from Dr. Theodoro ParmaJennifer Walker's office after she was found to have short of breath and hypoxic with sats 82% on room air. Patient uses oxygen during nighttime. [facility]. In the emergency room evaluation noted patient has congestive heart failure and developing a left lower lobe early pneumonia. Blood cultures have been drawn should received IV Lasix 40 mg 1 and IV Levaquin. Patient is being admitted for pneumonia and CHF and acute on chronic diastolic.  PAST MEDICAL HISTORY:   Past Medical History  Diagnosis Date  . Permanent atrial fibrillation (HCC)     a. Chronic Coumadin.  [name]. HTN (hypertension)   . Chronic diastolic CHF (congestive heart failure), NYHA class 1 (HCC)     a. 04/2013 Echo: EF 50-55%, triv MR, mildly dil LA, PASP 48mmHg, mild TR, mild-mod RA dil.  [name]. CAD (coronary artery disease)     a. 2005 Cath: 100% dLCX-->Med managed.  . Sick sinus syndrome (HCC)     a. 05/2008 s/p MDT EnRhythm DC PPM ([facility]).  . Chronic pain     a. ? due to arthritis  . Glaucoma   . Hyperlipidemia   . Diverticulitis large intestine   . Allergy   . Phlebitis     a. after pacemeker placement in 2010.  [name]. Colon polyps   . Hypothyroidism   . Transfusion history     a. After childbirth 60 years ago  (1957)  . Rash/skin eruption     a. Multiple episodes - wide distribution-intermittent, possibly drug rash.  . Pulmonary HTN (HCC)     a. PASP 48mmHg on echo 04/2013.  [name]. Chronic Right Pleural Effusion     a. 07/2012 s/p thoracentesis - [facility]  . Arthritis   . Bronchitis   . Hay fever   . Colon polyps   . History of blood transfusion   . Urine incontinence   . Back complaints   . Acalculous cholecystitis   . CHF (congestive heart failure) (HCC)     PAST SURGICAL HISTOIRY:   Past Surgical History  Procedure Laterality Date  . Appendectomy  1939  . Abdominal hysterectomy  1977  . Tonsillectomy  82 yrs old  . Kidney surgery  1955    Right  . Ankle fracture surgery  1979    Left, pin  . Pacemaker insertion  06/20/08    MDT EnRhythm DR implanted by Dr Reyes IvanKersey  . Breast surgery      Needle guided excision, right breast calcification  . Breast fibroadenoma surgery  2010    Right  . Skin graft      left leg  . Breast biopsy  2011    SOCIAL HISTORY:   Social History  Substance Use Topics  . Smoking status: Never Smoker   . Smokeless tobacco: Never  Used  . Alcohol Use: No    FAMILY HISTORY:   Family History  Problem Relation Age of Onset  . Cancer Mother     colon with mets  . Diabetes Mother   . Heart disease Father   . Hyperlipidemia Father   . Hypertension Father   . Heart attack Father   . Stroke Brother     brother who died 2022-06-29  . Cancer Paternal Aunt     breast  . Cancer Cousin     uterine, lung cancer  . Heart attack Brother     DRUG ALLERGIES:   Allergies  Allergen Reactions  . Amoxicillin Other (See Comments)    Reaction:  Weakness Has patient had a PCN reaction causing immediate rash, facial/tongue/throat swelling, SOB or lightheadedness with hypotension: No Has patient had a PCN reaction causing severe rash involving mucus membranes or skin necrosis: No Has patient had a PCN reaction that required  hospitalization No Has patient had a PCN reaction occurring within the last 10 years: No If all of the above answers are "NO", then may proceed with Cephalosporin use.  . Cetylpyridinium Other (See Comments)    Reaction:  Weakness   . Digoxin Other (See Comments)    Reaction:  Weakness and dehydration  . Risedronate Sodium Rash    REVIEW OF SYSTEMS:  Review of Systems  Constitutional: Negative for fever, chills and weight loss.  HENT: Negative for ear discharge, ear pain and nosebleeds.   Eyes: Negative for blurred vision, pain and discharge.  Respiratory: Positive for cough and shortness of breath. Negative for sputum production, wheezing and stridor.   Cardiovascular: Negative for chest pain, palpitations, orthopnea and PND.  Gastrointestinal: Negative for nausea, vomiting, abdominal pain and diarrhea.  Genitourinary: Negative for urgency and frequency.  Musculoskeletal: Negative for back pain and joint pain.  Neurological: Positive for weakness. Negative for sensory change, speech change and focal weakness.  Psychiatric/Behavioral: Negative for depression and hallucinations. The patient is not nervous/anxious.      MEDICATIONS AT HOME:   Prior to Admission medications   Medication Sig Start Date End Date Taking? Authorizing Provider  acetaminophen (TYLENOL) 650 MG CR tablet Take 1,300 mg by mouth at bedtime as needed for pain.   Yes Historical Provider, MD  atenolol (TENORMIN) 25 MG tablet Take 1 tablet (25 mg total) by mouth 2 (two) times daily. 10/03/14  Yes Peter M [place], MD  atorvastatin (LIPITOR) 20 MG tablet Take 20 mg by mouth at bedtime.   Yes Historical Provider, MD  diltiazem (CARDIZEM CD) 180 MG 24 hr capsule Take 180 mg by mouth daily.   Yes Historical Provider, MD  docusate sodium (COLACE) 100 MG capsule Take 1 capsule (100 mg total) by mouth 2 (two) times daily. 03/29/15  Yes Ramonita Lab, MD  dorzolamide-timolol (COSOPT) 22.3-6.8 MG/ML ophthalmic solution Place 1 drop  into both eyes 2 (two) times daily.    Yes Historical Provider, MD  feeding supplement, ENSURE ENLIVE, (ENSURE ENLIVE) LIQD Take 237 mLs by mouth 2 (two) times daily with a meal. 03/29/15  Yes Ramonita Lab, MD  furosemide (LASIX) 40 MG tablet Take 1 tablet (40 mg total) by mouth 2 (two) times daily. 03/29/15  Yes Ramonita Lab, MD  HYDROcodone-acetaminophen (NORCO) 10-325 MG tablet Take 1 tablet by mouth 3 (three) times daily as needed for moderate pain. 05/09/15  Yes Shelia Media, MD  ipratropium-albuterol (DUONEB) 0.5-2.5 (3) MG/3ML SOLN Take 3 mLs by nebulization every 6 (six) hours. 03/29/15  Yes Ramonita Lab, MD  levothyroxine (SYNTHROID, LEVOTHROID) 112 MCG tablet Take 1 tablet (112 mcg total) by mouth daily. 09/06/14  Yes Tommie Sams, DO  polyethylene glycol (MIRALAX / GLYCOLAX) packet Take 17 g by mouth at bedtime.    Yes Historical Provider, MD  potassium chloride (K-DUR) 10 MEQ tablet Take 1 tablet (10 mEq total) by mouth daily. 05/14/15  Yes Minna Antis, MD  traMADol (ULTRAM) 50 MG tablet Take 50 mg by mouth 3 (three) times daily as needed for moderate pain.   Yes Historical Provider, MD  Travoprost, BAK Free, (TRAVATAN) 0.004 % SOLN ophthalmic solution Place 1 drop into both eyes at bedtime.   Yes Historical Provider, MD  Vitamin D, Ergocalciferol, (DRISDOL) 50000 UNITS CAPS capsule Take 50,000 Units by mouth every 7 (seven) days. Pt takes on Saturday.   Yes Historical Provider, MD  warfarin (COUMADIN) 5 MG tablet Take 5 mg by mouth daily at 6 PM.   Yes Historical Provider, MD      VITAL SIGNS:  SpO2 94 %.  PHYSICAL EXAMINATION:  GENERAL:  82 y.o.-year-old patient lying in the bed with no acute distress. Thin EYES: Pupils equal, round, reactive to light and accommodation. No scleral icterus. Extraocular muscles intact.  HEENT: Head atraumatic, normocephalic. Oropharynx and nasopharynx clear.  NECK:  Supple, no jugular venous distention. No thyroid enlargement, no tenderness.  LUNGS  coarse breath sounds bilaterally, no wheezing, rales,rhonchi or crepitation. No use of accessory muscles of respiration.  CARDIOVASCULAR: S1, S2 normal. No murmurs, rubs, or gallops.  ABDOMEN: Soft, nontender, nondistended. Bowel sounds present. No organomegaly or mass.  EXTREMITIES: No pedal edema, cyanosis, or clubbing.  NEUROLOGIC: Cranial nerves II through XII are intact. Muscle strength 5/5 in all extremities. Sensation intact. Gait not checked.  PSYCHIATRIC: The patient is alert and oriented x 3.  SKIN: No obvious rash, lesion, or ulcer.   LABORATORY PANEL:   CBC  Recent Labs Lab 06/13/15 1445  WBC 7.8  HGB 10.6*  HCT 30.3*  PLT 208   ------------------------------------------------------------------------------------------------------------------  Chemistries   Recent Labs Lab 06/13/15 1445  NA 135  K 3.9  CL 107  CO2 22  GLUCOSE 158*  BUN 23*  CREATININE 1.23*  CALCIUM 8.4*   ------------------------------------------------------------------------------------------------------------------  Cardiac Enzymes  Recent Labs Lab 06/13/15 1445  TROPONINI <0.03   ------------------------------------------------------------------------------------------------------------------  RADIOLOGY:  Dg Chest 2 View  06/13/2015  CLINICAL DATA:  Shortness of Breath EXAM: CHEST  2 VIEW COMPARISON:  05/14/2015 FINDINGS: Cardiac shadow is enlarged but stable. A pacing device is again seen. Chronic changes in the right lung base are again noted. Some slight increased density in the left lung base is noted consistent with early infiltrate. Mild right midlung atelectasis is noted as well. IMPRESSION: Left basilar infiltrate with associated small effusion. Chronic changes in the right base. Electronically Signed   By: Alcide Clever M.D.   On: 06/13/2015 16:29    EKG:   Chronic A. fib IMPRESSION AND PLAN:  Laurie Larsen  is a 82 y.o. female with a known history of Diastolic  congestive heart failure EF of 50-55% with pulmonary hypertension, atrial fibrillation on chronic Coumadin, CAD, glaucoma, hyperlipidemia comes to the emergency room from Dr. Theodoro Parma office after she was found to have short of breath and hypoxic with sats 82% on room air.  1.Acute hypoxic respiratory failure secondary to left lower lobe pneumonia early infiltrate and congestive heart failure acute on chronic diastolic -Treatment outlined as below  2. Left lower  lobe pneumonia -Follow blood culture, CBC -IV Levaquin  3. Acute on chronic diastolic congestive heart failure -Echo done in the past has EF of 55-60% with moderate TR and pulmonary hypertension moderate -IV Lasix 40 mg given in the emergency room. -We'll resume by mouth Lasix 40 mg twice a day home dose and if needed give extra IV dose of Lasix depending on creatinine -Monitor I's and O's  4. Glaucoma continue eyedrops  5. Chronic atrial fibrillation Continue diltiazem  -Continue warfarin. Pharmacy to dose   6. Hypothyroidism on Synthroid   7. Hypertension continue atenolol and diltiazem   8. DVT prophylaxis patient already on Coumadin with therapeutic INR  All the records are reviewed and case discussed with ED provider. Management plans discussed with the patient, family and they are in agreement.  CODE STATUS: DO NOT RESUSCITATE this was confirmed with patient's son Laurie Larsen  TOTAL TIME TAKING CARE OF THIS PATIENT: 50 mins.    Sadiya Durand M.D on 06/13/2015 at 5:29 PM  Between 7am to 6pm - Pager - [phone]  After 6pm go to [email or web address] - password [email or web address] [facility]  [facility]  Office  [phone]  CC: Primary care physician; Wynona Dove, MD

## 2015-06-13 NOTE — Progress Notes (Signed)
ANTIBIOTIC CONSULT NOTE - INITIAL  Pharmacy Consult for Ceftriaxone  Indication: pneumonia  Allergies  Allergen Reactions  . Amoxicillin Other (See Comments)    Reaction:  Weakness Has patient had a PCN reaction causing immediate rash, facial/tongue/throat swelling, SOB or lightheadedness with hypotension: No Has patient had a PCN reaction causing severe rash involving mucus membranes or skin necrosis: No Has patient had a PCN reaction that required hospitalization No Has patient had a PCN reaction occurring within the last 10 years: No If all of the above answers are "NO", then may proceed with Cephalosporin use.  . Cetylpyridinium Other (See Comments)    Reaction:  Weakness   . Digoxin Other (See Comments)    Reaction:  Weakness and dehydration  . Risedronate Sodium Rash    Patient Measurements:   Adjusted Body Weight:   Vital Signs: Temp: 97.6 F (36.4 C) (05/17 2029) Temp Source: Oral (05/17 2029) BP: 131/83 mmHg (05/17 2029) Pulse Rate: 91 (05/17 2029) Intake/Output from previous day:   Intake/Output from this shift:    Labs:  Recent Labs  06/13/15 1445  WBC 7.8  HGB 10.6*  PLT 208  CREATININE 1.23*   Estimated Creatinine Clearance: 23.2 mL/min (by C-G formula based on Cr of 1.23). No results for input(s): VANCOTROUGH, VANCOPEAK, VANCORANDOM, GENTTROUGH, GENTPEAK, GENTRANDOM, TOBRATROUGH, TOBRAPEAK, TOBRARND, AMIKACINPEAK, AMIKACINTROU, AMIKACIN in the last 72 hours.   Microbiology: No results found for this or any previous visit (from the past 720 hour(s)).  Medical History: Past Medical History  Diagnosis Date  . Permanent atrial fibrillation (HCC)     a. Chronic Coumadin.  [name] HTN (hypertension)   . Chronic diastolic CHF (congestive heart failure), NYHA class 1 (HCC)     a. 04/2013 Echo: EF 50-55%, triv MR, mildly dil LA, PASP , mild TR, mild-mod RA dil.  [name] CAD (coronary artery disease)     a. 2005 Cath: 100% dLCX-->Med managed.  . Sick sinus  syndrome (HCC)     a. 05/2008 s/p MDT EnRhythm DC PPM ([facility]).  . Chronic pain     a. ? due to arthritis  . Glaucoma   . Hyperlipidemia   . Diverticulitis large intestine   . Allergy   . Phlebitis     a. after pacemeker placement in 2010.  [name] Colon polyps   . Hypothyroidism   . Transfusion history     a. After childbirth 60 years ago (1957)  . Rash/skin eruption     a. Multiple episodes - wide distribution-intermittent, possibly drug rash.  . Pulmonary HTN (HCC)     a. PASP on echo 04/2013.  [name] Chronic Right Pleural Effusion     a. 07/2012 s/p thoracentesis - Followed by Dr. Delford Field [facility].  . Arthritis   . Bronchitis   . Hay fever   . Colon polyps   . History of blood transfusion   . Urine incontinence   . Back complaints   . Acalculous cholecystitis   . CHF (congestive heart failure) (HCC)     Medications:  Prescriptions prior to admission  Medication Sig Dispense Refill Last Dose  . acetaminophen (TYLENOL) 650 MG CR tablet Take 1,300 mg by mouth at bedtime as needed for pain.   PRN at PRN  . atenolol (TENORMIN) 25 MG tablet Take 1 tablet (25 mg total) by mouth 2 (two) times daily. 180 tablet 3 unknown at unknown   . atorvastatin (LIPITOR) 20 MG tablet Take 20 mg by mouth at bedtime.  unknown at unknown   . diltiazem (CARDIZEM CD) 180 MG 24 hr capsule Take 180 mg by mouth daily.   unknown at unknown   . docusate sodium (COLACE) 100 MG capsule Take 1 capsule (100 mg total) by mouth 2 (two) times daily. 10 capsule 0 unknown at unknown   . dorzolamide-timolol (COSOPT) 22.3-6.8 MG/ML ophthalmic solution Place 1 drop into both eyes 2 (two) times daily.    unknown at unknown   . feeding supplement, ENSURE ENLIVE, (ENSURE ENLIVE) LIQD Take 237 mLs by mouth 2 (two) times daily with a meal. 237 mL 12 unknown at unknown   . furosemide (LASIX) 40 MG tablet Take 1 tablet (40 mg total) by mouth 2 (two) times daily. 60 tablet 0 unknown at unknown   .  HYDROcodone-acetaminophen (NORCO) 10-325 MG tablet Take 1 tablet by mouth 3 (three) times daily as needed for moderate pain. 90 tablet 0 PRN at PRN  . ipratropium-albuterol (DUONEB) 0.5-2.5 (3) MG/3ML SOLN Take 3 mLs by nebulization every 6 (six) hours. 360 mL 0 unknown at unknown   . levothyroxine (SYNTHROID, LEVOTHROID) 112 MCG tablet Take 1 tablet (112 mcg total) by mouth daily. 90 tablet 1 unknown at unknown   . polyethylene glycol (MIRALAX / GLYCOLAX) packet Take 17 g by mouth at bedtime.    unknown at unknown   . potassium chloride (K-DUR) 10 MEQ tablet Take 1 tablet (10 mEq total) by mouth daily. 7 tablet 0 unknown at unknown   . traMADol (ULTRAM) 50 MG tablet Take 50 mg by mouth 3 (three) times daily as needed for moderate pain.   PRN at PRN  . Travoprost, BAK Free, (TRAVATAN) 0.004 % SOLN ophthalmic solution Place 1 drop into both eyes at bedtime.   unknown at unknown   . Vitamin D, Ergocalciferol, (DRISDOL) 50000 UNITS CAPS capsule Take 50,000 Units by mouth every 7 (seven) days. Pt takes on Saturday.   unknown at unknown   . warfarin (COUMADIN) 5 MG tablet Take 5 mg by mouth daily at 6 PM.   unknown at unknown    Assessment: CrCl = 23.2 ml/min   Goal of Therapy:  resolution of infection  Plan:  Expected duration 7 days with resolution of temperature and/or normalization of WBC   Ceftriaxone 1 gm IV Q24H ordered to start 5/17 @ 22:00.   Kenyonna Micek D 06/13/2015,9:30 PM

## 2015-06-13 NOTE — Assessment & Plan Note (Signed)
Acute respiratory distress with chest heaviness noted. Sent to ER via EMS for evaluation. Multiple chronic medical issues including pulm htn, afib, chf. Will need r/o MI. Likely IV diuresis.

## 2015-06-13 NOTE — ED Provider Notes (Signed)
[facility] Emergency Department Provider Note   ____________________________________________  Time seen: Seen upon arrival to the emergency department  I have reviewed the triage vital signs and the nursing notes.   HISTORY  Chief Complaint Shortness of Breath   HPI Laurie Larsen is a 82 y.o. female with a history of atrial fibrillation, CHF and CAD who is presenting to the emergency department today from an outpatient clinic for room air saturation of 83%, cough and chest heaviness over the past 2 weeks. She says that the heaviness in her chest goes across her chest and makes it difficult for her to breathe. She denies any fever at home. Is on oxygen only at night when at home. Denies any fever. Brought in by EMS who put her on nasal cannula oxygen with resolution of her saturations to the low to mid 90s.   Past Medical History  Diagnosis Date  . Permanent atrial fibrillation (HCC)     a. Chronic Coumadin.  [name] HTN (hypertension)   . Chronic diastolic CHF (congestive heart failure), NYHA class 1 (HCC)     a. 04/2013 Echo: EF 50-55%, triv MR, mildly dil LA, PASP , mild TR, mild-mod RA dil.  [name] CAD (coronary artery disease)     a. 2005 Cath: 100% dLCX-->Med managed.  . Sick sinus syndrome (HCC)     a. 05/2008 s/p MDT EnRhythm DC PPM ([facility]red).  . Chronic pain     a. ? due to arthritis  . Glaucoma   . Hyperlipidemia   . Diverticulitis large intestine   . Allergy   . Phlebitis     a. after pacemeker placement in 2010.  [name] Colon polyps   . Hypothyroidism   . Transfusion history     a. After childbirth 60 years ago (1957)  . Rash/skin eruption     a. Multiple episodes - wide distribution-intermittent, possibly drug rash.  . Pulmonary HTN (HCC)     a. PASP on echo 04/2013.  [name] Chronic Right Pleural Effusion     a. 07/2012 s/p thoracentesis - Followed by Dr. Delford [facility].  . Arthritis   . Bronchitis   . Hay fever   . Colon  polyps   . History of blood transfusion   . Urine incontinence   . Back complaints   . Acalculous cholecystitis   . CHF (congestive heart failure) [facility])     Patient Active Problem List   Diagnosis Date Noted  . Acute on chronic respiratory failure (HCC) 06/13/2015  . DNR (do not resuscitate) 04/06/2015  . Malnutrition of moderate degree 03/26/2015  . Insomnia 02/20/2015  . Macular degeneration 02/20/2015  . Chronic pain syndrome 02/20/2015  . Screening for breast cancer 02/20/2015  . Cholecystostomy tube dysfunction   . Dyspnea   . Glaucoma 11/22/2014  . Cholecystitis 10/17/2014  . Protein-calorie malnutrition, severe (HCC) 06/01/2014  . CHF (congestive heart failure), NYHA class I (HCC) 05/31/2014  . Cardiac pacemaker in situ 03/10/2014  . HLD (hyperlipidemia) 03/10/2014  . Pulmonary HTN (HCC) 06/09/2013  . Nocturnal hypoxemia 04/20/2013  . Depression 06/23/2011  . Diastolic CHF, chronic (HCC) 11/13/2010  . Recurrent pleural effusion on right 10/01/2010  . Chronic anticoagulation 07/10/2010  . Hypothyroidism 04/24/2010  . Essential hypertension, benign 03/01/2010  . ATRIAL FIBRILLATION, CHRONIC 03/01/2010    Past Surgical History  Procedure Laterality Date  . Appendectomy  1939  . Abdominal hysterectomy  1977  . Tonsillectomy  82 yrs old  .  Kidney surgery  1955    Right  . Ankle fracture surgery  1979    Left, pin  . Pacemaker insertion  06/20/08    MDT EnRhythm DR implanted by Dr Reyes IvanKersey  . Breast surgery      Needle guided excision, right breast calcification  . Breast fibroadenoma surgery  2010    Right  . Skin graft      left leg  . Breast biopsy  2011    Current Outpatient Rx  Name  Route  Sig  Dispense  Refill  . acetaminophen (TYLENOL) 650 MG CR tablet   Oral   Take 1,300 mg by mouth at bedtime as needed for pain.         [name]. atenolol (TENORMIN) 25 MG tablet   Oral   Take 1 tablet (25 mg total) by mouth 2 (two) times daily.   180 tablet   3     . atorvastatin (LIPITOR) 40 MG tablet   Oral   Take 0.5 tablets (20 mg total) by mouth at bedtime.   45 tablet   3   . diltiazem (CARDIZEM CD) 180 MG 24 hr capsule      TAKE ONE CAPSULE DAILY   90 capsule   3   . docusate sodium (COLACE) 100 MG capsule   Oral   Take 1 capsule (100 mg total) by mouth 2 (two) times daily.   10 capsule   0   . dorzolamide-timolol (COSOPT) 22.3-6.8 MG/ML ophthalmic solution   Both Eyes   Place 1 drop into both eyes 2 (two) times daily.          . feeding supplement, ENSURE ENLIVE, (ENSURE ENLIVE) LIQD   Oral   Take 237 mLs by mouth 2 (two) times daily with a meal.   237 mL   12   . furosemide (LASIX) 40 MG tablet   Oral   Take 1 tablet (40 mg total) by mouth 2 (two) times daily.   60 tablet   0   . HYDROcodone-acetaminophen (NORCO) 10-325 MG tablet   Oral   Take 1 tablet by mouth 3 (three) times daily as needed for moderate pain.   90 tablet   0     [facility] Ethelene Halamos.  ...   . ipratropium-albuterol (DUONEB) 0.5-2.5 (3) MG/3ML SOLN   Nebulization   Take 3 mLs by nebulization every 6 (six) hours.   360 mL   0   . levothyroxine (SYNTHROID, LEVOTHROID) 112 MCG tablet   Oral   Take 1 tablet (112 mcg total) by mouth daily.   90 tablet   1   . OXYGEN   Inhalation   Inhale 2 L/min into the lungs at bedtime.         . polyethylene glycol (MIRALAX / GLYCOLAX) packet   Oral   Take 17 g by mouth at bedtime. Reported on 05/01/2015         . potassium chloride (K-DUR) 10 MEQ tablet   Oral   Take 1 tablet (10 mEq total) by mouth daily.   7 tablet   0   . traMADol (ULTRAM) 50 MG tablet   Oral   Take 1 tablet (50 mg total) by mouth 3 (three) times daily as needed.   90 tablet   3   . Travoprost, BAK Free, (TRAVATAN) 0.004 % SOLN ophthalmic solution   Both Eyes   Place 1 drop into both eyes at bedtime.         .[name]  Vitamin D, Ergocalciferol, (DRISDOL) 50000 UNITS CAPS capsule   Oral   Take  50,000 Units by mouth every 7 (seven) days. Pt takes on Saturday.         . warfarin (COUMADIN) 5 MG tablet   Oral   Take 1 tablet (5 mg total) by mouth daily at 6 PM. Take 1 tablet by mouth daily or as directed by coumadin clinic   90 tablet   1     Allergies Amoxicillin; Cetylpyridinium; Digoxin; and Risedronate sodium  Family History  Problem Relation Age of Onset  . Cancer Mother     colon with mets  . Diabetes Mother   . Heart disease Father   . Hyperlipidemia Father   . Hypertension Father   . Heart attack Father   . Stroke Brother     brother who died 07/05/22  . Cancer Paternal Aunt     breast  . Cancer Cousin     uterine, lung cancer  . Heart attack Brother     Social History Social History  Substance Use Topics  . Smoking status: Never Smoker   . Smokeless tobacco: Never Used  . Alcohol Use: No    Review of Systems Constitutional: No fever/chills Eyes: No visual changes. ENT: No sore throat. Cardiovascular: As above Respiratory: As above Gastrointestinal: No abdominal pain.  No nausea, no vomiting.  No diarrhea.  No constipation. Genitourinary: Negative for dysuria. Musculoskeletal: Negative for back pain. Skin: Negative for rash. Neurological: Negative for headaches, focal weakness or numbness.  10-point ROS otherwise negative.  ____________________________________________   PHYSICAL EXAM:  VITAL SIGNS: ED Triage Vitals  Enc Vitals Group     BP --      Pulse --      Resp --      Temp --      Temp src --      SpO2 06/13/15 1426 94 %     Weight --      Height --      Head Cir --      Peak Flow --      Pain Score 06/13/15 1426 0     Pain Loc --      Pain Edu? --      Excl. in GC? --     Constitutional: Alert and oriented. Labored respirations Eyes: Conjunctivae are normal. PERRL. EOMI. Head: Atraumatic. Nose: No congestion/rhinnorhea. Mouth/Throat: Mucous membranes are moist.   Neck: No stridor.   Cardiovascular: Normal rate,  regular Laurie. Grossly normal heart sounds.  Respiratory:  Labored respirations with mild supraclavicular retractions. Decrease lung sounds to the lower fields with rales to both bases. Gastrointestinal: Soft and nontender. No distention. No CVA tenderness. Musculoskeletal: No lower extremity tenderness nor edema.  No joint effusions. Neurologic:  Normal speech and language. No gross focal neurologic deficits are appreciated.  Skin:  Skin is warm, dry and intact. No rash noted. Psychiatric: Mood and affect are normal. Speech and behavior are normal.  ____________________________________________   LABS (all labs ordered are listed, but only abnormal results are displayed)  Labs Reviewed  CBC WITH DIFFERENTIAL/PLATELET  BASIC METABOLIC PANEL  TROPONIN I  BRAIN NATRIURETIC PEPTIDE   ____________________________________________  EKG  ED ECG REPORT I, Arelia Longest, the attending physician, personally viewed and interpreted this ECG.   Date: 06/13/2015  EKG Time: 1446  Rate: 95  Laurie: atrial fibrillation, rate 95  Axis: Rightward axis  Intervals:none  ST&T Change: No ST segment elevation or depression.  No abnormal T-wave inversion.  ____________________________________________  RADIOLOGY  Pending chest x-ray ____________________________________________   PROCEDURES   ____________________________________________   INITIAL IMPRESSION / ASSESSMENT AND PLAN / ED COURSE  Pertinent labs & imaging results that were available during my care of the patient were reviewed by me and considered in my medical decision making (see chart for details).  ----------------------------------------- 3:11 PM on 06/13/2015 -----------------------------------------  Pending lab and results this time. Patient did receive an albuterol nebulization treatment. Likely admit to the hospital secondary to low 80s saturation on room air and not on home oxygen during the daytime. Signed out  to Dr. Derrill Kay. ____________________________________________   FINAL CLINICAL IMPRESSION(S) / ED DIAGNOSES  Shortness of breath. Hypoxia.    NEW MEDICATIONS STARTED DURING THIS VISIT:  New Prescriptions   No medications on file     Note:  This document was prepared using Dragon voice recognition software and may include unintentional dictation errors.    Myrna Blazer, MD 06/13/15 [phone]

## 2015-06-13 NOTE — Progress Notes (Signed)
Pt c/o SOB, oxygen level at 70-80's on [place] at 3L. 100% on Non-rebreathing mask, Albuterol treatment administered. Pt still c/o SOB, will keep her on non-rebreathing mask for now. Will continue to monitor. MD. [name] Aware.  Pt was on Levaquin earlier and started c/o pruritus, Levaquin was switched to Rocephin by Dr. [name], pt has no more c/o pruritus or any other complication. Will continue to monitor.

## 2015-06-13 NOTE — Progress Notes (Signed)
ANTIBIOTIC CONSULT NOTE - INITIAL  Pharmacy Consult for Levaquin  Indication: pneumonia  Allergies  Allergen Reactions  . Amoxicillin Other (See Comments)    Reaction:  Weakness Has patient had a PCN reaction causing immediate rash, facial/tongue/throat swelling, SOB or lightheadedness with hypotension: No Has patient had a PCN reaction causing severe rash involving mucus membranes or skin necrosis: No Has patient had a PCN reaction that required hospitalization No Has patient had a PCN reaction occurring within the last 10 years: No If all of the above answers are "NO", then may proceed with Cephalosporin use.  . Cetylpyridinium Other (See Comments)    Reaction:  Weakness   . Digoxin Other (See Comments)    Reaction:  Weakness and dehydration  . Risedronate Sodium Rash    Patient Measurements:   Adjusted Body Weight:   Vital Signs: Temp: 97.9 F (36.6 C) (05/17 1933) Temp Source: Oral (05/17 1933) BP: 170/97 mmHg (05/17 1933) Pulse Rate: 92 (05/17 1933) Intake/Output from previous day:   Intake/Output from this shift:    Labs:  Recent Labs  06/13/15 1445  WBC 7.8  HGB 10.6*  PLT 208  CREATININE 1.23*   Estimated Creatinine Clearance: 23.2 mL/min (by C-G formula based on Cr of 1.23). No results for input(s): VANCOTROUGH, VANCOPEAK, VANCORANDOM, GENTTROUGH, GENTPEAK, GENTRANDOM, TOBRATROUGH, TOBRAPEAK, TOBRARND, AMIKACINPEAK, AMIKACINTROU, AMIKACIN in the last 72 hours.   Microbiology: No results found for this or any previous visit (from the past 720 hour(s)).  Medical History: Past Medical History  Diagnosis Date  . Permanent atrial fibrillation (HCC)     a. Chronic Coumadin.  [name]. HTN (hypertension)   . Chronic diastolic CHF (congestive heart failure), NYHA class 1 (HCC)     a. 04/2013 Echo: EF 50-55%, triv MR, mildly dil LA, PASP 48mmHg, mild TR, mild-mod RA dil.  [name]. CAD (coronary artery disease)     a. 2005 Cath: 100% dLCX-->Med managed.  . Sick sinus  syndrome (HCC)     a. 05/2008 s/p MDT EnRhythm DC PPM ([facility]).  . Chronic pain     a. ? due to arthritis  . Glaucoma   . Hyperlipidemia   . Diverticulitis large intestine   . Allergy   . Phlebitis     a. after pacemeker placement in 2010.  [name]. Colon polyps   . Hypothyroidism   . Transfusion history     a. After childbirth 60 years ago (1957)  . Rash/skin eruption     a. Multiple episodes - wide distribution-intermittent, possibly drug rash.  . Pulmonary HTN (HCC)     a. PASP 48mmHg on echo 04/2013.  [name]. Chronic Right Pleural Effusion     a. 07/2012 s/p thoracentesis - [facility].  . Arthritis   . Bronchitis   . Hay fever   . Colon polyps   . History of blood transfusion   . Urine incontinence   . Back complaints   . Acalculous cholecystitis   . CHF (congestive heart failure) (HCC)     Medications:  Prescriptions prior to admission  Medication Sig Dispense Refill Last Dose  . acetaminophen (TYLENOL) 650 MG CR tablet Take 1,300 mg by mouth at bedtime as needed for pain.   PRN at PRN  . atenolol (TENORMIN) 25 MG tablet Take 1 tablet (25 mg total) by mouth 2 (two) times daily. 180 tablet 3 unknown at unknown   . atorvastatin (LIPITOR) 20 MG tablet Take 20 mg by mouth at bedtime.  unknown at unknown   . diltiazem (CARDIZEM CD) 180 MG 24 hr capsule Take 180 mg by mouth daily.   unknown at unknown   . docusate sodium (COLACE) 100 MG capsule Take 1 capsule (100 mg total) by mouth 2 (two) times daily. 10 capsule 0 unknown at unknown   . dorzolamide-timolol (COSOPT) 22.3-6.8 MG/ML ophthalmic solution Place 1 drop into both eyes 2 (two) times daily.    unknown at unknown   . feeding supplement, ENSURE ENLIVE, (ENSURE ENLIVE) LIQD Take 237 mLs by mouth 2 (two) times daily with a meal. 237 mL 12 unknown at unknown   . furosemide (LASIX) 40 MG tablet Take 1 tablet (40 mg total) by mouth 2 (two) times daily. 60 tablet 0 unknown at unknown   .  HYDROcodone-acetaminophen (NORCO) 10-325 MG tablet Take 1 tablet by mouth 3 (three) times daily as needed for moderate pain. 90 tablet 0 PRN at PRN  . ipratropium-albuterol (DUONEB) 0.5-2.5 (3) MG/3ML SOLN Take 3 mLs by nebulization every 6 (six) hours. 360 mL 0 unknown at unknown   . levothyroxine (SYNTHROID, LEVOTHROID) 112 MCG tablet Take 1 tablet (112 mcg total) by mouth daily. 90 tablet 1 unknown at unknown   . polyethylene glycol (MIRALAX / GLYCOLAX) packet Take 17 g by mouth at bedtime.    unknown at unknown   . potassium chloride (K-DUR) 10 MEQ tablet Take 1 tablet (10 mEq total) by mouth daily. 7 tablet 0 unknown at unknown   . traMADol (ULTRAM) 50 MG tablet Take 50 mg by mouth 3 (three) times daily as needed for moderate pain.   PRN at PRN  . Travoprost, BAK Free, (TRAVATAN) 0.004 % SOLN ophthalmic solution Place 1 drop into both eyes at bedtime.   unknown at unknown   . Vitamin D, Ergocalciferol, (DRISDOL) 50000 UNITS CAPS capsule Take 50,000 Units by mouth every 7 (seven) days. Pt takes on Saturday.   unknown at unknown   . warfarin (COUMADIN) 5 MG tablet Take 5 mg by mouth daily at 6 PM.   unknown at unknown    Assessment: CrCl = 23.2 ml/min   Goal of Therapy:  resolution of infection  Plan:  Expected duration 7 days with resolution of temperature and/or normalization of WBC   Levaquin 750 mg IV X 1 given on 5/17 @ 19:00. Levaquin 250 mg IV Q24H ordered to start on 5/18 @ 18:00.   Lamarion Mcevers D 06/13/2015,8:25 PM

## 2015-06-13 NOTE — Progress Notes (Signed)
Pre visit review using our clinic review tool, if applicable. No additional management support is needed unless otherwise documented below in the visit note. 

## 2015-06-13 NOTE — ED Notes (Signed)
Patient arrives to ER via [facility] from MD office for c/o shortness of breath. Per EMS, patient's sats at office were 83% on RA. Patient has h/o CHF and wears O2 only at night. Patient was placed on 2L with little improvement, increased to 3L by EMS with highest sat reading of 94%. Patient denies any pain, states "I just feel really bad". Patient with mildly labored breathing during conversations. Patient states she has had shortness of breath for last 2 weeks.

## 2015-06-14 ENCOUNTER — Ambulatory Visit: Payer: Medicare Other | Admitting: Cardiology

## 2015-06-14 MED ORDER — IPRATROPIUM-ALBUTEROL 0.5-2.5 (3) MG/3ML IN SOLN
3.0000 mL | Freq: Three times a day (TID) | RESPIRATORY_TRACT | Status: DC
  Administered 2015-06-14 – 2015-06-17 (×8): 3 mL via RESPIRATORY_TRACT
  Filled 2015-06-14 (×8): qty 3

## 2015-06-14 MED ORDER — WARFARIN - PHYSICIAN DOSING INPATIENT
Freq: Every day | Status: DC
  Administered 2015-06-14: 18:00:00

## 2015-06-14 NOTE — Progress Notes (Signed)
Pt is A&Ox4, skin is intact with scattered ecchymosis. Pt has a gallbladder drainage tube to RUQ, per pt, drainage is done 2 times a week and it got done yesterday wednesday 17, 2017. Dressing is dry and clean.

## 2015-06-14 NOTE — Care Management (Signed)
82 year old admitted with shortness of breath, left lower lobe PNA and CHF. [facility] with her husband who has dementia.  She has chronic O2 through [facility] [phone] nocturnally. She is trying to get qualified for continuous O2 but will need qualifying sats. She uses a cane and a walker. No home health. Has used [facility] in the past and would [facility]. PCP is Ronna PolioJennifer Walker. Following progression. Will need qualifying O2 sats for continuous O2 prior to discharge.

## 2015-06-14 NOTE — Progress Notes (Signed)
PT Cancellation Note  Patient Details Name: Laurie Larsen MRN: [id number] DOB: 1923-05-19   Cancelled Treatment:    Reason Eval/Treat Not Completed: Other (comment). Pt on non-rebreather at this time. Not appropriate for therapy evaluation at this time. Will re-attempt at another time.   Yekaterina Escutia 06/14/2015, 8:07 AM  Elizabeth PalauStephanie Sumie Remsen, PT, DPT [phone]

## 2015-06-14 NOTE — Progress Notes (Signed)
Patient off of nonrebreather, on 5LNC.  Patient did not tolerate getting IV abx today.  Stated she felt like she started itching again immediately just like with the levaquin yesterday.  DC azithromycin, paged MD but did not get response.

## 2015-06-14 NOTE — Progress Notes (Signed)
[facility] [place] [facility]   PATIENT NAME: Laurie Larsen    MR#:  [id number]  DATE OF BIRTH:  12/15/1923  SUBJECTIVE: Noted for pneumonia. Patient feels short of breath. And very anxious. No chest pain. No cough.   CHIEF COMPLAINT:   Chief Complaint  Patient presents with  . Shortness of Breath    REVIEW OF SYSTEMS:   ROS CONSTITUTIONAL: No fever, fatigue or weakness.  EYES: No blurred or double vision.  EARS, NOSE, AND THROAT: No tinnitus or ear pain.  RESPIRATORY: Complains of shortness of breath.  CARDIOVASCULAR: No chest pain, orthopnea, edema.  GASTROINTESTINAL: No nausea, vomiting, diarrhea or abdominal pain.  GENITOURINARY: No dysuria, hematuria.  ENDOCRINE: No polyuria, nocturia,  HEMATOLOGY: No anemia, easy bruising or bleeding SKIN: No rash or lesion. MUSCULOSKELETAL: No joint pain or arthritis.   NEUROLOGIC: No tingling, numbness, weakness.  PSYCHIATRY: No anxiety or depression.   DRUG ALLERGIES:   Allergies  Allergen Reactions  . Amoxicillin Other (See Comments)    Reaction:  Weakness Has patient had a PCN reaction causing immediate rash, facial/tongue/throat swelling, SOB or lightheadedness with hypotension: No Has patient had a PCN reaction causing severe rash involving mucus membranes or skin necrosis: No Has patient had a PCN reaction that required hospitalization No Has patient had a PCN reaction occurring within the last 10 years: No If all of the above answers are "NO", then may proceed with Cephalosporin use.  . Cetylpyridinium Other (See Comments)    Reaction:  Weakness   . Digoxin Other (See Comments)    Reaction:  Weakness and dehydration  . Levaquin [Levofloxacin] Itching  . Risedronate Sodium Rash    VITALS:  Blood pressure 125/63, pulse 80, temperature 97.7 F (36.5 C), temperature source Oral, resp. rate 16, height  (1.6 m), weight 46.04 kg (101 lb 8 oz), SpO2 93 %.  PHYSICAL EXAMINATION:   GENERAL:  82 y.o.-year-old patient lying in the bed with no acute distress.  EYES: Pupils equal, round, reactive to light and accommodation. No scleral icterus. Extraocular muscles intact.  HEENT: Head atraumatic, normocephalic. Oropharynx and nasopharynx clear.  NECK:  Supple, no jugular venous distention. No thyroid enlargement, no tenderness.  LUNGS: Normal breath sounds bilaterally, no wheezing, rales,rhonchi or crepitation. No use of accessory muscles of respiration.  CARDIOVASCULAR: S1, S2 normal. No murmurs, rubs, or gallops.  ABDOMEN: Soft, nontender, nondistended. Bowel sounds present. No organomegaly or mass.  EXTREMITIES: No pedal edema, cyanosis, or clubbing.  NEUROLOGIC: Cranial nerves II through XII are intact. Muscle strength 5/5 in all extremities. Sensation intact. Gait not checked.  PSYCHIATRIC: The patient is alert and oriented x 3.  SKIN: No obvious rash, lesion, or ulcer.    LABORATORY PANEL:   CBC  Recent Labs Lab 06/13/15 1445  WBC 7.8  HGB 10.6*  HCT 30.3*  PLT 208   ------------------------------------------------------------------------------------------------------------------  Chemistries   Recent Labs Lab 06/13/15 1445  NA 135  K 3.9  CL 107  CO2 22  GLUCOSE 158*  BUN 23*  CREATININE 1.23*  CALCIUM 8.4*   ------------------------------------------------------------------------------------------------------------------  Cardiac Enzymes  Recent Labs Lab 06/13/15 1445  TROPONINI <0.03   ------------------------------------------------------------------------------------------------------------------  RADIOLOGY:  Dg Chest 2 View  06/13/2015  CLINICAL DATA:  Shortness of Breath EXAM: CHEST  2 VIEW COMPARISON:  05/14/2015 FINDINGS: Cardiac shadow is enlarged but stable. A pacing device is again seen. Chronic changes in the right lung base are again noted. Some slight increased density in the left  lung base is noted consistent with early  infiltrate. Mild right midlung atelectasis is noted as well. IMPRESSION: Left basilar infiltrate with associated small effusion. Chronic changes in the right base. Electronically Signed   By: Alcide CleverMark  Lukens M.D.   On: 06/13/2015 16:29    EKG:   Orders placed or performed during the hospital encounter of 06/13/15  . EKG 12-Lead  . EKG 12-Lead    ASSESSMENT AND PLAN:   Acute Respiratory failure with hypoxia secondary to left lower lobe pneumonia and also acute on chronic diastolic heart failure: Improving slowly, oxygen saturation 93% on 3 L today. No lower extremity edema. Continue IV antibiotics, oxygen, monitor closely. #2 acute on chronic diastolic heart failure: EF 55%. Patient received IV Lasix in the emergency room. Continue Home Dose Lasix. Patient Has No Leg Edema at This Time. Appears Euvolemic. #3/atrial fibrillation: Rate controlled. Continue Cardizem, Coumadin. #4  hypertension: Controlled continue atenolol, diltiazem. #6 hypothyroidism continue Synthroid. CODE STATUS DO NOT RESUSCITATE. Hypokalemia;replaced. All the records are reviewed and case discussed with Care Management/Social Workerr. Management plans discussed with the patient, family and they are in agreement.  CODE STATUS: full  TOTAL TIME TAKING CARE OF THIS PATIENT: 35minutes.   POSSIBLE D/C IN 1-2DAYS, DEPENDING ON CLINICAL CONDITION.   Katha HammingKONIDENA,Dema Timmons M.D on 06/14/2015 at 12:37 PM  Between 7am to 6pm - Pager - [phone]  After 6pm go to [email or web address] - password [email or web address]  [facility]  Office  [phone]  CC: Primary care physician; Wynona DoveWALKER,JENNIFER AZBELL, MD   Note: This dictation was prepared with Dragon dictation along with smaller phrase technology. Any transcriptional errors that result from this process are unintentional.

## 2015-06-15 ENCOUNTER — Ambulatory Visit: Payer: Medicare Other | Admitting: Internal Medicine

## 2015-06-15 LAB — PROTIME-INR
INR: 2.83
Prothrombin Time: 29.3 seconds — ABNORMAL HIGH (ref 11.4–15.0)

## 2015-06-15 MED ORDER — FUROSEMIDE 20 MG PO TABS
20.0000 mg | ORAL_TABLET | Freq: Two times a day (BID) | ORAL | Status: DC
Start: 2015-06-15 — End: 2015-06-17
  Administered 2015-06-15 – 2015-06-17 (×4): 20 mg via ORAL
  Filled 2015-06-15 (×4): qty 1

## 2015-06-15 MED ORDER — AZITHROMYCIN 250 MG PO TABS
500.0000 mg | ORAL_TABLET | Freq: Every day | ORAL | Status: DC
  Administered 2015-06-15 – 2015-06-16 (×2): 500 mg via ORAL
  Filled 2015-06-15 (×2): qty 2

## 2015-06-15 MED ORDER — WARFARIN SODIUM 1 MG PO TABS
4.0000 mg | ORAL_TABLET | Freq: Every day | ORAL | Status: DC
  Administered 2015-06-15: 4 mg via ORAL
  Filled 2015-06-15: qty 4

## 2015-06-15 MED ORDER — WARFARIN - PHARMACIST DOSING INPATIENT
Freq: Every day | Status: DC
  Administered 2015-06-15 – 2015-06-16 (×2)

## 2015-06-15 NOTE — Progress Notes (Signed)
ANTICOAGULATION CONSULT NOTE - Initial Consult  Pharmacy Consult for Warfarin Indication: atrial fibrillation  Allergies  Allergen Reactions  . Amoxicillin Other (See Comments)    Reaction:  Weakness Has patient had a PCN reaction causing immediate rash, facial/tongue/throat swelling, SOB or lightheadedness with hypotension: No Has patient had a PCN reaction causing severe rash involving mucus membranes or skin necrosis: No Has patient had a PCN reaction that required hospitalization No Has patient had a PCN reaction occurring within the last 10 years: No If all of the above answers are "NO", then may proceed with Cephalosporin use.  . Cetylpyridinium Other (See Comments)    Reaction:  Weakness   . Digoxin Other (See Comments)    Reaction:  Weakness and dehydration  . Levaquin [Levofloxacin] Itching  . Risedronate Sodium Rash    Patient Measurements: Height: 5\' 3"  (160 cm) Weight: 101 lb 8 oz (46.04 kg) IBW/kg (Calculated) : 52.4 Heparin Dosing Weight:    Vital Signs: Temp: 97.4 F (36.3 C) (05/19 1113) Temp Source: Oral (05/19 0418) BP: 106/57 mmHg (05/19 1113) Pulse Rate: 79 (05/19 1113)  Labs:  Recent Labs  06/13/15 1445 06/13/15 2047 06/15/15 0430  HGB 10.6*  --   --   HCT 30.3*  --   --   PLT 208  --   --   LABPROT  --  26.2* 29.3*  INR  --  2.44 2.83  CREATININE 1.23*  --   --   TROPONINI <0.03  --   --     Estimated Creatinine Clearance: 22.1 mL/min (by C-G formula based on Cr of 1.23).   Medical History: Past Medical History  Diagnosis Date  . Permanent atrial fibrillation (HCC)     a. Chronic Coumadin.  [name]. HTN (hypertension)   . Chronic diastolic CHF (congestive heart failure), NYHA class 1 (HCC)     a. 04/2013 Echo: EF 50-55%, triv MR, mildly dil LA, PASP 48mmHg, mild TR, mild-mod RA dil.  [name]. CAD (coronary artery disease)     a. 2005 Cath: 100% dLCX-->Med managed.  . Sick sinus syndrome (HCC)     a. 05/2008 s/p MDT EnRhythm DC PPM ([facility]).  .  Chronic pain     a. ? due to arthritis  . Glaucoma   . Hyperlipidemia   . Diverticulitis large intestine   . Allergy   . Phlebitis     a. after pacemeker placement in 2010.  [name]. Colon polyps   . Hypothyroidism   . Transfusion history     a. After childbirth 60 years ago (1957)  . Rash/skin eruption     a. Multiple episodes - wide distribution-intermittent, possibly drug rash.  . Pulmonary HTN (HCC)     a. PASP 48mmHg on echo 04/2013.  [name]. Chronic Right Pleural Effusion     a. 07/2012 s/p thoracentesis - [facility].  . Arthritis   . Bronchitis   . Hay fever   . Colon polyps   . History of blood transfusion   . Urine incontinence   . Back complaints   . Acalculous cholecystitis   . CHF (congestive heart failure) (HCC)     Medications:  Scheduled:  . atenolol  25 mg Oral BID  . atorvastatin  20 mg Oral QHS  . azithromycin  500 mg Oral q1800  . cefTRIAXone (ROCEPHIN)  IV  1 g Intravenous Q24H  . diltiazem  180 mg Oral Daily  . docusate sodium  100 mg Oral BID  .  dorzolamide-timolol  1 drop Both Eyes BID  . feeding supplement (ENSURE ENLIVE)  237 mL Oral BID WC  . furosemide  20 mg Oral BID  . ipratropium-albuterol  3 mL Nebulization TID  . latanoprost  1 drop Both Eyes QHS  . levothyroxine  112 mcg Oral QAC breakfast  . multivitamin-lutein  1 capsule Oral Daily  . polyethylene glycol  17 g Oral QHS  . potassium chloride  10 mEq Oral Daily  . sodium chloride flush  3 mL Intravenous Q12H  . warfarin  4 mg Oral q1800  . Warfarin - Pharmacist Dosing Inpatient   Does not apply q1800    Assessment: 82 yo F with hx Atrial Fibrillation on Warfarin  daily at home.   5/17  INR 2.44   Warfarin  5/18  INR 2.83  Goal of Therapy:  INR 2-3 Monitor platelets by anticoagulation protocol: Yes   Plan:  Will adjust Warfarin to 4 mg daily. Patient on Azithromycin/ceftriaxone. F/u INR in am.  Yaneliz Radebaugh A 06/15/2015,4:07 PM

## 2015-06-15 NOTE — Progress Notes (Signed)
ANTIBIOTIC CONSULT NOTE - follow up  Pharmacy Consult for Ceftriaxone  Indication: pneumonia  Allergies  Allergen Reactions  . Amoxicillin Other (See Comments)    Reaction:  Weakness Has patient had a PCN reaction causing immediate rash, facial/tongue/throat swelling, SOB or lightheadedness with hypotension: No Has patient had a PCN reaction causing severe rash involving mucus membranes or skin necrosis: No Has patient had a PCN reaction that required hospitalization No Has patient had a PCN reaction occurring within the last 10 years: No If all of the above answers are "NO", then may proceed with Cephalosporin use.  . Cetylpyridinium Other (See Comments)    Reaction:  Weakness   . Digoxin Other (See Comments)    Reaction:  Weakness and dehydration  . Levaquin [Levofloxacin] Itching  . Risedronate Sodium Rash    Patient Measurements: Height:  (160 cm) Weight: 101 lb 8 oz (46.04 kg) IBW/kg (Calculated) : 52.4 Adjusted Body Weight:   Vital Signs: Temp: 97.4 F (36.3 C) (05/19 1113) Temp Source: Oral (05/19 0418) BP: 106/57 mmHg (05/19 1113) Pulse Rate: 79 (05/19 1113) Intake/Output from previous day: 05/18 0701 - 05/19 0700 In: -  Out: 2350 [Urine:2350] Intake/Output from this shift: Total I/O In: 360 [P.O.:360] Out: 350 [Urine:350]  Labs:  Recent Labs  06/13/15 1445  WBC 7.8  HGB 10.6*  PLT 208  CREATININE 1.23*   Estimated Creatinine Clearance: 22.1 mL/min (by C-G formula based on Cr of 1.23). No results for input(s): VANCOTROUGH, VANCOPEAK, VANCORANDOM, GENTTROUGH, GENTPEAK, GENTRANDOM, TOBRATROUGH, TOBRAPEAK, TOBRARND, AMIKACINPEAK, AMIKACINTROU, AMIKACIN in the last 72 hours.   Microbiology: Recent Results (from the past 720 hour(s))  Blood culture (routine x 2)     Status: None (Preliminary result)   Collection Time: 06/13/15  6:40 PM  Result Value Ref Range Status   Specimen Description BLOOD LEFT FATTY CASTS  Final   Special Requests BOTTLES  DRAWN AEROBIC AND ANAEROBIC 3CCAERO,2CCANA  Final   Culture NO GROWTH < 24 HOURS  Final   Report Status PENDING  Incomplete  Blood culture (routine x 2)     Status: None (Preliminary result)   Collection Time: 06/13/15  6:45 PM  Result Value Ref Range Status   Specimen Description BLOOD RIGHT HAND  Final   Special Requests BOTTLES DRAWN AEROBIC AND ANAEROBIC 1CCAERO,1CCANA  Final   Culture NO GROWTH < 24 HOURS  Final   Report Status PENDING  Incomplete  MRSA PCR Screening     Status: None   Collection Time: 06/13/15  8:47 PM  Result Value Ref Range Status   MRSA by PCR NEGATIVE NEGATIVE Final    Comment:        The GeneXpert MRSA Assay (FDA approved for NASAL specimens only), is one component of a comprehensive MRSA colonization surveillance program. It is not intended to diagnose MRSA infection nor to guide or monitor treatment for MRSA infections.     Medical History: Past Medical History  Diagnosis Date  . Permanent atrial fibrillation (HCC)     a. Chronic Coumadin.  [name] HTN (hypertension)   . Chronic diastolic CHF (congestive heart failure), NYHA class 1 (HCC)     a. 04/2013 Echo: EF 50-55%, triv MR, mildly dil LA, PASP , mild TR, mild-mod RA dil.  [name] CAD (coronary artery disease)     a. 2005 Cath: 100% dLCX-->Med managed.  . Sick sinus syndrome (HCC)     a. 05/2008 s/p MDT EnRhythm DC PPM ([facility]).  . Chronic pain  a. ? due to arthritis  . Glaucoma   . Hyperlipidemia   . Diverticulitis large intestine   . Allergy   . Phlebitis     a. after pacemeker placement in 2010.  [name]. Colon polyps   . Hypothyroidism   . Transfusion history     a. After childbirth 60 years ago (1957)  . Rash/skin eruption     a. Multiple episodes - wide distribution-intermittent, possibly drug rash.  . Pulmonary HTN (HCC)     a. PASP 48mmHg on echo 04/2013.  [name]. Chronic Right Pleural Effusion     a. 07/2012 s/p thoracentesis - [facility].  . Arthritis    . Bronchitis   . Hay fever   . Colon polyps   . History of blood transfusion   . Urine incontinence   . Back complaints   . Acalculous cholecystitis   . CHF (congestive heart failure) (HCC)     Medications:  Prescriptions prior to admission  Medication Sig Dispense Refill Last Dose  . acetaminophen (TYLENOL) 650 MG CR tablet Take 1,300 mg by mouth at bedtime as needed for pain.   PRN at PRN  . atenolol (TENORMIN) 25 MG tablet Take 1 tablet (25 mg total) by mouth 2 (two) times daily. 180 tablet 3 unknown at unknown   . atorvastatin (LIPITOR) 20 MG tablet Take 20 mg by mouth at bedtime.   unknown at unknown   . diltiazem (CARDIZEM CD) 180 MG 24 hr capsule Take 180 mg by mouth daily.   unknown at unknown   . docusate sodium (COLACE) 100 MG capsule Take 1 capsule (100 mg total) by mouth 2 (two) times daily. 10 capsule 0 unknown at unknown   . dorzolamide-timolol (COSOPT) 22.3-6.8 MG/ML ophthalmic solution Place 1 drop into both eyes 2 (two) times daily.    unknown at unknown   . feeding supplement, ENSURE ENLIVE, (ENSURE ENLIVE) LIQD Take 237 mLs by mouth 2 (two) times daily with a meal. 237 mL 12 unknown at unknown   . furosemide (LASIX) 40 MG tablet Take 1 tablet (40 mg total) by mouth 2 (two) times daily. 60 tablet 0 unknown at unknown   . HYDROcodone-acetaminophen (NORCO) 10-325 MG tablet Take 1 tablet by mouth 3 (three) times daily as needed for moderate pain. 90 tablet 0 PRN at PRN  . ipratropium-albuterol (DUONEB) 0.5-2.5 (3) MG/3ML SOLN Take 3 mLs by nebulization every 6 (six) hours. 360 mL 0 unknown at unknown   . levothyroxine (SYNTHROID, LEVOTHROID) 112 MCG tablet Take 1 tablet (112 mcg total) by mouth daily. 90 tablet 1 unknown at unknown   . polyethylene glycol (MIRALAX / GLYCOLAX) packet Take 17 g by mouth at bedtime.    unknown at unknown   . potassium chloride (K-DUR) 10 MEQ tablet Take 1 tablet (10 mEq total) by mouth daily. 7 tablet 0 unknown at unknown   . traMADol (ULTRAM)  50 MG tablet Take 50 mg by mouth 3 (three) times daily as needed for moderate pain.   PRN at PRN  . Travoprost, BAK Free, (TRAVATAN) 0.004 % SOLN ophthalmic solution Place 1 drop into both eyes at bedtime.   unknown at unknown   . Vitamin D, Ergocalciferol, (DRISDOL) 50000 UNITS CAPS capsule Take 50,000 Units by mouth every 7 (seven) days. Pt takes on Saturday.   unknown at unknown   . warfarin (COUMADIN) 5 MG tablet Take 5 mg by mouth daily at 6 PM.   unknown at unknown  82 yo F with PNA/CNF exacerbation.  Goal of Therapy:  resolution of infection  Plan:  Expected duration 7 days with resolution of temperature and/or normalization of WBC   Ceftriaxone 1 gm IV Q24H ordered to start 5/17 @ 22:00. (patient also on azithromycin)  Lakia Gritton A 06/15/2015,2:58 PM

## 2015-06-15 NOTE — Discharge Instructions (Signed)
Heart Failure Clinic appointment on June 28, 2015 at 1:00pm with Clarisa Kindredina Diamante Truszkowski, FNP. Please call [phone] to reschedule.

## 2015-06-15 NOTE — Care Management Important Message (Signed)
Important Message  Patient Details  Name: Laurie Larsen MRN: [id number] Date of Birth: 18-Aug-1923   Medicare Important Message Given:  Yes    [name] [name] 06/15/2015, 12:39 PM

## 2015-06-15 NOTE — Progress Notes (Signed)
[facility] [place] at [facility]   PATIENT NAME: Laurie Larsen    MR#:  [id number]  DATE OF BIRTH:  Feb 19, 1923  SUBJECTIVE: complains of weakness/no sob.  CHIEF COMPLAINT:   Chief Complaint  Patient presents with  . Shortness of Breath    REVIEW OF SYSTEMS:   ROS CONSTITUTIONAL: No fever, c/o weakness.  EYES: No blurred or double vision.  EARS, NOSE, AND THROAT: No tinnitus or ear pain.  RESPIRATORY: Complains of shortness of breath.  CARDIOVASCULAR: No chest pain, orthopnea, edema.  GASTROINTESTINAL: No nausea, vomiting, diarrhea or abdominal pain.  GENITOURINARY: No dysuria, hematuria.  ENDOCRINE: No polyuria, nocturia,  HEMATOLOGY: No anemia, easy bruising or bleeding SKIN: No rash or lesion. MUSCULOSKELETAL: No joint pain or arthritis.   NEUROLOGIC: No tingling, numbness, weakness.  PSYCHIATRY: No anxiety or depression.   DRUG ALLERGIES:   Allergies  Allergen Reactions  . Amoxicillin Other (See Comments)    Reaction:  Weakness Has patient had a PCN reaction causing immediate rash, facial/tongue/throat swelling, SOB or lightheadedness with hypotension: No Has patient had a PCN reaction causing severe rash involving mucus membranes or skin necrosis: No Has patient had a PCN reaction that required hospitalization No Has patient had a PCN reaction occurring within the last 10 years: No If all of the above answers are "NO", then may proceed with Cephalosporin use.  . Cetylpyridinium Other (See Comments)    Reaction:  Weakness   . Digoxin Other (See Comments)    Reaction:  Weakness and dehydration  . Levaquin [Levofloxacin] Itching  . Risedronate Sodium Rash    VITALS:  Blood pressure 135/66, pulse 95, temperature 98 F (36.7 C), temperature source Oral, resp. rate 18, height  (1.6 m), weight 46.04 kg (101 lb 8 oz), SpO2 100 %.  PHYSICAL EXAMINATION:  GENERAL:  82 y.o.-year-old patient lying in the bed with no acute distress.   EYES: Pupils equal, round, reactive to light and accommodation. No scleral icterus. Extraocular muscles intact.  HEENT: Head atraumatic, normocephalic. Oropharynx and nasopharynx clear.  NECK:  Supple, no jugular venous distention. No thyroid enlargement, no tenderness.  LUNGS: Normal breath sounds bilaterally, no wheezing, rales,rhonchi or crepitation. No use of accessory muscles of respiration.  CARDIOVASCULAR: S1, S2 normal. No murmurs, rubs, or gallops.  ABDOMEN: Soft, nontender, nondistended. Bowel sounds present. No organomegaly or mass.  EXTREMITIES: No pedal edema, cyanosis, or clubbing.  NEUROLOGIC: Cranial nerves II through XII are intact. Muscle strength 5/5 in all extremities. Sensation intact. Gait not checked.  PSYCHIATRIC: The patient is alert and oriented x 3.  SKIN: No obvious rash, lesion, or ulcer.    LABORATORY PANEL:   CBC  Recent Labs Lab 06/13/15 1445  WBC 7.8  HGB 10.6*  HCT 30.3*  PLT 208   ------------------------------------------------------------------------------------------------------------------  Chemistries   Recent Labs Lab 06/13/15 1445  NA 135  K 3.9  CL 107  CO2 22  GLUCOSE 158*  BUN 23*  CREATININE 1.23*  CALCIUM 8.4*   ------------------------------------------------------------------------------------------------------------------  Cardiac Enzymes  Recent Labs Lab 06/13/15 1445  TROPONINI <0.03   ------------------------------------------------------------------------------------------------------------------  RADIOLOGY:  Dg Chest 2 View  06/13/2015  CLINICAL DATA:  Shortness of Breath EXAM: CHEST  2 VIEW COMPARISON:  05/14/2015 FINDINGS: Cardiac shadow is enlarged but stable. A pacing device is again seen. Chronic changes in the right lung base are again noted. Some slight increased density in the left lung base is noted consistent with early infiltrate. Mild right midlung atelectasis is noted  as well. IMPRESSION: Left  basilar infiltrate with associated small effusion. Chronic changes in the right base. Electronically Signed   By: Alcide CleverMark  Lukens M.D.   On: 06/13/2015 16:29    EKG:   Orders placed or performed during the hospital encounter of 06/13/15  . EKG 12-Lead  . EKG 12-Lead    ASSESSMENT AND PLAN:   Acute Respiratory failure with hypoxia secondary to left lower lobe pneumonia and also acute on chronic diastolic heart failure: Improving slowly,down to 2litres of o2.. No lower extremity edema. Continue IV antibiotics, oxygen, monitor closely. #2 acute on chronic diastolic heart failure: EF 55%. Patient received IV Lasix in the emergency room.  Continue home dose lasix.  #3/atrial fibrillation: Rate controlled. Continue Cardizem, Coumadin. #4  hypertension: Controlled continue atenolol, diltiazem. #6 hypothyroidism continue Synthroid. 7.generalized weakness;PT eval.today,likley d/c depending on their eval   CODE STATUS DO NOT RESUSCITATE. Hypokalemia;replaced.  All the records are reviewed and case discussed with Care Management/Social Workerr. Management plans discussed with the patient, family and they are in agreement.  CODE STATUS:DNR  TOTAL TIME TAKING CARE OF THIS PATIENT: 35minutes.   POSSIBLE D/C IN 1-2DAYS, DEPENDING ON CLINICAL CONDITION.   Katha HammingKONIDENA,Melisia Leming M.D on 06/15/2015 at 10:43 AM  Between 7am to 6pm - Pager - [phone]  After 6pm go to [email or web address] - password [email or web address]  [facility]  Office  [phone]  CC: Primary care physician; Wynona DoveWALKER,JENNIFER AZBELL, MD   Note: This dictation was prepared with Dragon dictation along with smaller phrase technology. Any transcriptional errors that result from this process are unintentional.

## 2015-06-15 NOTE — Evaluation (Signed)
Physical Therapy Evaluation Patient Details Name: Laurie Larsen MRN: [id number] DOB: 1923-07-11 Today's Date: 06/15/2015   History of Present Illness  Pt is a 82 y.o. female presenting with SOB, chest heaviness, and hypoxic at PCP office.  Pt admitted with acute hypoxic respiratory failure secondary to L lower lobe PNA and CHF acute on chronic diastolic.  PMH includes O2 at night (and PRN during the day 2L), Diastolic CHF, EF 40-98%50-55%, pulmonary htn, a-fib on chronic coumadin, CAD.  Clinical Impression  Prior to admission, pt was independent ambulating with rollator.  [place] [facility] [place] with her husband (who has dementia).  Currently pt is CGA with transfers and ambulation with RW 130 feet; pt steady without loss of balance but does demonstrate fatigue with activity.  Pt's O2 >94% on 3 L/min O2 via nasal cannula with functional mobility during session.  Pt would benefit from skilled PT to address noted impairments and functional limitations.  Recommend pt discharge to home with HHPT when medically appropriate.     Follow Up Recommendations Home health PT    Equipment Recommendations   (pt already owns rollator and RW)    Recommendations for Other Services       Precautions / Restrictions Precautions Precautions: Fall Precaution Comments: R UQ biliary tube Restrictions Weight Bearing Restrictions: No      Mobility  Bed Mobility Overal bed mobility: Modified Independent             General bed mobility comments: Supine to sit with HOB elevated  Transfers Overall transfer level: Needs assistance Equipment used: None;Rolling walker (2 wheeled) Transfers: Sit to/from UGI CorporationStand;Stand Pivot Transfers Sit to Stand: Min guard Stand pivot transfers: Min guard (bed to/from commode)       General transfer comment: steady without loss of balance; strong stand  Ambulation/Gait Ambulation/Gait assistance: Min guard Ambulation Distance (Feet):  130 Feet Assistive device: Rolling walker (2 wheeled) Gait Pattern/deviations: Step-through pattern Gait velocity: decreased   General Gait Details: steady without loss of balance  Stairs            Wheelchair Mobility    Modified Rankin (Stroke Patients Only)       Balance Overall balance assessment: Needs assistance Sitting-balance support: No upper extremity supported;Feet supported Sitting balance-Leahy Scale: Normal     Standing balance support: During functional activity;Single extremity supported Standing balance-Leahy Scale: Good Standing balance comment: hygiene care post toileting standing at commode                             Pertinent Vitals/Pain Pain Assessment: No/denies pain  Vitals stable and WFL throughout treatment session on 3 L/min O2 via nasal cannula.    Home Living Family/patient expects to be discharged to:: Assisted living               Home Equipment: Dan HumphreysWalker - 2 wheels;Cane - single point;Walker - 4 wheels Additional Comments: [facility] [place].  Pt lives with her husband who has dementia.    Prior Function Level of Independence: Independent with assistive device(s)         Comments: Pt uses rollator at baseline for ambulation.     Hand Dominance        Extremity/Trunk Assessment   Upper Extremity Assessment: Generalized weakness           Lower Extremity Assessment: Generalized weakness  Cervical / Trunk Assessment: Kyphotic  Communication   Communication: No difficulties  Cognition Arousal/Alertness: Awake/alert Behavior During Therapy: Anxious (Teary at times d/t frequent hospital visits (nursing notified)) Overall Cognitive Status: Within Functional Limits for tasks assessed                      General Comments   Nursing cleared pt for participation in physical therapy.  Pt agreeable to PT session.    Exercises        Assessment/Plan     PT Assessment Patient needs continued PT services  PT Diagnosis Difficulty walking;Generalized weakness   PT Problem List Decreased strength;Decreased activity tolerance;Decreased balance;Decreased mobility  PT Treatment Interventions DME instruction;Gait training;Functional mobility training;Therapeutic activities;Therapeutic exercise;Balance training;Patient/family education   PT Goals (Current goals can be found in the Care Plan section) Acute Rehab PT Goals Patient Stated Goal: to improve activity tolerance PT Goal Formulation: With patient Time For Goal Achievement: 06/29/15 Potential to Achieve Goals: Good    Frequency Min 2X/week   Barriers to discharge        Co-evaluation               End of Session Equipment Utilized During Treatment: Gait belt;Oxygen (3 L/min via nasal cannula) Activity Tolerance: Patient limited by fatigue Patient left: in chair;with call bell/phone within reach;with chair alarm set Nurse Communication: Mobility status;Precautions         Time: 1610-9604 PT Time Calculation (min) (ACUTE ONLY): 27 min   Charges:   PT Evaluation $PT Eval Moderate Complexity: 1 Procedure PT Treatments $Therapeutic Activity: 8-22 mins   PT G CodesHendricks Limes July 02, 2015, 4:38 PM Hendricks Limes, PT [phone]

## 2015-06-15 NOTE — Progress Notes (Signed)
PHARMACIST - PHYSICIAN COMMUNICATION  CONCERNING: Antibiotic IV to Oral Route Change Policy  RECOMMENDATION: This patient is receiving AZITHROMYCIN by the intravenous route.  Based on criteria approved by the Pharmacy and Therapeutics Committee, the antibiotic(s) is/are being converted to the equivalent oral dose form(s).   DESCRIPTION: These criteria include:  Patient being treated for a respiratory tract infection, urinary tract infection, cellulitis or clostridium difficile associated diarrhea if on metronidazole  The patient is not neutropenic and does not exhibit a GI malabsorption state  The patient is eating (either orally or via tube) and/or has been taking other orally administered medications for a least 24 hours  The patient is improving clinically and has a Tmax < 100.5  If you have questions about this conversion, please contact the Pharmacy Department  []   [phone]( [phone] )  Laurie HawkingAnnie Larsen [x]   [phone]( [phone] )  [facility] []   [phone]( [phone] )  Laurie Larsen []   [phone]( [phone] )  Laurie Larsen []   [phone]( [phone]  Laurie Larsen   Laurie MantisKristin Carr Larsen PharmD Clinical Pharmacist 06/15/2015

## 2015-06-15 NOTE — Consult Note (Signed)
   [facility] Consult   06/15/2015  Cecil CobbsMargaret N Larsen [birth year] [id number]   [facility] for chronic disease management services.  Patient has been engaged by a [employer] Community Care Coordinator.  Our community based plan of care has focused on disease management and community resource support.  Patient will receive a post discharge transition of care call and will be evaluated for monthly home visits for assessments and disease process education. Of note, [facility] Care [facility] services does not replace or interfere with any services that are needed or arranged by inpatient case management or social work.  For additional questions or referrals please contact:  Nuvia Hileman RN, BSN [facility]  Hospital Liaison  [phone]) Business Mobile [phone]) Toll free office

## 2015-06-15 NOTE — Progress Notes (Signed)
Follow-up appointment made at the [facility] for June 28, 2015 at 1:00pm. Thank you.

## 2015-06-16 ENCOUNTER — Inpatient Hospital Stay: Payer: Medicare Other

## 2015-06-16 LAB — BASIC METABOLIC PANEL
ANION GAP: 7 (ref 5–15)
BUN: 35 mg/dL — ABNORMAL HIGH (ref 6–20)
CALCIUM: 8.3 mg/dL — AB (ref 8.9–10.3)
CO2: 28 mmol/L (ref 22–32)
Chloride: 102 mmol/L (ref 101–111)
Creatinine, Ser: 0.71 mg/dL (ref 0.44–1.00)
GFR calc Af Amer: >60 mL/min (ref >60)
GLUCOSE: 92 mg/dL (ref 65–99)
Potassium: 3.6 mmol/L (ref 3.5–5.1)
Sodium: 137 mmol/L (ref 135–145)

## 2015-06-16 LAB — CBC
HCT: 32.4 % — ABNORMAL LOW (ref 35.0–47.0)
Hemoglobin: 10.8 g/dL — ABNORMAL LOW (ref 12.0–16.0)
MCH: 30.8 pg (ref 26.0–34.0)
MCHC: 33.5 g/dL (ref 32.0–36.0)
MCV: 92 fL (ref 80.0–100.0)
PLATELETS: 214 10*3/uL (ref 150–440)
RBC: 3.52 MIL/uL — ABNORMAL LOW (ref 3.80–5.20)
RDW: 15.5 % — AB (ref 11.5–14.5)
WBC: 7.9 10*3/uL (ref 3.6–11.0)

## 2015-06-16 LAB — PROTIME-INR
INR: 3.1
Prothrombin Time: 31.4 seconds — ABNORMAL HIGH (ref 11.4–15.0)

## 2015-06-16 NOTE — Progress Notes (Signed)
[facility] [place] [facility]   PATIENT NAME: Laurie Larsen    MR#:  [id number]  DATE OF BIRTH:  February 08, 1923  SUBJECTIVE:  CHIEF COMPLAINT:   Chief Complaint  Patient presents with  . Shortness of Breath   - Feels better, not quite back to normal. Still needing 3 L of oxygen. -Chest x-ray today  REVIEW OF SYSTEMS:  Review of Systems  Constitutional: Negative for fever and chills.  HENT: Positive for hearing loss. Negative for ear discharge, ear pain and nosebleeds.   Eyes: Negative for blurred vision and double vision.  Respiratory: Positive for shortness of breath. Negative for cough and wheezing.   Cardiovascular: Negative for chest pain, palpitations and leg swelling.  Gastrointestinal: Negative for nausea, vomiting, abdominal pain, diarrhea and constipation.  Genitourinary: Negative for dysuria and urgency.  Musculoskeletal: Negative for myalgias.  Neurological: Positive for weakness. Negative for dizziness, sensory change, speech change, focal weakness, seizures and headaches.    DRUG ALLERGIES:   Allergies  Allergen Reactions  . Amoxicillin Other (See Comments)    Reaction:  Weakness Has patient had a PCN reaction causing immediate rash, facial/tongue/throat swelling, SOB or lightheadedness with hypotension: No Has patient had a PCN reaction causing severe rash involving mucus membranes or skin necrosis: No Has patient had a PCN reaction that required hospitalization No Has patient had a PCN reaction occurring within the last 10 years: No If all of the above answers are "NO", then may proceed with Cephalosporin use.  . Cetylpyridinium Other (See Comments)    Reaction:  Weakness   . Digoxin Other (See Comments)    Reaction:  Weakness and dehydration  . Levaquin [Levofloxacin] Itching  . Risedronate Sodium Rash    VITALS:  Blood pressure 117/64, pulse 71, temperature 98.2 F (36.8 C), temperature source Oral, resp. rate 15, height  5\' 3"  (1.6 m), weight 46.04 kg (101 lb 8 oz), SpO2 92 %.  PHYSICAL EXAMINATION:  Physical Exam  GENERAL:  82 y.o.-year-old elderly, thin patient sitting in the bed with no acute distress.  EYES: Pupils equal, round, reactive to light and accommodation. No scleral icterus. Extraocular muscles intact.  HEENT: Head atraumatic, normocephalic. Oropharynx and nasopharynx clear.  NECK:  Supple, no jugular venous distention. No thyroid enlargement, no tenderness.  LUNGS: Moving air bilaterally, decreased at the left base, no wheezing, rales,rhonchi or crepitation. No use of accessory muscles of respiration.  CARDIOVASCULAR: S1, S2 normal. No  rubs, or gallops. 3/6 systolic murmur is present ABDOMEN: Soft, nontender, nondistended. Bowel sounds present. No organomegaly or mass.  EXTREMITIES: No pedal edema, cyanosis, or clubbing.  NEUROLOGIC: Cranial nerves II through XII are intact. Muscle strength 5/5 in all extremities. Sensation intact. Gait not checked.  PSYCHIATRIC: The patient is alert and oriented x 3.  SKIN: No obvious rash, lesion, or ulcer.    LABORATORY PANEL:   CBC  Recent Labs Lab 06/16/15 0539  WBC 7.9  HGB 10.8*  HCT 32.4*  PLT 214   ------------------------------------------------------------------------------------------------------------------  Chemistries   Recent Labs Lab 06/13/15 1445  NA 135  K 3.9  CL 107  CO2 22  GLUCOSE 158*  BUN 23*  CREATININE 1.23*  CALCIUM 8.4*   ------------------------------------------------------------------------------------------------------------------  Cardiac Enzymes  Recent Labs Lab 06/13/15 1445  TROPONINI <0.03   ------------------------------------------------------------------------------------------------------------------  RADIOLOGY:  No results found.  EKG:   Orders placed or performed during the hospital encounter of 06/13/15  . EKG 12-Lead  . EKG 12-Lead    ASSESSMENT AND  PLAN:   82 year old  female with past medical history significant for diastolic CHF, pulmonary hypertension, atrial fibrillation on Coumadin, coronary artery disease, hypertension brought from PCPs office secondary to hypoxia  #1 acute hypoxic respiratory failure-secondary to CHF exacerbation and also community-acquired pneumonia. -On 3 L oxygen now. Uses only nocturnal home O2 and when necessary the O2 at home. -Continue Rocephin and azithromycin. Follow-up chest x-ray today. -Lasix changed to oral twice a day -Encouraged incentive spirometry. Encourage ambulation.  #2 acute on chronic diastolic CHF-also has moderate tricuspid regurgitation and moderate pulmonary hypertension. -Lasix changed to oral twice a day. Continue to monitor I's and O's. Follow-up chest x-ray. -Wean O2 as tolerated  #3 chronic atrial fibrillation-rate controlled. Patient is on Cardizem and atenolol. -Continue warfarin. Pharmacy to dose as INR at 3.1  #4 hypothyroidism-continue Synthroid  #5 DVT prophylaxis-already on Coumadin  Physical therapy consult appreciated. Will be discharged with home health. Patient is at [facility]   All the records are reviewed and case discussed with Care Management/Social Workerr. Management plans discussed with the patient, family and they are in agreement.  CODE STATUS: DO NOT RESUSCITATE  TOTAL TIME TAKING CARE OF THIS PATIENT: 37  minutes.   POSSIBLE D/C IN 1-2 DAYS, DEPENDING ON CLINICAL CONDITION.   Marialuisa Basara M.D on 06/16/2015 at 8:15 AM  Between 7am to 6pm - Pager - [phone]  After 6pm go to [email or web address] - password [email or web address] [facility]  [place] [facility]  Office  [phone]  CC: Primary care physician; Wynona Dove, MD

## 2015-06-16 NOTE — Progress Notes (Signed)
ANTICOAGULATION CONSULT NOTE - Initial Consult  Pharmacy Consult for Warfarin Indication: atrial fibrillation  Allergies  Allergen Reactions  . Amoxicillin Other (See Comments)    Reaction:  Weakness Has patient had a PCN reaction causing immediate rash, facial/tongue/throat swelling, SOB or lightheadedness with hypotension: No Has patient had a PCN reaction causing severe rash involving mucus membranes or skin necrosis: No Has patient had a PCN reaction that required hospitalization No Has patient had a PCN reaction occurring within the last 10 years: No If all of the above answers are "NO", then may proceed with Cephalosporin use.  . Cetylpyridinium Other (See Comments)    Reaction:  Weakness   . Digoxin Other (See Comments)    Reaction:  Weakness and dehydration  . Levaquin [Levofloxacin] Itching  . Risedronate Sodium Rash    Patient Measurements: Height:  (160 cm) Weight: 101 lb 8 oz (46.04 kg) IBW/kg (Calculated) : 52.4 Heparin Dosing Weight:    Vital Signs: Temp: 98.2 F (36.8 C) (05/20 0436) Temp Source: Oral (05/20 0436) BP: 117/64 mmHg (05/20 0436) Pulse Rate: 71 (05/20 0500)  Labs:  Recent Labs  06/13/15 1445 06/13/15 2047 06/15/15 0430 06/16/15 0539  HGB 10.6*  --   --  10.8*  HCT 30.3*  --   --  32.4*  PLT 208  --   --  214  LABPROT  --  26.2* 29.3* 31.4*  INR  --  2.44 2.83 3.10  CREATININE 1.23*  --   --   --   TROPONINI <0.03  --   --   --     Estimated Creatinine Clearance: 22.1 mL/min (by C-G formula based on Cr of 1.23).   Medical History: Past Medical History  Diagnosis Date  . Permanent atrial fibrillation (HCC)     a. Chronic Coumadin.  [name] HTN (hypertension)   . Chronic diastolic CHF (congestive heart failure), NYHA class 1 (HCC)     a. 04/2013 Echo: EF 50-55%, triv MR, mildly dil LA, PASP , mild TR, mild-mod RA dil.  [name] CAD (coronary artery disease)     a. 2005 Cath: 100% dLCX-->Med managed.  . Sick sinus syndrome (HCC)      a. 05/2008 s/p MDT EnRhythm DC PPM ([facility]).  . Chronic pain     a. ? due to arthritis  . Glaucoma   . Hyperlipidemia   . Diverticulitis large intestine   . Allergy   . Phlebitis     a. after pacemeker placement in 2010.  [name] Colon polyps   . Hypothyroidism   . Transfusion history     a. After childbirth 60 years ago (1957)  . Rash/skin eruption     a. Multiple episodes - wide distribution-intermittent, possibly drug rash.  . Pulmonary HTN (HCC)     a. PASP on echo 04/2013.  [name] Chronic Right Pleural Effusion     a. 07/2012 s/p thoracentesis - Followed by Dr. Delford Field [facility].  . Arthritis   . Bronchitis   . Hay fever   . Colon polyps   . History of blood transfusion   . Urine incontinence   . Back complaints   . Acalculous cholecystitis   . CHF (congestive heart failure) (HCC)     Medications:  Scheduled:  . atenolol  25 mg Oral BID  . atorvastatin  20 mg Oral QHS  . azithromycin  500 mg Oral q1800  . cefTRIAXone (ROCEPHIN)  IV  1 g Intravenous Q24H  . diltiazem  180 mg Oral Daily  . docusate sodium  100 mg Oral BID  . dorzolamide-timolol  1 drop Both Eyes BID  . feeding supplement (ENSURE ENLIVE)  237 mL Oral BID WC  . furosemide  20 mg Oral BID  . ipratropium-albuterol  3 mL Nebulization TID  . latanoprost  1 drop Both Eyes QHS  . levothyroxine  112 mcg Oral QAC breakfast  . multivitamin-lutein  1 capsule Oral Daily  . polyethylene glycol  17 g Oral QHS  . potassium chloride  10 mEq Oral Daily  . sodium chloride flush  3 mL Intravenous Q12H  . Warfarin - Pharmacist Dosing Inpatient   Does not apply q1800    Assessment: 82 yo F with hx Atrial Fibrillation on Warfarin 5mg  daily at home.   5/17  INR 2.44   Warfarin 5mg  5/18                   Warfarin 5 mg 5/19  INR 2.83   Warfarin 4 mg 5/20  INR 3.10   Goal of Therapy:  INR 2-3 Monitor platelets by anticoagulation protocol: Yes   Plan:  Will hold warfarin today. F/u INR in  am.  Luisa Harthristy, Tatem Holsonback D 06/16/2015,7:41 AM

## 2015-06-17 LAB — PROTIME-INR
INR: 2.32
PROTHROMBIN TIME: 25.2 s — AB (ref 11.4–15.0)

## 2015-06-17 MED ORDER — WARFARIN SODIUM 1 MG PO TABS: 4.0000 mg | ORAL_TABLET | Freq: Every day | ORAL | Status: DC

## 2015-06-17 MED ORDER — IPRATROPIUM-ALBUTEROL 0.5-2.5 (3) MG/3ML IN SOLN: 3.0000 mL | Freq: Four times a day (QID) | RESPIRATORY_TRACT | Status: DC | PRN

## 2015-06-17 MED ORDER — CEFDINIR 300 MG PO CAPS: 300.0000 mg | ORAL_CAPSULE | Freq: Two times a day (BID) | ORAL | Status: DC

## 2015-06-17 MED ORDER — AZITHROMYCIN 500 MG PO TABS: 500.0000 mg | ORAL_TABLET | Freq: Every day | ORAL | Status: DC

## 2015-06-17 MED ORDER — HYDROCODONE-ACETAMINOPHEN 10-325 MG PO TABS: 1.0000 | ORAL_TABLET | Freq: Three times a day (TID) | ORAL | Status: DC | PRN

## 2015-06-17 MED ORDER — FUROSEMIDE 20 MG PO TABS: 20.0000 mg | ORAL_TABLET | Freq: Two times a day (BID) | ORAL | Status: DC

## 2015-06-17 NOTE — Discharge Summary (Signed)
[facility] - [place] at [facility]   PATIENT NAME: Laurie Larsen    MR#:  [id number]  DATE OF BIRTH:  [birth year]  DATE OF ADMISSION:  06/13/2015 ADMITTING PHYSICIAN: Enedina Finner, MD  DATE OF DISCHARGE: 06/17/2015  PRIMARY CARE PHYSICIAN: Wynona Dove, MD    ADMISSION DIAGNOSIS:  Shortness of breath [R06.02] Hypoxia [R09.02]  DISCHARGE DIAGNOSIS:  Active Problems:   Pneumonia   SECONDARY DIAGNOSIS:   Past Medical History  Diagnosis Date  . Permanent atrial fibrillation (HCC)     a. Chronic Coumadin.  [name] HTN (hypertension)   . Chronic diastolic CHF (congestive heart failure), NYHA class 1 (HCC)     a. 04/2013 Echo: EF 50-55%, triv MR, mildly dil LA, PASP , mild TR, mild-mod RA dil.  [name] CAD (coronary artery disease)     a. 2005 Cath: 100% dLCX-->Med managed.  . Sick sinus syndrome (HCC)     a. 05/2008 s/p MDT EnRhythm DC PPM ([facility]).  . Chronic pain     a. ? due to arthritis  . Glaucoma   . Hyperlipidemia   . Diverticulitis large intestine   . Allergy   . Phlebitis     a. after pacemeker placement in 2010.  [name] Colon polyps   . Hypothyroidism   . Transfusion history     a. After childbirth 60 years ago (1957)  . Rash/skin eruption     a. Multiple episodes - wide distribution-intermittent, possibly drug rash.  . Pulmonary HTN (HCC)     a. PASP on echo 04/2013.  [name] Chronic Right Pleural Effusion     a. 07/2012 s/p thoracentesis - Followed by Dr. Delford Field [facility] Pulmonology.  . Arthritis   . Bronchitis   . Hay fever   . Colon polyps   . History of blood transfusion   . Urine incontinence   . Back complaints   . Acalculous cholecystitis   . CHF (congestive heart failure) [facility])     HOSPITAL COURSE:   82 year old female with past medical history significant for diastolic CHF, pulmonary hypertension, atrial fibrillation on Coumadin, coronary artery disease, hypertension brought from PCPs office secondary to  hypoxia  #1 acute hypoxic respiratory failure-secondary to CHF exacerbation and also community-acquired pneumonia. -On 2 L oxygen now. Uses only nocturnal home O2 and when necessary the O2 at home. Will need it to be continuous from now on. -X-ray with mild to moderate right pleural effusion. Stable right base atelectasis. -Antibiotics changed to Omnicef and azithromycin at discharge. -Lasix changed to oral twice a day -Encouraged incentive spirometry. Encourage ambulation.  #2 acute on chronic diastolic CHF-also has moderate tricuspid regurgitation and moderate pulmonary hypertension. -Lasix changed to oral twice a day. Follow-up chest x-ray with mild to moderate right-sided pleural effusion. Continue to monitor as outpatient. -Patient will need 2 L home oxygen.  #3 chronic atrial fibrillation-rate controlled. Patient is on Cardizem and atenolol. -Continue warfarin. Pharmacy to dose as INR at 2.3 -Monitor closely while on antibiotics.  #4 hypothyroidism-continue Synthroid  Physical therapy consult appreciated. Will be discharged with home health. Patient is at [facility]  CODE STATUS: DO NOT RESUSCITATE   DISCHARGE CONDITIONS:   Guarded  CONSULTS OBTAINED:   pulmonary critical care follow-up by Dr. Ardyth Man  DRUG ALLERGIES:   Allergies  Allergen Reactions  . Amoxicillin Other (See Comments)    Reaction:  Weakness Has patient had a PCN reaction causing immediate rash, facial/tongue/throat swelling, SOB or lightheadedness with hypotension: No Has patient  had a PCN reaction causing severe rash involving mucus membranes or skin necrosis: No Has patient had a PCN reaction that required hospitalization No Has patient had a PCN reaction occurring within the last 10 years: No If all of the above answers are "NO", then may proceed with Cephalosporin use.  . Cetylpyridinium Other (See Comments)    Reaction:  Weakness   . Digoxin Other (See Comments)    Reaction:  Weakness  and dehydration  . Levaquin [Levofloxacin] Itching  . Risedronate Sodium Rash    DISCHARGE MEDICATIONS:   Current Discharge Medication List    START taking these medications   Details  azithromycin (ZITHROMAX) 500 MG tablet Take 1 tablet (500 mg total) by mouth daily. X 3 more days Qty: 3 tablet, Refills: 0    cefdinir (OMNICEF) 300 MG capsule Take 1 capsule (300 mg total) by mouth 2 (two) times daily. X 5 more days Qty: 10 capsule, Refills: 0      CONTINUE these medications which have CHANGED   Details  furosemide (LASIX) 20 MG tablet Take 1 tablet (20 mg total) by mouth 2 (two) times daily. Qty: 60 tablet, Refills: 2    HYDROcodone-acetaminophen (NORCO) 10-325 MG tablet Take 1 tablet by mouth 3 (three) times daily as needed for moderate pain or severe pain. Qty: 30 tablet, Refills: 0      CONTINUE these medications which have NOT CHANGED   Details  acetaminophen (TYLENOL) 650 MG CR tablet Take 1,300 mg by mouth at bedtime as needed for pain.    atenolol (TENORMIN) 25 MG tablet Take 1 tablet (25 mg total) by mouth 2 (two) times daily. Qty: 180 tablet, Refills: 3    atorvastatin (LIPITOR) 20 MG tablet Take 20 mg by mouth at bedtime.    diltiazem (CARDIZEM CD) 180 MG 24 hr capsule Take 180 mg by mouth daily.    docusate sodium (COLACE) 100 MG capsule Take 1 capsule (100 mg total) by mouth 2 (two) times daily. Qty: 10 capsule, Refills: 0    dorzolamide-timolol (COSOPT) 22.3-6.8 MG/ML ophthalmic solution Place 1 drop into both eyes 2 (two) times daily.     feeding supplement, ENSURE ENLIVE, (ENSURE ENLIVE) LIQD Take 237 mLs by mouth 2 (two) times daily with a meal. Qty: 237 mL, Refills: 12    ipratropium-albuterol (DUONEB) 0.5-2.5 (3) MG/3ML SOLN Take 3 mLs by nebulization every 6 (six) hours. Qty: 360 mL, Refills: 0    levothyroxine (SYNTHROID, LEVOTHROID) 112 MCG tablet Take 1 tablet (112 mcg total) by mouth daily. Qty: 90 tablet, Refills: 1    polyethylene glycol  (MIRALAX / GLYCOLAX) packet Take 17 g by mouth at bedtime.     potassium chloride (K-DUR) 10 MEQ tablet Take 1 tablet (10 mEq total) by mouth daily. Qty: 7 tablet, Refills: 0    traMADol (ULTRAM) 50 MG tablet Take 50 mg by mouth 3 (three) times daily as needed for moderate pain.    Travoprost, BAK Free, (TRAVATAN) 0.004 % SOLN ophthalmic solution Place 1 drop into both eyes at bedtime.    Vitamin D, Ergocalciferol, (DRISDOL) 50000 UNITS CAPS capsule Take 50,000 Units by mouth every 7 (seven) days. Pt takes on Saturday.    warfarin (COUMADIN) 5 MG tablet Take 5 mg by mouth daily at 6 PM.         DISCHARGE INSTRUCTIONS:   1. PCP follow-up in and to 2 weeks 2. INR check in 3 days 3. Continue to use incentive spirometry 4. Pulmonary follow-up in 2  weeks 5. Congestive heart failure clinic follow-up as per schedule.   If you experience worsening of your admission symptoms, develop shortness of breath, life threatening emergency, suicidal or homicidal thoughts you must seek medical attention immediately by calling 911 or calling your MD immediately  if symptoms less severe.  You Must read complete instructions/literature along with all the possible adverse reactions/side effects for all the Medicines you take and that have been prescribed to you. Take any new Medicines after you have completely understood and accept all the possible adverse reactions/side effects.   Please note  You were cared for by a hospitalist during your hospital stay. If you have any questions about your discharge medications or the care you received while you were in the hospital after you are discharged, you can call the unit and asked to speak with the hospitalist on call if the hospitalist that took care of you is not available. Once you are discharged, your primary care physician will handle any further medical issues. Please note that NO REFILLS for any discharge medications will be authorized once you are  discharged, as it is imperative that you return to your primary care physician (or establish a relationship with a primary care physician if you do not have one) for your aftercare needs so that they can reassess your need for medications and monitor your lab values.    Today   CHIEF COMPLAINT:   Chief Complaint  Patient presents with  . Shortness of Breath     VITAL SIGNS:  Blood pressure 144/62, pulse 73, temperature 97.7 F (36.5 C), temperature source Oral, resp. rate 16, height 5\' 3"  (1.6 m), weight 46.04 kg (101 lb 8 oz), SpO2 96 %.  I/O:   Intake/Output Summary (Last 24 hours) at 06/17/15 0934 Last data filed at 06/17/15 0219  Gross per 24 hour  Intake    390 ml  Output    750 ml  Net   -360 ml    PHYSICAL EXAMINATION:   Physical Exam  GENERAL: 82 y.o.-year-old elderly, thin patient sitting in the bed with no acute distress.  EYES: Pupils equal, round, reactive to light and accommodation. No scleral icterus. Extraocular muscles intact.  HEENT: Head atraumatic, normocephalic. Oropharynx and nasopharynx clear.  NECK: Supple, no jugular venous distention. No thyroid enlargement, no tenderness.  LUNGS: Moving air bilaterally, decreased at the left base, no wheezing, rales,rhonchi or crepitation. No use of accessory muscles of respiration.  CARDIOVASCULAR: S1, S2 normal. No rubs, or gallops. 3/6 systolic murmur is present ABDOMEN: Soft, nontender, nondistended. Bowel sounds present. No organomegaly or mass.  EXTREMITIES: No pedal edema, cyanosis, or clubbing.  NEUROLOGIC: Cranial nerves II through XII are intact. Muscle strength 5/5 in all extremities. Sensation intact. Gait not checked.  PSYCHIATRIC: The patient is alert and oriented x 3.  SKIN: No obvious rash, lesion, or ulcer.   DATA REVIEW:   CBC  Recent Labs Lab 06/16/15 0539  WBC 7.9  HGB 10.8*  HCT 32.4*  PLT 214    Chemistries   Recent Labs Lab 06/16/15 0539  NA 137  K 3.6  CL 102   CO2 28  GLUCOSE 92  BUN 35*  CREATININE 0.71  CALCIUM 8.3*    Cardiac Enzymes  Recent Labs Lab 06/13/15 1445  TROPONINI <0.03    Microbiology Results  Results for orders placed or performed during the hospital encounter of 06/13/15  Blood culture (routine x 2)     Status: None (Preliminary result)  Collection Time: 06/13/15  6:40 PM  Result Value Ref Range Status   Specimen Description BLOOD LEFT FATTY CASTS  Final   Special Requests BOTTLES DRAWN AEROBIC AND ANAEROBIC 3CCAERO,2CCANA  Final   Culture NO GROWTH 3 DAYS  Final   Report Status PENDING  Incomplete  Blood culture (routine x 2)     Status: None (Preliminary result)   Collection Time: 06/13/15  6:45 PM  Result Value Ref Range Status   Specimen Description BLOOD RIGHT HAND  Final   Special Requests BOTTLES DRAWN AEROBIC AND ANAEROBIC 1CCAERO,1CCANA  Final   Culture NO GROWTH 3 DAYS  Final   Report Status PENDING  Incomplete  MRSA PCR Screening     Status: None   Collection Time: 06/13/15  8:47 PM  Result Value Ref Range Status   MRSA by PCR NEGATIVE NEGATIVE Final    Comment:        The GeneXpert MRSA Assay (FDA approved for NASAL specimens only), is one component of a comprehensive MRSA colonization surveillance program. It is not intended to diagnose MRSA infection nor to guide or monitor treatment for MRSA infections.     RADIOLOGY:  Dg Chest 2 View  06/16/2015  CLINICAL DATA:  Pneumonia A-fib, HTN, chronic diastolic CHF, h/o chronic right pleural effusion(07/2012), h/o bronchitis, never smoker Sx: pacemaker insertion-06/20/2008, breast sx-2010 EXAM: CHEST  2 VIEW COMPARISON:  06/13/2015 FINDINGS: Moderate right pleural effusion, increased in size from the prior study. Persistent lung base opacity consistent with combination of chronic irregular interstitial thickening, and atelectasis; pneumonia is possible. Irregular interstitial thickening is seen throughout the lungs, stable. There is mild stable  scarring at the apices. Cardiac silhouette is mildly enlarged. No mediastinal or hilar masses. Left anterior chest wall pacemaker is stable. IMPRESSION: 1. Increased pleural fluid on the right when compared to the most recent prior study. 2. Persistent lung base opacity, most likely atelectasis. Pneumonia is possible. 3. Diffuse interstitial thickening. This is chronic, similar to more remote previous chest radiographs. No overt pulmonary edema. Electronically Signed   By: Amie Portland M.D.   On: 06/16/2015 09:24    EKG:   Orders placed or performed during the hospital encounter of 06/13/15  . EKG 12-Lead  . EKG 12-Lead      Management plans discussed with the patient, family and they are in agreement.  CODE STATUS:     Code Status Orders        Start     Ordered   06/13/15 2009  Do not attempt resuscitation (DNR)   Continuous    Question Answer Comment  In the event of cardiac or respiratory ARREST Do not call a "code blue"   In the event of cardiac or respiratory ARREST Do not perform Intubation, CPR, defibrillation or ACLS   In the event of cardiac or respiratory ARREST Use medication by any route, position, wound care, and other measures to relive pain and suffering. May use oxygen, suction and manual treatment of airway obstruction as needed for comfort.      06/13/15 2008    Code Status History    Date Active Date Inactive Code Status Order ID Comments User Context   03/25/2015 11:04 AM 03/29/2015  5:19 PM Full Code [id number]  Ramonita Lab, MD ED   03/25/2015 11:03 AM 03/25/2015 11:04 AM DNR [id number]  Ramonita Lab, MD ED   10/17/2014  9:34 PM 10/24/2014  6:56 PM Full Code [id number]  Ricarda Frame, MD Inpatient   05/31/2014  4:55  PM 06/04/2014  5:10 PM DNR [id number]  Ramonita Lab, MD Inpatient    Advance Directive Documentation        Most Recent Value   Type of Advance Directive  Out of facility DNR (pink MOST or yellow form)   Pre-existing out of facility DNR order (yellow form  or pink MOST form)  Yellow form placed in chart (order not valid for inpatient use)   "MOST" Form in Place?        TOTAL TIME TAKING CARE OF THIS PATIENT: 38 minutes.    Enid Baas M.D on 06/17/2015 at 9:34 AM  Between 7am to 6pm - Pager - [phone]  After 6pm go to [email or web address] - password [email or web address] [facility]  [facility]  Office  [phone]  CC: Primary care physician; Wynona Dove, MD

## 2015-06-17 NOTE — Progress Notes (Signed)
At approximately 9:30 pm, pt became tearful expressing [place]. Pt stating that she has nothing to live for, and that her "health is no good". RN provided emotional suppoert. Chaplin notified. Will continue to monitor.  Mayra NeerNesbitt, Donnice Nielsen M

## 2015-06-17 NOTE — Care Management Note (Signed)
Case Management Note  Patient Details  Name: Cecil CobbsMargaret N Sandlin MRN: [id number] Date of Birth: [birth year]  Subjective/Objective:      Portable oxygen tank delivered by [facility] to Mrs Katrinka BlazingSmith in room 246 at 11:20am.             Action/Plan:   Expected Discharge Date:                  Expected Discharge Plan:     In-House Referral:     Discharge planning Services     Post Acute Care Choice:    Choice offered to:     DME Arranged:    DME Agency:     HH Arranged:    HH Agency:     Status of Service:     Medicare Important Message Given:  Yes Date Medicare IM Given:    Medicare IM give by:    Date Additional Medicare IM Given:    Additional Medicare Important Message give by:     If discussed at Long Length of Stay Meetings, dates discussed:    Additional Comments:  Amauri Medellin A, RN 06/17/2015, 11:22 AM

## 2015-06-17 NOTE — Progress Notes (Signed)
SATURATION QUALIFICATIONS: (This note is used to comply with regulatory documentation for home oxygen)  Patient Saturations on Room Air at Rest = 87%  Patient Saturations on Room Air while Ambulating = %  Patient Saturations on 2 Liters of oxygen while resting = 94%  Please briefly explain why patient needs home oxygen:Pts oxygen saturation drops on room air. Pt needs oxygen to maintain oxygen sats >92%

## 2015-06-17 NOTE — Care Management Note (Addendum)
Case Management Note  Patient Details  Name: Laurie Larsen MRN: [id number] Date of Birth: February 20, 1923  Subjective/Objective:     A referral for home health PT and RN was faxed to [facility]. Currently open for oxygen to [facility]. Call to [facility] [phone] and spoke with Micah. [name] and a change in her status from nocturnal only to continuous 2L N/C. Micah reported that a driver would notify this Clinical research associatewriter by phone when the driver is en-route. Driver called within 10 minutes and is currently [facility] [place]             Action/Plan:   Expected Discharge Date:                  Expected Discharge Plan:     In-House Referral:     Discharge planning Services     Post Acute Care Choice:    Choice offered to:     DME Arranged:    DME Agency:     HH Arranged:    HH Agency:     Status of Service:     Medicare Important Message Given:  Yes Date Medicare IM Given:    Medicare IM give by:    Date Additional Medicare IM Given:    Additional Medicare Important Message give by:     If discussed at Long Length of Stay Meetings, dates discussed:    Additional Comments:  Clois Treanor A, RN 06/17/2015, 10:28 AM

## 2015-06-17 NOTE — Progress Notes (Signed)
ANTICOAGULATION CONSULT NOTE - Initial Consult  Pharmacy Consult for Warfarin Indication: atrial fibrillation  Allergies  Allergen Reactions  . Amoxicillin Other (See Comments)    Reaction:  Weakness Has patient had a PCN reaction causing immediate rash, facial/tongue/throat swelling, SOB or lightheadedness with hypotension: No Has patient had a PCN reaction causing severe rash involving mucus membranes or skin necrosis: No Has patient had a PCN reaction that required hospitalization No Has patient had a PCN reaction occurring within the last 10 years: No If all of the above answers are "NO", then may proceed with Cephalosporin use.  . Cetylpyridinium Other (See Comments)    Reaction:  Weakness   . Digoxin Other (See Comments)    Reaction:  Weakness and dehydration  . Levaquin [Levofloxacin] Itching  . Risedronate Sodium Rash    Patient Measurements: Height:  (160 cm) Weight: 101 lb 8 oz (46.04 kg) IBW/kg (Calculated) : 52.4 Heparin Dosing Weight:    Vital Signs: Temp: 97.7 F (36.5 C) (05/21 0508) Temp Source: Oral (05/21 0508) BP: 144/62 mmHg (05/21 0508) Pulse Rate: 73 (05/21 0508)  Labs:  Recent Labs  06/15/15 0430 06/16/15 0539 06/17/15 0530  HGB  --  10.8*  --   HCT  --  32.4*  --   PLT  --  214  --   LABPROT 29.3* 31.4* 25.2*  INR 2.83 3.10 2.32  CREATININE  --  0.71  --     Estimated Creatinine Clearance: 33.9 mL/min (by C-G formula based on Cr of 0.71).   Medical History: Past Medical History  Diagnosis Date  . Permanent atrial fibrillation (HCC)     a. Chronic Coumadin.  [name] HTN (hypertension)   . Chronic diastolic CHF (congestive heart failure), NYHA class 1 (HCC)     a. 04/2013 Echo: EF 50-55%, triv MR, mildly dil LA, PASP , mild TR, mild-mod RA dil.  [name] CAD (coronary artery disease)     a. 2005 Cath: 100% dLCX-->Med managed.  . Sick sinus syndrome (HCC)     a. 05/2008 s/p MDT EnRhythm DC PPM ([facility]).  . Chronic pain     a. ? due to  arthritis  . Glaucoma   . Hyperlipidemia   . Diverticulitis large intestine   . Allergy   . Phlebitis     a. after pacemeker placement in 2010.  [name] Colon polyps   . Hypothyroidism   . Transfusion history     a. After childbirth 60 years ago (1957)  . Rash/skin eruption     a. Multiple episodes - wide distribution-intermittent, possibly drug rash.  . Pulmonary HTN (HCC)     a. PASP on echo 04/2013.  [name] Chronic Right Pleural Effusion     a. 07/2012 s/p thoracentesis - Followed by Dr. Delford Field [facility] Pulmonology.  . Arthritis   . Bronchitis   . Hay fever   . Colon polyps   . History of blood transfusion   . Urine incontinence   . Back complaints   . Acalculous cholecystitis   . CHF (congestive heart failure) (HCC)     Medications:  Scheduled:  . atenolol  25 mg Oral BID  . atorvastatin  20 mg Oral QHS  . azithromycin  500 mg Oral q1800  . cefTRIAXone (ROCEPHIN)  IV  1 g Intravenous Q24H  . diltiazem  180 mg Oral Daily  . docusate sodium  100 mg Oral BID  . dorzolamide-timolol  1 drop Both Eyes BID  . feeding supplement (  ENSURE ENLIVE)  237 mL Oral BID WC  . furosemide  20 mg Oral BID  . ipratropium-albuterol  3 mL Nebulization TID  . latanoprost  1 drop Both Eyes QHS  . levothyroxine  112 mcg Oral QAC breakfast  . multivitamin-lutein  1 capsule Oral Daily  . polyethylene glycol  17 g Oral QHS  . potassium chloride  10 mEq Oral Daily  . sodium chloride flush  3 mL Intravenous Q12H  . Warfarin - Pharmacist Dosing Inpatient   Does not apply q1800    Assessment: 82 yo F with hx Atrial Fibrillation on Warfarin 5mg  daily at home.   5/17  INR 2.44   Warfarin 5mg  5/18                   Warfarin 5 mg 5/19  INR 2.83   Warfarin 4 mg 5/20  INR 3.10    5/21  INR 2.32      Goal of Therapy:  INR 2-3 Monitor platelets by anticoagulation protocol: Yes   Plan:  Will resume warfarin at 4 mg daily as patient remains on antibiotics. Will f/u AM INR.   Laurie Larsen, Abriana Saltos  D 06/17/2015,7:21 AM

## 2015-06-17 NOTE — Progress Notes (Signed)
Pt discharged home via wheelchair. IV discontinued without incident. Home medication list, prescriptions and follow-up appointment reviewed with patient. No questions verbalized. Pt left hospital with portable oxygen and DNR.

## 2015-06-18 ENCOUNTER — Telehealth: Payer: Self-pay

## 2015-06-18 ENCOUNTER — Other Ambulatory Visit: Payer: Self-pay | Admitting: *Deleted

## 2015-06-18 LAB — CULTURE, BLOOD (ROUTINE X 2)
CULTURE: NO GROWTH
Culture: NO GROWTH

## 2015-06-18 NOTE — Patient Outreach (Signed)
First attempt made to contact pt for transition of care (discharged 5/21).  HIPPA compliant voice message left with contact name and number.   If no response, will try again.    Shayne Alkenose M.   Jguadalupe Opiela RN CCM [facility]  [phone]

## 2015-06-18 NOTE — Telephone Encounter (Signed)
Transition Care Management Follow-up Telephone Call   Date discharged? 06/17/15   How have you been since you were released from the hospital? No SOB.  Home oxygen set @3L  during the day/2L at night.  Eating/voiding.  No pain.     Do you understand why you were in the hospital? YES, I could not breath.   Do you understand the discharge instructions? YES, increase activity as tolerated. Using spirometer PRN.     Where were you discharged to? HOME.   Items Reviewed:  Medications reviewed: YES, taking ZITHROMAX 500mg , OMNICEF 300mg  without issues.  STARTED a new dose of LASIX 20mg , NORCO 10-325mg , no problems.  Continuing all other scheduled home medications which have not changed.  Allergies reviewed: YES, Amoxicillin, Cetylpyridiniuim, Digoxin, Levaquin, Riserdronate.   Dietary changes reviewed: YES, low sodium diet, no problem.  Referrals reviewed: YES, appointment scheduled with Pulmonary.  Awaiting call back from CHF clinic to confirm appointment. INR scheduled with facility for draw on 06/20/15.   Functional Questionnaire:   Activities of Daily Living (ADLs):   She states they are independent in the following: Bathing, dressing, eating, toileting. States they require assistance with the following: Ambulating (walker in use), meal prep (facility provide/asst).   Any transportation issues/concerns?:  YES, dependent on driver's schedule at the facility.   Any patient concerns? NO, not at this time.     Confirmed importance and date/time of follow-up visits scheduled YES, I will call patient back tomorrow to confirm appointment placement is in agreement with driver's availability.  Provider Appointment to be booked with Dr. Dan HumphreysWalker (PCP).  Confirmed with patient if condition begins to worsen call PCP or go to the ER.  Patient was given the office number and encouraged to call back with question or concerns.  : YES, patient verbalized understanding.

## 2015-06-19 LAB — PROTIME-INR: INR: 1.3 — AB (ref 0.9–1.1)

## 2015-06-20 ENCOUNTER — Ambulatory Visit (INDEPENDENT_AMBULATORY_CARE_PROVIDER_SITE_OTHER): Payer: Medicare Other | Admitting: Interventional Cardiology

## 2015-06-20 ENCOUNTER — Other Ambulatory Visit: Payer: Self-pay | Admitting: *Deleted

## 2015-06-20 DIAGNOSIS — I482 Chronic atrial fibrillation, unspecified: Secondary | ICD-10-CM

## 2015-06-20 NOTE — Patient Outreach (Signed)
Follow up call to complete transition of care.  Pt reports with review of discharge medications with RN CM-  Did not know to do Duoneb every 6 hours.   Pt reports to f/u with lung MD tomorrow, received a call from Dr. Tilman NeatWalker's office to call her back with f/u appointment.  Discussed with pt appointment at HF clinic 6/1 which she was not aware, number provided for pt to call.   Pt reports no problems with transportation, facility provides.  [facility], discussed plan to follow for transition of care, weekly phone calls 31 days post discharge, home visit (6/8- previously scheduled).    Patient was recently discharged from hospital and all medications have been reviewed.- 5/24.   Plan to f/u again telephonically 5/31 as part of ongoing transition of care.  Shayne Alkenose M.   Uzziel Russey RN CCM [facility] Care Management  [phone]

## 2015-06-20 NOTE — Patient Outreach (Signed)
Transition of care call (week 1, second call- discharged 5/21).  Pt reports nurse ([facility]) is here now, not a good time to talk.   As discussed, RN CM to call back later today to complete transition of care.    Shayne Alkenose M.   Pierzchala RN CCM [facility] Care Management  [phone]

## 2015-06-21 ENCOUNTER — Telehealth: Payer: Self-pay | Admitting: Internal Medicine

## 2015-06-21 ENCOUNTER — Encounter: Payer: Self-pay | Admitting: Internal Medicine

## 2015-06-21 ENCOUNTER — Ambulatory Visit (INDEPENDENT_AMBULATORY_CARE_PROVIDER_SITE_OTHER): Payer: Medicare Other | Admitting: Internal Medicine

## 2015-06-21 VITALS — BP 118/58 | HR 74 | Ht 63.0 in | Wt 102.0 lb

## 2015-06-21 DIAGNOSIS — J438 Other emphysema: Secondary | ICD-10-CM

## 2015-06-21 NOTE — Progress Notes (Signed)
[facility]     Assessment and Plan:  Dyspnea.  --Multifactorial due to debility/deconditioning, age-related emphysematous changes, and CHF.  --. Her breathing has declined slightly, this is expected given her multiple health issues, and I suspect that it will continue to decline. -End-of-life issues were discussed today. Agree with her plan to enter into the VIP program at her senior apartment, with a goal of trying to keep her out of the hospital. -She reiterates today that her CODE STATUS is DO NOT RESUSCITATE, and tells me that "I am ready to go to heaven".  Pleural effusion due to congestive heart failure.  --Stable.   Emphysema.  -Seen on chest x-ray, suspect these are age-related changes. --Had some desats on RA, , the patient is restarted on oxygen. -The patient is asked to use her nebulizer machine twice daily, rather than just when necessary.  Chronic Bronchitis --Has chronic expectoration, stable.  Date: 06/21/2015  MRN# [id number] Laurie Larsen 02-08-1923   Laurie Larsen is a 82 y.o. old female seen in follow up for chief complaint of  Chief Complaint  Patient presents with  . Follow-up     HPI:   Patient is a 82 year old female with a history of congestive heart failure, atrial fibrillation, sick sinus syndrome status post pacemaker. She also has a history of a right-sided pleural effusion which was drained in July 2014. She was asked to follow-up in regards to her respiratory issues. She is noted to have some some age-related emphysematous changes seen on chest x-ray, and a stable pleural effusion.  At previous visit, she was started on 2 L of oxygen as her oxygen levels appeared to drop with ambulation. Currently she requires 2L with rest and 3L with activity.   Currently she feels that her breathing is "a little bit below par.". She also tells me today that "I am ready to go to heaven." She has a nebulizer at home, but she is not  using it regularly. She has her cholecystostomy bag in place, and is being drained regularly. She is currently living with her husband at senior apartment facility. We discussed briefly about the potential starting on hospice, but they are starting hospice, but she tells me they're starting a VIP program in their senior apartments which is similar, with a goal of keeping her out of the hospital. She also tells me that she is DO NOT RESUSCITATE.   Medication:   Outpatient Encounter Prescriptions as of 06/21/2015  Medication Sig  . acetaminophen (TYLENOL) 650 MG CR tablet Take 1,300 mg by mouth at bedtime as needed for pain.  [name] atenolol (TENORMIN) 25 MG tablet Take 1 tablet (25 mg total) by mouth 2 (two) times daily.  [name] atorvastatin (LIPITOR) 20 MG tablet Take 20 mg by mouth at bedtime.  [name] azithromycin (ZITHROMAX) 500 MG tablet Take 1 tablet (500 mg total) by mouth daily. X 3 more days (Patient not taking: Reported on 06/20/2015)  . cefdinir (OMNICEF) 300 MG capsule Take 1 capsule (300 mg total) by mouth 2 (two) times daily. X 5 more days  . diltiazem (CARDIZEM CD) 180 MG 24 hr capsule Take 180 mg by mouth daily.  [name] docusate sodium (COLACE) 100 MG capsule Take 1 capsule (100 mg total) by mouth 2 (two) times daily.  . dorzolamide-timolol (COSOPT) 22.3-6.8 MG/ML ophthalmic solution Place 1 drop into both eyes 2 (two) times daily.   . feeding supplement, ENSURE ENLIVE, (ENSURE ENLIVE) LIQD Take 237 mLs by mouth  2 (two) times daily with a meal.  . furosemide (LASIX) 20 MG tablet Take 1 tablet (20 mg total) by mouth 2 (two) times daily.  [name]. HYDROcodone-acetaminophen (NORCO) 10-325 MG tablet Take 1 tablet by mouth 3 (three) times daily as needed for moderate pain or severe pain.  [name]. ipratropium-albuterol (DUONEB) 0.5-2.5 (3) MG/3ML SOLN Take 3 mLs by nebulization every 6 (six) hours.  [name]. levothyroxine (SYNTHROID, LEVOTHROID) 112 MCG tablet Take 1 tablet (112 mcg total) by mouth daily.  . polyethylene glycol  (MIRALAX / GLYCOLAX) packet Take 17 g by mouth at bedtime.   . potassium chloride (K-DUR) 10 MEQ tablet Take 1 tablet (10 mEq total) by mouth daily.  . traMADol (ULTRAM) 50 MG tablet Take 50 mg by mouth 3 (three) times daily as needed for moderate pain. Reported on 06/20/2015  . Travoprost, BAK Free, (TRAVATAN) 0.004 % SOLN ophthalmic solution Place 1 drop into both eyes at bedtime.  . Vitamin D, Ergocalciferol, (DRISDOL) 50000 UNITS CAPS capsule Take 50,000 Units by mouth every 7 (seven) days. Pt takes on Saturday.  . warfarin (COUMADIN) 5 MG tablet Take 5 mg by mouth daily at 6 PM.   No facility-administered encounter medications on file as of 06/21/2015.     Allergies:  Amoxicillin; Cetylpyridinium; Digoxin; Levaquin; and Risedronate sodium  Review of Systems: Gen:  Denies  fever, sweats. HEENT: Denies blurred vision. Cvc:  No dizziness, chest pain or heaviness Resp:   Denies cough or sputum porduction. Gi: Denies swallowing difficulty, stomach pain. constipation, bowel incontinence Gu:  Denies bladder incontinence, burning urine Ext:   No Joint pain, stiffness. Skin: No skin rash, easy bruising. Endoc:  No polyuria, polydipsia. Psych: No depression, insomnia. Other:  All other systems were reviewed and found to be negative other than what is mentioned in the HPI.   Physical Examination:   VS: There were no vitals taken for this visit.  General Appearance: No distress  Neuro:without focal findings,  speech normal,  HEENT: PERRLA, EOM intact. Pulmonary: normal breath sounds, No wheezing.   CardiovascularNormal S1,S2.  No m/r/g.   Abdomen: Benign, Soft, non-tender. Renal:  No costovertebral tenderness  GU:  Not performed at this time. Endoc: No evident thyromegaly, no signs of acromegaly. Skin:   warm, no rash. Extremities: normal, no cyanosis, clubbing.   LABORATORY PANEL:   CBC  Recent Labs Lab 06/16/15 0539  WBC 7.9  HGB 10.8*  HCT 32.4*  PLT 214    ------------------------------------------------------------------------------------------------------------------  Chemistries   Recent Labs Lab 06/16/15 0539  NA 137  K 3.6  CL 102  CO2 28  GLUCOSE 92  BUN 35*  CREATININE 0.71  CALCIUM 8.3*   ------------------------------------------------------------------------------------------------------------------  Cardiac Enzymes No results for input(s): TROPONINI in the last 168 hours. ------------------------------------------------------------  RADIOLOGY:   No results found for this or any previous visit. Results for orders placed during the hospital encounter of 04/04/15  DG Chest 2 View   Narrative CLINICAL DATA:  Right-sided chest pain. Recent ultrasound-guided thoracentesis of right pleural effusion.  EXAM: CHEST  2 VIEW  COMPARISON:  Most recent radiographs 03/28/2015, chest CT 03/27/2015  FINDINGS: Dual lead left-sided pacemaker remains in place. Cardiomegaly is stable. No pneumothorax post recent thoracentesis. Persistent right basilar opacity, likely combination of pleural fluid and atelectasis. Improving left lung base aeration. Linear peripheral opacity in the left lower lung zone, unchanged, suggesting atelectasis. Upper lungs are clear. No pulmonary edema.  IMPRESSION: Right basilar opacity likely combination of pleural fluid and atelectasis. No new  abnormalities are seen.   Electronically Signed   By: Rubye Oaks M.D.   On: 04/04/2015 19:34    ------------------------------------------------------------------------------------------------------------------  Thank  you for allowing [facility] to assist in the care of your patient. Our recommendations are noted above.  Please contact us if we can be of further service.   Wells Guiles, MD.  [facility] Office Number: [phone]  Santiago Glad, M.D.  Stephanie Acre, M.D.  Billy Fischer, M.D

## 2015-06-21 NOTE — Patient Instructions (Signed)
Start oxygen, 2L at rest and 3L with activity.

## 2015-06-21 NOTE — Telephone Encounter (Signed)
Attempted to call patient back x3.  No answer.  Left a message to call me back.

## 2015-06-21 NOTE — Telephone Encounter (Signed)
HFU/ Pt was discharged from the hospital on 06/17/15. No appt avail to sch. Let me know where to sch appt. Dx was Congestive heart Failure.   Call pt @ [phone]. Thank you!

## 2015-06-22 ENCOUNTER — Telehealth: Payer: Self-pay

## 2015-06-22 NOTE — Telephone Encounter (Signed)
Hospital follow up appointment completed.

## 2015-06-22 NOTE — Telephone Encounter (Signed)
Reached patient today and she has agreed to come in the office for her hospital follow up on 07/03/15 at 1:00.  Scheduled.

## 2015-06-27 ENCOUNTER — Ambulatory Visit: Payer: Self-pay | Admitting: *Deleted

## 2015-06-27 LAB — PROTIME-INR: INR: 3.4 — AB (ref 0.9–1.1)

## 2015-06-28 ENCOUNTER — Telehealth: Payer: Self-pay | Admitting: Family

## 2015-06-28 ENCOUNTER — Ambulatory Visit: Payer: Medicare Other | Admitting: Family

## 2015-06-28 ENCOUNTER — Ambulatory Visit (INDEPENDENT_AMBULATORY_CARE_PROVIDER_SITE_OTHER): Payer: Medicare Other | Admitting: Cardiology

## 2015-06-28 DIAGNOSIS — I482 Chronic atrial fibrillation, unspecified: Secondary | ICD-10-CM

## 2015-06-28 NOTE — Telephone Encounter (Signed)
Patient did not show for her Heart Failure Clinic appointment on 06/28/15. Will attempt to reschedule.

## 2015-06-29 ENCOUNTER — Other Ambulatory Visit: Payer: Self-pay | Admitting: *Deleted

## 2015-06-29 NOTE — Patient Outreach (Signed)
Transition of care call (week 2, discharged 5/21).  Spoke with pt, reports still having problems breathing, using her oxygen 2/3 of the time, have places to go.  Pt reports they did bring one portable oxygen tank today, to use tonight when she goes down to dinner. Pt reports compliant with Lasix, miss once in a while, has some swelling, not extreme, elevate legs at night.  Pt reports she uses 2 pillows to sleep at night, takes her 2 hours to get to sleep.  RN CM discussed with pt to report this at upcoming MD visit (Dr. Dan HumphreysWalker 6/8).   Pt reports to f/u with Heart MD 6/12. Pt reports had to [facility].   As discussed with pt, plan to f/u again 6/8- home visit.    Shayne Alkenose M.   Danell Verno RN CCM [facility] Care Management  [phone]

## 2015-06-29 NOTE — Patient Outreach (Signed)
Transition of care call (week 2, discharged 5/21).  Spoke with spouse who reports pt is not here right now, to call back to which RN CM will do.       Shayne Alkenose M.   Kerem Gilmer RN CCM [facility] Care [facility]  [phone]

## 2015-07-02 DIAGNOSIS — J181 Lobar pneumonia, unspecified organism: Secondary | ICD-10-CM | POA: Diagnosis not present

## 2015-07-02 DIAGNOSIS — I110 Hypertensive heart disease with heart failure: Secondary | ICD-10-CM | POA: Diagnosis not present

## 2015-07-02 DIAGNOSIS — I482 Chronic atrial fibrillation: Secondary | ICD-10-CM | POA: Diagnosis not present

## 2015-07-02 DIAGNOSIS — I5033 Acute on chronic diastolic (congestive) heart failure: Secondary | ICD-10-CM | POA: Diagnosis not present

## 2015-07-03 ENCOUNTER — Encounter: Payer: Self-pay | Admitting: Internal Medicine

## 2015-07-03 ENCOUNTER — Ambulatory Visit (INDEPENDENT_AMBULATORY_CARE_PROVIDER_SITE_OTHER): Payer: Medicare Other | Admitting: Internal Medicine

## 2015-07-03 VITALS — BP 124/74 | HR 78 | Ht 63.0 in | Wt 91.2 lb

## 2015-07-03 DIAGNOSIS — J189 Pneumonia, unspecified organism: Secondary | ICD-10-CM | POA: Diagnosis not present

## 2015-07-03 DIAGNOSIS — J9621 Acute and chronic respiratory failure with hypoxia: Secondary | ICD-10-CM | POA: Diagnosis not present

## 2015-07-03 LAB — BASIC METABOLIC PANEL
BUN: 33 mg/dL — ABNORMAL HIGH (ref 6–23)
CALCIUM: 8.6 mg/dL (ref 8.4–10.5)
CO2: 25 mEq/L (ref 19–32)
CREATININE: 0.82 mg/dL (ref 0.40–1.20)
Chloride: 103 mEq/L (ref 96–112)
GFR: 69.56 mL/min (ref >60.00)
Glucose, Bld: 139 mg/dL — ABNORMAL HIGH (ref 70–99)
Potassium: 4.5 mEq/L (ref 3.5–5.1)
Sodium: 136 mEq/L (ref 135–145)

## 2015-07-03 LAB — CBC WITH DIFFERENTIAL/PLATELET
Basophils Absolute: 0 10*3/uL (ref 0.0–0.1)
Basophils Relative: 0.2 % (ref 0.0–3.0)
EOS PCT: 2.8 % (ref 0.0–5.0)
Eosinophils Absolute: 0.2 10*3/uL (ref 0.0–0.7)
HCT: 30.3 % — ABNORMAL LOW (ref 36.0–46.0)
Hemoglobin: 10.1 g/dL — ABNORMAL LOW (ref 12.0–15.0)
LYMPHS ABS: 0.5 10*3/uL — AB (ref 0.7–4.0)
Lymphocytes Relative: 8 % — ABNORMAL LOW (ref 12.0–46.0)
MCHC: 33.5 g/dL (ref 30.0–36.0)
MCV: 93.7 fl (ref 78.0–100.0)
MONOS PCT: 8.1 % (ref 3.0–12.0)
Monocytes Absolute: 0.6 10*3/uL (ref 0.1–1.0)
NEUTROS ABS: 5.5 10*3/uL (ref 1.4–7.7)
NEUTROS PCT: 80.9 % — AB (ref 43.0–77.0)
PLATELETS: 159 10*3/uL (ref 150.0–400.0)
RBC: 3.23 Mil/uL — ABNORMAL LOW (ref 3.87–5.11)
RDW: 15.4 % (ref 11.5–15.5)
WBC: 6.8 10*3/uL (ref 4.0–10.5)

## 2015-07-03 NOTE — Assessment & Plan Note (Addendum)
Secondary to CHF and pneumonia. Symptoms have improved after treatment for pneumonia. Reviewed notes from pulmonary physician. Will hold off on repeat CXR for now. Continue Duoneb. Follow up if any worsening dyspnea, cough, fever or other concerns.

## 2015-07-03 NOTE — Assessment & Plan Note (Signed)
Symptoms improved after recent episode of pneumonia and hospitalization. Will continue to monitor for now.

## 2015-07-03 NOTE — Progress Notes (Signed)
Subjective:    Patient ID: Laurie Larsen, female    DOB: 08-11-1923, 82 y.o.   MRN: [id number]  HPI  82YO female presents for hospital follow up.  ADMISSION: 06/13/2015 DISCHARGE: 06/17/2015 to facility, then from facility 06/20/2015  DIAGNOSIS: Shortness of breath with hypoxia, pneumonia.  Seen in clinic on 5/17, noted to be hypoxic. Transported to ER via EMS. CXR showed right sided pleural effusion and atelectasis. Treated for possible pneumonia and transitioned to oral Omnicef and Azithromycin at discharge. Lasix increased to bid.  Feeling better since hospital stay, however Saturday felt very fatigued. Some dyspnea at night. Using 2-3L [place] all the time. Home Health nurse has been visiting. Getting some assistance from husband with sponge baths. Appetite has been okay. Eating small portions.  Coumadin level has been checked weekly, because elevated with Abx. Completed antibiotics. No further fever. Cough improved.  Wt Readings from Last 3 Encounters:  07/03/15 91 lb 4 oz (41.391 kg)  06/21/15 102 lb (46.267 kg)  06/13/15 101 lb 8 oz (46.04 kg)   BP Readings from Last 3 Encounters:  07/03/15 124/74  06/21/15 118/58  06/17/15 144/62    Past Medical History  Diagnosis Date  . Permanent atrial fibrillation (HCC)     a. Chronic Coumadin.  [name] HTN (hypertension)   . Chronic diastolic CHF (congestive heart failure), NYHA class 1 (HCC)     a. 04/2013 Echo: EF 50-55%, triv MR, mildly dil LA, PASP , mild TR, mild-mod RA dil.  [name] CAD (coronary artery disease)     a. 2005 Cath: 100% dLCX-->Med managed.  . Sick sinus syndrome (HCC)     a. 05/2008 s/p MDT EnRhythm DC PPM ([facility]).  . Chronic pain     a. ? due to arthritis  . Glaucoma   . Hyperlipidemia   . Diverticulitis large intestine   . Allergy   . Phlebitis     a. after pacemeker placement in 2010.  [name] Colon polyps   . Hypothyroidism   . Transfusion history     a. After childbirth 60 years ago (1957)  . Rash/skin  eruption     a. Multiple episodes - wide distribution-intermittent, possibly drug rash.  . Pulmonary HTN (HCC)     a. PASP on echo 04/2013.  [name] Chronic Right Pleural Effusion     a. 07/2012 s/p thoracentesis - Followed by Dr. Delford Field [facility].  . Arthritis   . Bronchitis   . Hay fever   . Colon polyps   . History of blood transfusion   . Urine incontinence   . Back complaints   . Acalculous cholecystitis   . CHF (congestive heart failure) (HCC)    Family History  Problem Relation Age of Onset  . Cancer Mother     colon with mets  . Diabetes Mother   . Heart disease Father   . Hyperlipidemia Father   . Hypertension Father   . Heart attack Father   . Stroke Brother     brother who died 2022/06/28  . Cancer Paternal Aunt     breast  . Cancer Cousin     uterine, lung cancer  . Heart attack Brother    Past Surgical History  Procedure Laterality Date  . Appendectomy  1939  . Abdominal hysterectomy  1977  . Tonsillectomy  82 yrs old  . Kidney surgery  1955    Right  . Ankle fracture surgery  1979    Left, pin  .  Pacemaker insertion  06/20/08    MDT EnRhythm DR implanted by Dr Reyes IvanKersey  . Breast surgery      Needle guided excision, right breast calcification  . Breast fibroadenoma surgery  2010    Right  . Skin graft      left leg  . Breast biopsy  2011   Social History   Social History  . Marital Status: Married    Spouse Name: Lorella NimrodHarvey  . Number of Children: 3  . Years of Education: 13   Occupational History  . RETIRED    Social History Main Topics  . Smoking status: Never Smoker   . Smokeless tobacco: Never Used  . Alcohol Use: No  . Drug Use: No  . Sexual Activity: No   Other Topics Concern  . None   Social History Narrative   [place]. Three sons.      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   Work: [employer] and then [employer] dept - retired.     Married 1948.    3 sons - '52, '57, '58; 5  grandchildren-one deceased -  OD @ 3321.        End of Life Care: no prolonged heroic measures, i.e. Artificial feeding or hydration; DNR; no prolonged intubation.    Review of Systems  Constitutional: Positive for fatigue. Negative for fever, chills, appetite change and unexpected weight change.  Eyes: Negative for visual disturbance.  Respiratory: Positive for cough (occasional dry) and shortness of breath.   Cardiovascular: Negative for chest pain and leg swelling.  Gastrointestinal: Negative for nausea, vomiting, abdominal pain, diarrhea and constipation.  Musculoskeletal: Negative for myalgias, arthralgias and gait problem (using Xzaiver Vayda or wheelchair).  Skin: Negative for color change and rash.  Hematological: Negative for adenopathy. Does not bruise/bleed easily.  Psychiatric/Behavioral: Negative for sleep disturbance and dysphoric mood. The patient is not nervous/anxious.        Objective:    BP 124/74 mmHg  Pulse 78  Ht 5\' 3"  (1.6 m)  Wt 91 lb 4 oz (41.391 kg)  BMI 16.17 kg/m2  SpO2 91% Physical Exam  Constitutional: She is oriented to person, place, and time. She appears well-developed and well-nourished. No distress.  HENT:  Head: Normocephalic and atraumatic.  Right Ear: External ear normal.  Left Ear: External ear normal.  Nose: Nose normal.  Mouth/Throat: Oropharynx is clear and moist. No oropharyngeal exudate.  Eyes: Conjunctivae are normal. Pupils are equal, round, and reactive to light. Right eye exhibits no discharge. Left eye exhibits no discharge. No scleral icterus.  Neck: Normal range of motion. Neck supple. No tracheal deviation present. No thyromegaly present.  Cardiovascular: Normal rate, regular rhythm, normal heart sounds and intact distal pulses.  Exam reveals no gallop and no friction rub.   No murmur heard. Pulmonary/Chest: Breath sounds normal. Accessory muscle usage (with miimal exertion such as speaking in short sentences) present. Tachypnea noted.  No respiratory distress. She has no wheezes. She has no rhonchi. She has no rales. She exhibits no tenderness.  Musculoskeletal: Normal range of motion. She exhibits no edema or tenderness.  Lymphadenopathy:    She has no cervical adenopathy.  Neurological: She is alert and oriented to person, place, and time. No cranial nerve deficit. She exhibits normal muscle tone. Coordination normal.  Skin: Skin is warm and dry. No rash noted. She is not diaphoretic. No erythema. No pallor.  Psychiatric: She has a normal mood and affect. Her behavior  is normal. Judgment and thought content normal.          Assessment & Plan:   Problem List Items Addressed This Visit      Unprioritized   Acute on chronic respiratory failure (HCC) - Primary    Secondary to CHF and pneumonia. Symptoms have improved after treatment for pneumonia. Reviewed notes from pulmonary physician. Will hold off on repeat CXR for now. Continue Duoneb. Follow up if any worsening dyspnea, cough, fever or other concerns.      Pneumonia    Symptoms improved after recent episode of pneumonia and hospitalization. Will continue to monitor for now.       Relevant Orders   CBC w/Diff   Basic Metabolic Panel (BMET)       Return in about 4 weeks (around 07/31/2015) for Recheck.  Ronna Polio, MD Internal Medicine [facility]

## 2015-07-03 NOTE — Progress Notes (Signed)
Pre visit review using our clinic review tool, if applicable. No additional management support is needed unless otherwise documented below in the visit note. 

## 2015-07-03 NOTE — Patient Instructions (Signed)
Labs today.  Follow up in 4 weeks and sooner as needed. 

## 2015-07-05 ENCOUNTER — Telehealth: Payer: Self-pay

## 2015-07-05 ENCOUNTER — Other Ambulatory Visit: Payer: Self-pay | Admitting: *Deleted

## 2015-07-05 LAB — PROTIME-INR: INR: 2 — AB (ref 0.9–1.1)

## 2015-07-05 NOTE — Telephone Encounter (Signed)
Patient was informed of results.  Patient understood and no questions, comments, or concerns at this time.  

## 2015-07-06 ENCOUNTER — Ambulatory Visit (INDEPENDENT_AMBULATORY_CARE_PROVIDER_SITE_OTHER): Payer: Medicare Other

## 2015-07-06 ENCOUNTER — Encounter: Payer: Self-pay | Admitting: *Deleted

## 2015-07-06 DIAGNOSIS — I482 Chronic atrial fibrillation, unspecified: Secondary | ICD-10-CM

## 2015-07-06 NOTE — Patient Outreach (Signed)
[facility]) Care Management   Home visit 07/05/15  Laurie Larsen [birth year] [id number]  Laurie Larsen is an 82 y.o. female  Subjective:  Pt reports felt bad this am- weak, but since ate lunch felt better.  Pt reports sob is not better,  Keeping her oxygen on helps.  Pt reports f/u with Dr. Dan Humphreys 6/6, lab work done- showed anemic.  Pt reports f/u with Lung MD recently and he talked to her about Hospice but she wants to do the VIP program at her senior apartment, if that does not work out, will do Hospice.  Pt reports she completed her antibiotic, doing nebulizer  once a day.   Pt reports does not see need to go to both  HF clinic and Dr. Swaziland (repetitive), to see Dr. Swaziland 6/12.    Objective: Filed Vitals:   07/05/15 1646  BP: 124/60  Pulse: 66  Resp: 24      ROS  Physical Exam  Constitutional: She is oriented to person, place, and time.  Frail, thin   Cardiovascular: Regular rhythm and normal heart sounds.   Respiratory: Breath sounds normal.  On O2 2 L [place] at rest, 3 L  with activity.   GI: Soft.  Musculoskeletal: Normal range of motion.  Uses w/c for distances.    Neurological: She is alert and oriented to person, place, and time.  Skin: Skin is warm and dry.  Psychiatric: Her behavior is normal. Judgment and thought content normal.  Was teary eye short time- about decline in health     Encounter Medications:  Reviewed with pt  Outpatient Encounter Prescriptions as of 07/05/2015  Medication Sig Note  . acetaminophen (TYLENOL) 650 MG CR tablet Take 1,300 mg by mouth at bedtime as needed for pain. 07/05/2015: Pt takes arthritis Tylenol   . atenolol (TENORMIN) 25 MG tablet Take 1 tablet (25 mg total) by mouth 2 (two) times daily.   [name] atorvastatin (LIPITOR) 20 MG tablet Take 20 mg by mouth at bedtime.   [name] diltiazem (CARDIZEM CD) 180 MG 24 hr capsule Take 180 mg by mouth daily.   . dorzolamide-timolol (COSOPT) 22.3-6.8 MG/ML ophthalmic solution Place 1  drop into both eyes 2 (two) times daily.    . feeding supplement, ENSURE ENLIVE, (ENSURE ENLIVE) LIQD Take 237 mLs by mouth 2 (two) times daily with a meal. 07/05/2015: Taking one a day   . furosemide (LASIX) 20 MG tablet Take 1 tablet (20 mg total) by mouth 2 (two) times daily. 07/05/2015: Pt reports takes two 20 mg twice a day   . HYDROcodone-acetaminophen (NORCO) 10-325 MG tablet Take 1 tablet by mouth 3 (three) times daily as needed for moderate pain or severe pain.   [name] ipratropium-albuterol (DUONEB) 0.5-2.5 (3) MG/3ML SOLN Take 3 mLs by nebulization every 6 (six) hours.   [name] levothyroxine (SYNTHROID, LEVOTHROID) 112 MCG tablet Take 1 tablet (112 mcg total) by mouth daily.   . polyethylene glycol (MIRALAX / GLYCOLAX) packet Take 17 g by mouth at bedtime.    . potassium chloride (K-DUR) 10 MEQ tablet Take 1 tablet (10 mEq total) by mouth daily. 06/20/2015: Pt reports was taking twice a day, to take once per discharge orders.   . traMADol (ULTRAM) 50 MG tablet Take 50 mg by mouth 3 (three) times daily as needed for moderate pain. Reported on 06/20/2015   . Travoprost, BAK Free, (TRAVATAN) 0.004 % SOLN ophthalmic solution Place 1 drop into both eyes at bedtime.   [name]  Vitamin D, Ergocalciferol, (DRISDOL) 50000 UNITS CAPS capsule Take 50,000 Units by mouth every 7 (seven) days. Pt takes on Saturday.   . warfarin (COUMADIN) 5 MG tablet Take 5 mg by mouth daily at 6 PM. 06/20/2015: Pt reports was told to take two 5 mg tablets today, go back to one tomorrow   . docusate sodium (COLACE) 100 MG capsule Take 1 capsule (100 mg total) by mouth 2 (two) times daily. (Patient not taking: Reported on 07/05/2015) 06/20/2015: Only as needed.    No facility-administered encounter medications on file as of 07/05/2015.    Functional Status:   In your present state of health, do you have any difficulty performing the following activities: 06/13/2015 06/13/2015  Hearing? - N  Vision? - N  Difficulty concentrating or making  decisions? - N  Walking or climbing stairs? - Y  Dressing or bathing? - N  Doing errands, shopping? N -  Preparing Food and eating ? - -  Using the Toilet? - -  In the past six months, have you accidently leaked urine? - -  Do you have problems with loss of bowel control? - -  Managing your Medications? - -  Managing your Finances? - -  Housekeeping or managing your Housekeeping? - -    Fall/Depression Screening:    PHQ 2/9 Scores 07/03/2015 05/01/2015  PHQ - 2 Score 0 4  PHQ- 9 Score - 8    Assessment:  Pleasant 82 year old woman, resides in senior apartment with spouse (has Dementia).                             Upon arrival, pt returning from hair dresser appointment, portable oxygen at her side but not                         On.  Pt reapplied when she got back to her apartment, O2 saturation at rest was 93% on 2 Liter  [place].                          No edema, lungs clear, no c/o pain.    Plan:  As discussed, pt to wear O2 at all times.             Pt to f/u with Dr. Swaziland 6/12.           As discussed with pt, can do  Duoneb more often if needed.            Plan to send Dr. Dan Humphreys by in basket quarterly update letter, route 6/8 home visit encounter.          Plan to continue to follow pt for transition of care, f/u again telephonically 6/15.   THN CM Care Plan Problem One        Most Recent Value   Care Plan Problem One  Risk for readmission related to recent hospitalization - PNA    Role Documenting the Problem One  Care Management Coordinator   Care Plan for Problem One  Active   THN Long Term Goal (31-90 days)  Pt would not readmit 31 days post discharge.    THN Long Term Goal Start Date  06/20/15   Interventions for Problem One Long Term Goal  home visit done.    THN CM Short Term Goal #1 (0-30 days)  Pt would be compliant to  do Duoneb as ordered for the next 30 days     THN CM Short Term Goal #1 Start Date  06/20/15   Interventions for Short Term Goal #1  reviewed again  use of Duoneb as ordered, prn - can use more than once a day   THN CM Short Term Goal #3 (0-30 days)  Compliance with wearing Oxygen would improve in the next 14 days    THN CM Short Term Goal #3 Start Date  06/29/15   Interventions for Short Tern Goal #3  Reinforced with pt per discharge orders, to wear O2 at all times.       Shayne Alkenose M.   Pierzchala RN CCM [facility] Care Management  [phone]

## 2015-07-09 ENCOUNTER — Ambulatory Visit: Payer: Medicare Other

## 2015-07-09 ENCOUNTER — Telehealth: Payer: Self-pay | Admitting: Internal Medicine

## 2015-07-09 ENCOUNTER — Encounter: Payer: Self-pay | Admitting: Cardiology

## 2015-07-09 ENCOUNTER — Ambulatory Visit (INDEPENDENT_AMBULATORY_CARE_PROVIDER_SITE_OTHER): Payer: Medicare Other | Admitting: Cardiology

## 2015-07-09 ENCOUNTER — Other Ambulatory Visit: Payer: Self-pay | Admitting: Internal Medicine

## 2015-07-09 VITALS — BP 142/82 | HR 66 | Ht 64.0 in | Wt 104.0 lb

## 2015-07-09 DIAGNOSIS — I482 Chronic atrial fibrillation, unspecified: Secondary | ICD-10-CM

## 2015-07-09 DIAGNOSIS — I5032 Chronic diastolic (congestive) heart failure: Secondary | ICD-10-CM | POA: Diagnosis not present

## 2015-07-09 DIAGNOSIS — Z950 Presence of cardiac pacemaker: Secondary | ICD-10-CM

## 2015-07-09 DIAGNOSIS — I272 Other secondary pulmonary hypertension: Secondary | ICD-10-CM

## 2015-07-09 DIAGNOSIS — J90 Pleural effusion, not elsewhere classified: Secondary | ICD-10-CM

## 2015-07-09 DIAGNOSIS — Z7901 Long term (current) use of anticoagulants: Secondary | ICD-10-CM

## 2015-07-09 MED ORDER — LEVOTHYROXINE SODIUM 112 MCG PO TABS: 112.0000 ug | ORAL_TABLET | Freq: Every day | ORAL | Status: AC

## 2015-07-09 NOTE — Patient Instructions (Signed)
Weigh yourself daily  If your weight increases by more than 2 lbs in a day or 3 lbs in a week take extra lasix.  I will see you in 2 months.

## 2015-07-09 NOTE — Telephone Encounter (Signed)
Refill sent with a reminder note to keep the 08/17/15 appointment.

## 2015-07-09 NOTE — Progress Notes (Signed)
CARDIOLOGY OFFICE NOTE  Date:  07/09/2015    Laurie Larsen Date of Birth: 1923/10/24 Medical Record #[id number]  PCP:  Wynona DoveWALKER,JENNIFER AZBELL, MD  Cardiologist: Peter SwazilandJordan  MD  Chief Complaint  Patient presents with  . Follow-up     History of Present Illness: Laurie Larsen is a 82 y.o. female who presents today for  follow up diastolic CHF.  She has a hx of diastolic HF and chronic R pleural effusion, HLD, pulmonary HTN, chronic atrial fib, chronic coumadin therapy, HTN, CAD with known occlusion of a distal LCX branch from 2005, SSS with PPM in place, chronic pain, and hypothyroidism. She had pneumonia  in July of 2014 as well as right pleural effusion requiring thoracentesis - evaluation suggested a transudative process with some reactive mesothelial cells. Echo done in April 2015 showed normal LV and valvular function with moderate pulmonary HTN.    She has been admitted a number of times at [facility] with respiratory failure and CHF. [place] is [place] at [place].   In September 2016 she was admitted to [facility] with acute acalculous cholecystitis.  A percutaneous drain was placed and she was treated with antibiotics with resolution of symptoms.Drain has been left in long term since she is such a poor surgical candidate.    On follow up today she is seen with her son. She was admitted in March with influenza A and klebsiella PNA. She was readmitted in May with acute respiratory failure felt to be due to PNA and CHF. She was diuresed. DC weight 102 lbs. She is on chronic oxygen now. Still gets SOB when she goes to bed. Sometimes feels congestion in am. Has some fullness in her chest. She now has a standing DNR order.    Past Medical History  Diagnosis Date  . Permanent atrial fibrillation (HCC)     a. Chronic Coumadin.  [name]. HTN (hypertension)   . Chronic diastolic CHF (congestive heart failure), NYHA class 1 (HCC)     a.  04/2013 Echo: EF 50-55%, triv MR, mildly dil LA, PASP 48mmHg, mild TR, mild-mod RA dil.  [name]. CAD (coronary artery disease)     a. 2005 Cath: 100% dLCX-->Med managed.  . Sick sinus syndrome (HCC)     a. 05/2008 s/p MDT EnRhythm DC PPM ([facility]).  . Chronic pain     a. ? due to arthritis  . Glaucoma   . Hyperlipidemia   . Diverticulitis large intestine   . Allergy   . Phlebitis     a. after pacemeker placement in 2010.  [name]. Colon polyps   . Hypothyroidism   . Transfusion history     a. After childbirth 60 years ago (1957)  . Rash/skin eruption     a. Multiple episodes - wide distribution-intermittent, possibly drug rash.  . Pulmonary HTN (HCC)     a. PASP 48mmHg on echo 04/2013.  [name]. Chronic Right Pleural Effusion     a. 07/2012 s/p thoracentesis - Followed by Dr. Delford FieldWright [facility].  . Arthritis   . Bronchitis   . Hay fever   . Colon polyps   . History of blood transfusion   . Urine incontinence   . Back complaints   . Acalculous cholecystitis   . CHF (congestive heart failure) [facility](HCC)     Past Surgical History  Procedure Laterality Date  . Appendectomy  1939  . Abdominal hysterectomy  1977  . Tonsillectomy  82 yrs old  .  Kidney surgery  1955    Right  . Ankle fracture surgery  1979    Left, pin  . Pacemaker insertion  06/20/08    MDT EnRhythm DR implanted by Dr Reyes Ivan  . Breast surgery      Needle guided excision, right breast calcification  . Breast fibroadenoma surgery  2010    Right  . Skin graft      left leg  . Breast biopsy  2011     Medications: Current Outpatient Prescriptions  Medication Sig Dispense Refill  . acetaminophen (TYLENOL) 650 MG CR tablet Take 1,300 mg by mouth at bedtime as needed for pain.    [name] atenolol (TENORMIN) 25 MG tablet Take 1 tablet (25 mg total) by mouth 2 (two) times daily. 180 tablet 3  . atorvastatin (LIPITOR) 20 MG tablet Take 20 mg by mouth at bedtime.    [name] diltiazem (CARDIZEM CD) 180 MG 24 hr capsule Take 180 mg by mouth  daily.    [name] docusate sodium (COLACE) 100 MG capsule Take 1 capsule (100 mg total) by mouth 2 (two) times daily. 10 capsule 0  . dorzolamide-timolol (COSOPT) 22.3-6.8 MG/ML ophthalmic solution Place 1 drop into both eyes 2 (two) times daily.     . feeding supplement, ENSURE ENLIVE, (ENSURE ENLIVE) LIQD Take 237 mLs by mouth 2 (two) times daily with a meal. 237 mL 12  . furosemide (LASIX) 20 MG tablet Take 1 tablet (20 mg total) by mouth 2 (two) times daily. 60 tablet 2  . HYDROcodone-acetaminophen (NORCO) 10-325 MG tablet Take 1 tablet by mouth 3 (three) times daily as needed for moderate pain or severe pain. 30 tablet 0  . ipratropium-albuterol (DUONEB) 0.5-2.5 (3) MG/3ML SOLN Take 3 mLs by nebulization every 6 (six) hours. 360 mL 0  . levothyroxine (SYNTHROID, LEVOTHROID) 112 MCG tablet Take 1 tablet (112 mcg total) by mouth daily. 90 tablet 1  . polyethylene glycol (MIRALAX / GLYCOLAX) packet Take 17 g by mouth at bedtime.     . potassium chloride (K-DUR) 10 MEQ tablet Take 1 tablet (10 mEq total) by mouth daily. 7 tablet 0  . traMADol (ULTRAM) 50 MG tablet Take 50 mg by mouth 3 (three) times daily as needed for moderate pain. Reported on 06/20/2015    . Travoprost, BAK Free, (TRAVATAN) 0.004 % SOLN ophthalmic solution Place 1 drop into both eyes at bedtime.    . Vitamin D, Ergocalciferol, (DRISDOL) 50000 UNITS CAPS capsule Take 50,000 Units by mouth every 7 (seven) days. Pt takes on Saturday.    . warfarin (COUMADIN) 5 MG tablet Take 5 mg by mouth daily at 6 PM.     No current facility-administered medications for this visit.    Allergies: Allergies  Allergen Reactions  . Amoxicillin Other (See Comments)    Reaction:  Weakness Has patient had a PCN reaction causing immediate rash, facial/tongue/throat swelling, SOB or lightheadedness with hypotension: No Has patient had a PCN reaction causing severe rash involving mucus membranes or skin necrosis: No Has patient had a PCN reaction that  required hospitalization No Has patient had a PCN reaction occurring within the last 10 years: No If all of the above answers are "NO", then may proceed with Cephalosporin use.  . Cetylpyridinium Other (See Comments)    Reaction:  Weakness   . Digoxin Other (See Comments)    Reaction:  Weakness and dehydration  . Levaquin [Levofloxacin] Itching  . Risedronate Sodium Rash    Social History: The patient  reports that she has never smoked. She has never used smokeless tobacco. She reports that she does not drink alcohol or use illicit drugs.   Family History: The patient's family history includes Cancer in her cousin, mother, and paternal aunt; Diabetes in her mother; Heart attack in her brother and father; Heart disease in her father; Hyperlipidemia in her father; Hypertension in her father; Stroke in her brother.   Review of Systems: Please see the history of present illness.      All other systems are reviewed and negative.   Physical Exam: VS:  BP 142/82 mmHg  Pulse 66  Ht 5\' 4"  (1.626 m)  Wt 104 lb (47.174 kg)  BMI 17.84 kg/m2 .  BMI Body mass index is 17.84 kg/(m^2).  Wt Readings from Last 3 Encounters:  07/09/15 104 lb (47.174 kg)  07/05/15 95 lb (43.092 kg)  07/03/15 91 lb 4 oz (41.391 kg)    General: Pleasant. Chronically ill. Quite kyphotic but in no acute distress.  HEENT: Normal. Neck: Supple, mild JVD, no carotid bruits or masses noted.  Cardiac: Irregular irregular rhythm. Rate is ok. No murmurs, rubs, or gallops. No edema.  Respiratory:  Lungs with some bilateral crackles.  GI: Soft and nontender.  MS: No deformity or atrophy. Gait and ROM intact.Using a cane.  Skin: Warm and dry. Color is normal.  Neuro:  Strength and sensation are intact and no gross focal deficits noted.  Psych: Alert, appropriate and with normal affect.   LABORATORY DATA:  EKG:  EKG is not ordered today.   Lab Results  Component Value Date   WBC 6.8 07/03/2015   HGB 10.1* 07/03/2015    HCT 30.3* 07/03/2015   PLT 159.0 07/03/2015   GLUCOSE 139* 07/03/2015   CHOL 117 06/01/2014   TRIG 68 06/01/2014   HDL 43 06/01/2014   LDLCALC 60 06/01/2014   ALT 17 05/14/2015   AST 25 05/14/2015   NA 136 07/03/2015   K 4.5 07/03/2015   CL 103 07/03/2015   CREATININE 0.82 07/03/2015   BUN 33* 07/03/2015   CO2 25 07/03/2015   TSH 3.016 03/26/2015   INR 2.0* 07/05/2015    BNP (last 3 results)  Recent Labs  01/22/15 1233 03/25/15 0912 06/13/15 1445  BNP 1232.0* 1204.0* 976.0*    ProBNP (last 3 results) No results for input(s): PROBNP in the last 8760 hours.   Other Studies Reviewed Today:    Assessment/Plan: 1. Chronic diastolic HF - weight is about at her baseline. Typically 102-105 lbs. Weight recorded at 91 lbs last week but this seems out of line with other readings.   She understands to take extra lasix if weight increases or she has increased dyspnea. Stressed importance of  avoiding salt. Will follow up in 2 months.   2. Chronic atrial fibrillation: Rate controlled. Continue coumadin which she is tolerating.   3. Pulmonary HTN- moderate  4. Essential hypertension, benign: BP controlled on current regimen which includes Atenolol, Diltiazem.   5. 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 Now ERI.  She has minimal pacing at night so it is felt that benefit of replacement is minimal.   6. CAD - continue with medical management  7. HLD (hyperlipidemia): on statin therapy  8. Acalculous cholecystitis. Symptoms resolved. Chronic drain in place.  9. DNR. Spoke with son about her declining condition. He asked about Hospice and palliative care and I think it is appropriate to get them involved.   Current  medicines are reviewed with the patient today.  The patient does not have concerns regarding medicines other than what has been noted above.  The following changes have been made:  See above.  Labs/ tests  ordered today include:    No orders of the defined types were placed in this encounter.    Disposition:   FU 2 months.  Patient is agreeable to this plan and will call if any problems develop in the interim.   Signed: Peter Swaziland MD, [facility]    07/09/2015 11:42 AM

## 2015-07-09 NOTE — Telephone Encounter (Signed)
FYI, Pt will not be coming in for her appt today due to not feeling well and no transportation. Appt is still on the sch. Let me know if needing to cancel. Thank you!

## 2015-07-09 NOTE — Telephone Encounter (Signed)
Pt lvm stating that she needs a refill of her Synthroid. She only has 3-4 pills left. She states that the provider that prescribed them she hasn't seen in over 2 yrs.

## 2015-07-09 NOTE — Telephone Encounter (Signed)
Rescheduled.  Thank you! 

## 2015-07-12 ENCOUNTER — Other Ambulatory Visit: Payer: Self-pay | Admitting: *Deleted

## 2015-07-12 NOTE — Patient Outreach (Signed)
Attempt made to call pt for transition of care (discharged 5/21, week 4).   HIPPA compliant voice message left with contact name and number, if no response will try again.     Shayne Alkenose M.   Pierzchala RN CCM [facility] Care Management  [phone]

## 2015-07-17 ENCOUNTER — Ambulatory Visit (INDEPENDENT_AMBULATORY_CARE_PROVIDER_SITE_OTHER): Payer: Medicare Other

## 2015-07-17 VITALS — BP 130/70 | HR 68 | Temp 97.4°F | Resp 14 | Ht 62.5 in | Wt 107.8 lb

## 2015-07-17 DIAGNOSIS — Z0000 Encounter for general adult medical examination without abnormal findings: Secondary | ICD-10-CM

## 2015-07-17 NOTE — Patient Instructions (Addendum)
Laurie Larsen , Thank you for taking time to come for your Medicare Wellness Visit. I appreciate your ongoing commitment to your health goals. Please review the following plan we discussed and let me know if I can assist you in the future.   These are the goals we discussed: Goals    . increase water intake     STAY HYDRATED AND DRINK PLENTY OF WATER. STAY ACTIVE AND CONTINUE WALKING AS MUCH AS POSSIBLE. EAT A HEALTHY DIET.       This is a list of the screening recommended for you and due dates:  Health Maintenance  Topic Date Due  . Flu Shot  08/28/2015  . Tetanus Vaccine  02/27/2018  . DEXA scan (bone density measurement)  Completed  . Shingles Vaccine  Addressed  . Pneumonia vaccines  Completed    Health Maintenance, Female Adopting a healthy lifestyle and getting preventive care can go a long way to promote health and wellness. Talk with your health care provider about what schedule of regular examinations is right for you. This is a good chance for you to check in with your provider about disease prevention and staying healthy. In between checkups, there are plenty of things you can do on your own. Experts have done a lot of research about which lifestyle changes and preventive measures are most likely to keep you healthy. Ask your health care provider for more information. WEIGHT AND DIET  Eat a healthy diet  Be sure to include plenty of vegetables, fruits, low-fat dairy products, and lean protein.  Do not eat a lot of foods high in solid fats, added sugars, or salt.  Get regular exercise. This is one of the most important things you can do for your health.  Most adults should exercise for at least 150 minutes each week. The exercise should increase your heart rate and make you sweat (moderate-intensity exercise).  Most adults should also do strengthening exercises at least twice a week. This is in addition to the moderate-intensity exercise.  Maintain a healthy  weight  Body mass index (BMI) is a measurement that can be used to identify possible weight problems. It estimates body fat based on height and weight. Your health care provider can help determine your BMI and help you achieve or maintain a healthy weight.  For females 63 years of age and older:   A BMI below 18.5 is considered underweight.  A BMI of 18.5 to 24.9 is normal.  A BMI of 25 to 29.9 is considered overweight.  A BMI of 30 and above is considered obese.  Watch levels of cholesterol and blood lipids  You should start having your blood tested for lipids and cholesterol at 80 years of age, then have this test every 5 years.  You may need to have your cholesterol levels checked more often if:  Your lipid or cholesterol levels are high.  You are older than 80 years of age.  You are at high risk for heart disease.  CANCER SCREENING   Lung Cancer  Lung cancer screening is recommended for adults 98-80 years old who are at high risk for lung cancer because of a history of smoking.  A yearly low-dose CT scan of the lungs is recommended for people who:  Currently smoke.  Have quit within the past 15 years.  Have at least a 30-pack-year history of smoking. A pack year is smoking an average of one pack of cigarettes a day for 1 year.  Yearly  screening should continue until it has been 15 years since you quit.  Yearly screening should stop if you develop a health problem that would prevent you from having lung cancer treatment.  Breast Cancer  Practice breast self-awareness. This means understanding how your breasts normally appear and feel.  It also means doing regular breast self-exams. Let your health care provider know about any changes, no matter how small.  If you are in your 20s or 30s, you should have a clinical breast exam (CBE) by a health care provider every 1-3 years as part of a regular health exam.  If you are 69 or older, have a CBE every year. Also  consider having a breast X-ray (mammogram) every year.  If you have a family history of breast cancer, talk to your health care provider about genetic screening.  If you are at high risk for breast cancer, talk to your health care provider about having an MRI and a mammogram every year.  Breast cancer gene (BRCA) assessment is recommended for women who have family members with BRCA-related cancers. BRCA-related cancers include:  Breast.  Ovarian.  Tubal.  Peritoneal cancers.  Results of the assessment will determine the need for genetic counseling and BRCA1 and BRCA2 testing. Cervical Cancer Your health care provider may recommend that you be screened regularly for cancer of the pelvic organs (ovaries, uterus, and vagina). This screening involves a pelvic examination, including checking for microscopic changes to the surface of your cervix (Pap test). You may be encouraged to have this screening done every 3 years, beginning at age 82.  For women ages 28-65, health care providers may recommend pelvic exams and Pap testing every 3 years, or they may recommend the Pap and pelvic exam, combined with testing for human papilloma virus (HPV), every 5 years. Some types of HPV increase your risk of cervical cancer. Testing for HPV may also be done on women of any age with unclear Pap test results.  Other health care providers may not recommend any screening for nonpregnant women who are considered low risk for pelvic cancer and who do not have symptoms. Ask your health care provider if a screening pelvic exam is right for you.  If you have had past treatment for cervical cancer or a condition that could lead to cancer, you need Pap tests and screening for cancer for at least 20 years after your treatment. If Pap tests have been discontinued, your risk factors (such as having a new sexual partner) need to be reassessed to determine if screening should resume. Some women have medical problems that  increase the chance of getting cervical cancer. In these cases, your health care provider may recommend more frequent screening and Pap tests. Colorectal Cancer  This type of cancer can be detected and often prevented.  Routine colorectal cancer screening usually begins at 80 years of age and continues through 80 years of age.  Your health care provider may recommend screening at an earlier age if you have risk factors for colon cancer.  Your health care provider may also recommend using home test kits to check for hidden blood in the stool.  A small camera at the end of a tube can be used to examine your colon directly (sigmoidoscopy or colonoscopy). This is done to check for the earliest forms of colorectal cancer.  Routine screening usually begins at age 42.  Direct examination of the colon should be repeated every 5-10 years through 80 years of age. However, you may  need to be screened more often if early forms of precancerous polyps or small growths are found. Skin Cancer  Check your skin from head to toe regularly.  Tell your health care provider about any new moles or changes in moles, especially if there is a change in a mole's shape or color.  Also tell your health care provider if you have a mole that is larger than the size of a pencil eraser.  Always use sunscreen. Apply sunscreen liberally and repeatedly throughout the day.  Protect yourself by wearing long sleeves, pants, a wide-brimmed hat, and sunglasses whenever you are outside. HEART DISEASE, DIABETES, AND HIGH BLOOD PRESSURE   High blood pressure causes heart disease and increases the risk of stroke. High blood pressure is more likely to develop in:  People who have blood pressure in the high end of the normal range (130-139/85-89 mm Hg).  People who are overweight or obese.  People who are African American.  If you are 41-12 years of age, have your blood pressure checked every 3-5 years. If you are 68 years of  age or older, have your blood pressure checked every year. You should have your blood pressure measured twice--once when you are at a hospital or clinic, and once when you are not at a hospital or clinic. Record the average of the two measurements. To check your blood pressure when you are not at a hospital or clinic, you can use:  An automated blood pressure machine at a pharmacy.  A home blood pressure monitor.  If you are between 38 years and 74 years old, ask your health care provider if you should take aspirin to prevent strokes.  Have regular diabetes screenings. This involves taking a blood sample to check your fasting blood sugar level.  If you are at a normal weight and have a low risk for diabetes, have this test once every three years after 80 years of age.  If you are overweight and have a high risk for diabetes, consider being tested at a younger age or more often. PREVENTING INFECTION  Hepatitis B  If you have a higher risk for hepatitis B, you should be screened for this virus. You are considered at high risk for hepatitis B if:  You were born in a country where hepatitis B is common. Ask your health care provider which countries are considered high risk.  Your parents were born in a high-risk country, and you have not been immunized against hepatitis B (hepatitis B vaccine).  You have HIV or AIDS.  You use needles to inject street drugs.  You live with someone who has hepatitis B.  You have had sex with someone who has hepatitis B.  You get hemodialysis treatment.  You take certain medicines for conditions, including cancer, organ transplantation, and autoimmune conditions. Hepatitis C  Blood testing is recommended for:  Everyone born from [birth year] through 1963.  Anyone with known risk factors for hepatitis C. Sexually transmitted infections (STIs)  You should be screened for sexually transmitted infections (STIs) including gonorrhea and chlamydia if:  You are  sexually active and are younger than 80 years of age.  You are older than 80 years of age and your health care provider tells you that you are at risk for this type of infection.  Your sexual activity has changed since you were last screened and you are at an increased risk for chlamydia or gonorrhea. Ask your health care provider if you are at risk.  If you do not have HIV, but are at risk, it may be recommended that you take a prescription medicine daily to prevent HIV infection. This is called pre-exposure prophylaxis (PrEP). You are considered at risk if:  You are sexually active and do not regularly use condoms or know the HIV status of your partner(s).  You take drugs by injection.  You are sexually active with a partner who has HIV. Talk with your health care provider about whether you are at high risk of being infected with HIV. If you choose to begin PrEP, you should first be tested for HIV. You should then be tested every 3 months for as long as you are taking PrEP.  PREGNANCY   If you are premenopausal and you may become pregnant, ask your health care provider about preconception counseling.  If you may become pregnant, take 400 to 800 micrograms (mcg) of folic acid every day.  If you want to prevent pregnancy, talk to your health care provider about birth control (contraception). OSTEOPOROSIS AND MENOPAUSE   Osteoporosis is a disease in which the bones lose minerals and strength with aging. This can result in serious bone fractures. Your risk for osteoporosis can be identified using a bone density scan.  If you are 88 years of age or older, or if you are at risk for osteoporosis and fractures, ask your health care provider if you should be screened.  Ask your health care provider whether you should take a calcium or vitamin D supplement to lower your risk for osteoporosis.  Menopause may have certain physical symptoms and risks.  Hormone replacement therapy may reduce some  of these symptoms and risks. Talk to your health care provider about whether hormone replacement therapy is right for you.  HOME CARE INSTRUCTIONS   Schedule regular health, dental, and eye exams.  Stay current with your immunizations.   Do not use any tobacco products including cigarettes, chewing tobacco, or electronic cigarettes.  If you are pregnant, do not drink alcohol.  If you are breastfeeding, limit how much and how often you drink alcohol.  Limit alcohol intake to no more than 1 drink per day for nonpregnant women. One drink equals 12 ounces of beer, 5 ounces of wine, or 1 ounces of hard liquor.  Do not use street drugs.  Do not share needles.  Ask your health care provider for help if you need support or information about quitting drugs.  Tell your health care provider if you often feel depressed.  Tell your health care provider if you have ever been abused or do not feel safe at home.   This information is not intended to replace advice given to you by your health care provider. Make sure you discuss any questions you have with your health care provider.   Document Released: 07/29/2010 Document Revised: 02/03/2014 Document Reviewed: 12/15/2012 Elsevier Interactive Patient Education [employer].

## 2015-07-17 NOTE — Progress Notes (Signed)
Care was provided under my supervision. I agree with the management as indicated in the note.  Hatsue Sime DO  

## 2015-07-17 NOTE — Progress Notes (Signed)
Subjective:   Laurie Larsen is a 82 y.o. female who presents for an Initial Medicare Annual Wellness Visit.  Review of Systems    No ROS.  Medicare Wellness Visit.  Cardiac Risk Factors include: hypertension     Objective:    Today's Vitals   07/17/15 1402 07/17/15 1403  BP: 130/70   Pulse: 68   Temp: 97.4 F (36.3 C)   TempSrc: Oral   Resp: 14   Height: 5' 2.5" (1.588 m)   Weight: 107 lb 12.8 oz (48.898 kg)   SpO2: 95%   PainSc:  2    Body mass index is 19.39 kg/(m^2).   Current Medications (verified) Outpatient Encounter Prescriptions as of 07/17/2015  Medication Sig  . acetaminophen (TYLENOL) 650 MG CR tablet Take 1,300 mg by mouth at bedtime as needed for pain.  [name] atenolol (TENORMIN) 25 MG tablet Take 1 tablet (25 mg total) by mouth 2 (two) times daily.  [name] atorvastatin (LIPITOR) 20 MG tablet Take 20 mg by mouth at bedtime.  [name] diltiazem (CARDIZEM CD) 180 MG 24 hr capsule Take 180 mg by mouth daily.  [name] docusate sodium (COLACE) 100 MG capsule Take 1 capsule (100 mg total) by mouth 2 (two) times daily.  . dorzolamide-timolol (COSOPT) 22.3-6.8 MG/ML ophthalmic solution Place 1 drop into both eyes 2 (two) times daily.   . feeding supplement, ENSURE ENLIVE, (ENSURE ENLIVE) LIQD Take 237 mLs by mouth 2 (two) times daily with a meal.  . furosemide (LASIX) 20 MG tablet Take 1 tablet (20 mg total) by mouth 2 (two) times daily.  [name] HYDROcodone-acetaminophen (NORCO) 10-325 MG tablet Take 1 tablet by mouth 3 (three) times daily as needed for moderate pain or severe pain.  [name] ipratropium-albuterol (DUONEB) 0.5-2.5 (3) MG/3ML SOLN Take 3 mLs by nebulization every 6 (six) hours.  [name] levothyroxine (SYNTHROID, LEVOTHROID) 112 MCG tablet Take 1 tablet (112 mcg total) by mouth daily.  . polyethylene glycol (MIRALAX / GLYCOLAX) packet Take 17 g by mouth at bedtime.   . potassium chloride (K-DUR) 10 MEQ tablet Take 1 tablet (10 mEq total) by mouth daily.  . traMADol (ULTRAM) 50 MG tablet  Take 50 mg by mouth 3 (three) times daily as needed for moderate pain. Reported on 06/20/2015  . Travoprost, BAK Free, (TRAVATAN) 0.004 % SOLN ophthalmic solution Place 1 drop into both eyes at bedtime.  . Vitamin D, Ergocalciferol, (DRISDOL) 50000 UNITS CAPS capsule Take 50,000 Units by mouth every 7 (seven) days. Pt takes on Saturday.  . warfarin (COUMADIN) 5 MG tablet Take 5 mg by mouth daily at 6 PM.   No facility-administered encounter medications on file as of 07/17/2015.    Allergies (verified) Amoxicillin; Cetylpyridinium; Digoxin; Levaquin; and Risedronate sodium   History: Past Medical History  Diagnosis Date  . Permanent atrial fibrillation (HCC)     a. Chronic Coumadin.  [name] HTN (hypertension)   . Chronic diastolic CHF (congestive heart failure), NYHA class 1 (HCC)     a. 04/2013 Echo: EF 50-55%, triv MR, mildly dil LA, PASP , mild TR, mild-mod RA dil.  [name] CAD (coronary artery disease)     a. 2005 Cath: 100% dLCX-->Med managed.  . Sick sinus syndrome (HCC)     a. 05/2008 s/p MDT EnRhythm DC PPM (Allred).  . Chronic pain     a. ? due to arthritis  . Glaucoma   . Hyperlipidemia   . Diverticulitis large intestine   . Allergy   . Phlebitis  a. after pacemeker placement in 2010.  [name]. Colon polyps   . Hypothyroidism   . Transfusion history     a. After childbirth 60 years ago (1957)  . Rash/skin eruption     a. Multiple episodes - wide distribution-intermittent, possibly drug rash.  . Pulmonary HTN (HCC)     a. PASP 48mmHg on echo 04/2013.  [name]. Chronic Right Pleural Effusion     a. 07/2012 s/p thoracentesis - Followed by Dr. Delford [facility]Wright [facility].  . Arthritis   . Bronchitis   . Hay fever   . Colon polyps   . History of blood transfusion   . Urine incontinence   . Back complaints   . Acalculous cholecystitis   . CHF (congestive heart failure) [facility](HCC)    Past Surgical History  Procedure Laterality Date  . Appendectomy  1939  . Abdominal hysterectomy  1977    . Tonsillectomy  82 yrs old  . Kidney surgery  1955    Right  . Ankle fracture surgery  1979    Left, pin  . Pacemaker insertion  06/20/08    MDT EnRhythm DR implanted by Dr Reyes IvanKersey  . Breast surgery      Needle guided excision, right breast calcification  . Breast fibroadenoma surgery  2010    Right  . Skin graft      left leg  . Breast biopsy  2011   Family History  Problem Relation Age of Onset  . Cancer Mother     colon with mets  . Diabetes Mother   . Heart disease Father   . Hyperlipidemia Father   . Hypertension Father   . Heart attack Father   . Stroke Brother     brother who died '11  . Cancer Paternal Aunt     breast  . Cancer Cousin     uterine, lung cancer  . Heart attack Brother    Social History   Occupational History  . RETIRED    Social History Main Topics  . Smoking status: Never Smoker   . Smokeless tobacco: Never Used  . Alcohol Use: No  . Drug Use: No  . Sexual Activity: No    Tobacco Counseling Counseling given: Not Answered   Activities of Daily Living In your present state of health, do you have any difficulty performing the following activities: 07/17/2015 06/13/2015  Hearing? Y -  Vision? N -  Difficulty concentrating or making decisions? N -  Walking or climbing stairs? Y -  Dressing or bathing? Y -  Doing errands, shopping? Y N  Preparing Food and eating ? N -  Using the Toilet? N -  In the past six months, have you accidently leaked urine? N -  Do you have problems with loss of bowel control? N -  Managing your Medications? N -  Managing your Finances? Y -  Housekeeping or managing your Housekeeping? Y -    Immunizations and Health Maintenance Immunization History  Administered Date(s) Administered  . Influenza Split 11/16/2012  . Influenza,inj,Quad PF,36+ Mos 09/28/2013, 10/18/2014  . PPD Test 10/22/2010  . Pneumococcal Conjugate-13 02/20/2015  . Pneumococcal-Unspecified 01/28/2011, 02/19/2014   There are no  preventive care reminders to display for this patient.  Patient Care Team: Shelia MediaJennifer A Walker, MD as PCP - General (Internal Medicine) Peter M SwazilandJordan, MD (Cardiology) Hillis RangeJames Allred, MD (Cardiology) Sharrell KuJeffrey Medoff, MD (Gastroenterology) Melvenia NeedlesLeon Cashwell, MD (Ophthalmology) Cherlyn RobertsFrederick Lupton, MD (Dermatology) Sheran Luzichard Ramos, MD (Physical Medicine and Rehabilitation) Catha GosselinKevin Little, MD  as Attending Physician (Family Medicine) Della Goo, RN as [facility] Care Management  Indicate any recent Medical Services you may have received from other than [facility] providers in the past year (date may be approximate).     Assessment:   This is a routine wellness examination for [facility]. The goal of the wellness visit is to assist the patient how to close the gaps in care and create a preventative care plan for the patient.   Taking VIT D as appropriate/Osteoporosis risk reviewed.    Medications reviewed; taking without issues or barriers.  Safety issues reviewed; smoke detectors in the home. No firearms in the home. Wears seatbelts when riding with others. No violence in the home.  No identified risk were noted; The patient was oriented x 3; appropriate in dress and manner and no objective failures at ADL's or IADL's.   Health maintenance gap; closed.  Patient Concerns:  None at this time.  Follow up with PCP if needed.  Hearing/Vision screen Hearing Screening Comments: Difficulty hearing a whisper Audiology deferred, per patient request Vision Screening Comments: Followed by [facility], Dr. Inez Pilgrim Wears glasses  Last OV 04/2015 Quarterly visits Macular degeneration Bilateral cataracts removed  Dietary issues and exercise activities discussed: Current Exercise Habits: Home exercise routine, Type of exercise: walking, Time (Minutes): 10, Frequency (Times/Week): 6, Weekly Exercise (Minutes/Week): 60, Intensity: Mild  Goals    . increase water intake      STAY HYDRATED AND DRINK PLENTY OF WATER. STAY ACTIVE AND CONTINUE WALKING AS MUCH AS POSSIBLE. EAT A HEALTHY DIET AND CONTINUE DRINKING ENSURE AS DIRECTED BY PHYSICIAN.      Depression Screen PHQ 2/9 Scores 07/17/2015 07/03/2015 05/01/2015  PHQ - 2 Score 0 0 4  PHQ- 9 Score - - 8    Fall Risk Fall Risk  07/17/2015 07/03/2015 05/01/2015  Falls in the past year? No No No  Risk for fall due to : - - Other (Comment)  Risk for fall due to (comments): - - use walker becasue of back- help straighter     Cognitive Function: MMSE - Mini Mental State Exam 07/17/2015  Orientation to time 5  Orientation to Place 5  Registration 3  Attention/ Calculation 5  Recall 3  Language- name 2 objects 2  Language- repeat 1  Language- follow 3 step command 3  Language- read & follow direction 1  Write a sentence 1  Copy design 1  Total score 30    Screening Tests Health Maintenance  Topic Date Due  . INFLUENZA VACCINE  08/28/2015  . TETANUS/TDAP  02/27/2018  . DEXA SCAN  Completed  . ZOSTAVAX  Addressed  . PNA vac Low Risk Adult  Completed      Plan:   End of life planning; Advance aging; Advanced directives discussed. DNR on file.  During the course of the visit, Marnisha was educated and counseled about the following appropriate screening and preventive services:   Vaccines to include Pneumoccal, Influenza, Hepatitis B, Td, Zostavax, HCV  Electrocardiogram  Cardiovascular disease screening  Colorectal cancer screening  Bone density screening  Diabetes screening  Glaucoma screening  Mammography/PAP  Nutrition counseling  Smoking cessation counseling  Patient Instructions (the written plan) were given to the patient.    Ashok Pall, LPN   [id number]/61/0960

## 2015-07-18 ENCOUNTER — Other Ambulatory Visit: Payer: Self-pay | Admitting: *Deleted

## 2015-07-18 NOTE — Patient Outreach (Signed)
[facility]) Care Management [facility] Community CM Telephone Outreach, Transition of Care, week 4 07/18/2015  Cecil CobbsMargaret N Ohlinger 1923-08-25 [id number]  Successful telephone outreach to Laurie ClinesMargaret Larsen, 82 y/o female, [facility] for transition of care after recent IP hospitalization.  Today, Ms. Laurie Larsen reports that she has "been doing just like I always have; I am okay I guess."  Patient reports that she has experienced persistent back pain, "no worse than it has been," and states that she is taking prescribed tramadol and hydrocodone "which helps."  Ms. Laurie Larsen states that she has been wearing her home O2 "most of the time."  Patient also reports that she "is probably not eating as much or drinking as much water as they want me to."  Mrs. Laurie Larsen reports that she went to MD appointment yesterday, stating, "I was seen by some girl who just asked me a bunch of crazy questions," and reports that "no changes" were made as a result of that doctor's appointment.  Mrs. Laurie Larsen denies questions, concerns, needs, and issues today.  I communicated with her that I would make Jodi Mourningose Pierzchala, [facility] Community RN CM aware of our conversation today, and that Okey DupreRose would follow up with the patient for continued [facility] Community CM outreach in the future.  Plan:  Will communicate successful telephone outreach to patient today to Jodi Mourningose Pierzchala, [facility] RN CM for further [facility] Community CM outreach.   Caryl PinaLaine Mckinney Tousey, RN, BSN, [employer] Community Care Coordinator [facility] Care Management  [phone]

## 2015-07-19 LAB — PROTIME-INR: INR: 3.2 — AB (ref 0.9–1.1)

## 2015-07-20 ENCOUNTER — Ambulatory Visit (INDEPENDENT_AMBULATORY_CARE_PROVIDER_SITE_OTHER): Payer: Medicare Other | Admitting: Cardiovascular Disease

## 2015-07-20 DIAGNOSIS — I482 Chronic atrial fibrillation, unspecified: Secondary | ICD-10-CM

## 2015-07-25 ENCOUNTER — Telehealth: Payer: Self-pay | Admitting: *Deleted

## 2015-07-25 NOTE — Telephone Encounter (Signed)
Would you take her on as your patient

## 2015-07-25 NOTE — Telephone Encounter (Signed)
See other note

## 2015-07-25 NOTE — Telephone Encounter (Signed)
Patient has requested to have Dr.Scott as her PCP.

## 2015-07-26 ENCOUNTER — Other Ambulatory Visit: Payer: Self-pay | Admitting: *Deleted

## 2015-07-26 NOTE — Patient Outreach (Signed)
Attempt made to contact pt on home phone, schedule home visit (continue community nurse case management services). Note- final transition of care call was made by coworker Su HiltLaine RN CM (covering for this RN CM) 6/21.    Unable to leave a voice message on pt's home phone so  attempt was made to contact her on mobile number, HIPAA compliant voice message left with contact number.  If no response, will try again.    Shayne Alkenose M.   Pierzchala RN CCM [facility] Care Management  [phone]

## 2015-07-27 ENCOUNTER — Telehealth: Payer: Self-pay | Admitting: Internal Medicine

## 2015-07-27 NOTE — Telephone Encounter (Signed)
Pt son calling asking if we can please send in a recommendation for Hospice. He would like to know a little of what Doctor would recommend before doing this. He is just wanting to know what needs to be done to help patient.  Please advise.   He also wants to set up an meeting with Doctor about patient's upcoming care.  Would like to also set that up

## 2015-07-27 NOTE — Telephone Encounter (Signed)
Please advise on message and if you want me to schedule an appt for you to talk with son. Thanks

## 2015-07-30 ENCOUNTER — Other Ambulatory Visit: Payer: Self-pay | Admitting: *Deleted

## 2015-07-30 NOTE — Patient Outreach (Signed)
Follow up phone call.  Spoke with pt, HIPAA verified.  Pt reports doing like usual, Home health still coming. Pt reports breathing the same, has not f/u with Hospice, now has VIP.   As discussed, RN CM to f/u again 7/11- home visit (continue to provide community nurse case management services).      Shayne Alkenose M.   Ellieanna Funderburg RN CCM [facility] Care Management  [phone]

## 2015-08-01 NOTE — Telephone Encounter (Signed)
Called but got husband, son was not available. Can set up an OV visit with patient and son, but pt has to be present. Otherwise can perform phone discussion.  Yes I would recommend hospice for patient nothing else will need to be done before that.

## 2015-08-01 NOTE — Telephone Encounter (Signed)
Spoke with son Frazier ButtGary Leibensperger and he ask for you to call [phone] first and then if need be to call [phone]. I informed him I couldn't give a time but you would try to call them tomorrow afternoon.

## 2015-08-02 ENCOUNTER — Other Ambulatory Visit: Payer: Self-pay | Admitting: Cardiology

## 2015-08-02 NOTE — Telephone Encounter (Signed)
Rx(s) sent to pharmacy electronically.  

## 2015-08-02 NOTE — Telephone Encounter (Signed)
Spoke with son and explained pt's condition and that she is hospice appropriate also explained what hospice is and why it would be helpful for her.  He is going to discuss this with family. Explained that if pt requires a referred from us to go into hospice we would be happy to provide this, she would not need to come back in for another OV.

## 2015-08-03 NOTE — Telephone Encounter (Signed)
Will close note due to DR talking with family.

## 2015-08-07 ENCOUNTER — Other Ambulatory Visit: Payer: Self-pay | Admitting: *Deleted

## 2015-08-07 NOTE — Patient Outreach (Signed)
[facility]) Care Management   08/07/2015  Laurie Larsen [birth year] [id number]  Laurie Larsen is an 82 y.o. female  Subjective: Pt reports still has breathing issues, getting more sob, wears oxygen 2/3 of time.  Pt reports  Back hurts a lot, took pain medication earlier, no pain now.  Pt reports she started back on the VIP  Program provided by facility because she needs closer monitoring, has more MD appointments.  Pt reports last visit with Dr. Nicholos Johns, MD mentioned Hospice but did not fully understand  Their services, does not feel has a good quality of life now.    Objective:   Filed Vitals:   08/07/15 1206  BP: 122/80  Pulse: 85  Resp: 24    ROS  Physical Exam  Constitutional: She is oriented to person, place, and time.  Thin, frail looking   Cardiovascular: Normal rate.   Slight irregularity.   Respiratory: Effort normal and breath sounds normal.  Pt on O2 2LNC   GI: Soft.  Musculoskeletal: Normal range of motion. She exhibits edema.  Trace edema top of both feet   Neurological: She is alert and oriented to person, place, and time.  Skin: Skin is warm and dry.  Psychiatric: She has a normal mood and affect. Her behavior is normal. Judgment and thought content normal.    Encounter Medications:   Outpatient Encounter Prescriptions as of 08/07/2015  Medication Sig Note  . acetaminophen (TYLENOL) 650 MG CR tablet Take 1,300 mg by mouth at bedtime as needed for pain. 07/05/2015: Pt takes arthritis Tylenol   . atenolol (TENORMIN) 25 MG tablet Take 1 tablet (25 mg total) by mouth 2 (two) times daily.   [name] atorvastatin (LIPITOR) 20 MG tablet Take 20 mg by mouth at bedtime.   [name] diltiazem (CARDIZEM CD) 180 MG 24 hr capsule Take 180 mg by mouth daily.   [name] docusate sodium (COLACE) 100 MG capsule Take 1 capsule (100 mg total) by mouth 2 (two) times daily. 06/20/2015: Only as needed.   . dorzolamide-timolol (COSOPT) 22.3-6.8 MG/ML ophthalmic solution Place  1 drop into both eyes 2 (two) times daily.    . feeding supplement, ENSURE ENLIVE, (ENSURE ENLIVE) LIQD Take 237 mLs by mouth 2 (two) times daily with a meal. 08/07/2015: Three times a week   . furosemide (LASIX) 20 MG tablet Take 1 tablet (20 mg total) by mouth 2 (two) times daily. 08/07/2015: Last rx- pt to take  two tablets one day, one and one half tablet next day, alternate.   [name] HYDROcodone-acetaminophen (NORCO) 10-325 MG tablet Take 1 tablet by mouth 3 (three) times daily as needed for moderate pain or severe pain.   [name] ipratropium-albuterol (DUONEB) 0.5-2.5 (3) MG/3ML SOLN Take 3 mLs by nebulization every 6 (six) hours.   [name] levothyroxine (SYNTHROID, LEVOTHROID) 112 MCG tablet Take 1 tablet (112 mcg total) by mouth daily.   . polyethylene glycol (MIRALAX / GLYCOLAX) packet Take 17 g by mouth at bedtime.    . potassium chloride (K-DUR) 10 MEQ tablet TAKE ONE TABLET TWICE A DAY   . traMADol (ULTRAM) 50 MG tablet Take 50 mg by mouth 3 (three) times daily as needed for moderate pain. Reported on 06/20/2015   . Travoprost, BAK Free, (TRAVATAN) 0.004 % SOLN ophthalmic solution Place 1 drop into both eyes at bedtime.   . Vitamin D, Ergocalciferol, (DRISDOL) 50000 UNITS CAPS capsule Take 50,000 Units by mouth every 7 (seven) days. Pt takes on Saturday.   [name]  warfarin (COUMADIN) 5 MG tablet Take 5 mg by mouth daily at 6 PM. 06/20/2015: Pt reports was told to take two 5 mg tablets today, go back to one tomorrow    No facility-administered encounter medications on file as of 08/07/2015.    Functional Status:   In your present state of health, do you have any difficulty performing the following activities: 07/17/2015 06/13/2015  Hearing? Y -  Vision? N -  Difficulty concentrating or making decisions? N -  Walking or climbing stairs? Y -  Dressing or bathing? Y -  Doing errands, shopping? Y N  Preparing Food and eating ? N -  Using the Toilet? N -  In the past six months, have you accidently leaked  urine? N -  Do you have problems with loss of bowel control? N -  Managing your Medications? N -  Managing your Finances? Y -  Housekeeping or managing your Housekeeping? Y -    Fall/Depression Screening:    PHQ 2/9 Scores 07/17/2015 07/03/2015 05/01/2015  PHQ - 2 Score 0 0 4  PHQ- 9 Score - - 8    Assessment:  Pleasant, frail looking 82 year old woman, resides with spouse at [employer].                          Appetite- per pt, moderate loss of appetite, current weight 100 lbs.                          HF- lungs clear, O 2 saturation at rest with O 2 2 LNC- 96%.  Pt reported sob with O2 on.                                With decline in health, Hospice services discussed- benefits.                           Medication adherence-  Review of pt's medications pt to take Lasix 80 mg one day, alternate                                With 60 mg, also Potassium 10 meq bid.       Plan:  Pt to f/u with Dr. Dan Humphreys 7/21.             As discussed, pt to take Lasix and Potassium as ordered until f/u with Heart MD.             As discussed, pt to talk to son about Hospice services, call Dr. Nicholos Johns  if needed.             Plan to continue to provide pt with community nurse case management services, next h/v 8/3.                THN CM Care Plan Problem One        Most Recent Value   Care Plan Problem One  Knowledge deficit of Hospice services related to lack of familiarity as evidenced by statements of concern.    Role Documenting the Problem One  Care Management Coordinator   Care Plan for Problem One  Active   THN CM Short Term Goal #1 (0-30 days)  Pt's knowlegde of Hospice services would improve in the next 30 days    THN CM Short Term Goal #1 Start Date  08/07/15   Interventions for Short Term Goal #1  Discussed with pt what Hospice is, benefits of receiving services     [facility] CM Care Plan Problem Two        Most Recent Value   Care Plan  Problem Two  HF- self management    Role Documenting the Problem Two  Care Management Coordinator   Care Plan for Problem Two  Active   Interventions for Problem Two Long Term Goal   Discussed with pt ongoing compliance of wearing O2 (pt reported increased sob, only wearing 2/3 of time).    THN Long Term Goal (31-90) days  Pt would increase her use of O2 in the next 40 days    THN Long Term Goal Start Date  08/07/15   THN CM Short Term Goal #1 (0-30 days)  Pt would adhere to take Potassium and Lasix as ordered for the next 30 days unless changed by MD    [facility] CM Short Term Goal #1 Start Date  08/07/15   Interventions for Short Term Goal #2   Reviewed with pt importance of taking Potassium (recently refilled) and Lasix as ordered by MD.      Laurie Alkenose M.   Laurie Sprunger RN CCM [facility] Care Management  [phone]

## 2015-08-08 ENCOUNTER — Ambulatory Visit (INDEPENDENT_AMBULATORY_CARE_PROVIDER_SITE_OTHER): Payer: Medicare Other | Admitting: Cardiology

## 2015-08-08 DIAGNOSIS — I482 Chronic atrial fibrillation, unspecified: Secondary | ICD-10-CM

## 2015-08-08 LAB — PROTIME-INR: INR: 2.6 — AB (ref 0.9–1.1)

## 2015-08-17 ENCOUNTER — Encounter: Payer: Self-pay | Admitting: Internal Medicine

## 2015-08-17 ENCOUNTER — Telehealth: Payer: Self-pay | Admitting: *Deleted

## 2015-08-17 ENCOUNTER — Ambulatory Visit (INDEPENDENT_AMBULATORY_CARE_PROVIDER_SITE_OTHER): Payer: Medicare Other

## 2015-08-17 ENCOUNTER — Ambulatory Visit (INDEPENDENT_AMBULATORY_CARE_PROVIDER_SITE_OTHER): Payer: Medicare Other | Admitting: Internal Medicine

## 2015-08-17 VITALS — BP 96/64 | HR 76 | Ht 64.0 in | Wt 106.1 lb

## 2015-08-17 DIAGNOSIS — I5032 Chronic diastolic (congestive) heart failure: Secondary | ICD-10-CM

## 2015-08-17 DIAGNOSIS — J189 Pneumonia, unspecified organism: Secondary | ICD-10-CM

## 2015-08-17 DIAGNOSIS — I482 Chronic atrial fibrillation, unspecified: Secondary | ICD-10-CM

## 2015-08-17 DIAGNOSIS — F329 Major depressive disorder, single episode, unspecified: Secondary | ICD-10-CM

## 2015-08-17 DIAGNOSIS — F32A Depression, unspecified: Secondary | ICD-10-CM

## 2015-08-17 DIAGNOSIS — G894 Chronic pain syndrome: Secondary | ICD-10-CM

## 2015-08-17 LAB — CBC WITH DIFFERENTIAL/PLATELET
Basophils Absolute: 51 {cells}/uL (ref 0–200)
Basophils Relative: 1 %
Eosinophils Absolute: 102 {cells}/uL (ref 15–500)
Eosinophils Relative: 2 %
HCT: 37.2 % (ref 35.0–45.0)
Hemoglobin: 11.9 g/dL (ref 11.7–15.5)
Lymphocytes Relative: 11 %
Lymphs Abs: 561 {cells}/uL — ABNORMAL LOW (ref 850–3900)
MCH: 30.5 pg (ref 27.0–33.0)
MCHC: 32 g/dL (ref 32.0–36.0)
MCV: 95.4 fL (ref 80.0–100.0)
MPV: 9.3 fL (ref 7.5–12.5)
Monocytes Absolute: 612 {cells}/uL (ref 200–950)
Monocytes Relative: 12 %
Neutro Abs: 3774 {cells}/uL (ref 1500–7800)
Neutrophils Relative %: 74 %
Platelets: 178 10*3/uL (ref 140–400)
RBC: 3.9 MIL/uL (ref 3.80–5.10)
RDW: 14.2 % (ref 11.0–15.0)
WBC: 5.1 10*3/uL (ref 3.8–10.8)

## 2015-08-17 MED ORDER — TRAMADOL HCL 50 MG PO TABS: 50.0000 mg | ORAL_TABLET | Freq: Three times a day (TID) | ORAL | Status: AC | PRN

## 2015-08-17 MED ORDER — HYDROCODONE-ACETAMINOPHEN 10-325 MG PO TABS: 1.0000 | ORAL_TABLET | Freq: Three times a day (TID) | ORAL | Status: DC | PRN

## 2015-08-17 NOTE — Progress Notes (Signed)
Subjective:    Patient ID: Laurie Larsen, female    DOB: 12-Nov-1923, 82 y.o.   MRN: [id number]  HPI  82YO female presents for follow up.  Chronic back pain - Recent some increase in back pain. Taking Tramadol and prn Hydrocodone. Sometimes feels that she may need a higher dose of pain medication because pain is severe at times with activity.  Feeling more short of breath recently. Wearing 2L oxygen most of time. No cough. No fever or chills.  Some depressed mood at times with chronic health issues. However, she feels that this is transient.    Wt Readings from Last 3 Encounters:  08/17/15 106 lb 1.9 oz (48.136 kg)  08/07/15 100 lb (45.36 kg)  07/17/15 107 lb 12.8 oz (48.898 kg)   BP Readings from Last 3 Encounters:  08/17/15 96/64  08/07/15 122/80  07/17/15 130/70    Past Medical History  Diagnosis Date  . Permanent atrial fibrillation (HCC)     a. Chronic Coumadin.  [name] HTN (hypertension)   . Chronic diastolic CHF (congestive heart failure), NYHA class 1 (HCC)     a. 04/2013 Echo: EF 50-55%, triv MR, mildly dil LA, PASP , mild TR, mild-mod RA dil.  [name] CAD (coronary artery disease)     a. 2005 Cath: 100% dLCX-->Med managed.  . Sick sinus syndrome (HCC)     a. 05/2008 s/p MDT EnRhythm DC PPM ([facility]).  . Chronic pain     a. ? due to arthritis  . Glaucoma   . Hyperlipidemia   . Diverticulitis large intestine   . Allergy   . Phlebitis     a. after pacemeker placement in 2010.  [name] Colon polyps   . Hypothyroidism   . Transfusion history     a. After childbirth 60 years ago (1957)  . Rash/skin eruption     a. Multiple episodes - wide distribution-intermittent, possibly drug rash.  . Pulmonary HTN (HCC)     a. PASP on echo 04/2013.  [name] Chronic Right Pleural Effusion     a. 07/2012 s/p thoracentesis - Followed [facility] Dr. [facility]elford [facility].  . Arthritis   . Bronchitis   . Hay fever   . Colon polyps   . History of blood transfusion   . Urine  incontinence   . Back complaints   . Acalculous cholecystitis   . CHF (congestive heart failure) (HCC)    Family History  Problem Relation Age of Onset  . Cancer Mother     colon with mets  . Diabetes Mother   . Heart disease Father   . Hyperlipidemia Father   . Hypertension Father   . Heart attack Father   . Stroke Brother     brother who died 06-04-22  . Cancer Paternal Aunt     breast  . Cancer Cousin     uterine, lung cancer  . Heart attack Brother    Past Surgical History  Procedure Laterality Date  . Appendectomy  1939  . Abdominal hysterectomy  1977  . Tonsillectomy  82 yrs old  . Kidney surgery  1955    Right  . Ankle fracture surgery  1979    Left, pin  . Pacemaker insertion  06/20/08    MDT EnRhythm DR implanted by Dr Reyes Ivan  . Breast surgery      Needle guided excision, right breast calcification  . Breast fibroadenoma surgery  2010    Right  . Skin graft  left leg  . Breast biopsy  2011   Social History   Social History  . Marital Status: Married    Spouse Name: Lorella NimrodHarvey  . Number of Children: 3  . Years of Education: 13   Occupational History  . RETIRED    Social History Main Topics  . Smoking status: Never Smoker   . Smokeless tobacco: Never Used  . Alcohol Use: No  . Drug Use: No  . Sexual Activity: No   Other Topics Concern  . None   Social History Narrative   [place] Three sons.      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   Work: [employer] and then [employer] dept - retired.     Married 1948.    3 sons - '52, '57, '58; 5 grandchildren-one deceased -  OD @ 7121.        End of Life Care: no prolonged heroic measures, i.e. Artificial feeding or hydration; DNR; no prolonged intubation.    Review of Systems  Constitutional: Positive for fatigue. Negative for fever, chills, appetite change and unexpected weight change.  Eyes: Negative for visual disturbance.  Respiratory: Positive for  shortness of breath. Negative for cough, chest tightness and wheezing.   Cardiovascular: Negative for chest pain, palpitations and leg swelling.  Gastrointestinal: Negative for nausea, vomiting, abdominal pain, diarrhea and constipation.  Musculoskeletal: Positive for myalgias, back pain and arthralgias.  Skin: Negative for color change and rash.  Hematological: Negative for adenopathy. Does not bruise/bleed easily.  Psychiatric/Behavioral: Positive for dysphoric mood. Negative for suicidal ideas and sleep disturbance. The patient is not nervous/anxious.        Objective:    BP 96/64 mmHg  Pulse 76  Ht 5\' 4"  (1.626 m)  Wt 106 lb 1.9 oz (48.136 kg)  BMI 18.21 kg/m2  SpO2 97% Physical Exam  Constitutional: She is oriented to person, place, and time. She appears well-developed and well-nourished. No distress.  HENT:  Head: Normocephalic and atraumatic.  Right Ear: External ear normal.  Left Ear: External ear normal.  Nose: Nose normal.  Mouth/Throat: Oropharynx is clear and moist. No oropharyngeal exudate.  Eyes: Conjunctivae are normal. Pupils are equal, round, and reactive to light. Right eye exhibits no discharge. Left eye exhibits no discharge. No scleral icterus.  Neck: Normal range of motion. Neck supple. No tracheal deviation present. No thyromegaly present.  Cardiovascular: Normal rate, normal heart sounds and intact distal pulses.  An irregularly irregular rhythm present. Exam reveals no gallop and no friction rub.   No murmur heard. Pulmonary/Chest: Effort normal and breath sounds normal. No accessory muscle usage. No tachypnea. No respiratory distress. She has no decreased breath sounds. She has no wheezes. She has no rhonchi. She has no rales. She exhibits no tenderness.  Musculoskeletal: Normal range of motion. She exhibits no edema or tenderness.  Lymphadenopathy:    She has no cervical adenopathy.  Neurological: She is alert and oriented to person, place, and time. No  cranial nerve deficit. She exhibits normal muscle tone. Coordination normal.  Skin: Skin is warm and dry. No rash noted. She is not diaphoretic. No erythema. No pallor.  Psychiatric: She has a normal mood and affect. Her behavior is normal. Judgment and thought content normal.          Assessment & Plan:   Problem List Items Addressed This Visit      High   ATRIAL FIBRILLATION, CHRONIC - Primary (Chronic)  Rate well controlled. Anticoagulated with Warfarin. Reviewed cardiology notes. Recent INR 2.6      CHF (congestive heart failure), NYHA class I (HCC) (Chronic)    Symptomatically, doing well. Continue current medications.      Chronic pain syndrome (Chronic)    Chronic severe pain from scoliosis. Stable with use of Tramadol and Hydrocodone for several years (both her and with previous provider). She understands the risks of these medications and she limits use as much as possible. Refills given today.        Unprioritized   Depression    Recent mild depressed mood. Understandable, given current situation with her husband who has been ill and her ongoing medical issues. Offered support today. She prefers not to add medications.      Pneumonia    Recent pneumonia. Exam is normal today. Will get follow up CXR.      Relevant Orders   DG Chest 2 View   CBC with Differential/Platelet   Comprehensive metabolic panel       Return in about 4 weeks (around 09/14/2015) for New Patient.  Ronna Polio, MD Internal Medicine [facility]

## 2015-08-17 NOTE — Assessment & Plan Note (Signed)
Rate well controlled. Anticoagulated with Warfarin. Reviewed cardiology notes. Recent INR 2.6

## 2015-08-17 NOTE — Assessment & Plan Note (Signed)
Recent mild depressed mood. Understandable, given current situation with her husband who has been ill and her ongoing medical issues. Offered support today. She prefers not to add medications.

## 2015-08-17 NOTE — Assessment & Plan Note (Signed)
Symptomatically, doing well. Continue current medications. 

## 2015-08-17 NOTE — Assessment & Plan Note (Signed)
Recent pneumonia. Exam is normal today. Will get follow up CXR.

## 2015-08-17 NOTE — Assessment & Plan Note (Signed)
Chronic severe pain from scoliosis. Stable with use of Tramadol and Hydrocodone for several years (both her and with previous provider). She understands the risks of these medications and she limits use as much as possible. Refills given today.

## 2015-08-17 NOTE — Progress Notes (Signed)
Pre visit review using our clinic review tool, if applicable. No additional management support is needed unless otherwise documented below in the visit note. 

## 2015-08-17 NOTE — Telephone Encounter (Signed)
Please advise thanks.

## 2015-08-17 NOTE — Patient Instructions (Signed)
Labs and chest xray today.  Follow up to establish care with Dr. Lorin PicketScott.

## 2015-08-17 NOTE — Telephone Encounter (Signed)
Can Dr Lorin PicketScott accept patient to continue care

## 2015-08-18 LAB — COMPREHENSIVE METABOLIC PANEL
ALK PHOS: 84 U/L (ref 33–130)
ALT: 16 U/L (ref 6–29)
AST: 24 U/L (ref 10–35)
Albumin: 3.7 g/dL (ref 3.6–5.1)
BILIRUBIN TOTAL: 0.5 mg/dL (ref 0.2–1.2)
BUN: 26 mg/dL — AB (ref 7–25)
CO2: 25 mmol/L (ref 20–31)
CREATININE: 0.99 mg/dL — AB (ref 0.60–0.88)
Calcium: 8.5 mg/dL — ABNORMAL LOW (ref 8.6–10.4)
Chloride: 102 mmol/L (ref 98–110)
Glucose, Bld: 145 mg/dL — ABNORMAL HIGH (ref 65–99)
POTASSIUM: 4.4 mmol/L (ref 3.5–5.3)
Sodium: 139 mmol/L (ref 135–146)
TOTAL PROTEIN: 6.4 g/dL (ref 6.1–8.1)

## 2015-08-19 NOTE — Telephone Encounter (Signed)
See me about this pt.

## 2015-08-20 ENCOUNTER — Telehealth: Payer: Self-pay

## 2015-08-20 MED ORDER — DOXYCYCLINE HYCLATE 100 MG PO TABS: 100.0000 mg | ORAL_TABLET | Freq: Two times a day (BID) | ORAL | 0 refills | Status: DC

## 2015-08-20 NOTE — Telephone Encounter (Signed)
Medication has been ordered per Xray results.

## 2015-08-20 NOTE — Telephone Encounter (Signed)
-----   Message from Shelia Media, MD sent at 08/18/2015  2:26 PM EDT ----- CXR showed area of consolidation in the lung. This was an xray to follow up pneumonia, and she did not have symptoms of pneumonia, however she does have some chronic cough. I would recommend a trial of antibiotics. Please ask her if she has taken Doxycycline before. She is allergic to penicillin and levaquin by report. I would like to start Doxycycline 100mg  twice daily for 10 days, then repeat CXR in 2 weeks.  Charlene - She will be following up with you (assuming you take her as a patient). I wanted to be sure you are aware.

## 2015-08-28 ENCOUNTER — Encounter: Payer: Self-pay | Admitting: Student

## 2015-08-28 ENCOUNTER — Ambulatory Visit (INDEPENDENT_AMBULATORY_CARE_PROVIDER_SITE_OTHER): Payer: Medicare Other | Admitting: Student

## 2015-08-28 VITALS — BP 156/88 | HR 72 | Ht 63.0 in | Wt 104.0 lb

## 2015-08-28 DIAGNOSIS — I482 Chronic atrial fibrillation, unspecified: Secondary | ICD-10-CM

## 2015-08-28 DIAGNOSIS — I5032 Chronic diastolic (congestive) heart failure: Secondary | ICD-10-CM | POA: Diagnosis not present

## 2015-08-28 DIAGNOSIS — I272 Other secondary pulmonary hypertension: Secondary | ICD-10-CM | POA: Diagnosis not present

## 2015-08-28 DIAGNOSIS — Z950 Presence of cardiac pacemaker: Secondary | ICD-10-CM

## 2015-08-28 DIAGNOSIS — E785 Hyperlipidemia, unspecified: Secondary | ICD-10-CM

## 2015-08-28 DIAGNOSIS — I2510 Atherosclerotic heart disease of native coronary artery without angina pectoris: Secondary | ICD-10-CM | POA: Diagnosis not present

## 2015-08-28 DIAGNOSIS — I10 Essential (primary) hypertension: Secondary | ICD-10-CM

## 2015-08-28 MED ORDER — ATORVASTATIN CALCIUM 20 MG PO TABS: 20.0000 mg | ORAL_TABLET | Freq: Every day | ORAL | 6 refills | Status: AC

## 2015-08-28 MED ORDER — FUROSEMIDE 20 MG PO TABS: 20.0000 mg | ORAL_TABLET | Freq: Two times a day (BID) | ORAL | 6 refills | Status: AC

## 2015-08-28 MED ORDER — POTASSIUM CHLORIDE ER 10 MEQ PO TBCR: 10.0000 meq | EXTENDED_RELEASE_TABLET | Freq: Two times a day (BID) | ORAL | 6 refills | Status: AC

## 2015-08-28 NOTE — [place] Notes (Signed)
Cardiology Office Note    Date:  08/28/2015   ID:  Laurie Larsen, DOB 04-17-1923, MRN [id number]  PCP:  Wynona Dove, MD  Cardiologist:  Dr. Swaziland  Chief Complaint  Patient presents with  . Follow-up    had some chest pain last week, has shortness of breath, occassional edema, rarely has lightedheaded or dizziness has pain in legs    History of Present Illness:    Laurie Larsen is a 82 y.o. female with past medical history of CAD (known 100% dLCx occlusion by cath in 2005, medically managed), COPD (on 2L- 3L [place]) chronic diastolic CHF (EF 40-98% by echo in 02/2015), chronic R pleural effusion, chronic atrial fibrillation (on Coumadin), pulm HTN, SSS (s/p PPM placement in 2010), and chronic pain secondary to scoliosis who presents to the office today for follow-up of her CHF.  Last hospital admission was at [facility] in 02/2015 for acute hypoxic respiratory failure secondary to Influenza and Klebsiella PNA. She underwent thoracentesis for R pleural effusion at that time. Hospitalized again in 05/2015 for CAP and acute on chronic diastolic CHF. Seen by Dr. Swaziland in 06/2015 and her weight was 104 lbs at that time.   She reports doing well since seeing her recent office visit. Had a repeat CXR last week which showed an area of consolidation so she was treated again for PNA with Doxycycline (still taking at this time). Says her respiratory status is at baseline. Denies a productive cough. She has been taking PO Lasix 20mg  BID and is doing well with this dose, for it was changed to 40mg  in the AM earlier this year and she had very freqeuent urination with this. She weighs herself 2-3 times per week but not daily due to not being able to visually see the scales. When checked, her weight has been stable at 104 - 105 lbs, at 104 lbs today. She has baseline 2 pillow orthopnea and denies any acute changes in this. She uses 3L [place] at night and 2L intermittently throughout the day. Denies any  recent lower extremity edema or PND.  She reports having palpitations two weekends ago. She said her heart "felt slow" at the time though and nursing staff checked her HR and it was in the mid-50's. HR since has been in the 70's when checked. Denies any recent chest pain.   She still resides at [facility] with her husband. Nurse from [place] present at her visit today. Has DNR in place.   Past Medical History:  Diagnosis Date  . Acalculous cholecystitis   . Allergy   . Arthritis   . Back complaints   . Bronchitis   . CAD (coronary artery disease)    a. 2005 Cath: 100% dLCX-->Med managed.  . CHF (congestive heart failure) (HCC)   . Chronic diastolic CHF (congestive heart failure), NYHA class 1 (HCC)    a. 04/2013 Echo: EF 50-55%, triv MR, mildly dil LA, PASP , mild TR, mild-mod RA dil.  . Chronic pain    a. ? due to arthritis  . Chronic Right Pleural Effusion    a. 07/2012 s/p thoracentesis - Followed by Dr. Delford Field [facility].  . Colon polyps   . Colon polyps   . Diverticulitis large intestine   . Glaucoma   . Hay fever   . History of blood transfusion   . HTN (hypertension)   . Hyperlipidemia   . Hypothyroidism   . Permanent atrial fibrillation (HCC)    a.  Chronic Coumadin.  [name] Phlebitis    a. after pacemeker placement in 2010.  . Pulmonary HTN (HCC)    a. PASP on echo 04/2013.  . Rash/skin eruption    a. Multiple episodes - wide distribution-intermittent, possibly drug rash.  . Sick sinus syndrome (HCC)    a. 05/2008 s/p MDT EnRhythm DC PPM (Allred).  . Transfusion history    a. After childbirth 60 years ago (1957)  . Urine incontinence     Past Surgical History:  Procedure Laterality Date  . ABDOMINAL HYSTERECTOMY  1977  . ANKLE FRACTURE SURGERY  1979   Left, pin  . APPENDECTOMY  1939  . BREAST BIOPSY  2011  . BREAST FIBROADENOMA SURGERY  2010   Right  . BREAST SURGERY     Needle guided excision, right breast calcification  .  KIDNEY SURGERY  1955   Right  . PACEMAKER INSERTION  06/20/08   MDT EnRhythm DR implanted by Dr Reyes Ivan  . SKIN GRAFT     left leg  . TONSILLECTOMY  82 yrs old    Current Medications: Outpatient Medications Prior to Visit  Medication Sig Dispense Refill  . acetaminophen (TYLENOL) 650 MG CR tablet Take 1,300 mg by mouth at bedtime as needed for pain.    [name] atenolol (TENORMIN) 25 MG tablet Take 1 tablet (25 mg total) by mouth 2 (two) times daily. 180 tablet 3  . diltiazem (CARDIZEM CD) 180 MG 24 hr capsule Take 180 mg by mouth daily.    [name] docusate sodium (COLACE) 100 MG capsule Take 1 capsule (100 mg total) by mouth 2 (two) times daily. 10 capsule 0  . dorzolamide-timolol (COSOPT) 22.3-6.8 MG/ML ophthalmic solution Place 1 drop into both eyes 2 (two) times daily.     [name] doxycycline (VIBRA-TABS) 100 MG tablet Take 1 tablet (100 mg total) by mouth 2 (two) times daily. 20 tablet 0  . feeding supplement, ENSURE ENLIVE, (ENSURE ENLIVE) LIQD Take 237 mLs by mouth 2 (two) times daily with a meal. 237 mL 12  . HYDROcodone-acetaminophen (NORCO) 10-325 MG tablet Take 1 tablet by mouth 3 (three) times daily as needed for moderate pain or severe pain. 90 tablet 0  . ipratropium-albuterol (DUONEB) 0.5-2.5 (3) MG/3ML SOLN Take 3 mLs by nebulization every 6 (six) hours. 360 mL 0  . levothyroxine (SYNTHROID, LEVOTHROID) 112 MCG tablet Take 1 tablet (112 mcg total) by mouth daily. 90 tablet 0  . polyethylene glycol (MIRALAX / GLYCOLAX) packet Take 17 g by mouth at bedtime.     . traMADol (ULTRAM) 50 MG tablet Take 1 tablet (50 mg total) by mouth 3 (three) times daily as needed for moderate pain. Reported on 06/20/2015 90 tablet 0  . Travoprost, BAK Free, (TRAVATAN) 0.004 % SOLN ophthalmic solution Place 1 drop into both eyes at bedtime.    . Vitamin D, Ergocalciferol, (DRISDOL) 50000 UNITS CAPS capsule Take 50,000 Units by mouth every 7 (seven) days. Pt takes on Saturday.    . warfarin (COUMADIN) 5 MG tablet Take  5 mg by mouth daily at 6 PM.    . atorvastatin (LIPITOR) 20 MG tablet Take 20 mg by mouth at bedtime.    . furosemide (LASIX) 20 MG tablet Take 1 tablet (20 mg total) by mouth 2 (two) times daily. 60 tablet 2  . potassium chloride (K-DUR) 10 MEQ tablet TAKE ONE TABLET TWICE A DAY 180 tablet 3   No facility-administered medications prior to visit.      Allergies:  Amoxicillin; Cetylpyridinium; Digoxin; Levaquin [levofloxacin]; and Risedronate sodium   Social History   Social History  . Marital status: Married    Spouse name: Laurie Larsen  . Number of children: 3  . Years of education: 44   Occupational History  . RETIRED Retired   Social History Main Topics  . Smoking status: Never Smoker  . Smokeless tobacco: Never Used  . Alcohol use No  . Drug use: No  . Sexual activity: No   Other Topics Concern  . None   Social History Narrative   Lives with husband in [place]. Three sons.      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     Married 1948.    3 sons - 06/27/50, 27-Jun-2055, 06/26/56; 5 grandchildren-one deceased -  OD @ 77.        End of Life Care: no prolonged heroic measures, i.e. Artificial feeding or hydration; DNR; no prolonged intubation.     Family History:  The patient's family history includes Cancer in her cousin, mother, and paternal aunt; Diabetes in her mother; Heart attack in her brother and father; Heart disease in her father; Hyperlipidemia in her father; Hypertension in her father; Stroke in her brother.   Review of Systems:   Please see the history of present illness.    Review of Systems  Constitution: Positive for weakness. Negative for chills, decreased appetite and fever.  Eyes: Positive for blurred vision.  Cardiovascular: Positive for irregular heartbeat, orthopnea and palpitations. Negative for chest pain, claudication, dyspnea on exertion, paroxysmal nocturnal dyspnea and syncope.    Respiratory: Positive for shortness of breath. Negative for cough, hemoptysis, sputum production and wheezing.   Gastrointestinal: Negative for abdominal pain, hematemesis, hematochezia and melena.  Genitourinary: Negative for hematuria.  Neurological: Negative for dizziness and light-headedness.   All other systems reviewed and are negative.   Physical Exam:    VS:  BP (!) 156/88 (BP Location: Right Arm, Patient Position: Sitting, Cuff Size: Small)   Pulse 72   Ht 5\' 3"  (1.6 m)   Wt 104 lb (47.2 kg)   BMI 18.42 kg/m    GEN: Elderly Caucasian female appearing in no acute distress  HEENT: normal  Neck: JVD 8 cm, no carotid bruits or masses Cardiac: Irregularly irregular; no murmurs, rubs, or gallops,no edema  Respiratory:  clear to auscultation bilaterally, normal work of breathing GI: soft, nontender, nondistended, + BS MS: no deformity or atrophy  Skin: warm and dry, no rash Neuro:  Alert and Oriented x 3, Strength and sensation are intact Psych: euthymic mood, full affect  Wt Readings from Last 3 Encounters:  08/28/15 104 lb (47.2 kg)  08/17/15 106 lb 1.9 oz (48.1 kg)  08/07/15 100 lb (45.4 kg)      Studies/Labs Reviewed:   EKG:  EKG is ordered today.  The ekg ordered today demonstrates atrial fibrillation, HR 72, RAD, no acute ST or T-wave changes.  Recent Labs: 10/23/2014: Magnesium 1.9 03/26/2015: TSH 3.016 06/13/2015: B Natriuretic Peptide 976.0 08/17/2015: ALT 16; BUN 26; Creat 0.99; Hemoglobin 11.9; Platelets 178; Potassium 4.4; Sodium 139   Lipid Panel    Component Value Date/Time   CHOL 117 06/01/2014 0438   CHOL 91 09/13/2013 0451   TRIG 68 06/01/2014 0438   TRIG 79 09/13/2013 0451   HDL 43 06/01/2014 0438   HDL 42 09/13/2013 0451   CHOLHDL 2.7 06/01/2014 0438   VLDL  14 06/01/2014 0438   VLDL 16 09/13/2013 0451   LDLCALC 60 06/01/2014 0438   LDLCALC 33 09/13/2013 0451    Additional studies/ records that were reviewed today include:  Echo:  02/2015 Study Conclusions  - Left ventricle: The cavity size was normal. Systolic function was   normal. The estimated ejection fraction was in the range of 55%   to 60%. Wall motion was normal; there were no regional wall   motion abnormalities. The study is not technically sufficient to   allow evaluation of LV diastolic function. - Mitral valve: There was mild regurgitation. - Left atrium: The atrium was mildly dilated. - Right ventricle: Pacer wire or catheter noted in right ventricle.   Systolic function was mildly reduced. - Right atrium: The atrium was mildly dilated. - Tricuspid valve: There was mild-moderate regurgitation. - Pulmonary arteries: Systolic pressure was severely elevated. PA   peak pressure: 72 mm Hg (S).  Impressions:  - Rhythm is atrial fibrillation.  Assessment:    1. Diastolic CHF, chronic (HCC)   2. Chronic atrial fibrillation (HCC)   3. Essential hypertension, benign   4. Coronary artery disease involving native coronary artery of native heart without angina pectoris   5. Pulmonary HTN (HCC)   6. HLD (hyperlipidemia)   7. Cardiac pacemaker in situ      Plan:   In order of problems listed above:  1. Chronic Diastolic CHF - echo in 02/2015 showing preserved EF of 55-60%. Denies any acute changes in her respiratory status. On 2L- 3L [place] at baseline with chronic orthopnea. Does not appear volume overloaded on examination. Weight stable at 104 lbs.   - continue PO Lasix at current dosing of 20mg  BID. She has been taking an extra 20mg  at lunch if needed for lower extremity edema or orthopnea. Instructed to continue with this. Reviewed the importance of daily weights and consuming a low-sodium diet.   2. Chronic Atrial Fibrillation - This patients CHA2DS2-VASc Score and unadjusted Ischemic Stroke Rate (% per year) is equal to 9.7 % stroke rate/year from a score of 6 (CHF, HTN, Vascular, Female, Age (2)). Continue Coumadin.  - reported having  palpitations a few weeks ago, yet HR was in the 50's when checked. HR has been in the 60's - 80's since. Continue Cardizem and Atenolol at current dosing.   3. Essential HTN - BP slightly elevated at 156/89 at office visit, yet she did not take any of her morning medications. - continue current medication regimen.  4. CAD of native coronary artery without angina pectoris - has known 100% dLCx occlusion by cath in 2005, medically managed. - denies any recent episodes of chest discomfort or dyspnea with exertion. EKG without acute ischemic changes. - continue BB and statin. No ASA due to concurrent Coumadin.   5. Pulmonary HTN - PA peak pressure 72 mm Hg in 02/2015 - diuretic therapy as above.  6. HLD - Lipid Panel checked in 2016 with LDL of 60. LFT's checked by PCP on 7/21 and within normal limits. - not fasting this AM. Continue with Atorvastatin 20mg  daily as she is tolerating this well and LDL has been at goal with this medication.  7. Cardiac Pacemaker in situ - PPM placed in 2010 secondary to tachy-brady syndrome. - device at [facility]. Per Dr. Graciela Husbands, pacing mostly nocturnal and not planning for PPM replacement at this time.   Medication Adjustments/Labs and Tests Ordered: Current medicines are reviewed at length with the patient today.  Concerns regarding  medicines are outlined above.  Medication changes, Labs and Tests ordered today are listed in the Patient Instructions below. Patient Instructions  Medication Instructions:  Your physician recommends that you continue on your current medications as directed. Please refer to the Current Medication list given to you today.  Labwork: None ordererd  Testing/Procedures: None ordered  Follow-Up: Your physician recommends that you schedule a follow-up appointment in: 3-4 Months with DR Swaziland.   Any Other Special Instructions Will Be Listed Below (If Applicable).     If you need a refill on your cardiac medications before your  next appointment, please call your pharmacy.      Signed, Ellsworth Lennox, PA  08/28/2015 12:59 PM    [facility] [address] [place], [place], [place]  [address] Phone: [phone]; Fax: [phone]

## 2015-08-28 NOTE — Patient Instructions (Signed)
Medication Instructions:  Your physician recommends that you continue on your current medications as directed. Please refer to the Current Medication list given to you today.  Labwork: None ordererd  Testing/Procedures: None ordered  Follow-Up: Your physician recommends that you schedule a follow-up appointment in: 3-4 Months with DR Swaziland.   Any Other Special Instructions Will Be Listed Below (If Applicable).     If you need a refill on your cardiac medications before your next appointment, please call your pharmacy.

## 2015-08-29 LAB — PROTIME-INR: INR: 2.7 — AB (ref 0.9–1.1)

## 2015-08-30 ENCOUNTER — Ambulatory Visit (INDEPENDENT_AMBULATORY_CARE_PROVIDER_SITE_OTHER): Payer: Medicare Other | Admitting: Cardiovascular Disease

## 2015-08-30 ENCOUNTER — Other Ambulatory Visit: Payer: Self-pay | Admitting: *Deleted

## 2015-08-30 ENCOUNTER — Ambulatory Visit: Payer: Self-pay | Admitting: *Deleted

## 2015-08-30 DIAGNOSIS — I482 Chronic atrial fibrillation, unspecified: Secondary | ICD-10-CM

## 2015-08-30 NOTE — Patient Outreach (Signed)
Arrived a pt's apartment for scheduled home visit, no answer after several knocks.    Called pt on her cell phone and home phone, HIPAA compliant voice messages  left with RN CM's  contact name and number.     Plan: If no response to voice messages left today with pt, plan to f/u with pt telephonically within next 5 days.     Shayne Alken.   Summerlynn Glauser RN CCM [facility] Care Management  [phone]

## 2015-09-03 ENCOUNTER — Ambulatory Visit: Payer: Self-pay | Admitting: *Deleted

## 2015-09-03 ENCOUNTER — Other Ambulatory Visit: Payer: Self-pay | Admitting: *Deleted

## 2015-09-03 NOTE — Patient Outreach (Addendum)
Second attempt made to contact pt on both her home phone and cell phone, follow up on no show for scheduled home visit 8/3. HIPAA compliant voice message left on both numbers (home, cell).   Plan: If no response to voice messages left today, plan to follow up again this week telephonically.   Shayne Alkenose M.   Pierzchala RN CCM [facility] Care Management  [phone]

## 2015-09-04 ENCOUNTER — Ambulatory Visit (INDEPENDENT_AMBULATORY_CARE_PROVIDER_SITE_OTHER): Payer: Medicare Other

## 2015-09-04 DIAGNOSIS — I482 Chronic atrial fibrillation, unspecified: Secondary | ICD-10-CM

## 2015-09-04 LAB — PROTIME-INR: INR: 3.6 — AB (ref 0.9–1.1)

## 2015-09-05 ENCOUNTER — Encounter: Payer: Self-pay | Admitting: General Surgery

## 2015-09-05 ENCOUNTER — Other Ambulatory Visit: Payer: Self-pay

## 2015-09-05 ENCOUNTER — Ambulatory Visit (INDEPENDENT_AMBULATORY_CARE_PROVIDER_SITE_OTHER): Payer: Medicare Other | Admitting: General Surgery

## 2015-09-05 ENCOUNTER — Ambulatory Visit: Payer: Self-pay | Admitting: *Deleted

## 2015-09-05 VITALS — BP 111/58 | HR 76 | Temp 97.6°F | Ht 63.0 in | Wt 104.6 lb

## 2015-09-05 DIAGNOSIS — Z01812 Encounter for preprocedural laboratory examination: Secondary | ICD-10-CM | POA: Diagnosis not present

## 2015-09-05 DIAGNOSIS — K810 Acute cholecystitis: Secondary | ICD-10-CM | POA: Diagnosis not present

## 2015-09-05 DIAGNOSIS — R1013 Epigastric pain: Secondary | ICD-10-CM

## 2015-09-05 DIAGNOSIS — G8929 Other chronic pain: Secondary | ICD-10-CM | POA: Diagnosis not present

## 2015-09-05 DIAGNOSIS — Z434 Encounter for attention to other artificial openings of digestive tract: Secondary | ICD-10-CM

## 2015-09-05 DIAGNOSIS — I2510 Atherosclerotic heart disease of native coronary artery without angina pectoris: Secondary | ICD-10-CM | POA: Diagnosis not present

## 2015-09-05 NOTE — Patient Instructions (Addendum)
You will need to call to make your appointment with [facility]. Their is number is [phone].  I have submitted the paperwork for your Cholangiogram, this will need to be approved by the radiologist, and I will let you know as soon as this is scheduled when this will be. You will need to have lab work completed within 1 week of your testing. I have placed orders for this testing to be done, just make sure that you have this complete within 7 days of the scheduled procedure.

## 2015-09-05 NOTE — Patient Outreach (Addendum)
Received a return phone call from pt's daughter in law assisting pt with her voice message from RN CM.    As requested daughter in law handed pt the phone, spoke with pt, HIPAA verified.  RN CM discussed with pt rescheduling home visit - pt was a  no show last week.  As agreed, plan to do a home visit this week.      Shayne Alkenose M.   Lyndle Pang RN CCM [facility] Care Management  [phone]

## 2015-09-05 NOTE — [place] Notes (Signed)
Outpatient Surgical Follow Up  09/05/2015  Laurie Larsen is an 82 y.o. female.   Chief Complaint  Patient presents with  . Follow-up    Cholecystostomy Drain Placement (10/17/2014)- Dr. Tonita CongWoodham    HPI: 82 year old female returns to clinic today to discuss cholecystostomy care. She was lost to follow-up earlier this year and has not been seen since the first of the year. She continues to have numerous chronic complaints. She again voiced today that she is ready to go to have been and stop oming to medical appointments. She does have occasional pains associated with the tube. She also has chronic constipation. She denies any current fevers, chills, nausea, vomiting. She does have a recent pneumonia for which she is being followed up for by her primary care provider.  Past Medical History:  Diagnosis Date  . Acalculous cholecystitis   . Allergy   . Arthritis   . Back complaints   . Bronchitis   . CAD (coronary artery disease)    a. 2005 Cath: 100% dLCX-->Med managed.  . CHF (congestive heart failure) (HCC)   . Chronic diastolic CHF (congestive heart failure), NYHA class 1 (HCC)    a. 04/2013 Echo: EF 50-55%, triv MR, mildly dil LA, PASP 48mmHg, mild TR, mild-mod RA dil. b. echo 02/2015: EF 55-60%, mild MR, mild-mod TR, PA peak pressure 72 mm Hg  . Chronic pain    a. ? due to arthritis  . Chronic Right Pleural Effusion    a. 07/2012 s/p thoracentesis - [facility]. b. 02/2015: s/p repeat R thoracentesis  . Colon polyps   . Colon polyps   . Diverticulitis large intestine   . Glaucoma   . Hay fever   . History of blood transfusion   . HTN (hypertension)   . Hyperlipidemia   . Hypothyroidism   . Permanent atrial fibrillation (HCC)    a. Chronic Coumadin.  [name]. Phlebitis    a. after pacemeker placement in 2010.  . Pulmonary HTN (HCC)    a. PASP 48mmHg on echo 04/2013. b. PASP 72 mmHg on echo in 02/2015  . Rash/skin eruption    a. Multiple episodes -  wide distribution-intermittent, possibly drug rash.  . Sick sinus syndrome (HCC)    a. 05/2008 s/p MDT EnRhythm DC PPM ([facility]red).  . Transfusion history    a. After childbirth 60 years ago (1957)  . Urine incontinence     Past Surgical History:  Procedure Laterality Date  . ABDOMINAL HYSTERECTOMY  1977  . ANKLE FRACTURE SURGERY  1979   Left, pin  . APPENDECTOMY  1939  . BREAST BIOPSY  2011  . BREAST FIBROADENOMA SURGERY  2010   Right  . BREAST SURGERY     Needle guided excision, right breast calcification  . KIDNEY SURGERY  1955   Right  . PACEMAKER INSERTION  06/20/08   MDT EnRhythm DR implanted by Dr Reyes IvanKersey  . SKIN GRAFT     left leg  . TONSILLECTOMY  82 yrs old    Family History  Problem Relation Age of Onset  . Cancer Mother     colon with mets  . Diabetes Mother   . Heart disease Father   . Hyperlipidemia Father   . Hypertension Father   . Heart attack Father   . Stroke Brother     brother who died '11  . Heart attack Brother   . Cancer Paternal Aunt     breast  . Cancer  Cousin     uterine, lung cancer    Social History:  reports that she has never smoked. She has never used smokeless tobacco. She reports that she does not drink alcohol or use drugs.  Allergies:  Allergies  Allergen Reactions  . Amoxicillin Other (See Comments)    Reaction:  Weakness Has patient had a PCN reaction causing immediate rash, facial/tongue/throat swelling, SOB or lightheadedness with hypotension: No Has patient had a PCN reaction causing severe rash involving mucus membranes or skin necrosis: No Has patient had a PCN reaction that required hospitalization No Has patient had a PCN reaction occurring within the last 10 years: No If all of the above answers are "NO", then may proceed with Cephalosporin use.  . Cetylpyridinium Other (See Comments)    Reaction:  Weakness   . Digoxin Other (See Comments)    Reaction:  Weakness and dehydration  . Levaquin [Levofloxacin] Itching  .  Risedronate Sodium Rash    Medications reviewed.    ROS A multipoint review of systems was completed. All pertinent positives and negatives are documented within the history of present illness and remainder are negative.   BP (!) 111/58   Pulse 76   Temp 97.6 F (36.4 C) (Oral)   Ht  (1.6 m)   Wt 47.4 kg (104 lb 9.6 oz)   BMI 18.53 kg/m   Physical Exam Gen.: No acute distress Chest: Clear to auscultation Heart: Regular rhythm Abdomen: Soft, nontender, nondistended. Cholecystostomy tube in place to the right upper quadrant without evidence of spreading erythema or purulence.    No results found for this or any previous visit (from the past 48 hour(s)). No results found.  Assessment/Plan:  1. Cholecystostomy care [facility]) 82 year old female with cholecystostomy tube that is now been in place for approximately 1 year. She very much so desires to have it removed. The tube is been being drained with regularity over that time by her home care provider. Discussed with her that we would repeat the cholangiogram that was performed in January to ascertain whether or not it is safe to be removed. If it is not safely removed it may need to be replaced given how long this one has been there. All questions answered to her satisfaction. She'll follow-up in clinic on an as-needed basis.     Ricarda Frame, MD FACS General Surgeon  09/05/2015,3:01 PM

## 2015-09-06 ENCOUNTER — Telehealth: Payer: Self-pay | Admitting: Internal Medicine

## 2015-09-06 ENCOUNTER — Telehealth: Payer: Self-pay

## 2015-09-06 ENCOUNTER — Telehealth: Payer: Self-pay | Admitting: *Deleted

## 2015-09-06 ENCOUNTER — Ambulatory Visit: Payer: Medicare Other | Admitting: *Deleted

## 2015-09-06 ENCOUNTER — Other Ambulatory Visit: Payer: Self-pay | Admitting: General Surgery

## 2015-09-06 ENCOUNTER — Ambulatory Visit: Payer: Self-pay | Admitting: *Deleted

## 2015-09-06 ENCOUNTER — Other Ambulatory Visit: Payer: Medicare Other

## 2015-09-06 ENCOUNTER — Other Ambulatory Visit: Payer: Self-pay | Admitting: *Deleted

## 2015-09-06 DIAGNOSIS — Z434 Encounter for attention to other artificial openings of digestive tract: Secondary | ICD-10-CM

## 2015-09-06 NOTE — Patient Outreach (Signed)
Arrived at pt's apartment [facility] [place] for scheduled home visit- knocked on door several times, no responses.  Attempt made to contact pt by phone, HIPAA compliant voice message left with contact name and number.    Plan: With this being pt's second no show, if no response to voice message left today, plan to call again.     Shayne Alkenose M.   Pierzchala RN CCM [facility] Care Management  [phone]

## 2015-09-06 NOTE — Telephone Encounter (Signed)
Spoke with Laurie HongJudy in scheduling. She has rescheduled patient for Friday, 09/28/15 at 0800. Patient is to arrive at 0700 in the medical mall for this procedure and may not have anything to eat or drink after midnight, the night prior.  Call made to patient at this time.   Patient was given all above information and has no further questions at this time.

## 2015-09-06 NOTE — Telephone Encounter (Signed)
Called Laurie Larsen at request of Charline Billsdrienne Sauls to get her scheduled at [facility].  Laurie Larsen was not expecting my call, states that [facility] charges her $25 to take her [place], but [place] [facility] and she does not want to come [place].  Spoke with N. Falish, she will check with Dr. Darrick Huntsmanullo who is seeing her other family members.  Explained to Laurie Larsen that someone from [facility] would get back with her tomorrow 09/07/15.

## 2015-09-06 NOTE — Telephone Encounter (Signed)
Patient has requested to have Dr. Lorin PicketScott as her new provider, she's aware that Dr. Lorin PicketScott is not accepting new pt, however she wanted to ask. -Thanks  Pt contact [phone]

## 2015-09-06 NOTE — Telephone Encounter (Signed)
[facility] in scheduling to give appointment time for Cholangiogram. I did make Darel HongJudy aware that patient is currently taking Coumadin 5mg  daily and has not had labwork recently. She did notify [place] who is currently speaking with Dr. Darvin NeighboursYomogata. She will return phone call with scheduling details as soon as this is panned out.

## 2015-09-06 NOTE — Telephone Encounter (Signed)
Patient seen in office yesterday. In need of T-tube cholangiogram to evaluate Gallbladder and whether Cholecystostomy drain may be discontinued. Orders placed.  Call made to specialty scheduling at this time. No answer. Left message for return phone call.

## 2015-09-06 NOTE — Telephone Encounter (Signed)
Discussed at 5:20 with Dr. Darrick Huntsmanullo.  She will accept patient.

## 2015-09-06 NOTE — Telephone Encounter (Signed)
Juanita spoke with Dr. Darvin NeighboursYomogata and patient does not need to be taken off of her Coumadin. She has been scheduled to have her Cholangiogram on 09/07/15 at 0830am. She will need to arrive at 0800am for this testing. They will draw her labs prior to her testing, she does not need to come in before to have this completed.  No prep per Darel HongJudy in scheduling.  Call made to patient at this time. She states that she is unable to make it tomorrow because she wants her daughter in law to come to cholangiogram. Patient will be able to do this any day but Thursday.  [facility]. No answer. Left voicemail for return phone call.

## 2015-09-07 ENCOUNTER — Telehealth: Payer: Self-pay | Admitting: Internal Medicine

## 2015-09-07 ENCOUNTER — Other Ambulatory Visit: Payer: Self-pay | Admitting: *Deleted

## 2015-09-07 ENCOUNTER — Other Ambulatory Visit: Payer: Medicare Other

## 2015-09-07 NOTE — Telephone Encounter (Signed)
This is the patient that [place] spoke with you about yesterday, needs a follow up appt, please advise as she will be a new patient to you on a date and time that I can use. thanks

## 2015-09-07 NOTE — Telephone Encounter (Signed)
Pt is scheduled for 08/18 @ 4:30pm.

## 2015-09-07 NOTE — Telephone Encounter (Signed)
Left her a VM to return my call.

## 2015-09-07 NOTE — Telephone Encounter (Signed)
Scheduled for 8/18 at 430 thanks

## 2015-09-07 NOTE — Telephone Encounter (Signed)
You can use an 11:30 or a 4:30  Next week

## 2015-09-07 NOTE — Telephone Encounter (Signed)
Pt called today. Pt needs a appt for a follow up appt. No appt avail to sch. Dr Darrick Huntsmanullo gave the ok to take pt on as a pt. Let me know where I can sch pt please and thank you!

## 2015-09-10 NOTE — Telephone Encounter (Signed)
Pt confirmed

## 2015-09-11 ENCOUNTER — Other Ambulatory Visit: Payer: Self-pay | Admitting: *Deleted

## 2015-09-11 NOTE — Patient Outreach (Signed)
Follow up phone call successful, two no show home visits.   Spoke with pt, HIPAA verified. Pt reports got days mixed up on when RN CM was to come for home visits.  As far as Hospice, pt  reports MD talked to son about it, waiting for son who has been busy to pursue and have Hospice come out and talk to me.  Pt reports she is ready for someone to come out, was informed of the benefits.   RN CM discussed with pt she can call MD herself and request Hospice services which she was interested in doing.  RN CM  provided pt with Dr. Mathis Fare number as she did not have it.   RN CM also inquired of pt if taking Lasix and Potassium as ordered by MD- discussed at last home visit.  Pt reports saw Heart MD's PA about 2 weeks ago, been taking Lasix 20 mg bid + extra 20 mg for swelling.  Pt reports needs to take an extra one today.   Review with pt Bernerd Pho PA 8/1 office visit- pt taking both Lasix and Potassium as ordered.   RN CM discussed with pt plan to close her case, goals met.  Pt reports home health ended, continues with VIP program at [facility]- receiving  personal care services.  Pt reports she can also go  to [facility] if needed.     Plan to inform Dr. Gilford Rile of case closure- send by in basket case closure letter.   Plan to inform [facility] care management assistant to close case.     Zara Chess.   [facility]  [phone]

## 2015-09-11 NOTE — Patient Outreach (Addendum)
Follow up phone call:  Called pt's home phone number, spouse Lorella NimrodHarvey answered- reports pt is not home right now, to call later.    Plan: plan to call pt later today, check on status,discussed home visit.   Shayne Alkenose M.   Firmin Belisle RN CCM [facility]  [phone]

## 2015-09-12 ENCOUNTER — Telehealth: Payer: Self-pay | Admitting: General Surgery

## 2015-09-12 ENCOUNTER — Encounter: Payer: Self-pay | Admitting: *Deleted

## 2015-09-12 LAB — PROTIME-INR: INR: 1.8 — AB (ref 0.9–1.1)

## 2015-09-12 NOTE — Telephone Encounter (Signed)
Patient is scheduled to have her gallbladder tube removed on September 1st in radiology. Please call patients daughter n law, Laurie Larsen. The family would like to know if this can be changed to another day, because on September 1st none of the family will be in town and they would like to be with her for the procedure

## 2015-09-13 ENCOUNTER — Ambulatory Visit (INDEPENDENT_AMBULATORY_CARE_PROVIDER_SITE_OTHER): Payer: Medicare Other | Admitting: Cardiology

## 2015-09-13 NOTE — Telephone Encounter (Signed)
Call made to speciality scheduling at this time to find out when this can be rescheduled. No answer. Left voicemail for return phone call.

## 2015-09-14 ENCOUNTER — Other Ambulatory Visit: Payer: Self-pay | Admitting: Ophthalmology

## 2015-09-14 ENCOUNTER — Ambulatory Visit (INDEPENDENT_AMBULATORY_CARE_PROVIDER_SITE_OTHER): Payer: Medicare Other | Admitting: Internal Medicine

## 2015-09-14 ENCOUNTER — Encounter: Payer: Self-pay | Admitting: Internal Medicine

## 2015-09-14 DIAGNOSIS — G453 Amaurosis fugax: Secondary | ICD-10-CM

## 2015-09-14 DIAGNOSIS — I2510 Atherosclerotic heart disease of native coronary artery without angina pectoris: Secondary | ICD-10-CM

## 2015-09-14 DIAGNOSIS — Z7901 Long term (current) use of anticoagulants: Secondary | ICD-10-CM | POA: Diagnosis not present

## 2015-09-14 DIAGNOSIS — I10 Essential (primary) hypertension: Secondary | ICD-10-CM | POA: Diagnosis not present

## 2015-09-14 DIAGNOSIS — G894 Chronic pain syndrome: Secondary | ICD-10-CM

## 2015-09-14 MED ORDER — HYDROCODONE-ACETAMINOPHEN 10-325 MG PO TABS: 1.0000 | ORAL_TABLET | Freq: Three times a day (TID) | ORAL | 0 refills | Status: DC | PRN

## 2015-09-14 NOTE — Progress Notes (Signed)
Subjective:  Patient ID: Laurie Larsen, female    DOB: Oct 04, 1923  Age: 82 y.o. MRN: [id number]  CC: Diagnoses of Essential hypertension, benign, Chronic pain syndrome, and Chronic anticoagulation were pertinent to this visit.  HPI Laurie Larsen presents for transfer of care from Dr Dan Humphreys to me.  Chronic atrial fib Chronic severe back pain for the past 4 years secondary to scoliosis managed with tramadol and hydrocodone . Uses a walker for the past 2 years. Cannot walk or participate in activities without medication .  Not a surgical candidate due to heart condition. Pain radiates from back to ankle on the right,  Uses  3 ES Vcodin daily and tramadol 100 mg twice daily .  Prior trial of a transdermal    Macular degeneration ,  Vision worsening,  Right worse than left . Has carotid artery ultrasound ordered by eye doctor bc of change in vision   Hospice coming out on Monday to help with pain management.  [facility]ives at [facility]   At the [facility] plan, provides assistance in the home  Transportation, meal preparation. Son is nearby  Has a cholecystostomy tube for over 6 months that is drained  By the [facility] nurse twice weekly   Wears supplemental oxygen usually 24/7, not wearing currently and sats 93%      Lab Results  Component Value Date   INR 1.8 (A) 09/12/2015   INR 3.6 (A) 09/03/2015   INR 2.7 (A) 08/29/2015     Outpatient Medications Prior to Visit  Medication Sig Dispense Refill  . acetaminophen (TYLENOL) 650 MG CR tablet Take 1,300 mg by mouth at bedtime as needed for pain.    [name] atenolol (TENORMIN) 25 MG tablet Take 1 tablet (25 mg total) by mouth 2 (two) times daily. 180 tablet 3  . atorvastatin (LIPITOR) 20 MG tablet Take 1 tablet (20 mg total) by mouth at bedtime. 30 tablet 6  . diltiazem (CARDIZEM CD) 180 MG 24 hr capsule Take 180 mg by mouth daily.    [name] docusate sodium (COLACE) 100 MG capsule Take 1 capsule (100 mg total) by mouth 2 (two) times daily. 10 capsule 0  .  dorzolamide-timolol (COSOPT) 22.3-6.8 MG/ML ophthalmic solution Place 1 drop into both eyes 2 (two) times daily.     . feeding supplement, ENSURE ENLIVE, (ENSURE ENLIVE) LIQD Take 237 mLs by mouth 2 (two) times daily with a meal. 237 mL 12  . furosemide (LASIX) 20 MG tablet Take 1 tablet (20 mg total) by mouth 2 (two) times daily. 60 tablet 6  . ipratropium-albuterol (DUONEB) 0.5-2.5 (3) MG/3ML SOLN Take 3 mLs by nebulization every 6 (six) hours. 360 mL 0  . levothyroxine (SYNTHROID, LEVOTHROID) 112 MCG tablet Take 1 tablet (112 mcg total) by mouth daily. 90 tablet 0  . polyethylene glycol (MIRALAX / GLYCOLAX) packet Take 17 g by mouth at bedtime.     . potassium chloride (K-DUR) 10 MEQ tablet Take 1 tablet (10 mEq total) by mouth 2 (two) times daily. 60 tablet 6  . traMADol (ULTRAM) 50 MG tablet Take 1 tablet (50 mg total) by mouth 3 (three) times daily as needed for moderate pain. Reported on 06/20/2015 90 tablet 0  . Travoprost, BAK Free, (TRAVATAN) 0.004 % SOLN ophthalmic solution Place 1 drop into both eyes at bedtime.    . Vitamin D, Ergocalciferol, (DRISDOL) 50000 UNITS CAPS capsule Take 50,000 Units by mouth every 7 (seven) days. Pt takes on Saturday.    . warfarin (COUMADIN)  5 MG tablet Take 5 mg by mouth daily at 6 PM.    . HYDROcodone-acetaminophen (NORCO) 10-325 MG tablet Take 1 tablet by mouth 3 (three) times daily as needed for moderate pain or severe pain. 90 tablet 0  . doxycycline (VIBRA-TABS) 100 MG tablet Take 1 tablet (100 mg total) by mouth 2 (two) times daily. (Patient not taking: Reported on 09/14/2015) 20 tablet 0   No facility-administered medications prior to visit.     Review of Systems;  Patient denies headache, fevers, malaise, unintentional weight loss, skin rash, eye pain, sinus congestion and sinus pain, sore throat, dysphagia,  hemoptysis , cough, dyspnea, wheezing, chest pain, palpitations, orthopnea, edema, abdominal pain, nausea, melena, diarrhea, constipation,  flank pain, dysuria, hematuria, urinary  Frequency, nocturia, numbness, tingling, seizures,  Focal weakness, Loss of consciousness,  Tremor, insomnia, depression, anxiety, and suicidal ideation.      Objective:  BP (!) 148/80   Pulse 86   Temp 97.8 F (36.6 C) (Oral)   Resp 12   Ht 5\' 3"  (1.6 m)   Wt 101 lb 8 oz (46 kg)   SpO2 93%   BMI 17.98 kg/m   BP Readings from Last 3 Encounters:  09/14/15 (!) 148/80  09/05/15 (!) 111/58  08/28/15 (!) 156/88    Wt Readings from Last 3 Encounters:  09/14/15 101 lb 8 oz (46 kg)  09/05/15 104 lb 9.6 oz (47.4 kg)  08/28/15 104 lb (47.2 kg)    General appearance: alert, cooperative and appears stated age Ears: normal TM's and external ear canals both ears Throat: lips, mucosa, and tongue normal; teeth and gums normal Neck: no adenopathy, no carotid bruit, supple, symmetrical, trachea midline and thyroid not enlarged, symmetric, no tenderness/mass/nodules Back: symmetric, no curvature. ROM normal. No CVA tenderness. Lungs: clear to auscultation bilaterally Heart: regular rate and rhythm, S1, S2 normal, no murmur, click, rub or gallop Abdomen: soft, non-tender; bowel sounds normal; no masses,  no organomegaly Pulses: 2+ and symmetric Skin: Skin color, texture, turgor normal. No rashes or lesions Lymph nodes: Cervical, supraclavicular, and axillary nodes normal.  No results found for: HGBA1C  Lab Results  Component Value Date   CREATININE 0.99 (H) 08/17/2015   CREATININE 0.82 07/03/2015   CREATININE 0.71 06/16/2015    Lab Results  Component Value Date   WBC 5.1 08/17/2015   HGB 11.9 08/17/2015   HCT 37.2 08/17/2015   PLT 178 08/17/2015   GLUCOSE 145 (H) 08/17/2015   CHOL 117 06/01/2014   TRIG 68 06/01/2014   HDL 43 06/01/2014   LDLCALC 60 06/01/2014   ALT 16 08/17/2015   AST 24 08/17/2015   NA 139 08/17/2015   K 4.4 08/17/2015   CL 102 08/17/2015   CREATININE 0.99 (H) 08/17/2015   BUN 26 (H) 08/17/2015   CO2 25  08/17/2015   TSH 3.016 03/26/2015   INR 1.8 (A) 09/12/2015    Dg Chest 2 View  Result Date: 06/13/2015 CLINICAL DATA:  Shortness of Breath EXAM: CHEST  2 VIEW COMPARISON:  05/14/2015 FINDINGS: Cardiac shadow is enlarged but stable. A pacing device is again seen. Chronic changes in the right lung base are again noted. Some slight increased density in the left lung base is noted consistent with early infiltrate. Mild right midlung atelectasis is noted as well. IMPRESSION: Left basilar infiltrate with associated small effusion. Chronic changes in the right base. Electronically Signed   By: Alcide CleverMark  Lukens M.D.   On: 06/13/2015 16:29    Assessment &  Plan:   Problem List Items Addressed This Visit    Essential hypertension, benign (Chronic)    Mild elevation today due to pain.   No changes to regiemn.  Lab Results  Component Value Date   CREATININE 0.99 (H) 08/17/2015   Lab Results  Component Value Date   NA 139 08/17/2015   K 4.4 08/17/2015   CL 102 08/17/2015   CO2 25 08/17/2015         Chronic pain syndrome (Chronic)    Secondary to scoliosis  , requiring use of walker and sedentary lifestyle.  pain is not well controlled on 3 Vicodin ES and 4 tramadol daily,  Hospice/Palliative Care referral has been made for assistance with pain management,.  Refills for 3 months given. .       Chronic anticoagulation    Coumadin level is slightly subtherapeutic,  Continue current regimen and repeat PT/INR in one week       Other Visit Diagnoses   None.     I am having Ms. Finchum maintain her dorzolamide-timolol, polyethylene glycol, atenolol, Vitamin D (Ergocalciferol), acetaminophen, Travoprost (BAK Free), feeding supplement (ENSURE ENLIVE), ipratropium-albuterol, docusate sodium, diltiazem, warfarin, levothyroxine, traMADol, doxycycline, furosemide, atorvastatin, potassium chloride, and HYDROcodone-acetaminophen.  Meds ordered this encounter  Medications  . DISCONTD:  HYDROcodone-acetaminophen (NORCO) 10-325 MG tablet    Sig: Take 1 tablet by mouth 3 (three) times daily as needed for moderate pain or severe pain.    Dispense:  90 tablet    Refill:  0    May refill on or after August 21  . DISCONTD: HYDROcodone-acetaminophen (NORCO) 10-325 MG tablet    Sig: Take 1 tablet by mouth 3 (three) times daily as needed for moderate pain or severe pain.    Dispense:  90 tablet    Refill:  0    May refill on or after Sept 21  . HYDROcodone-acetaminophen (NORCO) 10-325 MG tablet    Sig: Take 1 tablet by mouth 3 (three) times daily as needed for moderate pain or severe pain.    Dispense:  90 tablet    Refill:  0    May refill on or after Oct 21  A total of 25 minutes of face to face time was spent with patient more than half of which was spent in counselling about the above mentioned conditions  and coordination of care   Medications Discontinued During This Encounter  Medication Reason  . HYDROcodone-acetaminophen (NORCO) 10-325 MG tablet Reorder  . HYDROcodone-acetaminophen (NORCO) 10-325 MG tablet Reorder  . HYDROcodone-acetaminophen (NORCO) 10-325 MG tablet Reorder    Follow-up: Return in about 3 months (around 08/27/15).   Sherlene ShamsULLO, Jeweliana Dudgeon L, MD

## 2015-09-14 NOTE — Progress Notes (Signed)
Pre-visit discussion using our clinic review tool. No additional management support is needed unless otherwise documented below in the visit note.  

## 2015-09-14 NOTE — Telephone Encounter (Signed)
Call made to specialty scheduling at this time. No answer. Left voicemail for return phone call. 

## 2015-09-14 NOTE — Telephone Encounter (Signed)
Spoke with Britta MccreedyBarbara at this time. She states that she does not have a nurse present and is not going to be able to give me a new date until the nurse returns on Monday.

## 2015-09-14 NOTE — Patient Instructions (Signed)
I am refilling your Vicodin for 3 months  If the Hospice people  Offer to manage your pain , I would be happy to work with them

## 2015-09-16 NOTE — Assessment & Plan Note (Signed)
Secondary to scoliosis  , requiring use of walker and sedentary lifestyle.  pain is not well controlled on 3 Vicodin ES and 4 tramadol daily,  Hospice/Palliative Care referral has been made for assistance with pain management,.  Refills for 3 months given. .[name]

## 2015-09-16 NOTE — Assessment & Plan Note (Signed)
Coumadin level is slightly subtherapeutic,  Continue current regimen and repeat PT/INR in one week

## 2015-09-16 NOTE — Assessment & Plan Note (Signed)
Mild elevation today due to pain.   No changes to regiemn.  Lab Results  Component Value Date   CREATININE 0.99 (H) 08/17/2015   Lab Results  Component Value Date   NA 139 08/17/2015   K 4.4 08/17/2015   CL 102 08/17/2015   CO2 25 08/17/2015

## 2015-09-18 ENCOUNTER — Ambulatory Visit
Admission: RE | Admit: 2015-09-18 | Discharge: 2015-09-18 | Disposition: A | Discharge status: Home / Self Care | Payer: Medicare Other | Source: Ambulatory Visit | Attending: Ophthalmology | Admitting: Ophthalmology

## 2015-09-18 DIAGNOSIS — G453 Amaurosis fugax: Secondary | ICD-10-CM | POA: Diagnosis not present

## 2015-09-19 LAB — PROTIME-INR: INR: 2.6 — AB (ref 0.9–1.1)

## 2015-09-19 NOTE — Telephone Encounter (Signed)
Laurie BabinskiMarilyn, Daughter, has called and was checking on the status of the previous note. She would like to be contacted on her cell phone that has been verified.

## 2015-09-19 NOTE — Telephone Encounter (Signed)
Spoke with Britta MccreedyBarbara from Scheduling at this time. Patient's appointment has been moved to Friday, 10/05/15 at 0800. Patient needs to check in at Registration desk in [facility] at 0700.  She will also need to have labs drawn within 1 week of scheduled procedure.  Spoke with patient's daughter and she states that she cannot do this date either. Call returned to Scheduling at this time. No answer. Left voicemail for return phone call to reschedule this testing.

## 2015-09-20 ENCOUNTER — Ambulatory Visit (INDEPENDENT_AMBULATORY_CARE_PROVIDER_SITE_OTHER): Payer: Medicare Other

## 2015-09-20 DIAGNOSIS — I4891 Unspecified atrial fibrillation: Secondary | ICD-10-CM

## 2015-09-21 NOTE — Telephone Encounter (Signed)
Spoke with specialty scheduling at this time. Darel HongJudy will contact patient's daughter, Jola BabinskiMarilyn directly to schedule this testing.

## 2015-09-26 ENCOUNTER — Telehealth: Payer: Self-pay | Admitting: *Deleted

## 2015-09-26 NOTE — Telephone Encounter (Signed)
Please advise, thanks Last OV was 8/18

## 2015-09-26 NOTE — Telephone Encounter (Signed)
VERBAL ORDER FOR hOSPICE GIVEN

## 2015-09-26 NOTE — Telephone Encounter (Signed)
Left a detailed message that verbal for hospice given, thanks

## 2015-09-26 NOTE — Telephone Encounter (Signed)
Patient family has requested to have pt on Hospice, family requested to have Dr. Darrick Huntsmanullo follow pt rather than pulmonary.  Hospice of  has requested a verbal order, and will follow through with faxing all needed paper work to the office.  Contact Amil AmenJulia  [phone]

## 2015-09-28 ENCOUNTER — Ambulatory Visit: Payer: Medicare Other

## 2015-09-28 NOTE — Telephone Encounter (Signed)
Hospice faxed over documents, a second time with the corrected date of 09/27/15. Please fax the second copy back over  to [phone] FYI the first documents sent over by hospice had the wrong date on their behalf.

## 2015-09-28 NOTE — Telephone Encounter (Signed)
We have received paperwork, awaiting signature from Dr. Darrick Huntsmanullo.  Will Fax back once finished.

## 2015-09-28 NOTE — Telephone Encounter (Signed)
We have not received fax yet

## 2015-09-28 NOTE — Telephone Encounter (Signed)
Paperwork has been faxed by [name]

## 2015-10-04 ENCOUNTER — Other Ambulatory Visit: Payer: Self-pay | Admitting: Cardiology

## 2015-10-04 LAB — PROTIME-INR: INR: 1.7 — AB (ref 0.9–1.1)

## 2015-10-05 ENCOUNTER — Ambulatory Visit (INDEPENDENT_AMBULATORY_CARE_PROVIDER_SITE_OTHER): Payer: Medicare Other | Admitting: Interventional Cardiology

## 2015-10-05 ENCOUNTER — Ambulatory Visit: Payer: Medicare Other

## 2015-10-05 DIAGNOSIS — I4891 Unspecified atrial fibrillation: Secondary | ICD-10-CM

## 2015-10-10 ENCOUNTER — Telehealth: Payer: Self-pay | Admitting: Internal Medicine

## 2015-10-10 MED ORDER — MORPHINE SULFATE (CONCENTRATE) 10 MG /0.5 ML PO SOLN: 2.5000 mg | ORAL | 0 refills | Status: DC | PRN

## 2015-10-10 MED ORDER — DEXAMETHASONE 2 MG PO TABS: 2.0000 mg | ORAL_TABLET | Freq: Two times a day (BID) | ORAL | 2 refills | Status: DC

## 2015-10-10 MED ORDER — LORAZEPAM 0.5 MG PO TABS: 0.2500 mg | ORAL_TABLET | Freq: Every day | ORAL | 3 refills | Status: DC

## 2015-10-10 NOTE — Telephone Encounter (Signed)
Faxed Rx and forms to Hospice care

## 2015-10-10 NOTE — Telephone Encounter (Signed)
Please be on the lookout for a fax from her regarding this in Dr. Melina Schoolsullo's Mailbox, thanks

## 2015-10-10 NOTE — Telephone Encounter (Signed)
Laurie CanterburyLeigh Larsen [phone] called from [facility] regarding unmanaged symptom of chronic back pain and sob related to copd. [name] will fax a letter today of recommendation from their pharmacist. Thank you!

## 2015-10-17 LAB — PROTIME-INR: INR: 2.7 — AB (ref 0.9–1.1)

## 2015-10-18 ENCOUNTER — Ambulatory Visit (INDEPENDENT_AMBULATORY_CARE_PROVIDER_SITE_OTHER): Payer: Medicare Other | Admitting: Cardiovascular Disease

## 2015-10-18 DIAGNOSIS — I4891 Unspecified atrial fibrillation: Secondary | ICD-10-CM

## 2015-10-19 ENCOUNTER — Ambulatory Visit: Payer: Medicare Other

## 2015-10-19 ENCOUNTER — Ambulatory Visit (HOSPITAL_COMMUNITY): Payer: Medicare Other

## 2015-10-19 ENCOUNTER — Ambulatory Visit: Admission: RE | Admit: 2015-10-19 | Payer: Medicare Other | Source: Ambulatory Visit

## 2015-10-19 ENCOUNTER — Encounter: Payer: Self-pay | Admitting: Interventional Radiology

## 2015-10-19 ENCOUNTER — Ambulatory Visit
Admission: RE | Admit: 2015-10-19 | Discharge: 2015-10-19 | Disposition: A | Discharge status: Home / Self Care | Payer: Medicare Other | Source: Ambulatory Visit | Attending: General Surgery | Admitting: General Surgery

## 2015-10-19 DIAGNOSIS — K811 Chronic cholecystitis: Secondary | ICD-10-CM | POA: Insufficient documentation

## 2015-10-19 DIAGNOSIS — Z434 Encounter for attention to other artificial openings of digestive tract: Secondary | ICD-10-CM | POA: Insufficient documentation

## 2015-10-19 HISTORY — PX: IR GENERIC HISTORICAL: IMG1180011

## 2015-10-19 MED ORDER — IOHEXOL 300 MG/ML  SOLN
10.0000 mL | Freq: Once | INTRAMUSCULAR | Status: AC | PRN
  Administered 2015-10-19: 20 mL

## 2015-10-19 NOTE — Procedures (Signed)
Cholangiogram through the existing cholecystostomy.  Cystic duct and common bile duct are patent. Contrast drains easily into the duodenum. No filling defect or obstruction.  Therefore, the catheter was removed.  No comp location  EBL 0  Full report in PACs

## 2015-10-22 ENCOUNTER — Telehealth: Payer: Self-pay | Admitting: General Surgery

## 2015-10-22 DIAGNOSIS — G8929 Other chronic pain: Secondary | ICD-10-CM

## 2015-10-22 DIAGNOSIS — R1011 Right upper quadrant pain: Principal | ICD-10-CM

## 2015-10-22 NOTE — Telephone Encounter (Signed)
Friday drainage tube removed and she has been nauseated ever since. What can she do? Please call.

## 2015-10-23 NOTE — Telephone Encounter (Signed)
Returned phone call to patient at this time. RUQ abdominal pain, nausea intermittently, denies fever, denies vomiting. Bowels are moving 1-2 times daily after taking stool and Miralax. Patient states that bowels are normal for her.  Has Cholecystostomy drain pulled on 10/19/15 and patient developed all symptoms above immediately after pulling drain. Cholecystostomy was capped for 2 months or more prior to drain pull and did not have these symptoms per patient.

## 2015-10-23 NOTE — Telephone Encounter (Signed)
Spoke with Dr. Tonita CongWoodham regarding this patient. He has ordered a HIDA scan.  HIDA scheduled 11/02/15 at 11am. [facility] and have nothing by mouth after midnight.   Medication sent to pharmacy at this time to help with nausea in the meantime.  If patient develops pain prior to this, she was advised to go to the Emergency Room.

## 2015-10-24 ENCOUNTER — Emergency Department

## 2015-10-24 ENCOUNTER — Encounter: Payer: Self-pay | Admitting: Intensive Care

## 2015-10-24 ENCOUNTER — Inpatient Hospital Stay (HOSPITAL_COMMUNITY)
Admit: 2015-10-24 | Discharge: 2015-10-24 | Disposition: A | Discharge status: Home / Self Care | Attending: Internal Medicine | Admitting: Internal Medicine

## 2015-10-24 ENCOUNTER — Inpatient Hospital Stay
Admission: EM | Admit: 2015-10-24 | Discharge: 2015-10-30 | DRG: 291 | Disposition: A | Discharge status: Skilled Nursing Facility | Attending: Internal Medicine | Admitting: Internal Medicine

## 2015-10-24 ENCOUNTER — Inpatient Hospital Stay: Admit: 2015-10-24

## 2015-10-24 DIAGNOSIS — Z9889 Other specified postprocedural states: Secondary | ICD-10-CM

## 2015-10-24 DIAGNOSIS — Z9119 Patient's noncompliance with other medical treatment and regimen: Secondary | ICD-10-CM

## 2015-10-24 DIAGNOSIS — I110 Hypertensive heart disease with heart failure: Secondary | ICD-10-CM | POA: Diagnosis present

## 2015-10-24 DIAGNOSIS — Z950 Presence of cardiac pacemaker: Secondary | ICD-10-CM | POA: Diagnosis not present

## 2015-10-24 DIAGNOSIS — J9622 Acute and chronic respiratory failure with hypercapnia: Secondary | ICD-10-CM | POA: Diagnosis present

## 2015-10-24 DIAGNOSIS — E785 Hyperlipidemia, unspecified: Secondary | ICD-10-CM | POA: Diagnosis present

## 2015-10-24 DIAGNOSIS — Z66 Do not resuscitate: Secondary | ICD-10-CM | POA: Diagnosis present

## 2015-10-24 DIAGNOSIS — I442 Atrioventricular block, complete: Secondary | ICD-10-CM | POA: Diagnosis present

## 2015-10-24 DIAGNOSIS — E875 Hyperkalemia: Secondary | ICD-10-CM | POA: Diagnosis present

## 2015-10-24 DIAGNOSIS — J449 Chronic obstructive pulmonary disease, unspecified: Secondary | ICD-10-CM | POA: Diagnosis present

## 2015-10-24 DIAGNOSIS — Z8711 Personal history of peptic ulcer disease: Secondary | ICD-10-CM

## 2015-10-24 DIAGNOSIS — A419 Sepsis, unspecified organism: Secondary | ICD-10-CM | POA: Diagnosis present

## 2015-10-24 DIAGNOSIS — Z8249 Family history of ischemic heart disease and other diseases of the circulatory system: Secondary | ICD-10-CM | POA: Diagnosis not present

## 2015-10-24 DIAGNOSIS — Z833 Family history of diabetes mellitus: Secondary | ICD-10-CM | POA: Diagnosis not present

## 2015-10-24 DIAGNOSIS — R0600 Dyspnea, unspecified: Secondary | ICD-10-CM

## 2015-10-24 DIAGNOSIS — I5033 Acute on chronic diastolic (congestive) heart failure: Secondary | ICD-10-CM | POA: Diagnosis not present

## 2015-10-24 DIAGNOSIS — J9601 Acute respiratory failure with hypoxia: Secondary | ICD-10-CM

## 2015-10-24 DIAGNOSIS — T82897A Other specified complication of cardiac prosthetic devices, implants and grafts, initial encounter: Secondary | ICD-10-CM | POA: Diagnosis present

## 2015-10-24 DIAGNOSIS — I482 Chronic atrial fibrillation: Secondary | ICD-10-CM | POA: Diagnosis present

## 2015-10-24 DIAGNOSIS — N179 Acute kidney failure, unspecified: Secondary | ICD-10-CM | POA: Diagnosis present

## 2015-10-24 DIAGNOSIS — I495 Sick sinus syndrome: Secondary | ICD-10-CM | POA: Diagnosis present

## 2015-10-24 DIAGNOSIS — Z90710 Acquired absence of both cervix and uterus: Secondary | ICD-10-CM | POA: Diagnosis not present

## 2015-10-24 DIAGNOSIS — Z79899 Other long term (current) drug therapy: Secondary | ICD-10-CM

## 2015-10-24 DIAGNOSIS — Z789 Other specified health status: Secondary | ICD-10-CM | POA: Diagnosis not present

## 2015-10-24 DIAGNOSIS — Z515 Encounter for palliative care: Secondary | ICD-10-CM

## 2015-10-24 DIAGNOSIS — Z880 Allergy status to penicillin: Secondary | ICD-10-CM

## 2015-10-24 DIAGNOSIS — Z888 Allergy status to other drugs, medicaments and biological substances status: Secondary | ICD-10-CM

## 2015-10-24 DIAGNOSIS — M1990 Unspecified osteoarthritis, unspecified site: Secondary | ICD-10-CM | POA: Diagnosis present

## 2015-10-24 DIAGNOSIS — M419 Scoliosis, unspecified: Secondary | ICD-10-CM | POA: Diagnosis present

## 2015-10-24 DIAGNOSIS — G8929 Other chronic pain: Secondary | ICD-10-CM | POA: Diagnosis present

## 2015-10-24 DIAGNOSIS — R791 Abnormal coagulation profile: Secondary | ICD-10-CM | POA: Diagnosis present

## 2015-10-24 DIAGNOSIS — J9811 Atelectasis: Secondary | ICD-10-CM | POA: Diagnosis present

## 2015-10-24 DIAGNOSIS — K5900 Constipation, unspecified: Secondary | ICD-10-CM | POA: Diagnosis present

## 2015-10-24 DIAGNOSIS — J9621 Acute and chronic respiratory failure with hypoxia: Secondary | ICD-10-CM | POA: Diagnosis present

## 2015-10-24 DIAGNOSIS — R2681 Unsteadiness on feet: Secondary | ICD-10-CM

## 2015-10-24 DIAGNOSIS — R6521 Severe sepsis with septic shock: Secondary | ICD-10-CM | POA: Diagnosis present

## 2015-10-24 DIAGNOSIS — R262 Difficulty in walking, not elsewhere classified: Secondary | ICD-10-CM

## 2015-10-24 DIAGNOSIS — Z9981 Dependence on supplemental oxygen: Secondary | ICD-10-CM

## 2015-10-24 DIAGNOSIS — Z45010 Encounter for checking and testing of cardiac pacemaker pulse generator [battery]: Secondary | ICD-10-CM

## 2015-10-24 DIAGNOSIS — E872 Acidosis: Secondary | ICD-10-CM | POA: Diagnosis present

## 2015-10-24 DIAGNOSIS — I481 Persistent atrial fibrillation: Secondary | ICD-10-CM | POA: Diagnosis present

## 2015-10-24 DIAGNOSIS — Z823 Family history of stroke: Secondary | ICD-10-CM | POA: Diagnosis not present

## 2015-10-24 DIAGNOSIS — T50905A Adverse effect of unspecified drugs, medicaments and biological substances, initial encounter: Secondary | ICD-10-CM | POA: Diagnosis present

## 2015-10-24 DIAGNOSIS — I509 Heart failure, unspecified: Secondary | ICD-10-CM

## 2015-10-24 DIAGNOSIS — R001 Bradycardia, unspecified: Secondary | ICD-10-CM | POA: Diagnosis present

## 2015-10-24 DIAGNOSIS — Z7901 Long term (current) use of anticoagulants: Secondary | ICD-10-CM | POA: Diagnosis not present

## 2015-10-24 DIAGNOSIS — I2510 Atherosclerotic heart disease of native coronary artery without angina pectoris: Secondary | ICD-10-CM | POA: Diagnosis present

## 2015-10-24 DIAGNOSIS — I4891 Unspecified atrial fibrillation: Secondary | ICD-10-CM | POA: Diagnosis present

## 2015-10-24 DIAGNOSIS — Z801 Family history of malignant neoplasm of trachea, bronchus and lung: Secondary | ICD-10-CM

## 2015-10-24 DIAGNOSIS — R0602 Shortness of breath: Secondary | ICD-10-CM

## 2015-10-24 DIAGNOSIS — Z808 Family history of malignant neoplasm of other organs or systems: Secondary | ICD-10-CM

## 2015-10-24 DIAGNOSIS — M6281 Muscle weakness (generalized): Secondary | ICD-10-CM

## 2015-10-24 LAB — COMPREHENSIVE METABOLIC PANEL
ALBUMIN: 3.6 g/dL (ref 3.5–5.0)
ALK PHOS: 92 U/L (ref 38–126)
ALT: 30 U/L (ref 14–54)
AST: 36 U/L (ref 15–41)
Anion gap: 13 (ref 5–15)
BUN: 41 mg/dL — AB (ref 6–20)
CALCIUM: 8.4 mg/dL — AB (ref 8.9–10.3)
CO2: 17 mmol/L — AB (ref 22–32)
CREATININE: 1.82 mg/dL — AB (ref 0.44–1.00)
Chloride: 104 mmol/L (ref 101–111)
GFR calc Af Amer: 27 mL/min — ABNORMAL LOW (ref >60)
GFR calc non Af Amer: 23 mL/min — ABNORMAL LOW (ref >60)
GLUCOSE: 153 mg/dL — AB (ref 65–99)
Potassium: 5.6 mmol/L — ABNORMAL HIGH (ref 3.5–5.1)
SODIUM: 134 mmol/L — AB (ref 135–145)
Total Bilirubin: 0.6 mg/dL (ref 0.3–1.2)
Total Protein: 6.7 g/dL (ref 6.5–8.1)

## 2015-10-24 LAB — TROPONIN I: Troponin I: 0.03 ng/mL (ref <0.03)

## 2015-10-24 LAB — CBC WITH DIFFERENTIAL/PLATELET
BASOS ABS: 0.1 10*3/uL (ref 0–0.1)
BASOS PCT: 1 %
Eosinophils Absolute: 0.2 10*3/uL (ref 0–0.7)
Eosinophils Relative: 2 %
HEMATOCRIT: 42.9 % (ref 35.0–47.0)
HEMOGLOBIN: 14.3 g/dL (ref 12.0–16.0)
LYMPHS PCT: 10 %
Lymphs Abs: 1.1 10*3/uL (ref 1.0–3.6)
MCH: 31.6 pg (ref 26.0–34.0)
MCHC: 33.5 g/dL (ref 32.0–36.0)
MCV: 94.3 fL (ref 80.0–100.0)
MONOS PCT: 12 %
Monocytes Absolute: 1.3 10*3/uL — ABNORMAL HIGH (ref 0.2–0.9)
NEUTROS ABS: 8.2 10*3/uL — AB (ref 1.4–6.5)
NEUTROS PCT: 75 %
Platelets: 221 10*3/uL (ref 150–440)
RBC: 4.55 MIL/uL (ref 3.80–5.20)
RDW: 16.1 % — ABNORMAL HIGH (ref 11.5–14.5)
WBC: 10.9 10*3/uL (ref 3.6–11.0)

## 2015-10-24 LAB — URINALYSIS COMPLETE WITH MICROSCOPIC (ARMC ONLY)
Bilirubin Urine: NEGATIVE
GLUCOSE, UA: 50 mg/dL — AB
Hgb urine dipstick: NEGATIVE
KETONES UR: NEGATIVE mg/dL
Nitrite: NEGATIVE
Protein, ur: 30 mg/dL — AB
SPECIFIC GRAVITY, URINE: 1.011 (ref 1.005–1.030)
pH: 5 (ref 5.0–8.0)

## 2015-10-24 LAB — LACTIC ACID, PLASMA: Lactic Acid, Venous: 3.2 mmol/L (ref 0.5–1.9)

## 2015-10-24 LAB — GLUCOSE, CAPILLARY: Glucose-Capillary: 159 mg/dL — ABNORMAL HIGH (ref 65–99)

## 2015-10-24 LAB — POTASSIUM: POTASSIUM: 6.6 mmol/L — AB (ref 3.5–5.1)

## 2015-10-24 LAB — MRSA PCR SCREENING: MRSA BY PCR: NEGATIVE

## 2015-10-24 LAB — LIPASE, BLOOD: Lipase: 35 U/L (ref 11–51)

## 2015-10-24 LAB — PROTIME-INR
INR: 1.94
Prothrombin Time: 22.4 seconds — ABNORMAL HIGH (ref 11.4–15.2)

## 2015-10-24 LAB — MAGNESIUM: Magnesium: 2.3 mg/dL (ref 1.7–2.4)

## 2015-10-24 LAB — BRAIN NATRIURETIC PEPTIDE: B Natriuretic Peptide: 1549 pg/mL — ABNORMAL HIGH (ref 0.0–100.0)

## 2015-10-24 MED ORDER — SENNOSIDES-DOCUSATE SODIUM 8.6-50 MG PO TABS
1.0000 | ORAL_TABLET | Freq: Every evening | ORAL | Status: DC | PRN
  Administered 2015-10-30: 1 via ORAL
  Filled 2015-10-24: qty 1

## 2015-10-24 MED ORDER — DOCUSATE SODIUM 100 MG PO CAPS
100.0000 mg | ORAL_CAPSULE | Freq: Two times a day (BID) | ORAL | Status: DC
  Administered 2015-10-24 – 2015-10-30 (×13): 100 mg via ORAL
  Filled 2015-10-24 (×14): qty 1

## 2015-10-24 MED ORDER — ONDANSETRON HCL 4 MG/2ML IJ SOLN
4.0000 mg | Freq: Four times a day (QID) | INTRAMUSCULAR | Status: DC | PRN
  Administered 2015-10-24: 4 mg via INTRAVENOUS
  Filled 2015-10-24: qty 2

## 2015-10-24 MED ORDER — INFLUENZA VAC SPLIT QUAD 0.5 ML IM SUSY: 0.5000 mL | PREFILLED_SYRINGE | INTRAMUSCULAR | Status: DC

## 2015-10-24 MED ORDER — IPRATROPIUM-ALBUTEROL 0.5-2.5 (3) MG/3ML IN SOLN
3.0000 mL | Freq: Four times a day (QID) | RESPIRATORY_TRACT | Status: DC
  Administered 2015-10-24 – 2015-10-27 (×12): 3 mL via RESPIRATORY_TRACT
  Filled 2015-10-24 (×13): qty 3

## 2015-10-24 MED ORDER — SODIUM CHLORIDE 0.9 % IV SOLN: 250.0000 mL | INTRAVENOUS | Status: DC | PRN

## 2015-10-24 MED ORDER — INSULIN REGULAR HUMAN 100 UNIT/ML IJ SOLN
10.0000 [IU] | Freq: Once | INTRAMUSCULAR | Status: DC
  Filled 2015-10-24: qty 0.1

## 2015-10-24 MED ORDER — INSULIN ASPART 100 UNIT/ML IV SOLN
10.0000 [IU] | Freq: Once | INTRAVENOUS | Status: DC
  Filled 2015-10-24: qty 0.1

## 2015-10-24 MED ORDER — INSULIN ASPART 100 UNIT/ML ~~LOC~~ SOLN
10.0000 [IU] | Freq: Once | SUBCUTANEOUS | Status: AC
  Administered 2015-10-24: 10 [IU] via INTRAVENOUS
  Filled 2015-10-24: qty 10

## 2015-10-24 MED ORDER — ACETAMINOPHEN 325 MG PO TABS
650.0000 mg | ORAL_TABLET | Freq: Four times a day (QID) | ORAL | Status: DC | PRN
  Administered 2015-10-27 – 2015-10-28 (×2): 650 mg via ORAL
  Filled 2015-10-24 (×2): qty 2

## 2015-10-24 MED ORDER — IPRATROPIUM-ALBUTEROL 0.5-2.5 (3) MG/3ML IN SOLN
3.0000 mL | Freq: Once | RESPIRATORY_TRACT | Status: AC
  Administered 2015-10-24: 3 mL via RESPIRATORY_TRACT
  Filled 2015-10-24: qty 3

## 2015-10-24 MED ORDER — LISINOPRIL 5 MG PO TABS: 2.5000 mg | ORAL_TABLET | Freq: Every day | ORAL | Status: DC

## 2015-10-24 MED ORDER — MORPHINE SULFATE (CONCENTRATE) 10 MG/0.5ML PO SOLN
2.5000 mg | ORAL | Status: DC | PRN
Start: 2015-10-24 — End: 2015-10-30
  Filled 2015-10-24: qty 1

## 2015-10-24 MED ORDER — TIMOLOL MALEATE 0.5 % OP SOLN
1.0000 [drp] | Freq: Two times a day (BID) | OPHTHALMIC | Status: DC
  Filled 2015-10-24: qty 5

## 2015-10-24 MED ORDER — METHYLPREDNISOLONE SODIUM SUCC 125 MG IJ SOLR
125.0000 mg | Freq: Once | INTRAMUSCULAR | Status: AC
  Administered 2015-10-24: 125 mg via INTRAVENOUS
  Filled 2015-10-24: qty 2

## 2015-10-24 MED ORDER — HYDROCODONE-ACETAMINOPHEN 10-325 MG PO TABS
1.0000 | ORAL_TABLET | Freq: Three times a day (TID) | ORAL | Status: DC | PRN
  Administered 2015-10-24 – 2015-10-30 (×9): 1 via ORAL
  Filled 2015-10-24 (×9): qty 1

## 2015-10-24 MED ORDER — DEXTROSE 5 % IV SOLN
1.0000 g | Freq: Once | INTRAVENOUS | Status: AC
  Administered 2015-10-24: 1 g via INTRAVENOUS
  Filled 2015-10-24: qty 10

## 2015-10-24 MED ORDER — SODIUM BICARBONATE 8.4 % IV SOLN
50.0000 meq | Freq: Once | INTRAVENOUS | Status: AC
  Administered 2015-10-24: 50 meq via INTRAVENOUS
  Filled 2015-10-24: qty 50

## 2015-10-24 MED ORDER — ATORVASTATIN CALCIUM 20 MG PO TABS
20.0000 mg | ORAL_TABLET | Freq: Every day | ORAL | Status: DC
  Administered 2015-10-24 – 2015-10-29 (×6): 20 mg via ORAL
  Filled 2015-10-24 (×6): qty 1

## 2015-10-24 MED ORDER — LEVOTHYROXINE SODIUM 112 MCG PO TABS
112.0000 ug | ORAL_TABLET | Freq: Every day | ORAL | Status: DC
  Administered 2015-10-24 – 2015-10-30 (×7): 112 ug via ORAL
  Filled 2015-10-24 (×7): qty 1

## 2015-10-24 MED ORDER — SODIUM CHLORIDE 0.9% FLUSH: 3.0000 mL | INTRAVENOUS | Status: DC | PRN

## 2015-10-24 MED ORDER — SODIUM CHLORIDE 0.9% FLUSH
3.0000 mL | Freq: Two times a day (BID) | INTRAVENOUS | Status: DC
  Administered 2015-10-24 – 2015-10-30 (×11): 3 mL via INTRAVENOUS

## 2015-10-24 MED ORDER — FUROSEMIDE 10 MG/ML IJ SOLN
40.0000 mg | Freq: Once | INTRAMUSCULAR | Status: AC
  Administered 2015-10-24: 40 mg via INTRAVENOUS
  Filled 2015-10-24: qty 4

## 2015-10-24 MED ORDER — DEXTROSE 5 % IV SOLN
500.0000 mg | Freq: Once | INTRAVENOUS | Status: AC
  Administered 2015-10-24: 500 mg via INTRAVENOUS
  Filled 2015-10-24: qty 500

## 2015-10-24 MED ORDER — WARFARIN - PHARMACIST DOSING INPATIENT
Freq: Every day | Status: DC
  Administered 2015-10-24 – 2015-10-29 (×2)

## 2015-10-24 MED ORDER — ENSURE ENLIVE PO LIQD
237.0000 mL | Freq: Two times a day (BID) | ORAL | Status: DC
  Administered 2015-10-25 – 2015-10-30 (×9): 237 mL via ORAL

## 2015-10-24 MED ORDER — ALBUTEROL SULFATE (2.5 MG/3ML) 0.083% IN NEBU: 2.5000 mg | INHALATION_SOLUTION | RESPIRATORY_TRACT | Status: DC | PRN

## 2015-10-24 MED ORDER — FUROSEMIDE 10 MG/ML IJ SOLN
20.0000 mg | Freq: Two times a day (BID) | INTRAMUSCULAR | Status: DC
  Administered 2015-10-24 – 2015-10-30 (×12): 20 mg via INTRAVENOUS
  Filled 2015-10-24 (×12): qty 2

## 2015-10-24 MED ORDER — DEXTROSE 50 % IV SOLN
25.0000 mL | Freq: Once | INTRAVENOUS | Status: AC
  Administered 2015-10-24: 25 mL via INTRAVENOUS
  Filled 2015-10-24: qty 50

## 2015-10-24 MED ORDER — DORZOLAMIDE HCL-TIMOLOL MAL 2-0.5 % OP SOLN: 1.0000 [drp] | Freq: Two times a day (BID) | OPHTHALMIC | Status: DC

## 2015-10-24 MED ORDER — DORZOLAMIDE HCL 2 % OP SOLN
1.0000 [drp] | Freq: Two times a day (BID) | OPHTHALMIC | Status: DC
  Administered 2015-10-24 – 2015-10-25 (×3): 1 [drp] via OPHTHALMIC
  Filled 2015-10-24: qty 10

## 2015-10-24 MED ORDER — POLYETHYLENE GLYCOL 3350 17 G PO PACK
17.0000 g | PACK | Freq: Every day | ORAL | Status: DC
  Administered 2015-10-24 – 2015-10-26 (×3): 17 g via ORAL
  Filled 2015-10-24 (×3): qty 1

## 2015-10-24 MED ORDER — DEXTROSE 50 % IV SOLN
50.0000 mL | Freq: Once | INTRAVENOUS | Status: AC
  Administered 2015-10-24: 50 mL via INTRAVENOUS
  Filled 2015-10-24: qty 50

## 2015-10-24 MED ORDER — WARFARIN SODIUM 5 MG PO TABS
5.0000 mg | ORAL_TABLET | Freq: Once | ORAL | Status: AC
  Administered 2015-10-24: 5 mg via ORAL
  Filled 2015-10-24: qty 1

## 2015-10-24 MED ORDER — ACETAMINOPHEN 650 MG RE SUPP: 650.0000 mg | Freq: Four times a day (QID) | RECTAL | Status: DC | PRN

## 2015-10-24 MED ORDER — ONDANSETRON HCL 4 MG PO TABS: 4.0000 mg | ORAL_TABLET | Freq: Four times a day (QID) | ORAL | Status: DC | PRN

## 2015-10-24 NOTE — ED Provider Notes (Signed)
[facility] Emergency Department Provider Note  ____________________________________________  Time seen: Approximately 9:13 AM  I have reviewed the triage vital signs and the nursing notes.   HISTORY  Chief Complaint Weakness  Level 5 caveat:  Portions of the history and physical were unable to be obtained due to AMS   HPI Laurie Larsen is a 83 y.o. female DNR/DNI, with a history of CHF, COPD, acalculous cholecystitis, atrial fibrillation, bradycardia and tachycardia dysrhythmia status post pacemaker who presents for evaluation of generalized weakness. Patient had her perc chole tube removed on Friday and since then has had progressively worsening weakness. History is gathered mostly from patient's son as patient is unable to provide any history at this time due to her respiratory status. Son reports the patient is noncompliant with her breathing treatments or using her oxygen at home. She was diagnosed with a calculus cholecystitis last year in September and upper tube was placed this patient is a poor surgical candidate. She was deemed appropriate for removal of the tube this past Friday. Patient's son is not sure if patient has been taking her medications. Today EMS was called for shortness of breath and fatigue. Upon arrival to the ED patient is satting in the low 80s with minimal response on a nonrebreather. Patient just tells me that she feels very weak but is unable to give me any other history.  Past Medical History:  Diagnosis Date  . Acalculous cholecystitis   . Allergy   . Arthritis   . Back complaints   . Bronchitis   . CAD (coronary artery disease)    a. 2005 Cath: 100% dLCX-->Med managed.  . CHF (congestive heart failure) (HCC)   . Chronic diastolic CHF (congestive heart failure), NYHA class 1 (HCC)    a. 04/2013 Echo: EF 50-55%, triv MR, mildly dil LA, PASP , mild TR, mild-mod RA dil. b. echo 02/2015: EF 55-60%, mild MR, mild-mod TR, PA  peak pressure 72 mm Hg  . Chronic pain    a. ? due to arthritis  . Chronic Right Pleural Effusion    a. 07/2012 s/p thoracentesis - Followed by Dr. Delford Field [facility]. b. 02/2015: s/p repeat R thoracentesis  . Colon polyps   . Colon polyps   . Diverticulitis large intestine   . Glaucoma   . Hay fever   . History of blood transfusion   . HTN (hypertension)   . Hyperlipidemia   . Hypothyroidism   . Permanent atrial fibrillation (HCC)    a. Chronic Coumadin.  [name] Phlebitis    a. after pacemeker placement in 2010.  . Pulmonary HTN (HCC)    a. PASP on echo 04/2013. b. PASP 72 mmHg on echo in 02/2015  . Rash/skin eruption    a. Multiple episodes - wide distribution-intermittent, possibly drug rash.  . Sick sinus syndrome (HCC)    a. 05/2008 s/p MDT EnRhythm DC PPM ([facility]).  . Transfusion history    a. After childbirth 60 years ago (1957)  . Urine incontinence     Patient Active Problem List   Diagnosis Date Noted  . Acute respiratory failure with hypoxia (HCC) 10/24/2015  . Cholecystostomy care (HCC) 09/05/2015  . Pneumonia 06/13/2015  . DNR (do not resuscitate) 04/06/2015  . Malnutrition of moderate degree 03/26/2015  . Insomnia 02/20/2015  . Macular degeneration 02/20/2015  . Chronic pain syndrome 02/20/2015  . Screening for breast cancer 02/20/2015  . Dyspnea   . Dry eyes 12/14/2014  .  Posterior vitreous detachment of both eyes 12/14/2014  . Glaucoma 11/22/2014  . Protein-calorie malnutrition, severe (HCC) 06/01/2014  . CHF (congestive heart failure), NYHA class I (HCC) 05/31/2014  . Cardiac pacemaker in situ 03/10/2014  . HLD (hyperlipidemia) 03/10/2014  . Nonexudative age-related macular degeneration 12/01/2013  . Status post intraocular lens implant 12/01/2013  . Pulmonary HTN (HCC) 06/09/2013  . Nocturnal hypoxemia 04/20/2013  . Depression 06/23/2011  . Diastolic CHF, chronic (HCC) 11/13/2010  . Recurrent pleural effusion on right 10/01/2010  .  Chronic anticoagulation 07/10/2010  . Hypothyroidism 04/24/2010  . Essential hypertension, benign 03/01/2010  . ATRIAL FIBRILLATION, CHRONIC 03/01/2010    Past Surgical History:  Procedure Laterality Date  . ABDOMINAL HYSTERECTOMY  1977  . ANKLE FRACTURE SURGERY  1979   Left, pin  . APPENDECTOMY  1939  . BREAST BIOPSY  2011  . BREAST FIBROADENOMA SURGERY  2010   Right  . BREAST SURGERY     Needle guided excision, right breast calcification  . IR GENERIC HISTORICAL  10/19/2015   IR CHOLANGIOGRAM EXISTING TUBE 10/19/2015 Berdine Dance, MD [facility]-INTERV RAD  . KIDNEY SURGERY  1955   Right  . PACEMAKER INSERTION  06/20/08   MDT EnRhythm DR implanted by Dr Reyes Ivan  . SKIN GRAFT     left leg  . TONSILLECTOMY  82 yrs old    Prior to Admission medications   Medication Sig Start Date End Date Taking? Authorizing Provider  acetaminophen (TYLENOL) 650 MG CR tablet Take 1,300 mg by mouth at bedtime as needed for pain.    Historical Provider, MD  atenolol (TENORMIN) 25 MG tablet TAKE ONE TABLET TWICE A DAY 10/04/15   Peter M [place], MD  atorvastatin (LIPITOR) 20 MG tablet Take 1 tablet (20 mg total) by mouth at bedtime. 08/28/15   Ellsworth Lennox, PA  dexamethasone (DECADRON) 2 MG tablet Take 1 tablet (2 mg total) by mouth 2 (two) times daily with a meal. Last doe by 2 pm Patient not taking: Reported on 10/19/2015 10/10/15   Sherlene Shams, MD  diltiazem (CARDIZEM CD) 180 MG 24 hr capsule Take 180 mg by mouth daily.    Historical Provider, MD  docusate sodium (COLACE) 100 MG capsule Take 1 capsule (100 mg total) by mouth 2 (two) times daily. 03/29/15   Ramonita Lab, MD  dorzolamide-timolol (COSOPT) 22.3-6.8 MG/ML ophthalmic solution Place 1 drop into both eyes 2 (two) times daily.     Historical Provider, MD  doxycycline (VIBRA-TABS) 100 MG tablet Take 1 tablet (100 mg total) by mouth 2 (two) times daily. Patient not taking: Reported on 10/19/2015 08/20/15   Shelia Media, MD  feeding supplement,  ENSURE ENLIVE, (ENSURE ENLIVE) LIQD Take 237 mLs by mouth 2 (two) times daily with a meal. 03/29/15   Ramonita Lab, MD  furosemide (LASIX) 20 MG tablet Take 1 tablet (20 mg total) by mouth 2 (two) times daily. 08/28/15   Ellsworth Lennox, PA  HYDROcodone-acetaminophen (NORCO) 10-325 MG tablet Take 1 tablet by mouth 3 (three) times daily as needed for moderate pain or severe pain. 09/14/15   Sherlene Shams, MD  ipratropium-albuterol (DUONEB) 0.5-2.5 (3) MG/3ML SOLN Take 3 mLs by nebulization every 6 (six) hours. 03/29/15   Ramonita Lab, MD  levothyroxine (SYNTHROID, LEVOTHROID) 112 MCG tablet Take 1 tablet (112 mcg total) by mouth daily. 07/09/15   Shelia Media, MD  LORazepam (ATIVAN) 0.5 MG tablet Take 0.5 tablets (0.25 mg total) by mouth at bedtime. Patient not taking:  Reported on 10/19/2015 10/10/15   Sherlene Shams, MD  Morphine Sulfate (MORPHINE CONCENTRATE) 10 mg / 0.5 ml concentrated solution Take 0.13 mLs (2.6 mg total) by mouth every 2 (two) hours as needed for severe pain or shortness of breath. 10/10/15   Sherlene Shams, MD  polyethylene glycol (MIRALAX / GLYCOLAX) packet Take 17 g by mouth at bedtime.     Historical Provider, MD  potassium chloride (K-DUR) 10 MEQ tablet Take 1 tablet (10 mEq total) by mouth 2 (two) times daily. 08/28/15   Ellsworth Lennox, PA  traMADol (ULTRAM) 50 MG tablet Take 1 tablet (50 mg total) by mouth 3 (three) times daily as needed for moderate pain. Reported on 06/20/2015 08/17/15   Shelia Media, MD  Travoprost, BAK Free, (TRAVATAN) 0.004 % SOLN ophthalmic solution Place 1 drop into both eyes at bedtime.    Historical Provider, MD  Vitamin D, Ergocalciferol, (DRISDOL) 50000 UNITS CAPS capsule Take 50,000 Units by mouth every 7 (seven) days. Pt takes on Jun 25, 2022.    Historical Provider, MD  warfarin (COUMADIN) 5 MG tablet Take 5 mg by mouth daily at 6 PM.    Historical Provider, MD    Allergies Amoxicillin; Cetylpyridinium; Digoxin; Levaquin [levofloxacin]; and  Risedronate sodium  Family History  Problem Relation Age of Onset  . Cancer Mother     colon with mets  . Diabetes Mother   . Heart disease Father   . Hyperlipidemia Father   . Hypertension Father   . Heart attack Father   . Stroke Brother     brother who died Jun 25, 2022  . Heart attack Brother   . Cancer Paternal Aunt     breast  . Cancer Cousin     uterine, lung cancer    Social History Social History  Substance Use Topics  . Smoking status: Never Smoker  . Smokeless tobacco: Never Used  . Alcohol use No    Review of Systems  Constitutional: Negative for fever. + generalized weakness Eyes: Negative for visual changes. ENT: Negative for sore throat. Cardiovascular: Negative for chest pain. Respiratory: + shortness of breath. Gastrointestinal: Negative for abdominal pain, vomiting or diarrhea. Genitourinary: Negative for dysuria. Musculoskeletal: Negative for back pain. Skin: Negative for rash. Neurological: Negative for headaches, weakness or numbness.  ____________________________________________   PHYSICAL EXAM:  VITAL SIGNS: Vitals:   10/24/15 1001 10/24/15 1030  BP:  118/65  Pulse: (!) 39 (!) 40  Resp: 16 15  Temp:     Constitutional: Alert, confused, increased WOB. HEENT:      Head: Normocephalic and atraumatic.         Eyes: Conjunctivae are normal. Sclera is non-icteric. EOMI. PERRL      Mouth/Throat: Mucous membranes are dry.       Neck: Supple with no signs of meningismus. Cardiovascular: Bradycardic with regular rhythm. No murmurs, gallops, or rubs. 2+ symmetrical distal pulses are present in all extremities. No JVD. Respiratory: Increased work of breathing, severely diminished air movement on bilateral bases, no wheezing.  Gastrointestinal: Soft, non tender, and non distended with positive bowel sounds. No rebound or guarding. well healing surgical site  Musculoskeletal: Nontender with normal range of motion in all extremities. No edema, cyanosis,  or erythema of extremities. Neurologic: Face is symmetric. Moving all extremities. No gross focal neurologic deficits are appreciated. Skin: Skin is warm, dry and intact. No rash noted.   ____________________________________________   LABS (all labs ordered are listed, but only abnormal results are displayed)  Labs  Reviewed  CBC WITH DIFFERENTIAL/PLATELET - Abnormal; Notable for the following:       Result Value   RDW 16.1 (*)    Neutro Abs 8.2 (*)    Monocytes Absolute 1.3 (*)    All other components within normal limits  BRAIN NATRIURETIC PEPTIDE - Abnormal; Notable for the following:    B Natriuretic Peptide 1,549.0 (*)    All other components within normal limits  COMPREHENSIVE METABOLIC PANEL - Abnormal; Notable for the following:    Sodium 134 (*)    Potassium 5.6 (*)    CO2 17 (*)    Glucose, Bld 153 (*)    BUN 41 (*)    Creatinine, Ser 1.82 (*)    Calcium 8.4 (*)    GFR calc non Af Amer 23 (*)    GFR calc Af Amer 27 (*)    All other components within normal limits  LACTIC ACID, PLASMA - Abnormal; Notable for the following:    Lactic Acid, Venous 3.2 (*)    All other components within normal limits  TROPONIN I - Abnormal; Notable for the following:    Troponin I 0.03 (*)    All other components within normal limits  PROTIME-INR - Abnormal; Notable for the following:    Prothrombin Time 22.4 (*)    All other components within normal limits  BLOOD GAS, VENOUS - Abnormal; Notable for the following:    pH, Ven 7.23 (*)    Acid-base deficit 4.9 (*)    All other components within normal limits  URINE CULTURE  LIPASE, BLOOD  URINALYSIS COMPLETEWITH MICROSCOPIC ([facility] ONLY)   ____________________________________________  EKG  ED ECG REPORT I, Nita Sicklearolina Elvie Palomo, the attending physician, personally viewed and interpreted this ECG.  Atrial fibrillation with complete heart block, rate of 41, normal QTC, right axis deviation, no ST elevation or depression. Patient is  currently not being paced. ____________________________________________  RADIOLOGY  CXR: Central mild vascular congestion and mild infrahilar interstitial prominence suspicious for mild pulmonary edema. Bilateral small pleural effusion with bilateral basilar atelectasis or infiltrate right greater than left. ____________________________________________   PROCEDURES  Procedure(s) performed: None Procedures Critical Care performed: yes  CRITICAL CARE Performed by: Nita Sicklearolina Kyron Schlitt  ?  Total critical care time: 40 min  Critical care time was exclusive of separately billable procedures and treating other patients.  Critical care was necessary to treat or prevent imminent or life-threatening deterioration.  Critical care was time spent personally by me on the following activities: development of treatment plan with patient and/or surrogate as well as nursing, discussions with consultants, evaluation of patient's response to treatment, examination of patient, obtaining history from patient or surrogate, ordering and performing treatments and interventions, ordering and review of laboratory studies, ordering and review of radiographic studies, pulse oximetry and re-evaluation of patient's condition.  ____________________________________________   INITIAL IMPRESSION / ASSESSMENT AND PLAN / ED COURSE  82 y.o. female DNR/DNI, with a history of CHF, COPD, acalculous cholecystitis, atrial fibrillation, bradycardia and tachycardia dysrhythmia status post pacemaker who presents for evaluation of generalized weakness since having her perc chole tube removed on Friday. Patient with increased oxygen requirement, initially satting in the 80s on room air which improved to the upper 80s on a nonrebreather. Patient was then placed on BiPAP. I spoke with cardiology and patient's pacer is not working. Pacers battery is now exhausted and patient's battery was not replaced due to her poor health, chronic  comorbidities, risk of surgery, and patient's wishes. No interventions per cardiology  at this time. I spoke with patient's son did confirm patient's status of DNR/DNI however requested everything else be done for the patient. Patient's labs showing pH of 7.23 with PCO2 of 56, normal white count and hemoglobin, BNP elevated at 1549, chest x-ray concerning for pulmonary edema. Patient also with hyperkalemia for which she was given albuterol, bicarbonate, insulin, D50. Patient with aKI and lactate of 3.2. No fluid given at this time due to patient's respiratory status. She received 40 mg of IV Lasix. Patient was also given DuoNeb treatments, Solu-Medrol, and antibiotics for possible pneumonia. I discussed patient with the intensivist who recommended patient be admitted to stepdown as she is DNR/DNI. I discussed with Dr. Imogene Burn, hospitalist was accepted patient.  Clinical Course    Pertinent labs & imaging results that were available during my care of the patient were reviewed by me and considered in my medical decision making (see chart for details).    ____________________________________________   FINAL CLINICAL IMPRESSION(S) / ED DIAGNOSES  Final diagnoses:  Acute respiratory failure with hypoxia (HCC)  Acute on chronic congestive heart failure, unspecified congestive heart failure type (HCC)      NEW MEDICATIONS STARTED DURING THIS VISIT:  New Prescriptions   No medications on file     Note:  This document was prepared using Dragon voice recognition software and may include unintentional dictation errors.    Nita Sickle, MD 10/24/15 1050

## 2015-10-24 NOTE — ED Notes (Signed)
Attempted to call report to Ros[place]andy, [place]

## 2015-10-24 NOTE — ED Notes (Signed)
[facility] . Pt is on BIPAP. IV sites are WNL. Antibiotics are running.

## 2015-10-24 NOTE — H&P (Addendum)
[facility] - [place] at [facility]   PATIENT NAME: Laurie Larsen    MR#:  [id number]  DATE OF BIRTH:  March 15, 1923  DATE OF ADMISSION:  10/24/2015  PRIMARY CARE PHYSICIAN: Sherlene Shams, MD   REQUESTING/REFERRING PHYSICIAN: Nita Sickle, MD  CHIEF COMPLAINT:   Chief Complaint  Patient presents with  . Weakness   Weakness and shortness of breath HISTORY OF PRESENT ILLNESS:  Laurie Larsen  is a 82 y.o. female with a known history of CHF, COPD, acalculous cholecystitis, atrial fibrillation, bradycardia and tachycardia dysrhythmia status post pacemaker. She presented to the ED with worsening generalized weakness and shortness of breath several days. She had her perc chole tube removed on Friday and since then has had progressively worsening weakness. The patient is let's take on BiPAP due to respiratory failure, unable to provide information. She was found in respiratory failure, SAT was in the 80s on room air which improved to the upper 80s on a non-rebreather. She is put on BiPAP in the ED. BNP elevated at 1549, chest x-ray concerning for pulmonary edema. She was treated with Lasix 40 mg 1 dose. She also has bradycardia base heart rate 30-40's. Dr. Don Perking spoke with cardiology and patient's pacer is not working. Pacers battery is now exhausted and patient's battery was not replaced due to her poor health, chronic comorbidities, risk of surgery, and patient's wishes. No interventions per cardiology at this time. PAST MEDICAL HISTORY:   Past Medical History:  Diagnosis Date  . Acalculous cholecystitis   . Allergy   . Arthritis   . Back complaints   . Bronchitis   . CAD (coronary artery disease)    a. 2005 Cath: 100% dLCX-->Med managed.  . CHF (congestive heart failure) (HCC)   . Chronic diastolic CHF (congestive heart failure), NYHA class 1 (HCC)    a. 04/2013 Echo: EF 50-55%, triv MR, mildly dil LA, PASP , mild TR, mild-mod RA dil. b. echo 02/2015: EF  55-60%, mild MR, mild-mod TR, PA peak pressure 72 mm Hg  . Chronic pain    a. ? due to arthritis  . Chronic Right Pleural Effusion    a. 07/2012 s/p thoracentesis - Followed by Dr. Delford Field [facility] Pulmonology. b. 02/2015: s/p repeat R thoracentesis  . Colon polyps   . Colon polyps   . Diverticulitis large intestine   . Glaucoma   . Hay fever   . History of blood transfusion   . HTN (hypertension)   . Hyperlipidemia   . Hypothyroidism   . Permanent atrial fibrillation (HCC)    a. Chronic Coumadin.  [name] Phlebitis    a. after pacemeker placement in 2010.  . Pulmonary HTN (HCC)    a. PASP on echo 04/2013. b. PASP 72 mmHg on echo in 02/2015  . Rash/skin eruption    a. Multiple episodes - wide distribution-intermittent, possibly drug rash.  . Sick sinus syndrome (HCC)    a. 05/2008 s/p MDT EnRhythm DC PPM ([facility]red).  . Transfusion history    a. After childbirth 60 years ago (1957)  . Urine incontinence     PAST SURGICAL HISTORY:   Past Surgical History:  Procedure Laterality Date  . ABDOMINAL HYSTERECTOMY  1977  . ANKLE FRACTURE SURGERY  1979   Left, pin  . APPENDECTOMY  1939  . BREAST BIOPSY  2011  . BREAST FIBROADENOMA SURGERY  2010   Right  . BREAST SURGERY     Needle guided excision, right breast calcification  .  IR GENERIC HISTORICAL  10/19/2015   IR CHOLANGIOGRAM EXISTING TUBE 10/19/2015 Berdine Dance, MD [facility]-INTERV RAD  . KIDNEY SURGERY  1955   Right  . PACEMAKER INSERTION  06/20/08   MDT EnRhythm DR implanted by Dr Reyes Ivan  . SKIN GRAFT     left leg  . TONSILLECTOMY  82 yrs old    SOCIAL HISTORY:   Social History  Substance Use Topics  . Smoking status: Never Smoker  . Smokeless tobacco: Never Used  . Alcohol use No    FAMILY HISTORY:   Family History  Problem Relation Age of Onset  . Cancer Mother     colon with mets  . Diabetes Mother   . Heart disease Father   . Hyperlipidemia Father   . Hypertension Father   . Heart attack Father   .  Stroke Brother     brother who died 06-14-22  . Heart attack Brother   . Cancer Paternal Aunt     breast  . Cancer Cousin     uterine, lung cancer    DRUG ALLERGIES:   Allergies  Allergen Reactions  . Amoxicillin Other (See Comments)    Reaction:  Weakness Has patient had a PCN reaction causing immediate rash, facial/tongue/throat swelling, SOB or lightheadedness with hypotension: No Has patient had a PCN reaction causing severe rash involving mucus membranes or skin necrosis: No Has patient had a PCN reaction that required hospitalization No Has patient had a PCN reaction occurring within the last 10 years: No If all of the above answers are "NO", then may proceed with Cephalosporin use.  . Cetylpyridinium Other (See Comments)    Reaction:  Weakness   . Digoxin Other (See Comments)    Reaction:  Weakness and dehydration  . Levaquin [Levofloxacin] Itching  . Risedronate Sodium Rash    REVIEW OF SYSTEMS:   Review of Systems  Unable to perform ROS: Critical illness    MEDICATIONS AT HOME:   Prior to Admission medications   Medication Sig Start Date End Date Taking? Authorizing Provider  acetaminophen (TYLENOL) 650 MG CR tablet Take 1,300 mg by mouth at bedtime as needed for pain.    Historical Provider, MD  atenolol (TENORMIN) 25 MG tablet TAKE ONE TABLET TWICE A DAY 10/04/15   Peter M [place], MD  atorvastatin (LIPITOR) 20 MG tablet Take 1 tablet (20 mg total) by mouth at bedtime. 08/28/15   Ellsworth Lennox, PA  dexamethasone (DECADRON) 2 MG tablet Take 1 tablet (2 mg total) by mouth 2 (two) times daily with a meal. Last doe by 2 pm Patient not taking: Reported on 10/19/2015 10/10/15   Sherlene Shams, MD  diltiazem (CARDIZEM CD) 180 MG 24 hr capsule Take 180 mg by mouth daily.    Historical Provider, MD  docusate sodium (COLACE) 100 MG capsule Take 1 capsule (100 mg total) by mouth 2 (two) times daily. 03/29/15   Ramonita Lab, MD  dorzolamide-timolol (COSOPT) 22.3-6.8 MG/ML ophthalmic  solution Place 1 drop into both eyes 2 (two) times daily.     Historical Provider, MD  doxycycline (VIBRA-TABS) 100 MG tablet Take 1 tablet (100 mg total) by mouth 2 (two) times daily. Patient not taking: Reported on 10/19/2015 08/20/15   Shelia Media, MD  feeding supplement, ENSURE ENLIVE, (ENSURE ENLIVE) LIQD Take 237 mLs by mouth 2 (two) times daily with a meal. 03/29/15   Ramonita Lab, MD  furosemide (LASIX) 20 MG tablet Take 1 tablet (20 mg total) by mouth 2 (  two) times daily. 08/28/15   Ellsworth LennoxBrittany M Strader, PA  HYDROcodone-acetaminophen (NORCO) 10-325 MG tablet Take 1 tablet by mouth 3 (three) times daily as needed for moderate pain or severe pain. 09/14/15   Sherlene Shamseresa L Tullo, MD  ipratropium-albuterol (DUONEB) 0.5-2.5 (3) MG/3ML SOLN Take 3 mLs by nebulization every 6 (six) hours. 03/29/15   Ramonita LabAruna Gouru, MD  levothyroxine (SYNTHROID, LEVOTHROID) 112 MCG tablet Take 1 tablet (112 mcg total) by mouth daily. 07/09/15   Shelia MediaJennifer A Walker, MD  LORazepam (ATIVAN) 0.5 MG tablet Take 0.5 tablets (0.25 mg total) by mouth at bedtime. Patient not taking: Reported on 10/19/2015 10/10/15   Sherlene Shamseresa L Tullo, MD  Morphine Sulfate (MORPHINE CONCENTRATE) 10 mg / 0.5 ml concentrated solution Take 0.13 mLs (2.6 mg total) by mouth every 2 (two) hours as needed for severe pain or shortness of breath. 10/10/15   Sherlene Shamseresa L Tullo, MD  polyethylene glycol (MIRALAX / GLYCOLAX) packet Take 17 g by mouth at bedtime.     Historical Provider, MD  potassium chloride (K-DUR) 10 MEQ tablet Take 1 tablet (10 mEq total) by mouth 2 (two) times daily. 08/28/15   Ellsworth LennoxBrittany M Strader, PA  traMADol (ULTRAM) 50 MG tablet Take 1 tablet (50 mg total) by mouth 3 (three) times daily as needed for moderate pain. Reported on 06/20/2015 08/17/15   Shelia MediaJennifer A Walker, MD  Travoprost, BAK Free, (TRAVATAN) 0.004 % SOLN ophthalmic solution Place 1 drop into both eyes at bedtime.    Historical Provider, MD  Vitamin D, Ergocalciferol, (DRISDOL) 50000 UNITS CAPS  capsule Take 50,000 Units by mouth every 7 (seven) days. Pt takes on Saturday.    Historical Provider, MD  warfarin (COUMADIN) 5 MG tablet Take 5 mg by mouth daily at 6 PM.    Historical Provider, MD      VITAL SIGNS:  Blood pressure (!) 85/56, pulse (!) 58, temperature (!) 96.3 F (35.7 C), temperature source Axillary, resp. rate 20, height 5\' 4"  (1.626 m), weight 127 lb 13.9 oz (58 kg), SpO2 97 %.  PHYSICAL EXAMINATION:  Physical Exam  Constitutional: She appears distressed.  On BiPAP.  HENT:  Head: Normocephalic.  Eyes: Conjunctivae and EOM are normal. Pupils are equal, round, and reactive to light. No scleral icterus.  Neck: Normal range of motion. Neck supple. No JVD present. No tracheal deviation present. No thyromegaly present.  Cardiovascular: Exam reveals no gallop.   No murmur heard.  bradycardia.  Pulmonary/Chest: She is in respiratory distress. She has no wheezes. She has rales.  Abdominal: She exhibits no distension. There is no tenderness.  Musculoskeletal: She exhibits edema. She exhibits no tenderness.  Lymphadenopathy:    She has no cervical adenopathy.  Neurological: She is alert. No cranial nerve deficit.  Lethargic.  Skin: Skin is warm.    LABORATORY PANEL:   CBC  Recent Labs Lab 10/24/15 0909  WBC 10.9  HGB 14.3  HCT 42.9  PLT 221   ------------------------------------------------------------------------------------------------------------------  Chemistries   Recent Labs Lab 10/24/15 0909  NA 134*  K 5.6*  CL 104  CO2 17*  GLUCOSE 153*  BUN 41*  CREATININE 1.82*  CALCIUM 8.4*  AST 36  ALT 30  ALKPHOS 92  BILITOT 0.6   ------------------------------------------------------------------------------------------------------------------  Cardiac Enzymes  Recent Labs Lab 10/24/15 0909  TROPONINI 0.03*   ------------------------------------------------------------------------------------------------------------------  RADIOLOGY:    Dg Chest Portable 1 View  Result Date: 10/24/2015 CLINICAL DATA:  Shortness of Breath EXAM: PORTABLE CHEST 1 VIEW COMPARISON:  08/17/2015 FINDINGS: Cardiomegaly. Central  mild vascular congestion and mild infrahilar interstitial prominence suspicious for mild pulmonary edema. Bilateral small pleural effusion with bilateral basilar atelectasis or infiltrate right greater than left. Double lead cardiac pacemaker is unchanged in position. IMPRESSION: Central mild vascular congestion and mild infrahilar interstitial prominence suspicious for mild pulmonary edema. Bilateral small pleural effusion with bilateral basilar atelectasis or infiltrate right greater than left. Electronically Signed   By: Natasha Mead M.D.   On: 10/24/2015 09:45      IMPRESSION AND PLAN:   Acute respiratory failure with hypoxia and hypercapnia due to acute on chronic Diastolic CHF and bilateral pleural effusion. The patient is admitted to stepdown unit.  Continue BiPAP, DuoNeb around the clock, continue Lasix every 12 hours. Echocardiogram and cardiology consult.  Hyperkalemia. The patient is treated with cocktail of insulin, D50 and Kayexalate. Hold potassium supplement.  Acute renal failure. Follow-up BMP while on lasix. Lactic acidosis. Follow-up lactic acid level.  Severe bradycardia and pacemaker malfunction. Telemetry monitor.   Follow-up cardiologist.  Chronic A. fib. Continue Coumadin pharmacy to dose.  The patient has multiple comorbidities, high risk for cardiac and pulmonary arrest,  and  poor prognosis, I will request palliative care consult.  All the records are reviewed and case discussed with ED provider. Management plans discussed with the patient, Her husband, daughter-in-law, and they are in agreement.  CODE STATUS: DO NOT RESUSCITATE  TOTAL CRITICAL TIME TAKING CARE OF THIS PATIENT: 62 minutes.    Shaune Pollack M.D on 10/24/2015 at 12:46 PM  Between 7am to 6pm - Pager - [phone]  After 6pm go  to [email or web address] - Social research officer, government  [facility] [place] [facility]  Office  [phone]  CC: Primary care physician; Sherlene Shams, MD   Note: This dictation was prepared with Dragon dictation along with smaller phrase technology. Any transcriptional errors that result from this process are unintentional.

## 2015-10-24 NOTE — Progress Notes (Signed)
ANTICOAGULATION CONSULT NOTE - Initial Consult  Pharmacy Consult for warfarin dosing Indication: atrial fibrillation  Allergies  Allergen Reactions  . Amoxicillin Other (See Comments)    Reaction:  Weakness Has patient had a PCN reaction causing immediate rash, facial/tongue/throat swelling, SOB or lightheadedness with hypotension: No Has patient had a PCN reaction causing severe rash involving mucus membranes or skin necrosis: No Has patient had a PCN reaction that required hospitalization No Has patient had a PCN reaction occurring within the last 10 years: No If all of the above answers are "NO", then may proceed with Cephalosporin use.  . Cetylpyridinium Other (See Comments)    Reaction:  Weakness   . Digoxin Other (See Comments)    Reaction:  Weakness and dehydration  . Levaquin [Levofloxacin] Itching  . Risedronate Sodium Rash    Patient Measurements: Height: 5\' 4"  (162.6 cm) Weight: 127 lb 13.9 oz (58 kg) IBW/kg (Calculated) : 54.7   Vital Signs: Temp: 96.3 F (35.7 C) (09/27 0854) Temp Source: Axillary (09/27 0854) BP: 85/56 (09/27 1239) Pulse Rate: 58 (09/27 1239)  Labs:  Recent Labs  10/24/15 0909  HGB 14.3  HCT 42.9  PLT 221  LABPROT 22.4*  INR 1.94  CREATININE 1.82*  TROPONINI 0.03*    Estimated Creatinine Clearance: 17.7 mL/min (by C-G formula based on SCr of 1.82 mg/dL (H)).   Medical History: Past Medical History:  Diagnosis Date  . Acalculous cholecystitis   . Allergy   . Arthritis   . Back complaints   . Bronchitis   . CAD (coronary artery disease)    a. 2005 Cath: 100% dLCX-->Med managed.  . CHF (congestive heart failure) (HCC)   . Chronic diastolic CHF (congestive heart failure), NYHA class 1 (HCC)    a. 04/2013 Echo: EF 50-55%, triv MR, mildly dil LA, PASP 48mmHg, mild TR, mild-mod RA dil. b. echo 02/2015: EF 55-60%, mild MR, mild-mod TR, PA peak pressure 72 mm Hg  . Chronic pain    a. ? due to arthritis  . Chronic Right Pleural  Effusion    a. 07/2012 s/p thoracentesis - Followed by Dr. Delford [name]. b. 02/2015: s/p repeat R thoracentesis  . Colon polyps   . Colon polyps   . Diverticulitis large intestine   . Glaucoma   . Hay fever   . History of blood transfusion   . HTN (hypertension)   . Hyperlipidemia   . Hypothyroidism   . Permanent atrial fibrillation (HCC)    a. Chronic Coumadin.  [name]. Phlebitis    a. after pacemeker placement in 2010.  . Pulmonary HTN (HCC)    a. PASP 48mmHg on echo 04/2013. b. PASP 72 mmHg on echo in 02/2015  . Rash/skin eruption    a. Multiple episodes - wide distribution-intermittent, possibly drug rash.  . Sick sinus syndrome (HCC)    a. 05/2008 s/p MDT EnRhythm DC PPM ([facility]).  . Transfusion history    a. After childbirth 60 years ago (1957)  . Urine incontinence     Medications:  Scheduled:  . atorvastatin  20 mg Oral QHS  . docusate sodium  100 mg Oral BID  . dorzolamide  1 drop Both Eyes BID  . feeding supplement (ENSURE ENLIVE)  237 mL Oral BID WC  . furosemide  20 mg Intravenous BID  . ipratropium-albuterol  3 mL Nebulization Q6H  . levothyroxine  112 mcg Oral Daily  . polyethylene glycol  17 g Oral QHS  . sodium chloride flush  3 mL Intravenous Q12H    Assessment: Pt and nursing home reports pt is taking 5 mg daily of warfarin for afib (INR goal 2-3). Pt's pacemaker batteries have exhausted and they will not be replaced. Pt is currently receiving azithromycin 500 mg and ceftriaxone for possible sepsis.  Goal of Therapy:  INR 2-3 Monitor platelets by anticoagulation protocol: Yes   Plan: Continue home medication dose. Possible interaction with azithromycin, will continue to follow INR  Date  INR  Dose  9/27  1.94  5 mg

## 2015-10-24 NOTE — Consult Note (Addendum)
[facility] Critical Care Medicine Consultation     ASSESSMENT/PLAN   Addendum:  Spoke with son Jillyn Hidden via phone, relayed the degree of patient's illness and likelihood of further decline. Recommended that we focus on comfort and that we try some basic medication changes and other non-invasive meds  to see if we can improve her breathing and HR, but would otherwise not perform any invasive procedures or interventions and he was in agreement with this idea.  He is going to come to the hospital and discuss things with other siblings in regards to further planning. Will consult palliative care to help them work through this and make further planning.   PULMONARY A:Acute respiratory failure with large right pleural effusion, small left pleural effusion, bibasilar atelectasis. Possibly secondary to acute unspecified CHF from heart block.  Possible sepsis.  -Debility, deconditioning.  P:   Would not recommend thoracentesis at this point.  --Recommend change to comfort measures.  --Apparently the patient was followed by hospice for comfort, recommend discharge to hospice facility is survives.   CARDIOVASCULAR A: Heart block with acute unspecified congestive heart failure.  P:  Pt's pacemaker battery has died, and is not being replaced per pts wishes and poor chance of survival.   RENAL A:  Aki P:     GASTROINTESTINAL A:  Cholecystitis with abdominal pain, nausea.  P:   A/p percutaneous cholecystostomy tube--removed.    INFECTIOUS A: Possible sepsis.  P:   On abx.     ---------------------------------------  ---------------------------------------   Name: SHARLOTTE BAKA MRN: [id number] DOB: 1923-05-05    ADMISSION DATE:  10/24/2015 CONSULTATION DATE:  10/24/15  REFERRING MD :  Dr. Imogene Burn  CHIEF COMPLAINT: Dyspnea.    HISTORY OF PRESENT ILLNESS:   The patient is currently on bipap and can not provide history therefore all history was obtained from chart and from  staff.   SHALAINE PAYSON is a 82 y.o. female DNR/DNI, with a history of CHF, COPD, acalculous cholecystitis, atrial fibrillation, bradycardia and tachycardia dysrhythmia status post pacemaker who presents for evaluation of generalized weakness.  She was diagnosed with a calculus cholecystitis last year in September and chole tube was placed due to being a poor surgical candidate.Patient had her perc chole tube removed on Friday and since then has had progressively worsening weakness nausea.  Son reported the patient is noncompliant with her breathing treatments or using her oxygen at home.  In the ER she was found to be in respiratory distress, with desats while on NRB, therefore she was started on bipap. She was also noted to be in complete heart block, she has a history of Afib and sick sinus syndrome, s/p pacemaker. However pacers battery is now exhausted and patient's battery was not replaced due to her poor health, chronic comorbidities, risk of surgery, and patient's wishes.  I had seen the patient in my office in May of this year and had spoken with the patient's son, after she has been discharged from the hospital at that time we discussed referral to hospice in her current nursing home. At that time the patient stated that "I am ready to go to heaven".  Today she can not provide much in the way in history as she appears fairly tangential in her speech. I spoke with her son Jillyn Hidden and relayed the degree of her illness, on Bipap/Hi-flow oxygen, that she she has large effusion.     PAST MEDICAL HISTORY :  Past Medical History:  Diagnosis Date  .  Acalculous cholecystitis   . Allergy   . Arthritis   . Back complaints   . Bronchitis   . CAD (coronary artery disease)    a. 2005 Cath: 100% dLCX-->Med managed.  . CHF (congestive heart failure) (HCC)   . Chronic diastolic CHF (congestive heart failure), NYHA class 1 (HCC)    a. 04/2013 Echo: EF 50-55%, triv MR, mildly dil LA, PASP , mild  TR, mild-mod RA dil. b. echo 02/2015: EF 55-60%, mild MR, mild-mod TR, PA peak pressure 72 mm Hg  . Chronic pain    a. ? due to arthritis  . Chronic Right Pleural Effusion    a. 07/2012 s/p thoracentesis - Followed by Dr. Delford Field [facility]monology. b. 02/2015: s/p repeat R thoracentesis  . Colon polyps   . Colon polyps   . Diverticulitis large intestine   . Glaucoma   . Hay fever   . History of blood transfusion   . HTN (hypertension)   . Hyperlipidemia   . Hypothyroidism   . Permanent atrial fibrillation (HCC)    a. Chronic Coumadin.  [name] Phlebitis    a. after pacemeker placement in 2010.  . Pulmonary HTN (HCC)    a. PASP on echo 04/2013. b. PASP 72 mmHg on echo in 02/2015  . Rash/skin eruption    a. Multiple episodes - wide distribution-intermittent, possibly drug rash.  . Sick sinus syndrome (HCC)    a. 05/2008 s/p MDT EnRhythm DC PPM ([facility]).  . Transfusion history    a. After childbirth 60 years ago (1957)  . Urine incontinence    Past Surgical History:  Procedure Laterality Date  . ABDOMINAL HYSTERECTOMY  1977  . ANKLE FRACTURE SURGERY  1979   Left, pin  . APPENDECTOMY  1939  . BREAST BIOPSY  2011  . BREAST FIBROADENOMA SURGERY  2010   Right  . BREAST SURGERY     Needle guided excision, right breast calcification  . IR GENERIC HISTORICAL  10/19/2015   IR CHOLANGIOGRAM EXISTING TUBE 10/19/2015 Berdine Dance, MD [facility]-INTERV RAD  . KIDNEY SURGERY  1955   Right  . PACEMAKER INSERTION  06/20/08   MDT EnRhythm DR implanted by Dr Reyes Ivan  . SKIN GRAFT     left leg  . TONSILLECTOMY  82 yrs old   Prior to Admission medications   Medication Sig Start Date End Date Taking? Authorizing Provider  acetaminophen (TYLENOL) 650 MG CR tablet Take 1,300 mg by mouth at bedtime as needed for pain.    Historical Provider, MD  atenolol (TENORMIN) 25 MG tablet TAKE ONE TABLET TWICE A DAY 10/04/15   Peter M [place], MD  atorvastatin (LIPITOR) 20 MG tablet Take 1 tablet (20 mg total)  by mouth at bedtime. 08/28/15   Ellsworth Lennox, PA  dexamethasone (DECADRON) 2 MG tablet Take 1 tablet (2 mg total) by mouth 2 (two) times daily with a meal. Last doe by 2 pm Patient not taking: Reported on 10/19/2015 10/10/15   Sherlene Shams, MD  diltiazem (CARDIZEM CD) 180 MG 24 hr capsule Take 180 mg by mouth daily.    Historical Provider, MD  docusate sodium (COLACE) 100 MG capsule Take 1 capsule (100 mg total) by mouth 2 (two) times daily. 03/29/15   Ramonita Lab, MD  dorzolamide-timolol (COSOPT) 22.3-6.8 MG/ML ophthalmic solution Place 1 drop into both eyes 2 (two) times daily.     Historical Provider, MD  doxycycline (VIBRA-TABS) 100 MG tablet Take 1 tablet (100 mg total) by  mouth 2 (two) times daily. Patient not taking: Reported on 10/19/2015 08/20/15   Shelia Media, MD  feeding supplement, ENSURE ENLIVE, (ENSURE ENLIVE) LIQD Take 237 mLs by mouth 2 (two) times daily with a meal. 03/29/15   Ramonita Lab, MD  furosemide (LASIX) 20 MG tablet Take 1 tablet (20 mg total) by mouth 2 (two) times daily. 08/28/15   Ellsworth Lennox, PA  HYDROcodone-acetaminophen (NORCO) 10-325 MG tablet Take 1 tablet by mouth 3 (three) times daily as needed for moderate pain or severe pain. 09/14/15   Sherlene Shams, MD  ipratropium-albuterol (DUONEB) 0.5-2.5 (3) MG/3ML SOLN Take 3 mLs by nebulization every 6 (six) hours. 03/29/15   Ramonita Lab, MD  levothyroxine (SYNTHROID, LEVOTHROID) 112 MCG tablet Take 1 tablet (112 mcg total) by mouth daily. 07/09/15   Shelia Media, MD  LORazepam (ATIVAN) 0.5 MG tablet Take 0.5 tablets (0.25 mg total) by mouth at bedtime. Patient not taking: Reported on 10/19/2015 10/10/15   Sherlene Shams, MD  Morphine Sulfate (MORPHINE CONCENTRATE) 10 mg / 0.5 ml concentrated solution Take 0.13 mLs (2.6 mg total) by mouth every 2 (two) hours as needed for severe pain or shortness of breath. 10/10/15   Sherlene Shams, MD  polyethylene glycol (MIRALAX / GLYCOLAX) packet Take 17 g by mouth at  bedtime.     Historical Provider, MD  potassium chloride (K-DUR) 10 MEQ tablet Take 1 tablet (10 mEq total) by mouth 2 (two) times daily. 08/28/15   Ellsworth Lennox, PA  traMADol (ULTRAM) 50 MG tablet Take 1 tablet (50 mg total) by mouth 3 (three) times daily as needed for moderate pain. Reported on 06/20/2015 08/17/15   Shelia Media, MD  Travoprost, BAK Free, (TRAVATAN) 0.004 % SOLN ophthalmic solution Place 1 drop into both eyes at bedtime.    Historical Provider, MD  Vitamin D, Ergocalciferol, (DRISDOL) 50000 UNITS CAPS capsule Take 50,000 Units by mouth every 7 (seven) days. Pt takes on 06-25-2022.    Historical Provider, MD  warfarin (COUMADIN) 5 MG tablet Take 5 mg by mouth daily at 6 PM.    Historical Provider, MD   Allergies  Allergen Reactions  . Amoxicillin Other (See Comments)    Reaction:  Weakness Has patient had a PCN reaction causing immediate rash, facial/tongue/throat swelling, SOB or lightheadedness with hypotension: No Has patient had a PCN reaction causing severe rash involving mucus membranes or skin necrosis: No Has patient had a PCN reaction that required hospitalization No Has patient had a PCN reaction occurring within the last 10 years: No If all of the above answers are "NO", then may proceed with Cephalosporin use.  . Cetylpyridinium Other (See Comments)    Reaction:  Weakness   . Digoxin Other (See Comments)    Reaction:  Weakness and dehydration  . Levaquin [Levofloxacin] Itching  . Risedronate Sodium Rash    FAMILY HISTORY:  Family History  Problem Relation Age of Onset  . Cancer Mother     colon with mets  . Diabetes Mother   . Heart disease Father   . Hyperlipidemia Father   . Hypertension Father   . Heart attack Father   . Stroke Brother     brother who died 25-Jun-2022  . Heart attack Brother   . Cancer Paternal Aunt     breast  . Cancer Cousin     uterine, lung cancer   SOCIAL HISTORY:  reports that she has never smoked. She has never used  smokeless  tobacco. She reports that she does not drink alcohol or use drugs.  REVIEW OF SYSTEMS:   Could not provide ROS due to critical illness.    VITAL SIGNS: Temp:  [96.3 F (35.7 C)] 96.3 F (35.7 C) (09/27 0854) Pulse Rate:  [39-58] 58 (09/27 1239) Resp:  [14-20] 20 (09/27 1239) BP: (85-139)/(56-66) 85/56 (09/27 1239) SpO2:  [92 %-100 %] 97 % (09/27 1239) FiO2 (%):  [100 %] 100 % (09/27 1105) Weight:  [45.4 kg (100 lb 2 oz)-58 kg (127 lb 13.9 oz)] 58 kg (127 lb 13.9 oz) (09/27 1239) HEMODYNAMICS:   VENTILATOR SETTINGS: FiO2 (%):  [100 %] 100 % INTAKE / OUTPUT:  Intake/Output Summary (Last 24 hours) at 10/24/15 1244 Last data filed at 10/24/15 1236  Gross per 24 hour  Intake              300 ml  Output                0 ml  Net              300 ml    Physical Examination:   VS: BP (!) 85/56   Pulse (!) 58   Temp (!) 96.3 F (35.7 C) (Axillary)   Resp 20   Ht 5\' 4"  (1.626 m)   Wt 58 kg (127 lb 13.9 oz)   SpO2 97%   BMI 21.95 kg/m   General Appearance: No distress  Neuro:without focal findings, mental status, speech normal,. HEENT: PERRLA, EOM intact, no ptosis. Pulmonary: normal breath sounds., diaphragmatic excursion normal. CardiovascularNormal S1,S2.  No m/r/g.    Abdomen: Benign, Soft, non-tender, No masses, hepatosplenomegaly, No lymphadenopathy Renal:  No costovertebral tenderness  GU:  Not performed at this time. Endoc: No evident thyromegaly, no signs of acromegaly. Skin:   warm, no rashes, no ecchymosis  Extremities: normal, no cyanosis, clubbing, no edema, warm with normal capillary refill.    LABS: Reviewed   LABORATORY PANEL:   CBC  Recent Labs Lab 10/24/15 0909  WBC 10.9  HGB 14.3  HCT 42.9  PLT 221    Chemistries   Recent Labs Lab 10/24/15 0909  NA 134*  K 5.6*  CL 104  CO2 17*  GLUCOSE 153*  BUN 41*  CREATININE 1.82*  CALCIUM 8.4*  AST 36  ALT 30  ALKPHOS 92  BILITOT 0.6    No results for input(s): GLUCAP in  the last 168 hours. No results for input(s): PHART, PCO2ART, PO2ART in the last 168 hours.  Recent Labs Lab 10/24/15 0909  AST 36  ALT 30  ALKPHOS 92  BILITOT 0.6  ALBUMIN 3.6    Cardiac Enzymes  Recent Labs Lab 10/24/15 0909  TROPONINI 0.03*    RADIOLOGY:  Dg Chest Portable 1 View  Result Date: 10/24/2015 CLINICAL DATA:  Shortness of Breath EXAM: PORTABLE CHEST 1 VIEW COMPARISON:  08/17/2015 FINDINGS: Cardiomegaly. Central mild vascular congestion and mild infrahilar interstitial prominence suspicious for mild pulmonary edema. Bilateral small pleural effusion with bilateral basilar atelectasis or infiltrate right greater than left. Double lead cardiac pacemaker is unchanged in position. IMPRESSION: Central mild vascular congestion and mild infrahilar interstitial prominence suspicious for mild pulmonary edema. Bilateral small pleural effusion with bilateral basilar atelectasis or infiltrate right greater than left. Electronically Signed   By: Natasha MeadLiviu  Pop M.D.   On: 10/24/2015 09:45       --Wells Guileseep Ariadna Setter, MD.  Board Certified in Internal Medicine, Pulmonary Medicine, Critical Care Medicine, and Sleep Medicine.  ICU  Pager [phone]  Pulmonary and Critical Care Office Number: [phone]  Santiago Glad, M.D.  Stephanie Acre, M.D.  Billy Fischer, M.D   10/24/2015, 12:44 PM   Critical Care Attestation.  I have personally obtained a history, examined the patient, evaluated laboratory and imaging results, formulated the assessment and plan and placed orders. The Patient requires high complexity decision making for assessment and support, frequent evaluation and titration of therapies, application of advanced monitoring technologies and extensive interpretation of multiple databases. The patient has critical illness that could lead imminently to failure of 1 or more organ systems and requires the highest level of physician preparedness to intervene.  Critical Care Time  devoted to patient care services described in this note is 70 minutes and is exclusive of time spent in procedures.

## 2015-10-24 NOTE — Progress Notes (Signed)
RT to ER 19 to transport patient to ICU 17 on V60 Bipap.  Patient transported without incident, remaining on Bipap entire time.  Patient left on Bipap, tolerating well at this time.  Will continue to monitor.

## 2015-10-24 NOTE — Progress Notes (Addendum)
Visit made. Patient is currently followed [facility] with a hospice diagnosis of COPD. She is a DNR code with out of facility DNR in place. She is oxygen dependent, 3 liters via nasal cannula and has oxygen in place at her home. Patient was sent to the ED this morning for evaluation of weakness, she was found to have oxygen saturations in the 80's and required Bipap. CXR showed mild pulmonary edema and small bilateral pleural effusions. Her labs revealed elevated potassium at 5.6.  She has received lasix and IV antibiotics. She is currently on [place] oxygen. Patient seen sitting up in bed, feeding herself dinner, husband Lorella NimrodHarvey and daughter in Investment banker, [place]ilyn at bedside. Patient alert and oriented, [place] oxygen in place. Heart rate low during visit 35-40, oxygen saturations 89-90%. [facility] patient['s Pace maker is no longer viable and there are no plans to replace it. Mrs. Katrinka BlazingSmith  denied pain, or dyspnea at time of visit. Emotional support given. Hospital care team made aware patient is followed by hospice. Updated notes faxed to triage, hospice team updated. Will continue to follow through final disposition. Thank you. Dayna BarkerKaren Robertson RN, BSN, [facility], hospital liaison [phone] c

## 2015-10-24 NOTE — ED Triage Notes (Signed)
Pt arrived by EMS from [facility]. Pt reports having gallbladder drained last week. Pt reports intermittent dull pain in that area.  Pt states she has been feeling weak and unable to ambulate since last night. Normally uses a walker to get around. Pt alert and oriented at this time. EMS reports O2 sats in 80's. Pt normally wears O2 3L continuously

## 2015-10-24 NOTE — Progress Notes (Addendum)
Patient and family updated on plan of care. No complaints at this time. Patient off bipap on HFNC. Laurie Larsen

## 2015-10-24 NOTE — ED Notes (Signed)
Attempted to call report to OglesbyBrandy, Charity fundraiserN . Nurse is to call back in 5 minutes.

## 2015-10-24 NOTE — Consult Note (Signed)
Cardiology Consultation Note  Patient ID: Laurie Larsen, MRN: [id number], DOB/AGE: 05-15-1923 82 y.o. Admit date: 10/24/2015   Date of Consult: 10/24/2015 Primary Physician: Sherlene Shams, MD Primary Cardiologist: Dr. Swaziland, MD Requesting Physician: Dr. Imogene Burn, MD  Chief Complaint: SOB/fatigue Reason for Consult: Bradycardia on Timolol and in the setting of hyperkalemia with metabolic acidosis/acute on chronic diastolic CHF  HPI: 82 y.o. female with h/o CAD (known 100% dLCx occlusion by cath in 2005, medically managed), COPD (on 2L- 3L [place]) chronic diastolic CHF (EF 47-42% by echo in 02/2015), chronic R pleural effusion, chronic atrial fibrillation (on Coumadin), pulm HTN, SSS (s/p PPM placement in 2010), and chronic pain secondary to scoliosis who presents to the office today for follow-up of her CHF.  Hospital admission was at [facility] in 02/2015 for acute hypoxic respiratory failure secondary to Influenza and Klebsiella PNA. She underwent thoracentesis for R pleural effusion at that time. Echo at that time showed an EF of 55-60%, no RWMA, no technically sufficient to allow for LV diastolic function, mild MR, LA mildly dilated, RV systolic function mildly reduced, PASP 72 mmHg. Hospitalized again in 05/2015 for CAP and acute on chronic diastolic CHF. Seen by Dr. Swaziland in 06/2015 and her weight was 104 lbs at that time.   Seen most recently in the office on 08/28/15 and was doing well at that time. Unfortunately, she was noted to have had a recurrence of PNA at that time and was on doxycycline. Weight had been stable and she had not required extra O2 usage. She has been under Hospice care for assistance with her pain management.   She presented to [facility] on 9/27 with complaints of increased SOB and fatigue since the weekend prior. She just recently had a percutaneous chole tube removed for acalculous cholecystitis in 09/214 and this seemed to have taken a lot out of her for uncertain reasons. She has  been noncompliant with her breathing treatments as well as her home oxygen. Weight has been stable and she has been without increased orthopnea.    Upon the patient's arrival to [facility] they were found to have an oxygen saturation in the 80's on room air requiring BiPAP. VBG with pH 7.23, BNP 1549 (slightly above her baseline), K+ 5.6, SCr 1.82 (baseline 0.7-0.8), BUN 41, troponin 0.03, hgb 14.3, wbc 10.9, lipase 35. Heart rate was noted to be in the 30's to 40's bpm in Afib. Of note she has been on Cardizem CD, atenolol, and Timolol at home. ECG not on file for review in Epic or in paper chart, CXR showed central mild vascular congestion suspicious for mild pulmonary edema. Also with bilateral infiltrate R>L. She received IV Lasix 40 mg in the ED and this was continued by IM. Currently, still on BiPAP 2/2 hypoxia.    Past Medical History:  Diagnosis Date  . Acalculous cholecystitis   . Allergy   . Arthritis   . Back complaints   . Bronchitis   . CAD (coronary artery disease)    a. 2005 Cath: 100% dLCX-->Med managed.  . CHF (congestive heart failure) (HCC)   . Chronic diastolic CHF (congestive heart failure), NYHA class 1 (HCC)    a. 04/2013 Echo: EF 50-55%, triv MR, mildly dil LA, PASP , mild TR, mild-mod RA dil. b. echo 02/2015: EF 55-60%, mild MR, mild-mod TR, PA peak pressure 72 mm Hg  . Chronic pain    a. ? due to arthritis  . Chronic Right Pleural Effusion  a. 07/2012 s/p thoracentesis - Followed by Dr. Delford [facility]Wright [facility]. b. 02/2015: s/p repeat R thoracentesis  . Colon polyps   . Colon polyps   . Diverticulitis large intestine   . Glaucoma   . Hay fever   . History of blood transfusion   . HTN (hypertension)   . Hyperlipidemia   . Hypothyroidism   . Permanent atrial fibrillation (HCC)    a. Chronic Coumadin.  [name]. Phlebitis    a. after pacemeker placement in 2010.  . Pulmonary HTN (HCC)    a. PASP 48mmHg on echo 04/2013. b. PASP 72 mmHg on echo in 02/2015  .  Rash/skin eruption    a. Multiple episodes - wide distribution-intermittent, possibly drug rash.  . Sick sinus syndrome (HCC)    a. 05/2008 s/p MDT EnRhythm DC PPM ([place]).  . Transfusion history    a. After childbirth 60 years ago (1957)  . Urine incontinence       Most Recent Cardiac Studies: As above   Surgical History:  Past Surgical History:  Procedure Laterality Date  . ABDOMINAL HYSTERECTOMY  1977  . ANKLE FRACTURE SURGERY  1979   Left, pin  . APPENDECTOMY  1939  . BREAST BIOPSY  2011  . BREAST FIBROADENOMA SURGERY  2010   Right  . BREAST SURGERY     Needle guided excision, right breast calcification  . IR GENERIC HISTORICAL  10/19/2015   IR CHOLANGIOGRAM EXISTING TUBE 10/19/2015 Berdine DanceMichael Shick, MD [facility]-INTERV RAD  . KIDNEY SURGERY  1955   Right  . PACEMAKER INSERTION  06/20/08   MDT EnRhythm DR implanted by Dr Reyes IvanKersey  . SKIN GRAFT     left leg  . TONSILLECTOMY  82 yrs old     Home Meds: Prior to Admission medications   Medication Sig Start Date End Date Taking? Authorizing Provider  acetaminophen (TYLENOL) 650 MG CR tablet Take 1,300 mg by mouth at bedtime as needed for pain.    Historical Provider, MD  atenolol (TENORMIN) 25 MG tablet TAKE ONE TABLET TWICE A DAY 10/04/15   Peter M SwazilandJordan, MD  atorvastatin (LIPITOR) 20 MG tablet Take 1 tablet (20 mg total) by mouth at bedtime. 08/28/15   Ellsworth LennoxBrittany M Strader, PA  dexamethasone (DECADRON) 2 MG tablet Take 1 tablet (2 mg total) by mouth 2 (two) times daily with a meal. Last doe by 2 pm Patient not taking: Reported on 10/19/2015 10/10/15   Sherlene Shamseresa L Tullo, MD  diltiazem (CARDIZEM CD) 180 MG 24 hr capsule Take 180 mg by mouth daily.    Historical Provider, MD  docusate sodium (COLACE) 100 MG capsule Take 1 capsule (100 mg total) by mouth 2 (two) times daily. 03/29/15   Ramonita LabAruna Gouru, MD  dorzolamide-timolol (COSOPT) 22.3-6.8 MG/ML ophthalmic solution Place 1 drop into both eyes 2 (two) times daily.     Historical Provider, MD    doxycycline (VIBRA-TABS) 100 MG tablet Take 1 tablet (100 mg total) by mouth 2 (two) times daily. Patient not taking: Reported on 10/19/2015 08/20/15   Shelia MediaJennifer A Walker, MD  feeding supplement, ENSURE ENLIVE, (ENSURE ENLIVE) LIQD Take 237 mLs by mouth 2 (two) times daily with a meal. 03/29/15   Ramonita LabAruna Gouru, MD  furosemide (LASIX) 20 MG tablet Take 1 tablet (20 mg total) by mouth 2 (two) times daily. 08/28/15   Ellsworth LennoxBrittany M Strader, PA  HYDROcodone-acetaminophen (NORCO) 10-325 MG tablet Take 1 tablet by mouth 3 (three) times daily as needed for moderate pain or severe pain.  09/14/15   Sherlene Shams, MD  ipratropium-albuterol (DUONEB) 0.5-2.5 (3) MG/3ML SOLN Take 3 mLs by nebulization every 6 (six) hours. 03/29/15   Ramonita Lab, MD  levothyroxine (SYNTHROID, LEVOTHROID) 112 MCG tablet Take 1 tablet (112 mcg total) by mouth daily. 07/09/15   Shelia Media, MD  LORazepam (ATIVAN) 0.5 MG tablet Take 0.5 tablets (0.25 mg total) by mouth at bedtime. Patient not taking: Reported on 10/19/2015 10/10/15   Sherlene Shams, MD  Morphine Sulfate (MORPHINE CONCENTRATE) 10 mg / 0.5 ml concentrated solution Take 0.13 mLs (2.6 mg total) by mouth every 2 (two) hours as needed for severe pain or shortness of breath. 10/10/15   Sherlene Shams, MD  polyethylene glycol (MIRALAX / GLYCOLAX) packet Take 17 g by mouth at bedtime.     Historical Provider, MD  potassium chloride (K-DUR) 10 MEQ tablet Take 1 tablet (10 mEq total) by mouth 2 (two) times daily. 08/28/15   Ellsworth Lennox, PA  traMADol (ULTRAM) 50 MG tablet Take 1 tablet (50 mg total) by mouth 3 (three) times daily as needed for moderate pain. Reported on 06/20/2015 08/17/15   Shelia Media, MD  Travoprost, BAK Free, (TRAVATAN) 0.004 % SOLN ophthalmic solution Place 1 drop into both eyes at bedtime.    Historical Provider, MD  Vitamin D, Ergocalciferol, (DRISDOL) 50000 UNITS CAPS capsule Take 50,000 Units by mouth every 7 (seven) days. Pt takes on Saturday.    Historical  Provider, MD  warfarin (COUMADIN) 5 MG tablet Take 5 mg by mouth daily at 6 PM.    Historical Provider, MD    Inpatient Medications:  . atorvastatin  20 mg Oral QHS  . docusate sodium  100 mg Oral BID  . dorzolamide  1 drop Both Eyes BID  . feeding supplement (ENSURE ENLIVE)  237 mL Oral BID WC  . furosemide  20 mg Intravenous BID  . ipratropium-albuterol  3 mL Nebulization Q6H  . levothyroxine  112 mcg Oral Daily  . polyethylene glycol  17 g Oral QHS  . sodium chloride flush  3 mL Intravenous Q12H      Allergies:  Allergies  Allergen Reactions  . Amoxicillin Other (See Comments)    Reaction:  Weakness Has patient had a PCN reaction causing immediate rash, facial/tongue/throat swelling, SOB or lightheadedness with hypotension: No Has patient had a PCN reaction causing severe rash involving mucus membranes or skin necrosis: No Has patient had a PCN reaction that required hospitalization No Has patient had a PCN reaction occurring within the last 10 years: No If all of the above answers are "NO", then may proceed with Cephalosporin use.  . Cetylpyridinium Other (See Comments)    Reaction:  Weakness   . Digoxin Other (See Comments)    Reaction:  Weakness and dehydration  . Levaquin [Levofloxacin] Itching  . Risedronate Sodium Rash    Social History   Social History  . Marital status: Married    Spouse name: Lorella Nimrod  . Number of children: 3  . Years of education: 5   Occupational History  . RETIRED Retired   Social History Main Topics  . Smoking status: Never Smoker  . Smokeless tobacco: Never Used  . Alcohol use No  . Drug use: No  . Sexual activity: No   Other Topics Concern  . Not on file   Social History Narrative   Lives with husband in [place]. Three sons.      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     Married 1948.    3 sons - 2050/06/06, 06-06-2055, 06/05/56; 5 grandchildren-one deceased -   OD @ 39.        End of Life Care: no prolonged heroic measures, i.e. Artificial feeding or hydration; DNR; no prolonged intubation.     Family History  Problem Relation Age of Onset  . Cancer Mother     colon with mets  . Diabetes Mother   . Heart disease Father   . Hyperlipidemia Father   . Hypertension Father   . Heart attack Father   . Stroke Brother     brother who died 06-08-2022  . Heart attack Brother   . Cancer Paternal Aunt     breast  . Cancer Cousin     uterine, lung cancer     Review of Systems:  Review of Systems  Unable to perform ROS: Medical condition  Currently wearing BiPAP  Labs:  Recent Labs  10/24/15 0909  TROPONINI 0.03*   Lab Results  Component Value Date   WBC 10.9 10/24/2015   HGB 14.3 10/24/2015   HCT 42.9 10/24/2015   MCV 94.3 10/24/2015   PLT 221 10/24/2015     Recent Labs Lab 10/24/15 0909  NA 134*  K 5.6*  CL 104  CO2 17*  BUN 41*  CREATININE 1.82*  CALCIUM 8.4*  PROT 6.7  BILITOT 0.6  ALKPHOS 92  ALT 30  AST 36  GLUCOSE 153*   Lab Results  Component Value Date   CHOL 117 06/01/2014   HDL 43 06/01/2014   LDLCALC 60 06/01/2014   TRIG 68 06/01/2014   No results found for: DDIMER  Radiology/Studies:  Dg Chest Portable 1 View  Result Date: 10/24/2015 CLINICAL DATA:  Shortness of Breath EXAM: PORTABLE CHEST 1 VIEW COMPARISON:  08/17/2015 FINDINGS: Cardiomegaly. Central mild vascular congestion and mild infrahilar interstitial prominence suspicious for mild pulmonary edema. Bilateral small pleural effusion with bilateral basilar atelectasis or infiltrate right greater than left. Double lead cardiac pacemaker is unchanged in position. IMPRESSION: Central mild vascular congestion and mild infrahilar interstitial prominence suspicious for mild pulmonary edema. Bilateral small pleural effusion with bilateral basilar atelectasis or infiltrate right greater than left. Electronically Signed   By: Natasha Mead M.D.   On: 10/24/2015 09:45    Ir Cholangiogram Existing Tube  Result Date: 10/19/2015 INDICATION: Chronic cholecystitis, chronic indwelling cholecystostomy which has been capped for several months and well tolerated. EXAM: CHOLANGIOGRAM VIA EXISTING CATHETER MEDICATIONS: None. ANESTHESIA/SEDATION: None. FLUOROSCOPY TIME:  Fluoroscopy Time: 0 minutes 36 seconds (5 mGy). COMPLICATIONS: None immediate. PROCEDURE: Informed written consent was obtained from the patient after a thorough discussion of the procedural risks, benefits and alternatives. All questions were addressed. Maximal Sterile Barrier Technique was utilized including caps, mask, sterile gowns, sterile gloves, sterile drape, hand hygiene and skin antiseptic. A timeout was performed prior to the initiation of the procedure. Under sterile conditions, the existing cholecystostomy was injected with contrast for cholangiogram. This demonstrates a collapsed gallbladder. Cystic duct, common hepatic duct, common bile duct are patent. Contrast easily drains into the duodenum. Gallbladder was decompressed by syringe aspiration. Cholecystostomy was cut and removed without difficulty. Sterile dressing applied. No immediate complication. Findings discussed with the patient and her daughter. IMPRESSION: Patent biliary system. Successful cholecystostomy removal. Electronically Signed   By: Judie Petit.  Shick M.D.   On: 10/19/2015 14:01    EKG: Not on file  in Epic or in the paper chart for review  Telemetry: Interpreted by me showed: Afib with bradycardic rates in the upper 30's to 40's bpm  Weights: Filed Weights   10/24/15 0913 10/24/15 1239  Weight: 45.4 kg (100 lb 2 oz) 58 kg (127 lb 13.9 oz)     Physical Exam: Blood pressure (!) 85/56, pulse (!) 58, temperature (!) 96.3 F (35.7 C), temperature source Axillary, resp. rate 20, height 5\' 4"  (1.626 m), weight 58 kg (127 lb 13.9 oz), SpO2 97 %. Body mass index is 21.95 kg/m. General: Critically ill appearing, in no acute  distress. Head: Normocephalic, atraumatic, sclera non-icteric, no xanthomas, nares are without discharge.  Neck: Negative for carotid bruits. JVD not elevated. Lungs: Decreased breath sounds bilaterally. Breathing is unlabored. Heart: Bradycardic, irregularly-irregular with S1 S2. No murmurs, rubs, or gallops appreciated. Abdomen: Soft, non-tender, non-distended with normoactive bowel sounds. No hepatomegaly. No rebound/guarding. No obvious abdominal masses. Msk:  Strength and tone appear normal for age. Extremities: No clubbing or cyanosis. No edema. Distal pedal pulses are 2+ and equal bilaterally. Neuro: Alert and oriented X 3. No facial asymmetry. No focal deficit. Moves all extremities spontaneously. Psych:  Responds to questions appropriately with a normal affect.    Assessment and Plan:  Principal Problem:   Acute respiratory failure with hypoxia (HCC) Active Problems:   Acute on chronic diastolic CHF (congestive heart failure) (HCC)   DNR (do not resuscitate)   Septic shock (HCC)   Hyperkalemia   Medication adverse effect   Drug-induced bradycardia   Acute renal failure (HCC)   ATRIAL FIBRILLATION, CHRONIC   SSS (sick sinus syndrome) (HCC)   Chronic anticoagulation   Pacer at end of battery life    1. Acute on chronic respiratory failure with hypoxia requiring BiPAP: -Likely multifactorial including acute on chronic diastolic CHF and possible PNA -Wean oxygen as able -She has been a Hospice patient most recently, would limit any invasive procedure -PCCM without plans for thoracentesis at this time  -EKG is documented in ED physician note, however there is no order for EKG in Epic, nor is there an EKG in the paper chart for review. Perhaps this is being scanned in. Nonetheless, I am unable to review this study -Will order EKG 2. Chronic Afib with bradycardia: -Her PPM has been ERI and is now not functional -There have been no plans to replace her generator and this  continues to be the case given her advanced age and comorbidities  -She has been on Cardizem CD, atenolol, and Timolol as an outpatient, all of which can lead to bradycardia. Would hold these medications. Defer further management of her eyes to IM -She was also noted to be in acute renal failure with an elevated potassium which also can lead to bradycardia. Because of this we have held her lisinopril in an effort to decrease her potassium -Should her potassium continue to trend upwards she may need further intervention to decrease this -Continue Coumadin per pharmacy -CHADS2VASc at least 6 (CHF, HTN, age x 2, vascular disease, female)  3. Acute on chronic diastolic CHF/pulmonary HTN/right heart failure: -Her labs actually show acute renal failure with signs of dehydration  -CXR does show vascular congestion and her BNP is elevated -Gentle diuresis with IV Lasix -Not on beta blocker at this time given bradycardia  4. Hyperkalemia: -As above -Suggest recheck this evening  5. Possible PNA with septic shock with SBP in the 80's mmHg: -There is concern she is not adequately perfusing  at this time given her hypotension with marked bradycardia  -If patient and family want continued aggressive support consider pressor support -On azithromycin per IM -Oxygen as above -Nebs per IM  6. History of SSS s/p PPM: -PPM has reached ERI without plans of generator replacement -Battery is not functional, will not be able to obtain any information from device interrogation  -Patient is under Hospice care  7. Hypotension: -Hypotensive currently, likely in the setting of her infection -Concern that she is not adequately perfusing at this time given her hypotension and marked bradycardia  -May need pressors in order to keep a MAP > 65 mmHg  8. HLD: -If patient truly wishes for Hospice care this could be discontinued   9. CAD as above: -No symptoms concerning for angina -On Coumadin in place of  ASA -No beta blocker 2/2 bradycardia   10. Dispo: -Patient has been under Hospice care and has stated that she is a DNR on her problem list -She continues to state she is a DNR -Recommend code status be updated by admitting team from "prior" to current status  -Recommend getting Palliative Care on board to discuss goals of care   Signed, Carola Frost [facility] Pager: [phone] 10/24/2015, 1:47 PM

## 2015-10-25 ENCOUNTER — Inpatient Hospital Stay

## 2015-10-25 DIAGNOSIS — Z789 Other specified health status: Secondary | ICD-10-CM

## 2015-10-25 DIAGNOSIS — Z515 Encounter for palliative care: Secondary | ICD-10-CM

## 2015-10-25 DIAGNOSIS — J9601 Acute respiratory failure with hypoxia: Secondary | ICD-10-CM

## 2015-10-25 LAB — BASIC METABOLIC PANEL
ANION GAP: 10 (ref 5–15)
BUN: 51 mg/dL — AB (ref 6–20)
CALCIUM: 8.4 mg/dL — AB (ref 8.9–10.3)
CHLORIDE: 102 mmol/L (ref 101–111)
CO2: 23 mmol/L (ref 22–32)
CREATININE: 2.18 mg/dL — AB (ref 0.44–1.00)
GFR calc Af Amer: 22 mL/min — ABNORMAL LOW (ref >60)
GFR calc non Af Amer: 19 mL/min — ABNORMAL LOW (ref >60)
GLUCOSE: 147 mg/dL — AB (ref 65–99)
POTASSIUM: 5.1 mmol/L (ref 3.5–5.1)
SODIUM: 135 mmol/L (ref 135–145)

## 2015-10-25 LAB — LACTIC ACID, PLASMA: LACTIC ACID, VENOUS: 2.1 mmol/L — AB (ref 0.5–1.9)

## 2015-10-25 LAB — CBC
HCT: 39.8 % (ref 35.0–47.0)
Hemoglobin: 13.7 g/dL (ref 12.0–16.0)
MCH: 31.7 pg (ref 26.0–34.0)
MCHC: 34.3 g/dL (ref 32.0–36.0)
MCV: 92.3 fL (ref 80.0–100.0)
PLATELETS: 141 10*3/uL — AB (ref 150–440)
RBC: 4.31 MIL/uL (ref 3.80–5.20)
RDW: 15.4 % — AB (ref 11.5–14.5)
WBC: 16.2 10*3/uL — ABNORMAL HIGH (ref 3.6–11.0)

## 2015-10-25 LAB — BLOOD GAS, VENOUS
ACID-BASE DEFICIT: 4.9 mmol/L — AB (ref 0.0–2.0)
BICARBONATE: 23.5 mmol/L (ref 20.0–28.0)
PCO2 VEN: 56 mmHg (ref 44.0–60.0)
Patient temperature: 37
pH, Ven: 7.23 — ABNORMAL LOW (ref 7.250–7.430)

## 2015-10-25 LAB — ECHOCARDIOGRAM COMPLETE
Height: 64 in
Weight: 2045.87 oz

## 2015-10-25 LAB — URINE CULTURE

## 2015-10-25 LAB — PROTIME-INR
INR: 4.65
Prothrombin Time: 45.1 seconds — ABNORMAL HIGH (ref 11.4–15.2)

## 2015-10-25 LAB — POTASSIUM: Potassium: 5.1 mmol/L (ref 3.5–5.1)

## 2015-10-25 MED ORDER — LATANOPROST 0.005 % OP SOLN
1.0000 [drp] | Freq: Every day | OPHTHALMIC | Status: DC
  Administered 2015-10-26 – 2015-10-29 (×4): 1 [drp] via OPHTHALMIC
  Filled 2015-10-25 (×2): qty 2.5

## 2015-10-25 MED ORDER — INFLUENZA VAC SPLIT QUAD 0.5 ML IM SUSY: 0.5000 mL | PREFILLED_SYRINGE | INTRAMUSCULAR | Status: DC | PRN

## 2015-10-25 MED ORDER — DORZOLAMIDE HCL-TIMOLOL MAL 2-0.5 % OP SOLN
1.0000 [drp] | Freq: Two times a day (BID) | OPHTHALMIC | Status: DC
  Administered 2015-10-25: 1 [drp] via OPHTHALMIC

## 2015-10-25 MED ORDER — CHLORHEXIDINE GLUCONATE 0.12 % MT SOLN
15.0000 mL | Freq: Two times a day (BID) | OROMUCOSAL | Status: DC
  Administered 2015-10-25 – 2015-10-30 (×11): 15 mL via OROMUCOSAL
  Filled 2015-10-25 (×10): qty 15

## 2015-10-25 MED ORDER — DORZOLAMIDE HCL 2 % OP SOLN
1.0000 [drp] | Freq: Two times a day (BID) | OPHTHALMIC | Status: DC
  Administered 2015-10-26 – 2015-10-30 (×9): 1 [drp] via OPHTHALMIC
  Filled 2015-10-25: qty 10

## 2015-10-25 MED ORDER — TIMOLOL MALEATE 0.5 % OP SOLN
1.0000 [drp] | Freq: Two times a day (BID) | OPHTHALMIC | Status: DC
Start: 2015-10-25 — End: 2015-10-30
  Administered 2015-10-26 – 2015-10-30 (×7): 1 [drp] via OPHTHALMIC
  Filled 2015-10-25: qty 5

## 2015-10-25 NOTE — Progress Notes (Signed)
Visit made. Patient seen sitting up in bed, back on hiflo, was on Bipap this morning. She expressed great concern over her eye drops, staff RN Pam aware and will administer. Husband at beside very attentive and subdued. Patient's heart rate now running 100-104 through out visit, oxygen saturations remained in low 90's even with movement and conversation. Per chart note review patient and family have met with Palliative Medicine NP Wadie Lessen and wish to continue treatment with hopes for patient to return to baseline. She has received one dose of PRN pain medication and one dose of PRN Zofran. Will continue to follow and provide support through final disposition. Update provided to hospice team. Thank you. Flo Shanks RN, BSN, [place] [facility] [place], [facility] [phone] c

## 2015-10-25 NOTE — Consult Note (Signed)
   [facility]   10/25/2015  Cecil CobbsMargaret N Siegfried Apr 07, 1923 [id number]   Patient screened for potential [facility] Care Management services. Patient is eligible for [facility] Services. This liaison spoke with inpatient care manager who made liaison aware Hospice had been consulted and she was awaiting further discussions related to Hospice services. [facility] would not be appropriate if patient discharges in the care of Hospice, but if patient's post hospital needs change please place a [facility] consult. Inpatient case manager aware of patient's eligibility. For questions please contact:   Enora Trillo RN, BSN [facility]  Hospital Liaison  [phone]) Business Mobile [phone]) Toll free office

## 2015-10-25 NOTE — Consult Note (Signed)
[facility] Consultation     ASSESSMENT/PLAN     PULMONARY A:Acute respiratory failure with large right pleural effusion, small left pleural effusion, bibasilar atelectasis. Possibly secondary to acute unspecified CHF from heart block.  -Debility, deconditioning.  P:   Would not recommend thoracentesis at this point.  --Recommend change to comfort measures.  --Apparently the patient was followed by hospice for comfort, recommend discharge to hospice facility is survives.   CARDIOVASCULAR A: Heart block with acute unspecified congestive heart failure.  -. Her bradycardia has improved. P:  Pt's pacemaker battery has died, and is not being replaced per pts wishes and poor chance of survival.   RENAL A:  Aki P:     GASTROINTESTINAL A:  Cholecystitis with abdominal pain, nausea.  P:   A/p percutaneous cholecystostomy tube--removed.    INFECTIOUS A: Possible sepsis.  P:   On abx.    Pulmonary critical care service will sign off for now, patient appears comfortable, she is DNR/DNI recommend that patient be transferred back to hospice service.  ---------------------------------------  ---------------------------------------   Name: Laurie Larsen MRN: [id number] DOB: 29-Jul-1923    ADMISSION DATE:  10/24/2015    Subjective/REVIEW OF SYSTEMS:   Patient has no particular complaints at this time.  Patient denies chest pain, PND, orthopnea, palpitations, remainder of the review of systems were reviewed with the patient. Pulmonary negative.   VITAL SIGNS: Temp:  [96.5 F (35.8 C)-99 F (37.2 C)] 99 F (37.2 C) (09/28 0800) Pulse Rate:  [32-104] 82 (09/28 0900) Resp:  [13-23] 16 (09/28 0900) BP: (85-165)/(48-96) 151/95 (09/28 0900) SpO2:  [88 %-100 %] 98 % (09/28 0900) FiO2 (%):  [58 %-100 %] 70 % (09/28 0114) Weight:  [122 lb 5.7 oz (55.5 kg)-127 lb 13.9 oz (58 kg)] 122 lb 5.7 oz (55.5 kg) (09/28 0436) HEMODYNAMICS:   VENTILATOR  SETTINGS: FiO2 (%):  [58 %-100 %] 70 % INTAKE / OUTPUT:  Intake/Output Summary (Last 24 hours) at 10/25/15 0937 Last data filed at 10/25/15 0221  Gross per 24 hour  Intake              303 ml  Output               80 ml  Net              223 ml    Physical Examination:   VS: BP (!) 151/95   Pulse 82   Temp 99 F (37.2 C) (Axillary)   Resp 16   Ht 5\' 4"  (1.626 m)   Wt 122 lb 5.7 oz (55.5 kg)   SpO2 98%   BMI 21.00 kg/m   General Appearance: No distress  Neuro:without focal findings, mental status, speech normal,. HEENT: PERRLA, EOM intact, no ptosis. Pulmonary: normal breath sounds., diaphragmatic excursion normal. CardiovascularNormal S1,S2.  No m/r/g.    Abdomen: Benign, Soft, non-tender, No masses, hepatosplenomegaly, No lymphadenopathy Renal:  No costovertebral tenderness  GU:  Not performed at this time. Endoc: No evident thyromegaly, no signs of acromegaly. Skin:   warm, no rashes, no ecchymosis  Extremities: normal, no cyanosis, clubbing, no edema, warm with normal capillary refill.    LABS: Reviewed   LABORATORY PANEL:   CBC  Recent Labs Lab 10/25/15 0618  WBC 16.2*  HGB 13.7  HCT 39.8  PLT 141*    Chemistries   Recent Labs Lab 10/24/15 0909  10/25/15 0618  NA 134*  --  135  K 5.6*  < >  5.1  CL 104  --  102  CO2 17*  --  23  GLUCOSE 153*  --  147*  BUN 41*  --  51*  CREATININE 1.82*  --  2.18*  CALCIUM 8.4*  --  8.4*  MG 2.3  --   --   AST 36  --   --   ALT 30  --   --   ALKPHOS 92  --   --   BILITOT 0.6  --   --   < > = values in this interval not displayed.   Recent Labs Lab 10/24/15 1238  GLUCAP 159*   No results for input(s): PHART, PCO2ART, PO2ART in the last 168 hours.  Recent Labs Lab 10/24/15 0909  AST 36  ALT 30  ALKPHOS 92  BILITOT 0.6  ALBUMIN 3.6    Cardiac Enzymes  Recent Labs Lab 10/24/15 0909  TROPONINI 0.03*    RADIOLOGY:  Dg Chest 1 View  Result Date: 10/25/2015 CLINICAL DATA:  Shortness of  breath. EXAM: CHEST 1 VIEW COMPARISON:  10/24/2015.  08/17/2015. FINDINGS: Cardiac pacer with lead tip in right atrium and right ventricle. Cardiomegaly with bilateral pulmonary infiltrates, right side greater than left. Bilateral pleural effusions, right side greater than left. Similar findings noted on prior exam . No pneumothorax. IMPRESSION: Cardiac pacer with lead tips in right atrium right ventricle. Cardiomegaly with pulmonary vascular prominence and bilateral pulmonary infiltrates consistent with pulmonary edema, right side greater than left. Similar findings noted on prior exam. Bilateral pleural effusions, right side greater left also noted. Electronically Signed   By: Maisie Fushomas  Register   On: 10/25/2015 07:24   Dg Chest Portable 1 View  Result Date: 10/24/2015 CLINICAL DATA:  Shortness of Breath EXAM: PORTABLE CHEST 1 VIEW COMPARISON:  08/17/2015 FINDINGS: Cardiomegaly. Central mild vascular congestion and mild infrahilar interstitial prominence suspicious for mild pulmonary edema. Bilateral small pleural effusion with bilateral basilar atelectasis or infiltrate right greater than left. Double lead cardiac pacemaker is unchanged in position. IMPRESSION: Central mild vascular congestion and mild infrahilar interstitial prominence suspicious for mild pulmonary edema. Bilateral small pleural effusion with bilateral basilar atelectasis or infiltrate right greater than left. Electronically Signed   By: Natasha MeadLiviu  Pop M.D.   On: 10/24/2015 09:45       --Wells Guileseep Ginnie Marich, MD.  Board Certified in Internal Medicine, Pulmonary Medicine, Critical Care Medicine, and Sleep Medicine.  ICU Pager [phone] [facility] Office Number: [phone]  Santiago Gladavid Kasa, M.D.  Stephanie AcreVishal Mungal, M.D.  Billy Fischeravid Simonds, M.D   10/25/2015, 9:37 AM   Critical Care Attestation.  I have personally obtained a history, examined the patient, evaluated laboratory and imaging results, formulated the  assessment and plan and placed orders. The Patient requires high complexity decision making for assessment and support, frequent evaluation and titration of therapies, application of advanced monitoring technologies and extensive interpretation of multiple databases. The patient has critical illness that could lead imminently to failure of 1 or more organ systems and requires the highest level of physician preparedness to intervene.  Critical Care Time devoted to patient care services described in this note is 70 minutes and is exclusive of time spent in procedures.

## 2015-10-25 NOTE — Progress Notes (Signed)
No documented urine since 1330. 30ml documented at that time. No episodes of incontinence. Bladder scanned and revealed. Bincy NP notified.

## 2015-10-25 NOTE — Progress Notes (Signed)
End of shift report given to Pam RN  

## 2015-10-25 NOTE — Progress Notes (Addendum)
ANTICOAGULATION CONSULT NOTE - Follow Up Consult  Pharmacy Consult for warfarin dosing Indication: atrial fibrillation  Allergies  Allergen Reactions  . Amoxicillin Other (See Comments)    Reaction:  Weakness Has patient had a PCN reaction causing immediate rash, facial/tongue/throat swelling, SOB or lightheadedness with hypotension: No Has patient had a PCN reaction causing severe rash involving mucus membranes or skin necrosis: No Has patient had a PCN reaction that required hospitalization No Has patient had a PCN reaction occurring within the last 10 years: No If all of the above answers are "NO", then may proceed with Cephalosporin use.  . Cetylpyridinium Other (See Comments)    Reaction:  Weakness   . Digoxin Other (See Comments)    Reaction:  Weakness and dehydration  . Levaquin [Levofloxacin] Itching  . Risedronate Sodium Rash    Patient Measurements: Height: 5\' 4"  (162.6 cm) Weight: 122 lb 5.7 oz (55.5 kg) IBW/kg (Calculated) : 54.7   Vital Signs: Temp: 99 F (37.2 C) (09/28 0800) Temp Source: Axillary (09/28 0800) BP: 151/95 (09/28 0900) Pulse Rate: 82 (09/28 0900)  Labs:  Recent Labs  10/24/15 0909 10/25/15 0618  HGB 14.3 13.7  HCT 42.9 39.8  PLT 221 141*  LABPROT 22.4* 45.1*  INR 1.94 4.65*  CREATININE 1.82* 2.18*  TROPONINI 0.03*  --     Estimated Creatinine Clearance: 14.8 mL/min (by C-G formula based on SCr of 2.18 mg/dL (H)).   Medications:  Scheduled:  . atorvastatin  20 mg Oral QHS  . chlorhexidine  15 mL Mouth Rinse BID  . docusate sodium  100 mg Oral BID  . dorzolamide  1 drop Both Eyes BID  . feeding supplement (ENSURE ENLIVE)  237 mL Oral BID WC  . furosemide  20 mg Intravenous BID  . Influenza vac split quadrivalent PF  0.5 mL Intramuscular Tomorrow-1000  . ipratropium-albuterol  3 mL Nebulization Q6H  . levothyroxine  112 mcg Oral Daily  . polyethylene glycol  17 g Oral QHS  . sodium chloride flush  3 mL Intravenous Q12H  .  Warfarin - Pharmacist Dosing Inpatient   Does not apply q1800    Assessment: Pt and nursing home report pt is taking 5 mg daily of warfarin for afib (INR goal 2-3). Pt's pacemaker batteries have exhausted and they will not be replaced.  Goal of Therapy:  INR 2-3 Monitor platelets by anticoagulation protocol: Yes   Plan:  9/27: Continue home medication dose. Possible interaction with azithromycin 9/28: INR elevated. Will hold dose and recheck with am labs.   Date                 INR                  Dose     9/27                 1.94                 5 mg     9/28  4.65  HOLD   Horris LatinoHolly Gilliam, PharmD Pharmacy Resident 10/25/2015 10:38 AM

## 2015-10-25 NOTE — Progress Notes (Signed)
Pt placed on BIPAP due to O2 sat dropping and having to increase FiO2 on HFNC.

## 2015-10-25 NOTE — Progress Notes (Signed)
[facility] [place] [facility]   PATIENT NAME: Laurie ClinesMargaret Larsen    MR#:  [id number]  DATE OF BIRTH:  1923-08-15  SUBJECTIVE: 82 year old female patient admitted for acute respiratory failure because of shortness of breath, heart failure. She'll received BiPAP, Lasix. Agents as she feels better today. On high flow oxygen. Discussed with son. She wants to be treated aggressively at this time. She is a DO NOT RESUSCITATE but requesting medications, antibiotics., BiPAP,   CHIEF COMPLAINT:   Chief Complaint  Patient presents with  . Weakness    REVIEW OF SYSTEMS:    Review of Systems  Respiratory: Positive for cough and shortness of breath.   Cardiovascular: Negative for chest pain, palpitations, orthopnea and leg swelling.    Nutrition:  Tolerating Diet: Tolerating PT:      DRUG ALLERGIES:   Allergies  Allergen Reactions  . Amoxicillin Other (See Comments)    Reaction:  Weakness Has patient had a PCN reaction causing immediate rash, facial/tongue/throat swelling, SOB or lightheadedness with hypotension: No Has patient had a PCN reaction causing severe rash involving mucus membranes or skin necrosis: No Has patient had a PCN reaction that required hospitalization No Has patient had a PCN reaction occurring within the last 10 years: No If all of the above answers are "NO", then may proceed with Cephalosporin use.  . Cetylpyridinium Other (See Comments)    Reaction:  Weakness   . Digoxin Other (See Comments)    Reaction:  Weakness and dehydration  . Levaquin [Levofloxacin] Itching  . Risedronate Sodium Rash    VITALS:  Blood pressure (!) 142/86, pulse (!) 109, temperature 99 F (37.2 C), temperature source Axillary, resp. rate 19, height 5\' 4"  (1.626 m), weight 55.5 kg (122 lb 5.7 oz), SpO2 95 %.  PHYSICAL EXAMINATION:   Physical Exam  GENERAL:  82 y.o.-year-old patient lying in the bed with no acute distress.  EYES: Pupils equal, round,  reactive to light and accommodation. No scleral icterus. Extraocular muscles intact.  HEENT: Head atraumatic, normocephalic. Oropharynx and nasopharynx clear.  NECK:  Supple, no jugular venous distention. No thyroid enlargement, no tenderness.  LUNGS: Normal breath sounds bilaterally, no wheezing, rales,rhonchi or crepitation. No use of accessory muscles of respiration.  CARDIOVASCULAR: S1, S2 normal. No murmurs, rubs, or gallops.  ABDOMEN: Soft, nontender, nondistended. Bowel sounds present. No organomegaly or mass.  EXTREMITIES: No pedal edema, cyanosis, or clubbing.  NEUROLOGIC: Cranial nerves II through XII are intact. Muscle strength 5/5 in all extremities. Sensation intact. Gait not checked.  PSYCHIATRIC: The patient is alert and oriented x 3.  SKIN: No obvious rash, lesion, or ulcer.    LABORATORY PANEL:   CBC  Recent Labs Lab 10/25/15 0618  WBC 16.2*  HGB 13.7  HCT 39.8  PLT 141*   ------------------------------------------------------------------------------------------------------------------  Chemistries   Recent Labs Lab 10/24/15 0909  10/25/15 0618  NA 134*  --  135  K 5.6*  < > 5.1  CL 104  --  102  CO2 17*  --  23  GLUCOSE 153*  --  147*  BUN 41*  --  51*  CREATININE 1.82*  --  2.18*  CALCIUM 8.4*  --  8.4*  MG 2.3  --   --   AST 36  --   --   ALT 30  --   --   ALKPHOS 92  --   --   BILITOT 0.6  --   --   < > =  values in this interval not displayed. ------------------------------------------------------------------------------------------------------------------  Cardiac Enzymes  Recent Labs Lab 10/24/15 0909  TROPONINI 0.03*   ------------------------------------------------------------------------------------------------------------------  RADIOLOGY:  Dg Chest 1 View  Result Date: 10/25/2015 CLINICAL DATA:  Shortness of breath. EXAM: CHEST 1 VIEW COMPARISON:  10/24/2015.  08/17/2015. FINDINGS: Cardiac pacer with lead tip in right atrium and  right ventricle. Cardiomegaly with bilateral pulmonary infiltrates, right side greater than left. Bilateral pleural effusions, right side greater than left. Similar findings noted on prior exam . No pneumothorax. IMPRESSION: Cardiac pacer with lead tips in right atrium right ventricle. Cardiomegaly with pulmonary vascular prominence and bilateral pulmonary infiltrates consistent with pulmonary edema, right side greater than left. Similar findings noted on prior exam. Bilateral pleural effusions, right side greater left also noted. Electronically Signed   By: Maisie Fus  Register   On: 10/25/2015 07:24   Dg Chest Portable 1 View  Result Date: 10/24/2015 CLINICAL DATA:  Shortness of Breath EXAM: PORTABLE CHEST 1 VIEW COMPARISON:  08/17/2015 FINDINGS: Cardiomegaly. Central mild vascular congestion and mild infrahilar interstitial prominence suspicious for mild pulmonary edema. Bilateral small pleural effusion with bilateral basilar atelectasis or infiltrate right greater than left. Double lead cardiac pacemaker is unchanged in position. IMPRESSION: Central mild vascular congestion and mild infrahilar interstitial prominence suspicious for mild pulmonary edema. Bilateral small pleural effusion with bilateral basilar atelectasis or infiltrate right greater than left. Electronically Signed   By: Natasha Mead M.D.   On: 10/24/2015 09:45     ASSESSMENT AND PLAN:   Principal Problem:   Acute respiratory failure with hypoxia (HCC) Active Problems:   ATRIAL FIBRILLATION, CHRONIC   SSS (sick sinus syndrome) (HCC)   Chronic anticoagulation   Acute on chronic diastolic CHF (congestive heart failure) (HCC)   DNR (do not resuscitate)   Septic shock (HCC)   Hyperkalemia   Medication adverse effect   Drug-induced bradycardia   Pacer at end of battery life   Acute renal failure (HCC)  #1 acute respiratory failure secondary to acute heart failure. Acute on chronic diastolic heart failure: Patient respiratory status  improving with IV Lasix. But because of advanced age, renal insufficiency patient prognosis is poor. #2 atrial fibrillation chronic, had bradycardia at yesterday but today bradycardia resolved. INR elevated to more than 4, hold the Coumadin, appreciate pharmacy help / Patient to atenolol, Cardizem held because of bradycardia yesterday. #3. hyperkalemia treated. 4.Acute renal failure: Likely due to CHF: Renal function is slightly worse today. #5 with lactic acidosis: Possible sepsis: WBC elevated 16.2. Monitor blood cultures, urine culture data. Discussed with son extensively, patient followed with hospice but they are not aware of hospice goal and patient also not cooperating with hospice.. I spoke with hospice NP. [name]. Son would like aggressive treatment at this time for her respiratory status as per patient wishes. #6 history of cholecystitis, cholecystostomy tube placement before status post removal. LFTs normal.  D/w son  All the records are reviewed and case discussed with Care Management/Social Workerr. Management plans discussed with the patient, family and they are in agreement.  CODE STATUS: DNR  TOTAL TIME TAKING CARE OF THIS PATIENT: .   POSSIBLE D/C IN 2-3 DAYS, DEPENDING ON CLINICAL CONDITION.   Katha Hamming M.D on 10/25/2015 at 1:51 PM  Between 7am to 6pm - Pager - [phone]  After 6pm go to [email or web address] - password [email or web address]  [facility]  Office  [phone]  CC: Primary care physician; Sherlene Shams, MD

## 2015-10-25 NOTE — Clinical Social Work Note (Signed)
CSW aware [facility] and that [facility]. CSW clarified that patient is currently in independent living with hospice services. If patient requires a higher level of care at discharge or her discharge disposition changes, CSW will assist where appropriate.  York SpanielMonica Horacio Werth MSW,LCSW [phone]

## 2015-10-25 NOTE — Progress Notes (Signed)
Patient: Laurie Larsen / Admit Date: 10/24/2015 / Date of Encounter: 10/25/2015, 8:49 AM   Subjective: No acute overnight events. Heart rate has improved with the holding of rate limiting medications as well as improvement in her hyperkalemia with a peak of 6.6 in the setting of ARF. Resting comfortably this morning on BiPAP.   Review of Systems: Review of Systems  Unable to perform ROS: Medical condition  On BiPAP  Objective: Telemetry: Afib 70's to low 100's bpm, currently 90's bpm Physical Exam: Blood pressure 119/73, pulse 95, temperature 99 F (37.2 C), temperature source Axillary, resp. rate 15, height 5\' 4"  (1.626 m), weight 122 lb 5.7 oz (55.5 kg), SpO2 100 %. Body mass index is 21 kg/m. General: Frail appearing, in no acute distress. Head: Normocephalic, atraumatic, sclera non-icteric, no xanthomas, nares are without discharge. Neck: Negative for carotid bruits. JVP not elevated. Lungs: Decreased breath sounds bilaterally. Breathing is unlabored on BiPAP. Heart: Irregularly-irregular S1 S2 without murmurs, rubs, or gallops.  Abdomen: Soft, non-tender, non-distended with normoactive bowel sounds. No rebound/guarding. Extremities: No clubbing or cyanosis. No edema. Distal pedal pulses are 2+ and equal bilaterally. Neuro: Alert and oriented X 3. Moves all extremities spontaneously. Psych:  Responds to questions appropriately with a normal affect.   Intake/Output Summary (Last 24 hours) at 10/25/15 0849 Last data filed at 10/25/15 0221  Gross per 24 hour  Intake              303 ml  Output               80 ml  Net              223 ml    Inpatient Medications:  . atorvastatin  20 mg Oral QHS  . docusate sodium  100 mg Oral BID  . dorzolamide  1 drop Both Eyes BID  . feeding supplement (ENSURE ENLIVE)  237 mL Oral BID WC  . furosemide  20 mg Intravenous BID  . Influenza vac split quadrivalent PF  0.5 mL Intramuscular Tomorrow-1000  . ipratropium-albuterol  3 mL  Nebulization Q6H  . levothyroxine  112 mcg Oral Daily  . polyethylene glycol  17 g Oral QHS  . sodium chloride flush  3 mL Intravenous Q12H  . Warfarin - Pharmacist Dosing Inpatient   Does not apply q1800   Infusions:    Labs:  Recent Labs  10/24/15 0909  10/25/15 0019 10/25/15 0618  NA 134*  --   --  135  K 5.6*  < > 5.1 5.1  CL 104  --   --  102  CO2 17*  --   --  23  GLUCOSE 153*  --   --  147*  BUN 41*  --   --  51*  CREATININE 1.82*  --   --  2.18*  CALCIUM 8.4*  --   --  8.4*  MG 2.3  --   --   --   < > = values in this interval not displayed.  Recent Labs  10/24/15 0909  AST 36  ALT 30  ALKPHOS 92  BILITOT 0.6  PROT 6.7  ALBUMIN 3.6    Recent Labs  10/24/15 0909 10/25/15 0618  WBC 10.9 16.2*  NEUTROABS 8.2*  --   HGB 14.3 13.7  HCT 42.9 39.8  MCV 94.3 92.3  PLT 221 141*    Recent Labs  10/24/15 0909  TROPONINI 0.03*   Invalid input(s): POCBNP No results for  input(s): HGBA1C in the last 72 hours.   Weights: Filed Weights   10/24/15 0913 10/24/15 1239 10/25/15 0436  Weight: 100 lb 2 oz (45.4 kg) 127 lb 13.9 oz (58 kg) 122 lb 5.7 oz (55.5 kg)     Radiology/Studies:  Dg Chest 1 View  Result Date: 10/25/2015 CLINICAL DATA:  Shortness of breath. EXAM: CHEST 1 VIEW COMPARISON:  10/24/2015.  08/17/2015. FINDINGS: Cardiac pacer with lead tip in right atrium and right ventricle. Cardiomegaly with bilateral pulmonary infiltrates, right side greater than left. Bilateral pleural effusions, right side greater than left. Similar findings noted on prior exam . No pneumothorax. IMPRESSION: Cardiac pacer with lead tips in right atrium right ventricle. Cardiomegaly with pulmonary vascular prominence and bilateral pulmonary infiltrates consistent with pulmonary edema, right side greater than left. Similar findings noted on prior exam. Bilateral pleural effusions, right side greater left also noted. Electronically Signed   By: Maisie Fus  Register   On: 10/25/2015  07:24   Dg Chest Portable 1 View  Result Date: 10/24/2015 CLINICAL DATA:  Shortness of Breath EXAM: PORTABLE CHEST 1 VIEW COMPARISON:  08/17/2015 FINDINGS: Cardiomegaly. Central mild vascular congestion and mild infrahilar interstitial prominence suspicious for mild pulmonary edema. Bilateral small pleural effusion with bilateral basilar atelectasis or infiltrate right greater than left. Double lead cardiac pacemaker is unchanged in position. IMPRESSION: Central mild vascular congestion and mild infrahilar interstitial prominence suspicious for mild pulmonary edema. Bilateral small pleural effusion with bilateral basilar atelectasis or infiltrate right greater than left. Electronically Signed   By: Natasha Mead M.D.   On: 10/24/2015 09:45   Ir Cholangiogram Existing Tube  Result Date: 10/19/2015 INDICATION: Chronic cholecystitis, chronic indwelling cholecystostomy which has been capped for several months and well tolerated. EXAM: CHOLANGIOGRAM VIA EXISTING CATHETER MEDICATIONS: None. ANESTHESIA/SEDATION: None. FLUOROSCOPY TIME:  Fluoroscopy Time: 0 minutes 36 seconds (5 mGy). COMPLICATIONS: None immediate. PROCEDURE: Informed written consent was obtained from the patient after a thorough discussion of the procedural risks, benefits and alternatives. All questions were addressed. Maximal Sterile Barrier Technique was utilized including caps, mask, sterile gowns, sterile gloves, sterile drape, hand hygiene and skin antiseptic. A timeout was performed prior to the initiation of the procedure. Under sterile conditions, the existing cholecystostomy was injected with contrast for cholangiogram. This demonstrates a collapsed gallbladder. Cystic duct, common hepatic duct, common bile duct are patent. Contrast easily drains into the duodenum. Gallbladder was decompressed by syringe aspiration. Cholecystostomy was cut and removed without difficulty. Sterile dressing applied. No immediate complication. Findings discussed  with the patient and her daughter. IMPRESSION: Patent biliary system. Successful cholecystostomy removal. Electronically Signed   By: Judie Petit.  Shick M.D.   On: 10/19/2015 14:01     Assessment and Plan  Principal Problem:   Acute respiratory failure with hypoxia (HCC) Active Problems:   Acute on chronic diastolic CHF (congestive heart failure) (HCC)   DNR (do not resuscitate)   Septic shock (HCC)   Hyperkalemia   Medication adverse effect   Drug-induced bradycardia   Acute renal failure (HCC)   ATRIAL FIBRILLATION, CHRONIC   SSS (sick sinus syndrome) (HCC)   Chronic anticoagulation   Pacer at end of battery life    1. Acute on chronic respiratory failure with hypoxia requiring BiPAP: -Likely multifactorial including acute on chronic diastolic CHF and possible PNA -Wean oxygen as able -She has been a Hospice patient most recently, would limit any invasive procedure -PCCM without plans for thoracentesis at this time  -EKG is documented in ED physician note, however  there is no order for EKG in Epic, nor is there an EKG in the paper chart for review. Perhaps this is being scanned in. Nonetheless, I am unable to review this study -EKG pending  2. Chronic Afib with bradycardia: -Heart rate improved with holding of rate limiting medications and improvement in her hyperkalemia -Continue to hold atenolol, Cardizem, and Timolol -If heart rate becomes tachycardic may need prn metoprolol  -Her PPM has been ERI and is now not functional -There have been no plans to replace her generator and this continues to be the case given her advanced age and comorbidities  -Continue Coumadin per pharmacy -CHADS2VASc at least 6 (CHF, HTN, age x 2, vascular disease, female)  3. Acute on chronic diastolic CHF/pulmonary HTN/right heart failure: -Her labs actually show acute renal failure with signs of dehydration  -CXR does show vascular congestion and her BNP is elevated -Gentle diuresis with IV Lasix -Not  on beta blocker at this time given bradycardia  4. Hyperkalemia: -Improved -Continue to hold lisinopril   5. Possible PNA with septic shock with SBP in the 80's mmHg: -Shock resolved -On azithromycin per IM -Oxygen as above -Nebs per IM  6. History of SSS s/p PPM: -PPM has reached ERI without plans of generator replacement -Battery is not functional, will not be able to obtain any information from device interrogation  -Patient is under Hospice care  7. Hypotension: -Improved  8. HLD: -If patient truly wishes for Hospice care this could be discontinued   9. CAD as above: -No symptoms concerning for angina -On Coumadin in place of ASA -No beta blocker 2/2 bradycardia   10. Glaucoma: -Defer management to IM given the need to hold Timolol 2/2 bradycardia  11. Dispo: -Patient has been under Hospice care and has stated that she is a DNR on her problem list -She continues to state she is a DNR -Recommend code status be updated by admitting team from "prior" to current status  -Recommend getting Palliative Care on board to discuss goals of care  Signed, Carola FrostRyan Romy Ipock, PA-C [facility] Pager: [phone] 10/25/2015, 8:49 AM

## 2015-10-25 NOTE — Consult Note (Signed)
Consultation Note Date: 10/25/2015   Patient Name: Laurie Larsen  DOB: Oct 23, 1923  MRN: [id number]  Age / Sex: 82 y.o., female  PCP: Laurie Shams, MD Referring Physician: Katha Hamming, MD  Reason for Consultation: Establishing goals of care and Psychosocial/spiritual support  HPI/Patient Profile: 82 y.o. female   admitted on 10/24/2015 with a known history of CHF, COPD,  cholecystitis, atrial fibrillation, bradycardia  dysrhythmia status post pacemaker.   She presented to the ED with worsening generalized weakness and shortness of breath several days.   She was found in respiratory failure, SAT was in the 80s on room air which improved to the upper 80s on a non-rebreather. She was put on BiPAP in the ED.  BNP elevated at 1549, chest x-ray concerning for pulmonary edema. She was treated with Lasix 40 mg 1 dose.  She also has bradycardia base heart rate 30-40's. Dr. Don Larsen spoke with cardiology and patient's pacer is not working. Pacers battery is now exhausted and patient's battery was not replaced due to her poor health, chronic comorbidities, risk of surgery, and patient's wishes.   Currently she is under hospice services at home.  There is some confusion regarding her GOC in that she is currently being treated in the ICU.    Clinical Assessment and Goals of Care:   This NP Laurie Larsen reviewed medical records, received report from team, assessed the patient and then meet at the patient's bedside along with her son/Laurie Larsen/HPOA  to discuss diagnosis, prognosis, GOC, EOL wishes disposition and options.  A detailed discussion was had today regarding advanced directives.  Concepts specific to code status, artifical feeding and hydration, continued IV antibiotics and rehospitalization was had.  The difference between a aggressive medical intervention path  and a palliative comfort care path for  this patient at this time was had.  Values and goals of care important to patient and family were attempted to be elicited.  Patient's  Participation limited by BiPap  MOST form introduced  Concept of Hospice and Palliative Care were detailed  Natural trajectory and expectations at EOL were discussed.  Questions and concerns addressed.   Family encouraged to call with questions or concerns.  PMT will continue to support holistically.   SUMMARY OF RECOMMENDATIONS    At this time family is open to life prolonging measures.  Treat the treatable, hopeful for return to baseline.  Code Status/Advance Care Planning: DNR   Symptom Management:   We discussed the use of opioids to treat dyspnea specifically in situations of not escalating respiratory support with interventions such as BiPap and High flow oxygen.  Palliative Prophylaxis:   Aspiration, Bowel Regimen, Delirium Protocol, Frequent Pain Assessment and Oral Care   Psycho-social/Spiritual:   Desire for further Chaplaincy support: no  Additional Recommendations: Education on Hospice  Prognosis:   - Less than 6 months  Discharge Planning: to be determined      Primary Diagnoses: Present on Admission: . Acute respiratory failure with hypoxia (HCC) . Septic shock (HCC) . Hyperkalemia .  Medication adverse effect . ATRIAL FIBRILLATION, CHRONIC . Drug-induced bradycardia . Acute on chronic diastolic CHF (congestive heart failure) (HCC) . DNR (do not resuscitate) . Acute renal failure (HCC) . SSS (sick sinus syndrome) (HCC)   I have reviewed the medical record, interviewed the patient and family, and examined the patient. The following aspects are pertinent.  Past Medical History:  Diagnosis Date  . Acalculous cholecystitis   . Allergy   . Arthritis   . Back complaints   . Bronchitis   . CAD (coronary artery disease)    a. 06/22/03 Cath: 100% dLCX-->Med managed.  . CHF (congestive heart failure) (HCC)   . Chronic  diastolic CHF (congestive heart failure), NYHA class 1 (HCC)    a. 04/2013 Echo: EF 50-55%, triv MR, mildly dil LA, PASP , mild TR, mild-mod RA dil. b. echo 02/2015: EF 55-60%, mild MR, mild-mod TR, PA peak pressure 72 mm Hg  . Chronic pain    a. ? due to arthritis  . Chronic Right Pleural Effusion    a. 07/2012 s/p thoracentesis - Followed by Dr. Delford [facility]en [facility]. b. 02/2015: s/p repeat R thoracentesis  . Colon polyps   . Colon polyps   . Diverticulitis large intestine   . Glaucoma   . Hay fever   . History of blood transfusion   . HTN (hypertension)   . Hyperlipidemia   . Hypothyroidism   . Permanent atrial fibrillation (HCC)    a. Chronic Coumadin.  [name] Phlebitis    a. after pacemeker placement in Jun 21, 2008.  . Pulmonary HTN (HCC)    a. PASP on echo 04/2013. b. PASP 72 mmHg on echo in 02/2015  . Rash/skin eruption    a. Multiple episodes - wide distribution-intermittent, possibly drug rash.  . Sick sinus syndrome (HCC)    a. 06/21/08 s/p MDT EnRhythm DC PPM (Laurie Larsen).  . Transfusion history    a. After childbirth 60 years ago Jun 22, 1955)  . Urine incontinence    Social History   Social History  . Marital status: Married    Spouse name: Laurie Larsen  . Number of children: 3  . Years of education: 55   Occupational History  . RETIRED Retired   Social History Main Topics  . Smoking status: Never Smoker  . Smokeless tobacco: Never Used  . Alcohol use No  . Drug use: No  . Sexual activity: No   Other Topics Concern  . None   Social History Narrative   Lives with husband in [place]. Three sons.      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     Married 1948.    3 sons - 2050-06-22, 06-22-55, Jun 21, 2056; 5 grandchildren-one deceased -  OD @ 79.        End of Life Care: no prolonged heroic measures, i.e. Artificial feeding or hydration; DNR; no prolonged intubation.   Family History  Problem Relation  Age of Onset  . Cancer Mother     colon with mets  . Diabetes Mother   . Heart disease Father   . Hyperlipidemia Father   . Hypertension Father   . Heart attack Father   . Stroke Brother     brother who died 06-24-2022  . Heart attack Brother   . Cancer Paternal Aunt     breast  . Cancer Cousin     uterine, lung cancer   Scheduled Meds: .  atorvastatin  20 mg Oral QHS  . chlorhexidine  15 mL Mouth Rinse BID  . docusate sodium  100 mg Oral BID  . dorzolamide  1 drop Both Eyes BID  . feeding supplement (ENSURE ENLIVE)  237 mL Oral BID WC  . furosemide  20 mg Intravenous BID  . Influenza vac split quadrivalent PF  0.5 mL Intramuscular Tomorrow-1000  . ipratropium-albuterol  3 mL Nebulization Q6H  . levothyroxine  112 mcg Oral Daily  . polyethylene glycol  17 g Oral QHS  . sodium chloride flush  3 mL Intravenous Q12H  . Warfarin - Pharmacist Dosing Inpatient   Does not apply q1800   Continuous Infusions:  PRN Meds:.sodium chloride, acetaminophen **OR** acetaminophen, albuterol, HYDROcodone-acetaminophen, morphine CONCENTRATE, ondansetron **OR** ondansetron (ZOFRAN) IV, senna-docusate, sodium chloride flush Medications Prior to Admission:  Prior to Admission medications   Medication Sig Start Date End Date Taking? Authorizing Provider  acetaminophen (TYLENOL) 650 MG CR tablet Take 1,300 mg by mouth at bedtime as needed for pain.   Yes Historical Provider, MD  atenolol (TENORMIN) 25 MG tablet TAKE ONE TABLET TWICE A DAY Patient taking differently: TAKE 25 MG BY MOUTH TWICE A DAY 10/04/15  Yes Peter M SwazilandJordan, MD  atorvastatin (LIPITOR) 20 MG tablet Take 1 tablet (20 mg total) by mouth at bedtime. 08/28/15  Yes Ellsworth LennoxBrittany M Strader, PA  diltiazem (CARDIZEM CD) 180 MG 24 hr capsule Take 180 mg by mouth daily.   Yes Historical Provider, MD  docusate sodium (COLACE) 100 MG capsule Take 1 capsule (100 mg total) by mouth 2 (two) times daily. 03/29/15  Yes Ramonita LabAruna Gouru, MD  dorzolamide-timolol (COSOPT)  22.3-6.8 MG/ML ophthalmic solution Place 1 drop into both eyes 2 (two) times daily.    Yes Historical Provider, MD  feeding supplement, ENSURE ENLIVE, (ENSURE ENLIVE) LIQD Take 237 mLs by mouth 2 (two) times daily with a meal. 03/29/15  Yes Ramonita LabAruna Gouru, MD  furosemide (LASIX) 20 MG tablet Take 1 tablet (20 mg total) by mouth 2 (two) times daily. 08/28/15  Yes Ellsworth LennoxBrittany M Strader, PA  HYDROcodone-acetaminophen (NORCO) 10-325 MG tablet Take 1 tablet by mouth 3 (three) times daily as needed for moderate pain or severe pain. 09/14/15  Yes Laurie Shamseresa L Tullo, MD  ipratropium-albuterol (DUONEB) 0.5-2.5 (3) MG/3ML SOLN Take 3 mLs by nebulization every 6 (six) hours. 03/29/15  Yes Ramonita LabAruna Gouru, MD  levothyroxine (SYNTHROID, LEVOTHROID) 112 MCG tablet Take 1 tablet (112 mcg total) by mouth daily. 07/09/15  Yes Shelia MediaJennifer A Walker, MD  Morphine Sulfate (MORPHINE CONCENTRATE) 10 mg / 0.5 ml concentrated solution Take 0.13 mLs (2.6 mg total) by mouth every 2 (two) hours as needed for severe pain or shortness of breath. 10/10/15  Yes Laurie Shamseresa L Tullo, MD  polyethylene glycol (MIRALAX / GLYCOLAX) packet Take 17 g by mouth at bedtime.    Yes Historical Provider, MD  potassium chloride (K-DUR) 10 MEQ tablet Take 1 tablet (10 mEq total) by mouth 2 (two) times daily. 08/28/15  Yes Ellsworth LennoxBrittany M Strader, PA  traMADol (ULTRAM) 50 MG tablet Take 1 tablet (50 mg total) by mouth 3 (three) times daily as needed for moderate pain. Reported on 06/20/2015 08/17/15  Yes Shelia MediaJennifer A Walker, MD  Travoprost, BAK Free, (TRAVATAN) 0.004 % SOLN ophthalmic solution Place 1 drop into both eyes at bedtime.   Yes Historical Provider, MD  Vitamin D, Ergocalciferol, (DRISDOL) 50000 UNITS CAPS capsule Take 50,000 Units by mouth every 7 (seven) days. Pt takes on Saturday.   Yes  Historical Provider, MD  warfarin (COUMADIN) 5 MG tablet Take 5 mg by mouth daily at 6 PM.   Yes Historical Provider, MD  dexamethasone (DECADRON) 2 MG tablet Take 1 tablet (2 mg total) by mouth 2  (two) times daily with a meal. Last doe by 2 pm Patient not taking: Reported on 10/24/2015 10/10/15   Laurie Shams, MD  doxycycline (VIBRA-TABS) 100 MG tablet Take 1 tablet (100 mg total) by mouth 2 (two) times daily. Patient not taking: Reported on 10/24/2015 08/20/15   Shelia Media, MD  LORazepam (ATIVAN) 0.5 MG tablet Take 0.5 tablets (0.25 mg total) by mouth at bedtime. Patient not taking: Reported on 10/24/2015 10/10/15   Laurie Shams, MD   Allergies  Allergen Reactions  . Amoxicillin Other (See Comments)    Reaction:  Weakness Has patient had a PCN reaction causing immediate rash, facial/tongue/throat swelling, SOB or lightheadedness with hypotension: No Has patient had a PCN reaction causing severe rash involving mucus membranes or skin necrosis: No Has patient had a PCN reaction that required hospitalization No Has patient had a PCN reaction occurring within the last 10 years: No If all of the above answers are "NO", then may proceed with Cephalosporin use.  . Cetylpyridinium Other (See Comments)    Reaction:  Weakness   . Digoxin Other (See Comments)    Reaction:  Weakness and dehydration  . Levaquin [Levofloxacin] Itching  . Risedronate Sodium Rash   Review of Systems  Unable to perform ROS: Acuity of condition    Physical Exam  Vital Signs: BP (!) 167/104   Pulse 92   Temp 99 F (37.2 C) (Axillary)   Resp 18   Ht 5\' 4"  (1.626 m)   Wt 55.5 kg (122 lb 5.7 oz)   SpO2 98%   BMI 21.00 kg/m  Pain Assessment: No/denies pain   Pain Score: 0-No pain   SpO2: SpO2: 98 % O2 Device:SpO2: 98 % O2 Flow Rate: .O2 Flow Rate (L/min): 40 L/min  IO: Intake/output summary:  Intake/Output Summary (Last 24 hours) at 10/25/15 1209 Last data filed at 10/25/15 0947  Gross per 24 hour  Intake              253 ml  Output              380 ml  Net             -127 ml    LBM: Last BM Date: 10/23/15 Baseline Weight: Weight: 45.4 kg (100 lb 2 oz) Most recent weight: Weight:  55.5 kg (122 lb 5.7 oz)      Palliative Assessment/Data: 30 % at best   Discussed with Dr Luberta Mutter   PMT will f/u on Monday if patient remains IP status  Time In: 1300 Time Out: 1415 Time Total: 75 min Greater than 50%  of this time was spent counseling and coordinating care related to the above assessment and plan.  Signed by: Laurie Creed, NP   Please contact Palliative Medicine Team phone at [phone] for questions and concerns.  For individual provider: See Loretha Stapler

## 2015-10-26 ENCOUNTER — Inpatient Hospital Stay

## 2015-10-26 DIAGNOSIS — I482 Chronic atrial fibrillation: Secondary | ICD-10-CM

## 2015-10-26 DIAGNOSIS — Z45010 Encounter for checking and testing of cardiac pacemaker pulse generator [battery]: Secondary | ICD-10-CM

## 2015-10-26 DIAGNOSIS — I5033 Acute on chronic diastolic (congestive) heart failure: Secondary | ICD-10-CM

## 2015-10-26 DIAGNOSIS — I495 Sick sinus syndrome: Secondary | ICD-10-CM

## 2015-10-26 DIAGNOSIS — R0600 Dyspnea, unspecified: Secondary | ICD-10-CM

## 2015-10-26 DIAGNOSIS — I509 Heart failure, unspecified: Secondary | ICD-10-CM

## 2015-10-26 DIAGNOSIS — J90 Pleural effusion, not elsewhere classified: Secondary | ICD-10-CM

## 2015-10-26 LAB — BASIC METABOLIC PANEL
ANION GAP: 6 (ref 5–15)
BUN: 60 mg/dL — ABNORMAL HIGH (ref 6–20)
CHLORIDE: 103 mmol/L (ref 101–111)
CO2: 27 mmol/L (ref 22–32)
Calcium: 8.3 mg/dL — ABNORMAL LOW (ref 8.9–10.3)
Creatinine, Ser: 1.69 mg/dL — ABNORMAL HIGH (ref 0.44–1.00)
GFR calc Af Amer: 30 mL/min — ABNORMAL LOW (ref >60)
GFR calc non Af Amer: 26 mL/min — ABNORMAL LOW (ref >60)
Glucose, Bld: 126 mg/dL — ABNORMAL HIGH (ref 65–99)
POTASSIUM: 4.6 mmol/L (ref 3.5–5.1)
Sodium: 136 mmol/L (ref 135–145)

## 2015-10-26 LAB — CBC
HCT: 37.8 % (ref 35.0–47.0)
HEMOGLOBIN: 13 g/dL (ref 12.0–16.0)
MCH: 31.6 pg (ref 26.0–34.0)
MCHC: 34.4 g/dL (ref 32.0–36.0)
MCV: 91.9 fL (ref 80.0–100.0)
PLATELETS: 155 10*3/uL (ref 150–440)
RBC: 4.11 MIL/uL (ref 3.80–5.20)
RDW: 15.5 % — ABNORMAL HIGH (ref 11.5–14.5)
WBC: 15.4 10*3/uL — AB (ref 3.6–11.0)

## 2015-10-26 LAB — PROTIME-INR
INR: 5.76 — AB
PROTHROMBIN TIME: 53.6 s — AB (ref 11.4–15.2)

## 2015-10-26 NOTE — Progress Notes (Signed)
ANTICOAGULATION CONSULT NOTE - Follow Up Consult  Pharmacy Consult for warfarin dosing Indication: atrial fibrillation  Allergies  Allergen Reactions  . Amoxicillin Other (See Comments)    Reaction:  Weakness Has patient had a PCN reaction causing immediate rash, facial/tongue/throat swelling, SOB or lightheadedness with hypotension: No Has patient had a PCN reaction causing severe rash involving mucus membranes or skin necrosis: No Has patient had a PCN reaction that required hospitalization No Has patient had a PCN reaction occurring within the last 10 years: No If all of the above answers are "NO", then may proceed with Cephalosporin use.  . Cetylpyridinium Other (See Comments)    Reaction:  Weakness   . Digoxin Other (See Comments)    Reaction:  Weakness and dehydration  . Levaquin [Levofloxacin] Itching  . Risedronate Sodium Rash    Patient Measurements: Height: 5\' 4"  (162.6 cm) Weight: 119 lb 14.9 oz (54.4 kg) IBW/kg (Calculated) : 54.7   Vital Signs: Temp: 98.7 F (37.1 C) (09/29 1100) Temp Source: Oral (09/29 1100) BP: 120/73 (09/29 1100) Pulse Rate: 84 (09/29 1100)  Labs:  Recent Labs  10/24/15 0909 10/25/15 0618 10/26/15 0534  HGB 14.3 13.7 13.0  HCT 42.9 39.8 37.8  PLT 221 141* 155  LABPROT 22.4* 45.1* 53.6*  INR 1.94 4.65* 5.76*  CREATININE 1.82* 2.18* 1.69*  TROPONINI 0.03*  --   --     Estimated Creatinine Clearance: 19 mL/min (by C-G formula based on SCr of 1.69 mg/dL (H)).   Medications:  Scheduled:  . atorvastatin  20 mg Oral QHS  . chlorhexidine  15 mL Mouth Rinse BID  . docusate sodium  100 mg Oral BID  . timolol  1 drop Both Eyes BID   And  . dorzolamide  1 drop Both Eyes BID  . feeding supplement (ENSURE ENLIVE)  237 mL Oral BID WC  . furosemide  20 mg Intravenous BID  . ipratropium-albuterol  3 mL Nebulization Q6H  . latanoprost  1 drop Both Eyes QHS  . levothyroxine  112 mcg Oral Daily  . polyethylene glycol  17 g Oral QHS  .  sodium chloride flush  3 mL Intravenous Q12H  . Warfarin - Pharmacist Dosing Inpatient   Does not apply q1800    Assessment: Pt and nursing home report pt is taking 5 mg daily of warfarin for afib (INR goal 2-3). Pt's pacemaker batteries have exhausted and they will not be replaced.  Goal of Therapy:  INR 2-3 Monitor platelets by anticoagulation protocol: Yes   Plan:  Continue to hold and f/u AM INR. Monitor fo signs/symptoms of bleeding- none currently per RN.   Date                 INR                  Dose     9/27                 1.94                 5 mg     9/28  4.65  HOLD 9/29  5.76  HOLD  Luisa HartScott Zamirah Denny, PharmD Clinical Pharmacist  10/26/2015 4:24 PM

## 2015-10-26 NOTE — Progress Notes (Signed)
Chaplain rounded the unit to provide a compassionate presence and support to the patient. Patient seemed to be having difficulty with the breathing apparatus in her nose.  A nurse was called to assist her.  Jefm PettyChaplain Khamryn Calderone [phone]

## 2015-10-26 NOTE — Progress Notes (Signed)
[facility] [place] [facility]   PATIENT NAME: Laurie ClinesMargaret Larsen    MR#:  [id number]  DATE OF BIRTH:  1923/02/14  SUBJECTIVE: 82 year old female patient admitted for acute respiratory failure because of shortness of breath, heart failure.  Sob is better today.down to 40% o2.c/o back pain.  CHIEF COMPLAINT:   Chief Complaint  Patient presents with  . Weakness    REVIEW OF SYSTEMS:    Review of Systems  Constitutional: Negative for chills and fever.  HENT: Negative for hearing loss.   Eyes: Negative for blurred vision, double vision and photophobia.  Respiratory: Positive for cough and shortness of breath. Negative for hemoptysis.   Cardiovascular: Negative for chest pain, palpitations, orthopnea and leg swelling.  Gastrointestinal: Negative for abdominal pain, diarrhea and vomiting.  Genitourinary: Negative for dysuria and urgency.  Musculoskeletal: Negative for myalgias and neck pain.  Skin: Negative for rash.  Neurological: Negative for dizziness, focal weakness, seizures, weakness and headaches.  Psychiatric/Behavioral: Negative for memory loss. The patient does not have insomnia.     Nutrition:  Tolerating Diet: Tolerating PT:      DRUG ALLERGIES:   Allergies  Allergen Reactions  . Amoxicillin Other (See Comments)    Reaction:  Weakness Has patient had a PCN reaction causing immediate rash, facial/tongue/throat swelling, SOB or lightheadedness with hypotension: No Has patient had a PCN reaction causing severe rash involving mucus membranes or skin necrosis: No Has patient had a PCN reaction that required hospitalization No Has patient had a PCN reaction occurring within the last 10 years: No If all of the above answers are "NO", then may proceed with Cephalosporin use.  . Cetylpyridinium Other (See Comments)    Reaction:  Weakness   . Digoxin Other (See Comments)    Reaction:  Weakness and dehydration  . Levaquin [Levofloxacin]  Itching  . Risedronate Sodium Rash    VITALS:  Blood pressure 120/73, pulse 84, temperature 98.7 F (37.1 C), temperature source Oral, resp. rate 17, height 5\' 4"  (1.626 m), weight 54.4 kg (119 lb 14.9 oz), SpO2 96 %.  PHYSICAL EXAMINATION:   Physical Exam  GENERAL:  82 y.o.-year-old patient lying in the bed with no acute distress.  EYES: Pupils equal, round, reactive to light and accommodation. No scleral icterus. Extraocular muscles intact.  HEENT: Head atraumatic, normocephalic. Oropharynx and nasopharynx clear.  NECK:  Supple, no jugular venous distention. No thyroid enlargement, no tenderness.  LUNGS: decreased BS at bases. no wheezing, rales,rhonchi or crepitation. No use of accessory muscles of respiration.  CARDIOVASCULAR: S1, S2 normal. No murmurs, rubs, or gallops.  organomegaly or mass.  EXTREMITIES: No pedal edema, cyanosis, or clubbing.  NEUROLOGIC: Cranial nerves II through XII are intact. Muscle strength 5/5 in all extremities. Sensation intact. Gait not checked.  PSYCHIATRIC: The patient is alert and oriented x 3.  SKIN: No obvious rash, lesion, or ulcer.    LABORATORY PANEL:   CBC  Recent Labs Lab 10/26/15 0534  WBC 15.4*  HGB 13.0  HCT 37.8  PLT 155   ------------------------------------------------------------------------------------------------------------------  Chemistries   Recent Labs Lab 10/24/15 0909  10/26/15 0534  NA 134*  < > 136  K 5.6*  < > 4.6  CL 104  < > 103  CO2 17*  < > 27  GLUCOSE 153*  < > 126*  BUN 41*  < > 60*  CREATININE 1.82*  < > 1.69*  CALCIUM 8.4*  < > 8.3*  MG 2.3  --   --  AST 36  --   --   ALT 30  --   --   ALKPHOS 92  --   --   BILITOT 0.6  --   --   < > = values in this interval not displayed. ------------------------------------------------------------------------------------------------------------------  Cardiac Enzymes  Recent Labs Lab 10/24/15 0909  TROPONINI 0.03*    ------------------------------------------------------------------------------------------------------------------  RADIOLOGY:  Dg Chest 1 View  Result Date: 10/26/2015 CLINICAL DATA:  Shortness of breath.  Bronchitis. EXAM: CHEST 1 VIEW COMPARISON:  10/25/2015. FINDINGS: Cardiac pacer with lead tips in right atrium right ventricle. Cardiomegaly with bilateral pulmonary infiltrates. Bilateral pleural effusions. No pneumothorax. No acute bony abnormality . IMPRESSION: 1. Cardiac pacer with lead tips in right atrium right ventricle. Cardiomegaly with bilateral pulmonary infiltrates/edema right side greater than left. Bilateral pleural effusions, right side greater than left. Findings most consistent with congestive heart failure. Similar findings noted on prior exam. 2.  Low lung volumes. Electronically Signed   By: Maisie Fus  Register   On: 10/26/2015 12:12   Dg Chest 1 View  Result Date: 10/25/2015 CLINICAL DATA:  Shortness of breath. EXAM: CHEST 1 VIEW COMPARISON:  10/24/2015.  08/17/2015. FINDINGS: Cardiac pacer with lead tip in right atrium and right ventricle. Cardiomegaly with bilateral pulmonary infiltrates, right side greater than left. Bilateral pleural effusions, right side greater than left. Similar findings noted on prior exam . No pneumothorax. IMPRESSION: Cardiac pacer with lead tips in right atrium right ventricle. Cardiomegaly with pulmonary vascular prominence and bilateral pulmonary infiltrates consistent with pulmonary edema, right side greater than left. Similar findings noted on prior exam. Bilateral pleural effusions, right side greater left also noted. Electronically Signed   By: Maisie Fus  Register   On: 10/25/2015 07:24     ASSESSMENT AND PLAN:   Principal Problem:   Acute respiratory failure with hypoxia (HCC) Active Problems:   ATRIAL FIBRILLATION, CHRONIC   SSS (sick sinus syndrome) (HCC)   Chronic anticoagulation   Acute on chronic diastolic CHF (congestive heart  failure) (HCC)   DNR (do not resuscitate)   Septic shock (HCC)   Hyperkalemia   Medication adverse effect   Drug-induced bradycardia   Pacer at end of battery life   Acute renal failure (HCC)   Palliative care by specialist  #1 acute respiratory failure secondary to acute heart failure. Acute on chronic diastolic heart failure: Patient respiratory status improving with IV Lasix.continue IV lasix,wean off  o2 to 40%.monitor in Icu for today f or monitoring of resp status.. But because of advanced age, renal insufficiency patient prognosis is poor. #2 atrial fibrillation chronic, had bradycardia at yesterday but today bradycardia resolved. INR elevated to more than 4, hold the Coumadin, appreciate pharmacy help / Patient to atenolol, Cardizem held because of bradycardia  On admission.see by cardio #3. hyperkalemia treated. 4.Acute renal failure: Likely due to WUJ:[id number] renal function.  #5 with lactic acidosis: Possible sepsis: WBC elevated 16.2. Monitor blood cultures, urine culture data.  Discussed with son extensively, patient followed with hospice but they are not aware of hospice role l and patient also not cooperating with hospice.. I spoke with hospice NP. [name]. Son would like aggressive treatment at this time for her respiratory status as per patient wishes. #6 history of cholecystitis, cholecystostomy tube placement before status post removal. LFTs normal. Tolerating diet now 7.malignant htn;continue  Lasix,added norvasc  D/w son  All the records are reviewed and case discussed with Care Management/Social Workerr. Management plans discussed with the patient, family and they are in  agreement.  CODE STATUS: DNR  TOTAL TIME TAKING CARE OF THIS PATIENT: .   POSSIBLE D/C IN 2-3 DAYS, DEPENDING ON CLINICAL CONDITION.   Katha Hamming M.D on 10/26/2015 at 1:55 PM  Between 7am to 6pm - Pager - [phone]  After 6pm go to [email or web address] - password [email or web address]  [facility]  [place] [facility]  Office  [phone]  CC: Primary care physician; Sherlene Shams, MD

## 2015-10-26 NOTE — Progress Notes (Addendum)
Dr Lewie LoronGolan at bedside assessing pt, notified of pt's high BP, MD will reassess VS to order medicine. Family at bedside have been updated, no concerns at this time. Scott in pharmacy notified of pt's pt/inr results, will hold coumadin today.

## 2015-10-26 NOTE — Progress Notes (Addendum)
Visit made. patient seen sitting up eating breakfast, husband at bedside. Chart notes reviewed. Chest xray from 9/28 shows continued bilateral pleura effusions right greater than left, she is not a candidate per chart notes, for a thoracentesis.  She remains on [place] oxygen, saturations during visit 93-99% remained stable with conversation and eating. Blood pressure elevated, cardiology aware. Heart rate 80's-90's. WBC remain elevated at 15.4. She continues on scheduled Duoneb treatments and IV lasix. Patient reports feeling much better, her appetite is good. No discharge date at this time. Will continue to follow and update hospice team. Thank you. Dayna BarkerKaren Robertson RN, BSN, [facility], [facility] Liaison [phone]

## 2015-10-27 LAB — PROTIME-INR
INR: 3.44
PROTHROMBIN TIME: 35.5 s — AB (ref 11.4–15.2)

## 2015-10-27 LAB — BASIC METABOLIC PANEL
Anion gap: 6 (ref 5–15)
BUN: 51 mg/dL — AB (ref 6–20)
CALCIUM: 8.1 mg/dL — AB (ref 8.9–10.3)
CHLORIDE: 103 mmol/L (ref 101–111)
CO2: 29 mmol/L (ref 22–32)
CREATININE: 1.05 mg/dL — AB (ref 0.44–1.00)
GFR calc Af Amer: 53 mL/min — ABNORMAL LOW (ref >60)
GFR calc non Af Amer: 45 mL/min — ABNORMAL LOW (ref >60)
Glucose, Bld: 128 mg/dL — ABNORMAL HIGH (ref 65–99)
Potassium: 3.9 mmol/L (ref 3.5–5.1)
SODIUM: 138 mmol/L (ref 135–145)

## 2015-10-27 MED ORDER — POLYETHYLENE GLYCOL 3350 17 G PO PACK
17.0000 g | PACK | Freq: Every day | ORAL | Status: DC | PRN
  Administered 2015-10-27 – 2015-10-30 (×3): 17 g via ORAL
  Filled 2015-10-27 (×3): qty 1

## 2015-10-27 MED ORDER — POLYVINYL ALCOHOL 1.4 % OP SOLN
1.0000 [drp] | OPHTHALMIC | Status: DC | PRN
  Administered 2015-10-27: 16:00:00 1 [drp] via OPHTHALMIC
  Filled 2015-10-27: qty 15

## 2015-10-27 MED ORDER — HYPROMELLOSE (GONIOSCOPIC) 2.5 % OP SOLN
1.0000 [drp] | OPHTHALMIC | Status: DC | PRN
  Filled 2015-10-27: qty 15

## 2015-10-27 NOTE — Progress Notes (Signed)
ANTICOAGULATION CONSULT NOTE - Follow Up Consult  Pharmacy Consult for warfarin dosing Indication: atrial fibrillation  Allergies  Allergen Reactions  . Amoxicillin Other (See Comments)    Reaction:  Weakness Has patient had a PCN reaction causing immediate rash, facial/tongue/throat swelling, SOB or lightheadedness with hypotension: No Has patient had a PCN reaction causing severe rash involving mucus membranes or skin necrosis: No Has patient had a PCN reaction that required hospitalization No Has patient had a PCN reaction occurring within the last 10 years: No If all of the above answers are "NO", then may proceed with Cephalosporin use.  . Cetylpyridinium Other (See Comments)    Reaction:  Weakness   . Digoxin Other (See Comments)    Reaction:  Weakness and dehydration  . Levaquin [Levofloxacin] Itching  . Risedronate Sodium Rash    Patient Measurements: Height: 5\' 4"  (162.6 cm) Weight: 117 lb 8.1 oz (53.3 kg) IBW/kg (Calculated) : 54.7   Vital Signs: Temp: 98.6 F (37 C) (09/30 0800) Temp Source: Axillary (09/30 0800) BP: 136/74 (09/30 1000) Pulse Rate: 100 (09/30 1000)  Labs:  Recent Labs  10/25/15 0618 10/26/15 0534 10/27/15 0452  HGB 13.7 13.0  --   HCT 39.8 37.8  --   PLT 141* 155  --   LABPROT 45.1* 53.6* 35.5*  INR 4.65* 5.76* 3.44  CREATININE 2.18* 1.69* 1.05*    Estimated Creatinine Clearance: 30 mL/min (by C-G formula based on SCr of 1.05 mg/dL (H)).   Medications:  Scheduled:  . atorvastatin  20 mg Oral QHS  . chlorhexidine  15 mL Mouth Rinse BID  . docusate sodium  100 mg Oral BID  . timolol  1 drop Both Eyes BID   And  . dorzolamide  1 drop Both Eyes BID  . feeding supplement (ENSURE ENLIVE)  237 mL Oral BID WC  . furosemide  20 mg Intravenous BID  . ipratropium-albuterol  3 mL Nebulization Q6H  . latanoprost  1 drop Both Eyes QHS  . levothyroxine  112 mcg Oral Daily  . polyethylene glycol  17 g Oral QHS  . sodium chloride flush  3  mL Intravenous Q12H  . Warfarin - Pharmacist Dosing Inpatient   Does not apply q1800    Assessment: Pt and nursing home report pt is taking 5 mg daily of warfarin for afib (INR goal 2-3). Pt's pacemaker batteries have exhausted and they will not be replaced.  Goal of Therapy:  INR 2-3 Monitor platelets by anticoagulation protocol: Yes   Plan:  Continue to hold and f/u AM INR. Monitor fo signs/symptoms of bleeding.  MLS  10/27/2015 12:28 PM

## 2015-10-27 NOTE — Progress Notes (Addendum)
[facility] [place] [facility]   PATIENT NAME: Laurie Larsen    MR#:  [id number]  DATE OF BIRTH:  11-27-1923  SUBJECTIVE: 82 year old female patient admitted for acute respiratory failure because of shortness of breath, heart failure.  Sob is better today.down to 40% o2.c/o back pain.  CHIEF COMPLAINT:   Chief Complaint  Patient presents with  . Weakness    REVIEW OF SYSTEMS:    Review of Systems  Constitutional: Negative for chills and fever.  HENT: Negative for hearing loss.   Eyes: Negative for blurred vision, double vision and photophobia.  Respiratory: Positive for cough and shortness of breath. Negative for hemoptysis.   Cardiovascular: Negative for chest pain, palpitations, orthopnea and leg swelling.  Gastrointestinal: Negative for abdominal pain, diarrhea and vomiting.  Genitourinary: Negative for dysuria and urgency.  Musculoskeletal: Negative for myalgias and neck pain.  Skin: Negative for rash.  Neurological: Negative for dizziness, focal weakness, seizures, weakness and headaches.  Psychiatric/Behavioral: Negative for memory loss. The patient does not have insomnia.     Nutrition:  Tolerating Diet: Tolerating PT:      DRUG ALLERGIES:   Allergies  Allergen Reactions  . Amoxicillin Other (See Comments)    Reaction:  Weakness Has patient had a PCN reaction causing immediate rash, facial/tongue/throat swelling, SOB or lightheadedness with hypotension: No Has patient had a PCN reaction causing severe rash involving mucus membranes or skin necrosis: No Has patient had a PCN reaction that required hospitalization No Has patient had a PCN reaction occurring within the last 10 years: No If all of the above answers are "NO", then may proceed with Cephalosporin use.  . Cetylpyridinium Other (See Comments)    Reaction:  Weakness   . Digoxin Other (See Comments)    Reaction:  Weakness and dehydration  . Levaquin [Levofloxacin]  Itching  . Risedronate Sodium Rash    VITALS:  Blood pressure 136/74, pulse 100, temperature 98.6 F (37 C), temperature source Axillary, resp. rate (!) 24, height 5\' 4"  (1.626 m), weight 53.3 kg (117 lb 8.1 oz), SpO2 92 %.  PHYSICAL EXAMINATION:   Physical Exam  GENERAL:  82 y.o.-year-old patient lying in the bed with no acute distress.  EYES: Pupils equal, round, reactive to light and accommodation. No scleral icterus. Extraocular muscles intact.  HEENT: Head atraumatic, normocephalic. Oropharynx and nasopharynx clear.  NECK:  Supple, no jugular venous distention. No thyroid enlargement, no tenderness.  LUNGS: decreased BS at bases. no wheezing, rales,rhonchi or crepitation. No use of accessory muscles of respiration.  CARDIOVASCULAR: S1, S2 normal. No murmurs, rubs, or gallops.  organomegaly or mass.  EXTREMITIES: No pedal edema, cyanosis, or clubbing.  NEUROLOGIC: Cranial nerves II through XII are intact. Muscle strength 5/5 in all extremities. Sensation intact. Gait not checked.  PSYCHIATRIC: The patient is alert and oriented x 3.  SKIN: No obvious rash, lesion, or ulcer.    LABORATORY PANEL:   CBC  Recent Labs Lab 10/26/15 0534  WBC 15.4*  HGB 13.0  HCT 37.8  PLT 155   ------------------------------------------------------------------------------------------------------------------  Chemistries   Recent Labs Lab 10/24/15 0909  10/27/15 0452  NA 134*  < > 138  K 5.6*  < > 3.9  CL 104  < > 103  CO2 17*  < > 29  GLUCOSE 153*  < > 128*  BUN 41*  < > 51*  CREATININE 1.82*  < > 1.05*  CALCIUM 8.4*  < > 8.1*  MG 2.3  --   --  AST 36  --   --   ALT 30  --   --   ALKPHOS 92  --   --   BILITOT 0.6  --   --   < > = values in this interval not displayed. ------------------------------------------------------------------------------------------------------------------  Cardiac Enzymes  Recent Labs Lab 10/24/15 0909  TROPONINI 0.03*    ------------------------------------------------------------------------------------------------------------------  RADIOLOGY:  Dg Chest 1 View  Result Date: 10/26/2015 CLINICAL DATA:  Shortness of breath.  Bronchitis. EXAM: CHEST 1 VIEW COMPARISON:  10/25/2015. FINDINGS: Cardiac pacer with lead tips in right atrium right ventricle. Cardiomegaly with bilateral pulmonary infiltrates. Bilateral pleural effusions. No pneumothorax. No acute bony abnormality . IMPRESSION: 1. Cardiac pacer with lead tips in right atrium right ventricle. Cardiomegaly with bilateral pulmonary infiltrates/edema right side greater than left. Bilateral pleural effusions, right side greater than left. Findings most consistent with congestive heart failure. Similar findings noted on prior exam. 2.  Low lung volumes. Electronically Signed   By: Maisie Fus  Register   On: 10/26/2015 12:12     ASSESSMENT AND PLAN:   Principal Problem:   Acute respiratory failure with hypoxia (HCC) Active Problems:   ATRIAL FIBRILLATION, CHRONIC   SSS (sick sinus syndrome) (HCC)   Chronic anticoagulation   Acute on chronic diastolic CHF (congestive heart failure) (HCC)   DNR (do not resuscitate)   Septic shock (HCC)   Hyperkalemia   Medication adverse effect   Drug-induced bradycardia   Pacer at end of battery life   Acute renal failure (HCC)   Palliative care by specialist  #1 acute respiratory failure secondary to acute heart failure. Acute on chronic diastolic heart failure: Patient respiratory status improving with IV Lasix.continue IV lasix.Back to 3 L of oxygen.. Transfer out of ICU to regular floor with telemetry.   #2 atrial fibrillation chronic, had bradycardia at yesterday but  bradycardia resolved.  seen  By cardiology, started on Norvasc for blood pressure.   Therapeutic INR: Continue to hold the Coumadin, pharmacy to manage anticoagulation.   #3. hyperkalemia treated. 4.Acute renal failure: Likely due to CHF;improved     Discussed with son extensively, patient followed with hospice but they are not aware of hospice role l and patient also not cooperating with hospice.. I spoke with hospice NP. [name]. Son would like aggressive treatment at this time for her respiratory status as per patient wishes.  #6 history of cholecystitis, cholecystostomy tube placement before status post removal. LFTs normal. Tolerating diet now #7 physical therapy consult.   D/w son,d/w husband   All the records are reviewed and case discussed with Care Management/Social Workerr. Management plans discussed with the patient, family and they are in agreement.  CODE STATUS: DNR  TOTAL TIME TAKING CARE OF THIS PATIENT: .   POSSIBLE D/C IN 2-3 DAYS, DEPENDING ON CLINICAL CONDITION.   Katha Hamming M.D on 10/27/2015 at 12:37 PM  Between 7am to 6pm - Pager - [phone]  After 6pm go to [email or web address] - password [email or web address] [facility]  [place] [facility]  Office  [phone]  CC: Primary care physician; Sherlene Shams, MD

## 2015-10-27 NOTE — Progress Notes (Signed)
SUBJECTIVE: The patient is doing well today.  At this time, she denies chest pain, shortness of breath, or any new concerns. Breathing improved on nasal canula O2.  Expressed interest in palliative care and hospice.  [name]. atorvastatin  20 mg Oral QHS  . chlorhexidine  15 mL Mouth Rinse BID  . docusate sodium  100 mg Oral BID  . timolol  1 drop Both Eyes BID   And  . dorzolamide  1 drop Both Eyes BID  . feeding supplement (ENSURE ENLIVE)  237 mL Oral BID WC  . furosemide  20 mg Intravenous BID  . ipratropium-albuterol  3 mL Nebulization Q6H  . latanoprost  1 drop Both Eyes QHS  . levothyroxine  112 mcg Oral Daily  . polyethylene glycol  17 g Oral QHS  . sodium chloride flush  3 mL Intravenous Q12H  . Warfarin - Pharmacist Dosing Inpatient   Does not apply q1800      OBJECTIVE: Physical Exam: Vitals:   10/27/15 0500 10/27/15 0800 10/27/15 0900 10/27/15 1000  BP:  (!) 150/110 (!) 160/109 136/74  Pulse:  74 (!) 52 100  Resp:  19 (!) 21 (!) 24  Temp:  98.6 F (37 C)    TempSrc:  Axillary    SpO2:  94% 94% 92%  Weight: 117 lb 8.1 oz (53.3 kg)     Height:        Intake/Output Summary (Last 24 hours) at 10/27/15 1346 Last data filed at 10/27/15 1059  Gross per 24 hour  Intake              240 ml  Output             1900 ml  Net            -1660 ml    Telemetry reveals sinus rhythm  GEN- The patient is well appearing, alert and oriented x 3 today.   Head- normocephalic, atraumatic Eyes-  Sclera clear, conjunctiva pink Ears- hearing intact Oropharynx- clear Neck- supple, 10 cm JVD at 70 degrees Lymph- no cervical lymphadenopathy Lungs- crackles at the bases bilaterally, normal work of breathing Heart- iRRR, no murmurs, rubs or gallops, PMI not laterally displaced GI- soft, NT, ND, + BS Extremities- no clubbing, cyanosis, or edema Skin- no rash or lesion Psych- euthymic mood, full affect Neuro- strength and sensation are intact  LABS: Basic Metabolic  Panel:  Recent Labs  10/26/15 0534 10/27/15 0452  NA 136 138  K 4.6 3.9  CL 103 103  CO2 27 29  GLUCOSE 126* 128*  BUN 60* 51*  CREATININE 1.69* 1.05*  CALCIUM 8.3* 8.1*   Liver Function Tests: No results for input(s): AST, ALT, ALKPHOS, BILITOT, PROT, ALBUMIN in the last 72 hours. No results for input(s): LIPASE, AMYLASE in the last 72 hours. CBC:  Recent Labs  10/25/15 0618 10/26/15 0534  WBC 16.2* 15.4*  HGB 13.7 13.0  HCT 39.8 37.8  MCV 92.3 91.9  PLT 141* 155   Cardiac Enzymes: No results for input(s): CKTOTAL, CKMB, CKMBINDEX, TROPONINI in the last 72 hours. BNP: Invalid input(s): POCBNP D-Dimer: No results for input(s): DDIMER in the last 72 hours. Hemoglobin A1C: No results for input(s): HGBA1C in the last 72 hours. Fasting Lipid Panel: No results for input(s): CHOL, HDL, LDLCALC, TRIG, CHOLHDL, LDLDIRECT in the last 72 hours. Thyroid Function Tests: No results for input(s): TSH, T4TOTAL, T3FREE, THYROIDAB in the last 72 hours.  Invalid input(s): FREET3 Anemia Panel: No results  for input(s): VITAMINB12, FOLATE, FERRITIN, TIBC, IRON, RETICCTPCT in the last 72 hours.  RADIOLOGY: Dg Chest 1 View  Result Date: 10/26/2015 CLINICAL DATA:  Shortness of breath.  Bronchitis. EXAM: CHEST 1 VIEW COMPARISON:  10/25/2015. FINDINGS: Cardiac pacer with lead tips in right atrium right ventricle. Cardiomegaly with bilateral pulmonary infiltrates. Bilateral pleural effusions. No pneumothorax. No acute bony abnormality . IMPRESSION: 1. Cardiac pacer with lead tips in right atrium right ventricle. Cardiomegaly with bilateral pulmonary infiltrates/edema right side greater than left. Bilateral pleural effusions, right side greater than left. Findings most consistent with congestive heart failure. Similar findings noted on prior exam. 2.  Low lung volumes. Electronically Signed   By: Maisie Fus  Register   On: 10/26/2015 12:12   Dg Chest 1 View  Result Date: 10/25/2015 CLINICAL  DATA:  Shortness of breath. EXAM: CHEST 1 VIEW COMPARISON:  10/24/2015.  08/17/2015. FINDINGS: Cardiac pacer with lead tip in right atrium and right ventricle. Cardiomegaly with bilateral pulmonary infiltrates, right side greater than left. Bilateral pleural effusions, right side greater than left. Similar findings noted on prior exam . No pneumothorax. IMPRESSION: Cardiac pacer with lead tips in right atrium right ventricle. Cardiomegaly with pulmonary vascular prominence and bilateral pulmonary infiltrates consistent with pulmonary edema, right side greater than left. Similar findings noted on prior exam. Bilateral pleural effusions, right side greater left also noted. Electronically Signed   By: Maisie Fus  Register   On: 10/25/2015 07:24   Dg Chest Portable 1 View  Result Date: 10/24/2015 CLINICAL DATA:  Shortness of Breath EXAM: PORTABLE CHEST 1 VIEW COMPARISON:  08/17/2015 FINDINGS: Cardiomegaly. Central mild vascular congestion and mild infrahilar interstitial prominence suspicious for mild pulmonary edema. Bilateral small pleural effusion with bilateral basilar atelectasis or infiltrate right greater than left. Double lead cardiac pacemaker is unchanged in position. IMPRESSION: Central mild vascular congestion and mild infrahilar interstitial prominence suspicious for mild pulmonary edema. Bilateral small pleural effusion with bilateral basilar atelectasis or infiltrate right greater than left. Electronically Signed   By: Natasha Mead M.D.   On: 10/24/2015 09:45   Ir Cholangiogram Existing Tube  Result Date: 10/19/2015 INDICATION: Chronic cholecystitis, chronic indwelling cholecystostomy which has been capped for several months and well tolerated. EXAM: CHOLANGIOGRAM VIA EXISTING CATHETER MEDICATIONS: None. ANESTHESIA/SEDATION: None. FLUOROSCOPY TIME:  Fluoroscopy Time: 0 minutes 36 seconds (5 mGy). COMPLICATIONS: None immediate. PROCEDURE: Informed written consent was obtained from the patient after a  thorough discussion of the procedural risks, benefits and alternatives. All questions were addressed. Maximal Sterile Barrier Technique was utilized including caps, mask, sterile gowns, sterile gloves, sterile drape, hand hygiene and skin antiseptic. A timeout was performed prior to the initiation of the procedure. Under sterile conditions, the existing cholecystostomy was injected with contrast for cholangiogram. This demonstrates a collapsed gallbladder. Cystic duct, common hepatic duct, common bile duct are patent. Contrast easily drains into the duodenum. Gallbladder was decompressed by syringe aspiration. Cholecystostomy was cut and removed without difficulty. Sterile dressing applied. No immediate complication. Findings discussed with the patient and her daughter. IMPRESSION: Patent biliary system. Successful cholecystostomy removal. Electronically Signed   By: Judie Petit.  Shick M.D.   On: 10/19/2015 14:01    ASSESSMENT AND PLAN:  Principal Problem:   Acute respiratory failure with hypoxia (HCC) Active Problems:   ATRIAL FIBRILLATION, CHRONIC   SSS (sick sinus syndrome) (HCC)   Chronic anticoagulation   Acute on chronic diastolic CHF (congestive heart failure) (HCC)   DNR (do not resuscitate)   Septic shock (HCC)   Hyperkalemia  Medication adverse effect   Drug-induced bradycardia   Pacer at end of battery life   Acute renal failure (HCC)   Palliative care by specialist ----Bradycardia HR 100s currently.  Goal between 90-110 resting.  ---Atrial fibrillation, chronic Rate relatively well-controlled in the 80s We'll not add any rate controlling medications Agree with anticoagulation  This patients CHA2DS2-VASc Score and unadjusted Ischemic Stroke Rate (% per year) is equal to 4.8 % stroke rate/year from a score of 4  Above score calculated as 1 point each if present [CHF, HTN, DM, Vascular=MI/PAD/Aortic Plaque, Age if 65-74, or Female] Above score calculated as 2 points each if present  [Age > 75, or Stroke/TIA/TE]  ----Acute on chronic diastolic CHF Slow improvement with gentle diuresis Fluid retention on arrival likely secondary to bradycardia Symptoms should improve with gentle diuresis Continue diuresis.    -----Depression Tearful state this morning, try to provide some support  Turner Baillie Jorja Loa, MD 10/27/2015 1:46 PM

## 2015-10-28 LAB — BASIC METABOLIC PANEL
ANION GAP: 8 (ref 5–15)
BUN: 45 mg/dL — AB (ref 6–20)
CHLORIDE: 100 mmol/L — AB (ref 101–111)
CO2: 30 mmol/L (ref 22–32)
Calcium: 8.2 mg/dL — ABNORMAL LOW (ref 8.9–10.3)
Creatinine, Ser: 0.91 mg/dL (ref 0.44–1.00)
GFR calc Af Amer: >60 mL/min (ref >60)
GFR, EST NON AFRICAN AMERICAN: 54 mL/min — AB (ref >60)
GLUCOSE: 90 mg/dL (ref 65–99)
POTASSIUM: 3.9 mmol/L (ref 3.5–5.1)
Sodium: 138 mmol/L (ref 135–145)

## 2015-10-28 LAB — PROTIME-INR
INR: 1.86
Prothrombin Time: 21.7 seconds — ABNORMAL HIGH (ref 11.4–15.2)

## 2015-10-28 MED ORDER — WARFARIN SODIUM 4 MG PO TABS
4.0000 mg | ORAL_TABLET | Freq: Once | ORAL | Status: AC
  Administered 2015-10-28: 4 mg via ORAL
  Filled 2015-10-28: qty 1

## 2015-10-28 MED ORDER — IPRATROPIUM-ALBUTEROL 0.5-2.5 (3) MG/3ML IN SOLN
3.0000 mL | Freq: Three times a day (TID) | RESPIRATORY_TRACT | Status: DC
  Administered 2015-10-28 – 2015-10-30 (×7): 3 mL via RESPIRATORY_TRACT
  Filled 2015-10-28 (×7): qty 3

## 2015-10-28 NOTE — Progress Notes (Signed)
[facility] [place] [facility]   PATIENT NAME: Laurie Larsen    MR#:  [id number]  DATE OF BIRTH:  Jun 25, 1923  SUBJECTIVE: 82 year old female patient admitted for acute respiratory failure because of shortness of breath, heart failure.  Sob is better today.down to 40% o2.c/o back pain.  complains of constipation. Had a small BM yesterday.   CHIEF COMPLAINT:   Chief Complaint  Patient presents with  . Weakness    REVIEW OF SYSTEMS:    Review of Systems  Constitutional: Negative for chills and fever.  HENT: Negative for hearing loss.   Eyes: Negative for blurred vision, double vision and photophobia.  Respiratory: Positive for cough and shortness of breath. Negative for hemoptysis.   Cardiovascular: Negative for chest pain, palpitations, orthopnea and leg swelling.  Gastrointestinal: Negative for abdominal pain, diarrhea and vomiting.  Genitourinary: Negative for dysuria and urgency.  Musculoskeletal: Negative for myalgias and neck pain.  Skin: Negative for rash.  Neurological: Negative for dizziness, focal weakness, seizures, weakness and headaches.  Psychiatric/Behavioral: Negative for memory loss. The patient does not have insomnia.     Nutrition:  Tolerating Diet: Tolerating PT:      DRUG ALLERGIES:   Allergies  Allergen Reactions  . Amoxicillin Other (See Comments)    Reaction:  Weakness Has patient had a PCN reaction causing immediate rash, facial/tongue/throat swelling, SOB or lightheadedness with hypotension: No Has patient had a PCN reaction causing severe rash involving mucus membranes or skin necrosis: No Has patient had a PCN reaction that required hospitalization No Has patient had a PCN reaction occurring within the last 10 years: No If all of the above answers are "NO", then may proceed with Cephalosporin use.  . Cetylpyridinium Other (See Comments)    Reaction:  Weakness   . Digoxin Other (See Comments)    Reaction:   Weakness and dehydration  . Levaquin [Levofloxacin] Itching  . Risedronate Sodium Rash    VITALS:  Blood pressure 134/82, pulse 97, temperature 97.8 F (36.6 C), temperature source Oral, resp. rate 17, height 5\' 4"  (1.626 m), weight 50.7 kg (111 lb 11.2 oz), SpO2 94 %.  PHYSICAL EXAMINATION:   Physical Exam  GENERAL:  82 y.o.-year-old patient lying in the bed with no acute distress.  EYES: Pupils equal, round, reactive to light and accommodation. No scleral icterus. Extraocular muscles intact.  HEENT: Head atraumatic, normocephalic. Oropharynx and nasopharynx clear.  NECK:  Supple, no jugular venous distention. No thyroid enlargement, no tenderness.  LUNGS: decreased BS at bases. no wheezing, rales,rhonchi or crepitation. No use of accessory muscles of respiration.  CARDIOVASCULAR: S1, S2 normal. No murmurs, rubs, or gallops.  organomegaly or mass.  EXTREMITIES: No pedal edema, cyanosis, or clubbing.  NEUROLOGIC: Cranial nerves II through XII are intact. Muscle strength 5/5 in all extremities. Sensation intact. Gait not checked.  PSYCHIATRIC: The patient is alert and oriented x 3.  SKIN: No obvious rash, lesion, or ulcer.    LABORATORY PANEL:   CBC  Recent Labs Lab 10/26/15 0534  WBC 15.4*  HGB 13.0  HCT 37.8  PLT 155   ------------------------------------------------------------------------------------------------------------------  Chemistries   Recent Labs Lab 10/24/15 0909  10/28/15 0433  NA 134*  < > 138  K 5.6*  < > 3.9  CL 104  < > 100*  CO2 17*  < > 30  GLUCOSE 153*  < > 90  BUN 41*  < > 45*  CREATININE 1.82*  < > 0.91  CALCIUM 8.4*  < >  8.2*  MG 2.3  --   --   AST 36  --   --   ALT 30  --   --   ALKPHOS 92  --   --   BILITOT 0.6  --   --   < > = values in this interval not displayed. ------------------------------------------------------------------------------------------------------------------  Cardiac Enzymes  Recent Labs Lab 10/24/15 0909   TROPONINI 0.03*   ------------------------------------------------------------------------------------------------------------------  RADIOLOGY:  Dg Chest 1 View  Result Date: 10/26/2015 CLINICAL DATA:  Shortness of breath.  Bronchitis. EXAM: CHEST 1 VIEW COMPARISON:  10/25/2015. FINDINGS: Cardiac pacer with lead tips in right atrium right ventricle. Cardiomegaly with bilateral pulmonary infiltrates. Bilateral pleural effusions. No pneumothorax. No acute bony abnormality . IMPRESSION: 1. Cardiac pacer with lead tips in right atrium right ventricle. Cardiomegaly with bilateral pulmonary infiltrates/edema right side greater than left. Bilateral pleural effusions, right side greater than left. Findings most consistent with congestive heart failure. Similar findings noted on prior exam. 2.  Low lung volumes. Electronically Signed   By: Maisie Fushomas  Register   On: 10/26/2015 12:12     ASSESSMENT AND PLAN:   Principal Problem:   Acute respiratory failure with hypoxia (HCC) Active Problems:   ATRIAL FIBRILLATION, CHRONIC   SSS (sick sinus syndrome) (HCC)   Chronic anticoagulation   Acute on chronic diastolic CHF (congestive heart failure) (HCC)   DNR (do not resuscitate)   Septic shock (HCC)   Hyperkalemia   Medication adverse effect   Drug-induced bradycardia   Pacer at end of battery life   Acute renal failure (HCC)   Palliative care by specialist  #1 acute respiratory failure secondary to acute heart failure. Acute on chronic diastolic heart failure: Patient respiratory status improving with IV Lasix.continue IV lasix.Back to 3 L of oxygen.Kennon Portela. likley d/c  Home am   #2 atrial fibrillation chronic, had bradycardia at yesterday but  bradycardia resolved.  seen  By cardiology, started on Norvasc for blood pressure.   Therapeutic INR: Continue to hold the Coumadin, pharmacy to manage anticoagulation.   #3. hyperkalemia treated. 4.Acute renal failure: Likely due to CHF;improved     Discussed with son extensively, patient followed with hospice but they are not aware of hospice role l and patient also not cooperating with hospice.. I spoke with hospice NP. [name]. Son would like aggressive treatment at this time for her respiratory status as per patient wishes.  #6 history of cholecystitis, cholecystostomy tube placement before status post removal. LFTs normal. Tolerating diet now #7 physical therapy consult. #8 constipation: Continue stool softeners.  D/w son,d/w husband   All the records are reviewed and case discussed with Care Management/Social Workerr. Management plans discussed with the patient, family and they are in agreement.  CODE STATUS: DNR  TOTAL TIME TAKING CARE OF THIS PATIENT: 35minutes.   POSSIBLE D/C IN 2-3 DAYS, DEPENDING ON CLINICAL CONDITION.   Katha HammingKONIDENA,Georga Stys M.D on 10/28/2015 at 10:42 AM  Between 7am to 6pm - Pager - [phone]  After 6pm go to [email or web address] - password [email or web address] [facility]  [facility]  Office  [phone]  CC: Primary care physician; Sherlene ShamsULLO, TERESA L, MD

## 2015-10-28 NOTE — Plan of Care (Signed)
Problem: Bowel/Gastric: Goal: Will not experience complications related to bowel motility Outcome: Progressing Patient with medium soft bowel movement this shift.

## 2015-10-28 NOTE — Progress Notes (Signed)
ANTICOAGULATION CONSULT NOTE - Follow Up Consult  Pharmacy Consult for warfarin dosing Indication: atrial fibrillation  Allergies  Allergen Reactions  . Amoxicillin Other (See Comments)    Reaction:  Weakness Has patient had a PCN reaction causing immediate rash, facial/tongue/throat swelling, SOB or lightheadedness with hypotension: No Has patient had a PCN reaction causing severe rash involving mucus membranes or skin necrosis: No Has patient had a PCN reaction that required hospitalization No Has patient had a PCN reaction occurring within the last 10 years: No If all of the above answers are "NO", then may proceed with Cephalosporin use.  . Cetylpyridinium Other (See Comments)    Reaction:  Weakness   . Digoxin Other (See Comments)    Reaction:  Weakness and dehydration  . Levaquin [Levofloxacin] Itching  . Risedronate Sodium Rash    Patient Measurements: Height: 5\' 4"  (162.6 cm) Weight: 111 lb 11.2 oz (50.7 kg) IBW/kg (Calculated) : 54.7   Vital Signs: Temp: 97.8 F (36.6 C) (10/01 0443) Temp Source: Oral (10/01 0443) BP: 134/82 (10/01 0443) Pulse Rate: 97 (10/01 0443)  Labs:  Recent Labs  10/26/15 0534 10/27/15 0452 10/28/15 0433  HGB 13.0  --   --   HCT 37.8  --   --   PLT 155  --   --   LABPROT 53.6* 35.5* 21.7*  INR 5.76* 3.44 1.86  CREATININE 1.69* 1.05* 0.91    Estimated Creatinine Clearance: 32.9 mL/min (by C-G formula based on SCr of 0.91 mg/dL).   Medications:  Scheduled:  . atorvastatin  20 mg Oral QHS  . chlorhexidine  15 mL Mouth Rinse BID  . docusate sodium  100 mg Oral BID  . timolol  1 drop Both Eyes BID   And  . dorzolamide  1 drop Both Eyes BID  . feeding supplement (ENSURE ENLIVE)  237 mL Oral BID WC  . furosemide  20 mg Intravenous BID  . ipratropium-albuterol  3 mL Nebulization TID  . latanoprost  1 drop Both Eyes QHS  . levothyroxine  112 mcg Oral Daily  . sodium chloride flush  3 mL Intravenous Q12H  . warfarin  4 mg Oral  ONCE-1800  . Warfarin - Pharmacist Dosing Inpatient   Does not apply q1800    Assessment: Pt and nursing home report pt is taking 5 mg daily of warfarin for afib (INR goal 2-3). Pt's pacemaker batteries have exhausted and they will not be replaced.  Goal of Therapy:  INR 2-3 Monitor platelets by anticoagulation protocol: Yes   Plan:  Patient's INR now subtherapeutic. Will resume patient on 80% of previous nursing home dose, 4mg  q1800. Will obtain INR with am labs.    Pharmacy will continue to monitor and adjust per consult.   MLS  10/28/2015 11:17 AM

## 2015-10-28 NOTE — Progress Notes (Signed)
PT Cancellation Note  Patient Details Name: Cecil CobbsMargaret N Sassone MRN: [id number] DOB: [birth year]   Cancelled Treatment:    Reason Eval/Treat Not Completed: Fatigue/lethargy limiting ability to participate (asked PT to try later).  Will check back as time and pt allow.   Ivar DrapeStout, Zamariya Neal E 10/28/2015, 12:54 PM    Samul Dadauth Yoltzin Ransom, PT MS Acute Rehab Dept. Number: [facility] [id number] [phone] and [facility] [phone]

## 2015-10-29 DIAGNOSIS — R001 Bradycardia, unspecified: Secondary | ICD-10-CM

## 2015-10-29 LAB — BASIC METABOLIC PANEL
Anion gap: 8 (ref 5–15)
BUN: 45 mg/dL — AB (ref 6–20)
CHLORIDE: 99 mmol/L — AB (ref 101–111)
CO2: 30 mmol/L (ref 22–32)
CREATININE: 0.82 mg/dL (ref 0.44–1.00)
Calcium: 8.2 mg/dL — ABNORMAL LOW (ref 8.9–10.3)
GFR calc Af Amer: >60 mL/min (ref >60)
GFR calc non Af Amer: >60 mL/min (ref >60)
GLUCOSE: 105 mg/dL — AB (ref 65–99)
POTASSIUM: 3.5 mmol/L (ref 3.5–5.1)
SODIUM: 137 mmol/L (ref 135–145)

## 2015-10-29 LAB — PROTIME-INR
INR: 1.49
PROTHROMBIN TIME: 18.2 s — AB (ref 11.4–15.2)

## 2015-10-29 MED ORDER — WARFARIN SODIUM 4 MG PO TABS
4.0000 mg | ORAL_TABLET | Freq: Once | ORAL | Status: AC
  Administered 2015-10-29: 18:00:00 4 mg via ORAL
  Filled 2015-10-29: qty 1

## 2015-10-29 MED ORDER — POTASSIUM CHLORIDE CRYS ER 20 MEQ PO TBCR
20.0000 meq | EXTENDED_RELEASE_TABLET | Freq: Two times a day (BID) | ORAL | Status: DC
  Administered 2015-10-29 – 2015-10-30 (×3): 20 meq via ORAL
  Filled 2015-10-29 (×3): qty 1

## 2015-10-29 NOTE — Progress Notes (Signed)
Visit made. Patient seen sitting up in bed alert, appears very tired. Breathing treatment in place, she reports being ready to "go to her [facility] home". Husband at bedside. Per chart note review she has had one dose of hydrocodone 10-325 today for pain. She remains on nasal cannula at 2 liters,  IV lasix 20 mg BID and scheduled duonebs. She walked with PT this morning, PT is recommending SNF for rehab. Patient at this time is not interested. Palliative Care to meet with patient's family tomorrow morning. Will continue to follow and update hospice team. Thank you. Dayna BarkerKaren Robertson RN, BSN, [facility] [place], [facility] [phone] c

## 2015-10-29 NOTE — Evaluation (Signed)
Physical Therapy Evaluation Patient Details Name: Laurie CobbsMargaret N Larsen MRN: [id number] DOB: [birth year] Today's Date: 10/29/2015   History of Present Illness  Pt admitted for acute respiratory failure. Pt with complaints of weakness and SOB symptoms. Pt with history of CHF, COPD, and afib. Pt also with pacemaker with low battery, no planned cardiac interventions secondary to medical status at this time. Pt currently receives home hospice.   Clinical Impression  Pt is a pleasant 82 year old female who was admitted for respiratory failure. Pt performs bed mobility, transfers, and ambulation with min assist. Increased effort noted with all mobility and fatigue noted with multiple rest breaks required during gait trial. Pt with decreased safety awareness, needed PT to request Pt return back to room. Pt demonstrates deficits with strength/mobility/endurance. Would benefit from skilled PT to address above deficits and promote optimal return to PLOF; recommend transition to STR upon discharge from acute hospitalization.       Follow Up Recommendations SNF    Equipment Recommendations       Recommendations for Other Services       Precautions / Restrictions Precautions Precautions: Fall Restrictions Weight Bearing Restrictions: No      Mobility  Bed Mobility Overal bed mobility: Needs Assistance Bed Mobility: Supine to Sit     Supine to sit: Min assist     General bed mobility comments: assist for sequencing and assist scooting out towards EOB. Once seated, able to sit with superviison.  Transfers Overall transfer level: Needs assistance Equipment used: Rolling walker (2 wheeled) Transfers: Sit to/from Stand Sit to Stand: Min assist         General transfer comment: needs heavy cues and encouragement for transfer to standing. Once standing, pt with very kyphotic posture, unable to hold head upright.  Ambulation/Gait Ambulation/Gait assistance: Min assist Ambulation Distance  (Feet): 80 Feet Assistive device: Rolling walker (2 wheeled) Gait Pattern/deviations: Step-through pattern   Gait velocity interpretation: Below normal speed for age/gender General Gait Details: Pt started with reciprocal gait pattern, however fatigued quickly and then only able to perform step to gait pattern and required multiple standing rest breaks and slight buckling noted. All ambulation performed on room air with sats decreasing to 87% and improving with seated rest break to 91% and 2L of O2 applied.  Stairs            Wheelchair Mobility    Modified Rankin (Stroke Patients Only)       Balance Overall balance assessment: Needs assistance Sitting-balance support: Feet supported Sitting balance-Leahy Scale: Fair     Standing balance support: Bilateral upper extremity supported Standing balance-Leahy Scale: Poor                               Pertinent Vitals/Pain Pain Assessment: No/denies pain    Home Living Family/patient expects to be discharged to:: Private residence Living Arrangements: Spouse/significant other Available Help at Discharge: Family;Available 24 hours/day Type of Home: Independent living facility Home Access: Level entry     Home Layout: One level Home Equipment: Walker - 2 wheels;Cane - single point;Walker - 4 wheels      Prior Function Level of Independence: Independent with assistive device(s)         Comments: pt uses rollater to ambulate to dining hall     Hand Dominance        Extremity/Trunk Assessment   Upper Extremity Assessment: Generalized weakness (grossly 3+/5)  Lower Extremity Assessment: Generalized weakness (grossly 3-/5)         Communication   Communication: No difficulties  Cognition Arousal/Alertness: Awake/alert Behavior During Therapy: WFL for tasks assessed/performed Overall Cognitive Status: Within Functional Limits for tasks assessed                       General Comments      Exercises     Assessment/Plan    PT Assessment Patient needs continued PT services  PT Problem List Decreased strength;Decreased balance;Decreased mobility          PT Treatment Interventions Gait training;DME instruction;Therapeutic exercise    PT Goals (Current goals can be found in the Care Plan section)  Acute Rehab PT Goals Patient Stated Goal: to die and go to heaven PT Goal Formulation: With patient Time For Goal Achievement: 11/12/15 Potential to Achieve Goals: Fair    Frequency Min 2X/week   Barriers to discharge        Co-evaluation               End of Session Equipment Utilized During Treatment: Gait belt Activity Tolerance: Patient limited by fatigue Patient left: in bed;with bed alarm set Nurse Communication: Mobility status         Time: 1610-9604 PT Time Calculation (min) (ACUTE ONLY): 18 min   Charges:   PT Evaluation $PT Eval Moderate Complexity: 1 Procedure     PT G Codes:        Clint Strupp 11/14/15, 11:42 AM Elizabeth Palau, PT, DPT [phone]

## 2015-10-29 NOTE — Clinical Social Work Note (Signed)
Clinical Social Work Assessment  Patient Details  Name: Laurie Larsen MRN: [id number] Date of Birth: Sep 24, 1923  Date of referral:  10/29/15               Reason for consult:  Discharge Planning                Permission sought to share information with:  Family Supports Permission granted to share information::  Yes, Verbal Permission Granted  Name::        Agency::     Relationship::   (Husband& Dominica Severin- Son)  Contact Information:     Housing/Transportation Living arrangements for the past 2 months:  Apartment Source of Information:  Patient, Spouse Patient Interpreter Needed:  None Criminal Activity/Legal Involvement Pertinent to Current Situation/Hospitalization:  No - Comment as needed Significant Relationships:  Adult Children, Other Family Members, Spouse Lives with:  Spouse Do you feel safe going back to the place where you live?  Yes Need for family participation in patient care:  Yes (Comment) (Gary-Son)  Care giving concerns:  PT recommends that patient would benefit from SNF placement at discharge.    Social Worker assessment / plan:  CSW met with patient and her husband at beside. Introduced herself and her role. Per patient she lives at [facility]. Stated that she has [employer] services in her home. Per patient she's not interested in SNF placement. Reported that she's been to [facility] before and she felt it was a waste of her time. Reported that she would like to go home. Patient granted CSW verbal permission to speak to [facility] Liaison and to her son- Dominica Severin about discharge plans.   CSW spoke to [place] Liaison. Per Santiago Glad patient is open with them. Reported that patient isn't interested in STR. Stated that Palliative Care is to meet with patient and her family tomorrow to discuss goals of care.   CSW left voicemail for patient's son Dominica Severin. Awaiting phone call back. CSW will  continue to follow and assist.   Employment status:  Retired Forensic scientist:  Medicare PT Recommendations:  [place] / Referral to community resources:  [place]  Patient/Family's Response to care:  Patient isn't interested in [place] at this time.   Patient/Family's Understanding of and Emotional Response to Diagnosis, Current Treatment, and Prognosis:  Patient stated she understood and thanked CSW for assistance.   Emotional Assessment Appearance:  Appears stated age Attitude/Demeanor/Rapport:   (None) Affect (typically observed):  Calm, Pleasant Orientation:  Oriented to Self, Oriented to Place, Oriented to  Time, Oriented to Situation Alcohol / Substance use:  Not Applicable Psych involvement (Current and /or in the community):  No (Comment)  Discharge Needs  Concerns to be addressed:  Discharge Planning Concerns Readmission within the last 30 days:  No Current discharge risk:  Chronically ill Barriers to Discharge:  Continued Medical Work up   [employer], LCSW 10/29/2015, 4:10 PM

## 2015-10-29 NOTE — Progress Notes (Signed)
Patient: Laurie Larsen / Admit Date: 10/24/2015 / Date of Encounter: 10/29/2015, 8:09 AM   Subjective: SOB improved. Incontinent-->difficulty documenting UOP. Rate controlled in the 80-90's bpm. Labs stable.   Review of Systems: Review of Systems  Constitutional: Positive for malaise/fatigue and weight loss. Negative for chills, diaphoresis and fever.  HENT: Negative for congestion.   Eyes: Negative for discharge and redness.  Respiratory: Positive for shortness of breath. Negative for cough, sputum production and wheezing.   Cardiovascular: Negative for chest pain, palpitations, orthopnea, claudication, leg swelling and PND.  Gastrointestinal: Negative for abdominal pain, heartburn, nausea and vomiting.  Musculoskeletal: Negative for falls and myalgias.  Skin: Negative for rash.  Neurological: Positive for weakness. Negative for dizziness, tingling, tremors, sensory change, speech change, focal weakness and loss of consciousness.  Endo/Heme/Allergies: Does not bruise/bleed easily.  Psychiatric/Behavioral: Negative for substance abuse. The patient is not nervous/anxious.   All other systems reviewed and are negative.   Objective: Telemetry: Afib, 80's to 90's bpm Physical Exam: Blood pressure (!) 154/79, pulse 89, temperature 98.3 F (36.8 C), temperature source Oral, resp. rate (!) 22, height 5\' 4"  (1.626 m), weight 107 lb 8 oz (48.8 kg), SpO2 93 %. Body mass index is 18.45 kg/m. General: Frail appearing, in no acute distress. Head: Normocephalic, atraumatic, sclera non-icteric, no xanthomas, nares are without discharge. Neck: Negative for carotid bruits. JVP not elevated. Lungs: Bilateral crackles. Breathing is unlabored. On [place].  Heart: RRR S1 S2 without murmurs, rubs, or gallops.  Abdomen: Soft, non-tender, non-distended with normoactive bowel sounds. No rebound/guarding. Extremities: No clubbing or cyanosis. No edema. Distal pedal pulses are 2+ and equal  bilaterally. Neuro: Alert and oriented X 3. Moves all extremities spontaneously. Psych:  Responds to questions appropriately with a normal affect.   Intake/Output Summary (Last 24 hours) at 10/29/15 0809 Last data filed at 10/29/15 0730  Gross per 24 hour  Intake              120 ml  Output              250 ml  Net             -130 ml    Inpatient Medications:  . atorvastatin  20 mg Oral QHS  . chlorhexidine  15 mL Mouth Rinse BID  . docusate sodium  100 mg Oral BID  . timolol  1 drop Both Eyes BID   And  . dorzolamide  1 drop Both Eyes BID  . feeding supplement (ENSURE ENLIVE)  237 mL Oral BID WC  . furosemide  20 mg Intravenous BID  . ipratropium-albuterol  3 mL Nebulization TID  . latanoprost  1 drop Both Eyes QHS  . levothyroxine  112 mcg Oral Daily  . sodium chloride flush  3 mL Intravenous Q12H  . warfarin  4 mg Oral ONCE-1800  . Warfarin - Pharmacist Dosing Inpatient   Does not apply q1800   Infusions:    Labs:  Recent Labs  10/28/15 0433 10/29/15 0412  NA 138 137  K 3.9 3.5  CL 100* 99*  CO2 30 30  GLUCOSE 90 105*  BUN 45* 45*  CREATININE 0.91 0.82  CALCIUM 8.2* 8.2*   No results for input(s): AST, ALT, ALKPHOS, BILITOT, PROT, ALBUMIN in the last 72 hours. No results for input(s): WBC, NEUTROABS, HGB, HCT, MCV, PLT in the last 72 hours. No results for input(s): CKTOTAL, CKMB, TROPONINI in the last 72 hours. Invalid input(s): POCBNP No results  for input(s): HGBA1C in the last 72 hours.   Weights: Filed Weights   10/27/15 0500 10/28/15 0500 10/29/15 0427  Weight: 117 lb 8.1 oz (53.3 kg) 111 lb 11.2 oz (50.7 kg) 107 lb 8 oz (48.8 kg)     Radiology/Studies:  Dg Chest 1 View  Result Date: 10/26/2015 CLINICAL DATA:  Shortness of breath.  Bronchitis. EXAM: CHEST 1 VIEW COMPARISON:  10/25/2015. FINDINGS: Cardiac pacer with lead tips in right atrium right ventricle. Cardiomegaly with bilateral pulmonary infiltrates. Bilateral pleural effusions. No  pneumothorax. No acute bony abnormality . IMPRESSION: 1. Cardiac pacer with lead tips in right atrium right ventricle. Cardiomegaly with bilateral pulmonary infiltrates/edema right side greater than left. Bilateral pleural effusions, right side greater than left. Findings most consistent with congestive heart failure. Similar findings noted on prior exam. 2.  Low lung volumes. Electronically Signed   By: Maisie Fus  Register   On: 10/26/2015 12:12   Dg Chest 1 View  Result Date: 10/25/2015 CLINICAL DATA:  Shortness of breath. EXAM: CHEST 1 VIEW COMPARISON:  10/24/2015.  08/17/2015. FINDINGS: Cardiac pacer with lead tip in right atrium and right ventricle. Cardiomegaly with bilateral pulmonary infiltrates, right side greater than left. Bilateral pleural effusions, right side greater than left. Similar findings noted on prior exam . No pneumothorax. IMPRESSION: Cardiac pacer with lead tips in right atrium right ventricle. Cardiomegaly with pulmonary vascular prominence and bilateral pulmonary infiltrates consistent with pulmonary edema, right side greater than left. Similar findings noted on prior exam. Bilateral pleural effusions, right side greater left also noted. Electronically Signed   By: Maisie Fus  Register   On: 10/25/2015 07:24   Dg Chest Portable 1 View  Result Date: 10/24/2015 CLINICAL DATA:  Shortness of Breath EXAM: PORTABLE CHEST 1 VIEW COMPARISON:  08/17/2015 FINDINGS: Cardiomegaly. Central mild vascular congestion and mild infrahilar interstitial prominence suspicious for mild pulmonary edema. Bilateral small pleural effusion with bilateral basilar atelectasis or infiltrate right greater than left. Double lead cardiac pacemaker is unchanged in position. IMPRESSION: Central mild vascular congestion and mild infrahilar interstitial prominence suspicious for mild pulmonary edema. Bilateral small pleural effusion with bilateral basilar atelectasis or infiltrate right greater than left. Electronically  Signed   By: Natasha Mead M.D.   On: 10/24/2015 09:45   Ir Cholangiogram Existing Tube  Result Date: 10/19/2015 INDICATION: Chronic cholecystitis, chronic indwelling cholecystostomy which has been capped for several months and well tolerated. EXAM: CHOLANGIOGRAM VIA EXISTING CATHETER MEDICATIONS: None. ANESTHESIA/SEDATION: None. FLUOROSCOPY TIME:  Fluoroscopy Time: 0 minutes 36 seconds (5 mGy). COMPLICATIONS: None immediate. PROCEDURE: Informed written consent was obtained from the patient after a thorough discussion of the procedural risks, benefits and alternatives. All questions were addressed. Maximal Sterile Barrier Technique was utilized including caps, mask, sterile gowns, sterile gloves, sterile drape, hand hygiene and skin antiseptic. A timeout was performed prior to the initiation of the procedure. Under sterile conditions, the existing cholecystostomy was injected with contrast for cholangiogram. This demonstrates a collapsed gallbladder. Cystic duct, common hepatic duct, common bile duct are patent. Contrast easily drains into the duodenum. Gallbladder was decompressed by syringe aspiration. Cholecystostomy was cut and removed without difficulty. Sterile dressing applied. No immediate complication. Findings discussed with the patient and her daughter. IMPRESSION: Patent biliary system. Successful cholecystostomy removal. Electronically Signed   By: Judie Petit.  Shick M.D.   On: 10/19/2015 14:01     Assessment and Plan  Principal Problem:   Acute respiratory failure with hypoxia (HCC) Active Problems:   Acute on chronic diastolic CHF (congestive heart  failure) (HCC)   DNR (do not resuscitate)   Septic shock (HCC)   Hyperkalemia   Medication adverse effect   Drug-induced bradycardia   Acute renal failure (HCC)   ATRIAL FIBRILLATION, CHRONIC   SSS (sick sinus syndrome) (HCC)   Chronic anticoagulation   Pacer at end of battery life   Palliative care by specialist    1. Bradycardia: -Resolved  with holding of rate controlling medications -Currently in the 80's to 90's bpm -Continue to hold rate controlling medications  2. Chronic Afib: -Rate controlled as above -Hold rate controlling medications as above -Continue Coumadin for anticoagulation, per pharmacy -CHADS2VASc at least 4   3. Acute on chronic diastolic CHF: -Continues to improve with diuresis -Continue IV diuresis with KCl repletion -Not on beta blocker as above  4. Depression: -Stable -Family wants continued aggressive care  Signed, Carola Frost [facility] Pager: [phone] 10/29/2015, 8:09 AM   Attending Note Patient seen and examined, agree with detailed note above,  Patient presentation and plan discussed on rounds.   Feeling better today compared to yesterday, less weak Improve shortness of breath In fact was sitting eating breakfast, oxygen was unplugged, appeared to be tolerating room air without respiratory distress   On clinical exam, bolus of bases, poor inspiratory effort, heart sounds irregular with normal rate, murmur appreciated left sternal border, abdomen soft nontender, no leg edema  Lab work reviewed showing low potassium 3.5  INR subtherapeutic 1.5  ----Atrial fibrillation INR subtherapeutic On warfarin Rate relatively well-controlled  --- Acute on chronic diastolic CHF Exacerbated on arrival by bradycardia Rate now improved, responded well to Lasix IV At the time of discharge would restart Lasix 20 mg twice a day, potassium 10 twice a day Close monitoring of weight Could give extra Lasix up to 40 twice a day for 3 pound weight gain or higher  --- Bilateral pleural effusions, right greater than left Secondary to CHF above, Tolerating room air this morning (oxygen was unplugged) Would wean nasal cannula oxygen as tolerated Hold IV Lasix if there is any climb in BUN or creatinine   Greater than 50% was spent in counseling and coordination of care with  patient Total encounter time 25 minutes or more   Signed: Dossie Arbour  M.D., Ph.D. [facility]

## 2015-10-29 NOTE — Care Management (Signed)
Admitted to [facility] with the diagnosis of acute respiratory failure. Lives with husband. Son is Jillyn HiddenGary, [phone]). Last seen Dr. Darrick Huntsmanullo 1-2 months ago. Home Health in the past. No skilled facility. Oxygen in the home. Uses a rolling walker to aid in ambulation. No falls. Fair appetite. Prescriptions are [facility] at [facility]. Followed by [facility].  Serena CroissantBrenda S. Marcelle OverlieHolland RN MSN CCM Care Management [phone]

## 2015-10-29 NOTE — Progress Notes (Addendum)
ANTICOAGULATION CONSULT NOTE - Follow Up Consult  Pharmacy Consult for warfarin dosing Indication: atrial fibrillation  Allergies  Allergen Reactions  . Amoxicillin Other (See Comments)    Reaction:  Weakness Has patient had a PCN reaction causing immediate rash, facial/tongue/throat swelling, SOB or lightheadedness with hypotension: No Has patient had a PCN reaction causing severe rash involving mucus membranes or skin necrosis: No Has patient had a PCN reaction that required hospitalization No Has patient had a PCN reaction occurring within the last 10 years: No If all of the above answers are "NO", then may proceed with Cephalosporin use.  . Cetylpyridinium Other (See Comments)    Reaction:  Weakness   . Digoxin Other (See Comments)    Reaction:  Weakness and dehydration  . Levaquin [Levofloxacin] Itching  . Risedronate Sodium Rash    Patient Measurements: Height: 5\' 4"  (162.6 cm) Weight: 107 lb 8 oz (48.8 kg) IBW/kg (Calculated) : 54.7  Vital Signs: Temp: 98.3 F (36.8 C) (10/02 0427) Temp Source: Oral (10/02 0427) BP: 154/79 (10/02 0427) Pulse Rate: 89 (10/02 0427)  Labs:  Recent Labs  10/27/15 0452 10/28/15 0433 10/29/15 0412  LABPROT 35.5* 21.7* 18.2*  INR 3.44 1.86 1.49  CREATININE 1.05* 0.91 0.82    Estimated Creatinine Clearance: 35.1 mL/min (by C-G formula based on SCr of 0.82 mg/dL).   Medications:  Scheduled:  . atorvastatin  20 mg Oral QHS  . chlorhexidine  15 mL Mouth Rinse BID  . docusate sodium  100 mg Oral BID  . timolol  1 drop Both Eyes BID   And  . dorzolamide  1 drop Both Eyes BID  . feeding supplement (ENSURE ENLIVE)  237 mL Oral BID WC  . furosemide  20 mg Intravenous BID  . ipratropium-albuterol  3 mL Nebulization TID  . latanoprost  1 drop Both Eyes QHS  . levothyroxine  112 mcg Oral Daily  . sodium chloride flush  3 mL Intravenous Q12H  . warfarin  4 mg Oral ONCE-1800  . Warfarin - Pharmacist Dosing Inpatient   Does not apply  q1800    Assessment: Pharmacy consulted to dose and monitor warfarin in this 82 year old female who was taking warfarin prior to admission for atrial fibrillation.  Pt and nursing home report pt is taking 5 mg daily of warfarin for afib (INR goal 2-3). Pt's pacemaker batteries have exhausted and they will not be replaced.  INR = 1.49 today which is subtherapeutic  Dosing history: Date INR Dose 9/27 1.94 5 mg 9/28 4.65 Hold 9/29 5.76 Hold 9/30 3.44 Hold 10/1 1.86 4 mg 10/2 1.49  Goal of Therapy:  INR 2-3 Monitor platelets by anticoagulation protocol: Yes   Plan:  Continue warfarin 4 mg PO q1800. Paged MD to inquire if bridge therapy is needed. Waiting for response.  Will obtain INR with AM labs.    Pharmacy will continue to monitor and adjust per consult.   Cindi CarbonMary M Asyia Hornung, PharmD, BCPS 10/29/2015 7:49 AM  Addendum: Per MD - no anticoagulation bridge for subtherapeutic INR.  Jodelle RedMary M Shamonica Schadt 10/29/15 3:13 PM

## 2015-10-29 NOTE — Progress Notes (Signed)
CH made initial visit. Husband was bedside. Pt seemed emotionally upset and kept saying she wished God would take her home tonight. She feels she has little to live for. (Age is the factor)  Husband has been a steady support, but shows signs of fatigue.  CH provided the ministry of  Presence, empathetic listening, and prayer.I will follow up with this family on my next rounding.    10/29/15 1300  Clinical Encounter Type  Visited With Patient;Patient and family together;Health care provider  Visit Type Initial;Spiritual support  Referral From Nurse  Spiritual Encounters  Spiritual Needs Prayer;Emotional  Stress Factors  Patient Stress Factors Exhausted;Health changes;Major life changes

## 2015-10-30 ENCOUNTER — Encounter
Admission: RE | Admit: 2015-10-30 | Discharge: 2015-10-30 | Disposition: A | Discharge status: Home / Self Care | Payer: Medicare Other | Source: Ambulatory Visit | Attending: Internal Medicine | Admitting: Internal Medicine

## 2015-10-30 DIAGNOSIS — Z66 Do not resuscitate: Secondary | ICD-10-CM

## 2015-10-30 DIAGNOSIS — Z515 Encounter for palliative care: Secondary | ICD-10-CM

## 2015-10-30 DIAGNOSIS — M6281 Muscle weakness (generalized): Secondary | ICD-10-CM

## 2015-10-30 LAB — CBC
HEMATOCRIT: 37.3 % (ref 35.0–47.0)
Hemoglobin: 12.8 g/dL (ref 12.0–16.0)
MCH: 30.9 pg (ref 26.0–34.0)
MCHC: 34.2 g/dL (ref 32.0–36.0)
MCV: 90.5 fL (ref 80.0–100.0)
Platelets: 143 10*3/uL — ABNORMAL LOW (ref 150–440)
RBC: 4.13 MIL/uL (ref 3.80–5.20)
RDW: 15 % — AB (ref 11.5–14.5)
WBC: 7.5 10*3/uL (ref 3.6–11.0)

## 2015-10-30 LAB — PROTIME-INR
INR: 1.41
PROTHROMBIN TIME: 17.4 s — AB (ref 11.4–15.2)

## 2015-10-30 MED ORDER — WARFARIN SODIUM 2.5 MG PO TABS: 5.0000 mg | ORAL_TABLET | Freq: Once | ORAL | Status: DC

## 2015-10-30 MED ORDER — SENNOSIDES-DOCUSATE SODIUM 8.6-50 MG PO TABS: 1.0000 | ORAL_TABLET | Freq: Every evening | ORAL | 0 refills | Status: AC | PRN

## 2015-10-30 MED ORDER — IPRATROPIUM-ALBUTEROL 0.5-2.5 (3) MG/3ML IN SOLN: 3.0000 mL | Freq: Four times a day (QID) | RESPIRATORY_TRACT | Status: DC | PRN

## 2015-10-30 MED ORDER — HYDROCODONE-ACETAMINOPHEN 10-325 MG PO TABS: 1.0000 | ORAL_TABLET | Freq: Three times a day (TID) | ORAL | 0 refills | Status: DC | PRN

## 2015-10-30 MED ORDER — LORAZEPAM 0.5 MG PO TABS: 0.2500 mg | ORAL_TABLET | Freq: Every day | ORAL | 0 refills | Status: AC

## 2015-10-30 NOTE — Progress Notes (Signed)
Visit made. Patient seen sitting up in bed, [facility] during visit. Plan is for patient to discharge today [facility] rehab with Palliative services to follow.  Palliative NP Lorinda CreedMary Larach had a family meeting held this morning with patient, her son Jillyn HiddenGary and her husband. A MOST form was completed. Writer spoke on the phone with Frazier ButtGary Kendra to explain revocation of hospice benefit form, patient is alert and oriented and signed the form herself. Writer wanted to be sure he understood the difference between Palliative Services and hospice services, understanding voiced.Update provided to hospice team. Updated notes faxed to hospice triage. Thank you. Dayna BarkerKaren Robertson RN, BSN, [facility], hospital Liaison [phone] c

## 2015-10-30 NOTE — Clinical Social Work Placement (Signed)
   CLINICAL SOCIAL WORK PLACEMENT  NOTE  Date:  10/30/2015  Patient Details  Name: Laurie Larsen MRN: [id number] Date of Birth: May 01, 1923  Clinical Social Work is seeking post-discharge placement for this patient at the Skilled  Nursing Facility level of care (*CSW will initial, date and re-position this form in  chart as items are completed):  Yes   Patient/family provided with [facility] Clinical Social Work Department's list of facilities offering this level of care within the geographic area requested by the patient (or if unable, by the patient's family).  Yes   Patient/family informed of their freedom to choose among providers that offer the needed level of care, that participate in Medicare, Medicaid or managed care program needed by the patient, have an available bed and are willing to accept the patient.  Yes   Patient/family informed of [facility]'s ownership interest in [facility] and [facility], as well as of the fact that they are under no obligation to receive care at these facilities.  PASRR submitted to EDS on       PASRR number received on       Existing PASRR number confirmed on 10/30/15     FL2 transmitted to all facilities in geographic area requested by pt/family on 10/30/15     FL2 transmitted to all facilities within larger geographic area on       Patient informed that his/her managed care company has contracts with or will negotiate with certain facilities, including the following:        Yes   Patient/family informed of bed offers received.  Patient chooses bed at  [facility]     Physician recommends and patient chooses bed at      Patient to be transferred to  [facility] on 10/30/15.  Patient to be transferred to facility by  (EMS)     Patient family notified on 10/30/15 of transfer.  Name of family member notified:   Jillyn Hidden(Gary- Son)     PHYSICIAN       Additional Comment:     _______________________________________________ Idamae Lusherhristina E Aditya Nastasi, LCSW 10/30/2015, 1:46 PM

## 2015-10-30 NOTE — Progress Notes (Signed)
[facility] per MD order. Report given to Morrisonarmen at facility. EMS called for transportation.

## 2015-10-30 NOTE — [place] FL2 (Signed)
[place] MEDICAID FL2 LEVEL OF CARE SCREENING TOOL     IDENTIFICATION  Patient Name: Laurie CobbsMargaret N Bosket Birthdate: November 16, 1923 Sex: female Admission Date (Current Location): 10/24/2015  [place]   Facility and Address:  [facility], [address], [place], [place] [address]      Provider Number: [id number]  Attending Physician Name and Address:  Katha HammingSnehalatha Konidena, MD  Relative Name and Phone Number:       Current Level of Care: Hospital Recommended Level of Care: Skilled Nursing Facility Prior Approval Number:    Date Approved/Denied:   PASRR Number:    Discharge Plan: SNF    Current Diagnoses: Patient Active Problem List   Diagnosis Date Noted  . Palliative care by specialist 10/25/2015  . Acute respiratory failure with hypoxia (HCC) 10/24/2015  . Septic shock (HCC) 10/24/2015  . Hyperkalemia 10/24/2015  . Medication adverse effect 10/24/2015  . Drug-induced bradycardia 10/24/2015  . Pacer at end of battery life 10/24/2015  . Acute renal failure (HCC) 10/24/2015  . Cholecystostomy care (HCC) 09/05/2015  . Pneumonia 06/13/2015  . DNR (do not resuscitate) 04/06/2015  . Malnutrition of moderate degree 03/26/2015  . Insomnia 02/20/2015  . Macular degeneration 02/20/2015  . Chronic pain syndrome 02/20/2015  . Screening for breast cancer 02/20/2015  . Dyspnea   . Dry eyes 12/14/2014  . Posterior vitreous detachment of both eyes 12/14/2014  . Glaucoma 11/22/2014  . Protein-calorie malnutrition, severe (HCC) 06/01/2014  . Acute on chronic congestive heart failure (HCC) 05/31/2014  . CHF (congestive heart failure), NYHA class I (HCC) 05/31/2014  . Cardiac pacemaker in situ 03/10/2014  . HLD (hyperlipidemia) 03/10/2014  . Nonexudative age-related macular degeneration 12/01/2013  . Status post intraocular lens implant 12/01/2013  . Pulmonary HTN 06/09/2013  . Nocturnal hypoxemia 04/20/2013  . Depression  06/23/2011  . Acute on chronic diastolic CHF (congestive heart failure) (HCC) 11/13/2010  . Pleural effusion 10/01/2010  . Chronic anticoagulation 07/10/2010  . Hypothyroidism 04/24/2010  . Essential hypertension, benign 03/01/2010  . ATRIAL FIBRILLATION, CHRONIC 03/01/2010  . SSS (sick sinus syndrome) (HCC) 03/01/2010    Orientation RESPIRATION BLADDER Height & Weight     Self, Time, Situation  O2 (2 L/min) Continent Weight: 108 lb 11.2 oz (49.3 kg) Height:  5\' 4"  (162.6 cm)  BEHAVIORAL SYMPTOMS/MOOD NEUROLOGICAL BOWEL NUTRITION STATUS   (None.)  (None.) Continent Diet (Heart)  AMBULATORY STATUS COMMUNICATION OF NEEDS Skin   Limited Assist Verbally Normal                       Personal Care Assistance Level of Assistance  Bathing, Feeding, Dressing Bathing Assistance: Limited assistance Feeding assistance: Independent Dressing Assistance: Limited assistance     Functional Limitations Info  Sight, Hearing, Speech Sight Info: Adequate Hearing Info: Adequate Speech Info: Adequate    SPECIAL CARE FACTORS FREQUENCY  PT (By licensed PT)     PT Frequency: 5              Contractures      Additional Factors Info  Code Status, Allergies Code Status Info: DNR Allergies Info: Amoxicillin, Cetylpyridinium, Digoxin, Levaquin Levofloxacin, Risedronate Sodium           Current Medications (10/30/2015):  This is the current hospital active medication list Current Facility-Administered Medications  Medication Dose Route Frequency Provider Last Rate Last Dose  . 0.9 %  sodium chloride infusion  250 mL Intravenous PRN Shaune PollackQing Chen, MD      .  acetaminophen (TYLENOL) tablet 650 mg  650 mg Oral Q6H PRN Shaune Pollack, MD   650 mg at 10/28/15 0024   Or  . acetaminophen (TYLENOL) suppository 650 mg  650 mg Rectal Q6H PRN Shaune Pollack, MD      . albuterol (PROVENTIL) (2.5 MG/3ML) 0.083% nebulizer solution 2.5 mg  2.5 mg Nebulization Q2H PRN Shaune Pollack, MD      . atorvastatin (LIPITOR)  tablet 20 mg  20 mg Oral QHS Shaune Pollack, MD   20 mg at 10/29/15 2212  . chlorhexidine (PERIDEX) 0.12 % solution 15 mL  15 mL Mouth Rinse BID Shane Crutch, MD   15 mL at 10/30/15 1057  . docusate sodium (COLACE) capsule 100 mg  100 mg Oral BID Shaune Pollack, MD   100 mg at 10/30/15 1048  . timolol (TIMOPTIC) 0.5 % ophthalmic solution 1 drop  1 drop Both Eyes BID Foye Deer, RPH   1 drop at 10/30/15 1058   And  . dorzolamide (TRUSOPT) 2 % ophthalmic solution 1 drop  1 drop Both Eyes BID Foye Deer, RPH   1 drop at 10/30/15 1058  . feeding supplement (ENSURE ENLIVE) (ENSURE ENLIVE) liquid 237 mL  237 mL Oral BID WC Shaune Pollack, MD   237 mL at 10/30/15 1057  . furosemide (LASIX) injection 20 mg  20 mg Intravenous BID Shaune Pollack, MD   20 mg at 10/30/15 1051  . HYDROcodone-acetaminophen (NORCO) 10-325 MG per tablet 1 tablet  1 tablet Oral TID PRN Shaune Pollack, MD   1 tablet at 10/29/15 2212  . Influenza vac split quadrivalent PF (FLUARIX) injection 0.5 mL  0.5 mL Intramuscular Prior to discharge Valentina Gu, RPH      . ipratropium-albuterol (DUONEB) 0.5-2.5 (3) MG/3ML nebulizer solution 3 mL  3 mL Nebulization TID Katha Hamming, MD   3 mL at 10/30/15 0811  . latanoprost (XALATAN) 0.005 % ophthalmic solution 1 drop  1 drop Both Eyes QHS Katha Hamming, MD   1 drop at 10/29/15 2213  . levothyroxine (SYNTHROID, LEVOTHROID) tablet 112 mcg  112 mcg Oral Daily Shaune Pollack, MD   112 mcg at 10/30/15 [phone]  . morphine CONCENTRATE 10 MG/0.5ML oral solution 2.6 mg  2.6 mg Oral Q2H PRN Shaune Pollack, MD      . ondansetron [facility]) tablet 4 mg  4 mg Oral Q6H PRN Shaune Pollack, MD       Or  . ondansetron [facility]) injection 4 mg  4 mg Intravenous Q6H PRN Shaune Pollack, MD   4 mg at 10/24/15 1806  . polyethylene glycol (MIRALAX / GLYCOLAX) packet 17 g  17 g Oral Daily PRN Katha Hamming, MD   17 g at 10/30/15 1050  . polyvinyl alcohol (LIQUIFILM TEARS) 1.4 % ophthalmic solution 1 drop  1 drop Both Eyes Q4H PRN  Katha Hamming, MD   1 drop at 10/27/15 1543  . potassium chloride SA (K-DUR,KLOR-CON) CR tablet 20 mEq  20 mEq Oral BID Raymon Mutton Dunn, PA-C   20 mEq at 10/30/15 1048  . senna-docusate (Senokot-S) tablet 1 tablet  1 tablet Oral QHS PRN Shaune Pollack, MD   1 tablet at 10/30/15 1048  . sodium chloride flush (NS) 0.9 % injection 3 mL  3 mL Intravenous Q12H Shaune Pollack, MD   3 mL at 10/30/15 1052  . sodium chloride flush (NS) 0.9 % injection 3 mL  3 mL Intravenous PRN Shaune Pollack, MD      . Warfarin - Pharmacist  Dosing Inpatient   Does not apply q1800 Rolm Baptise, [facility]         Discharge Medications: Please see discharge summary for a list of discharge medications.  Relevant Imaging Results:  Relevant Lab Results:   Additional Information SSN: [id number]  Thelma Barge, Student-Social Work

## 2015-10-30 NOTE — Discharge Summary (Signed)
Laurie Larsen, is a 82 y.o. female  DOB 1923/06/13  MRN [id number].  Admission date:  10/24/2015  Admitting Physician  Shaune Pollack, MD  Discharge Date:  10/30/2015   Primary MD  Sherlene Shams, MD  Recommendations for primary care physician for things to follow:  Follow-up with primary doctor in 1 week   Admission Diagnosis  Acute respiratory failure with hypoxia (HCC) [J96.01] Acute on chronic congestive heart failure, unspecified congestive heart failure type (HCC) [I50.9]   Discharge Diagnosis  Acute respiratory failure with hypoxia (HCC) [J96.01] Acute on chronic congestive heart failure, unspecified congestive heart failure type (HCC) [I50.9]    Principal Problem:   Acute respiratory failure with hypoxia (HCC) Active Problems:   ATRIAL FIBRILLATION, CHRONIC   SSS (sick sinus syndrome) (HCC)   Chronic anticoagulation   Acute on chronic diastolic CHF (congestive heart failure) (HCC)   DNR (do not resuscitate)   Septic shock (HCC)   Hyperkalemia   Medication adverse effect   Drug-induced bradycardia   Pacer at end of battery life   Acute renal failure [facility])   Palliative care by specialist      Past Medical History:  Diagnosis Date  . Acalculous cholecystitis   . Allergy   . Arthritis   . Back complaints   . Bronchitis   . CAD (coronary artery disease)    a. 2005 Cath: 100% dLCX-->Med managed.  . CHF (congestive heart failure) (HCC)   . Chronic diastolic CHF (congestive heart failure), NYHA class 1 (HCC)    a. 04/2013 Echo: EF 50-55%, triv MR, mildly dil LA, PASP , mild TR, mild-mod RA dil. b. echo 02/2015: EF 55-60%, mild MR, mild-mod TR, PA peak pressure 72 mm Hg  . Chronic pain    a. ? due to arthritis  . Chronic Right Pleural Effusion    a. 07/2012 s/p thoracentesis - [facility]ology. b. 02/2015: s/p repeat R thoracentesis  . Colon polyps   . Colon polyps   . Diverticulitis large intestine   . Glaucoma   . Hay fever   . History of blood transfusion   . HTN (hypertension)   . Hyperlipidemia   . Hypothyroidism   . Permanent atrial fibrillation (HCC)    a. Chronic Coumadin.  [name] Phlebitis    a. after pacemeker placement in 2010.  . Pulmonary HTN    a. PASP on echo 04/2013. b. PASP 72 mmHg on echo in 02/2015  . Rash/skin eruption    a. Multiple episodes - wide distribution-intermittent, possibly drug rash.  . Sick sinus syndrome (HCC)    a. 05/2008 s/p MDT EnRhythm DC PPM ([facility]).  . Transfusion history    a. After childbirth 60 years ago (1957)  . Urine incontinence     Past Surgical History:  Procedure Laterality Date  . ABDOMINAL HYSTERECTOMY  1977  . ANKLE FRACTURE SURGERY  1979   Left, pin  . APPENDECTOMY  1939  . BREAST BIOPSY  2011  . BREAST FIBROADENOMA SURGERY  2010   Right  . BREAST SURGERY     Needle guided excision, right breast calcification  . IR GENERIC HISTORICAL  10/19/2015   IR CHOLANGIOGRAM EXISTING TUBE 10/19/2015 Berdine Dance, MD [facility]-INTERV RAD  . KIDNEY SURGERY  1955   Right  . PACEMAKER INSERTION  06/20/08   MDT EnRhythm DR implanted by Dr Reyes Ivan  . SKIN GRAFT     left leg  . TONSILLECTOMY  82  yrs old       History of present illness and  Hospital Course:     Kindly see H&P for history of present illness and admission details, please review complete Labs, Consult reports and Test reports for all details in brief  HPI  from the history and physical done on the day of admission 82 year old female patient admitted for weakness, shortness of breath.  X-ray concerning for CHF, a BNP 1549. Patient admitted to ICU for acute respiratory failure secondary to CHF, initial oxygen son non rebreather  started on [facility] Course  Metabolic and respiratory failure with hypoxia secondary to acute on  chronic diastolic heart failure; Admitted to stepdown, started on BiPAP, IV Lasix tand 20 mg every 12 hours. Patient is  Cardiology, pulmonary and critical care. Patient able to contact the BiPAP on the 100% nonrebreather also. Now she is maintaining 95% saturation on 2 L. Patient will be [facility] on the Lasix 20 mg twice a day.Cardiogram showed EF of 60%.without any  Regional wall motion abnormalities  #2 bradycardia, patient has history of atrial fibrillation,  and sick syndrome with pacemaker placement.., Pacer is at the end of battery life.patient HR  was 39  In ER room. Because of  Bradycardia,we held bblocker..bradycardia resolved,.seen by cardio/continue to hold  rate controlling medications.stop cardizem and atenolol at discharge #3 history of depression:Family wants continued aggressive care, the patient DO NOT RESUSCITATE, palliative care has seen the patient. Physical therapy  Recommends rehab/SNF> patient will go to [facility]> 4.SupraTherapeutic INR:INR on admission 5.76. COUMADIN was held.pharmacy to manage anticoagulation.INR today 1.41    Discharge Condition: stable   Follow UP      Discharge Instructions  and  Discharge Medications       Medication List    STOP taking these medications   atenolol 25 MG tablet Commonly known as:  TENORMIN   dexamethasone 2 MG tablet Commonly known as:  DECADRON   diltiazem 180 MG 24 hr capsule Commonly known as:  CARDIZEM CD   doxycycline 100 MG tablet Commonly known as:  VIBRA-TABS   morphine CONCENTRATE 10 mg / 0.5 ml concentrated solution     TAKE these medications   acetaminophen 650 MG CR tablet Commonly known as:  TYLENOL Take 1,300 mg by mouth at bedtime as needed for pain.   atorvastatin 20 MG tablet Commonly known as:  LIPITOR Take 1 tablet (20 mg total) by mouth at bedtime.   COSOPT 22.3-6.8 MG/ML ophthalmic solution Generic drug:  dorzolamide-timolol Place 1 drop into both eyes 2 (two) times  daily.   docusate sodium 100 MG capsule Commonly known as:  COLACE Take 1 capsule (100 mg total) by mouth 2 (two) times daily.   feeding supplement (ENSURE ENLIVE) Liqd Take 237 mLs by mouth 2 (two) times daily with a meal.   furosemide 20 MG tablet Commonly known as:  LASIX Take 1 tablet (20 mg total) by mouth 2 (two) times daily.   HYDROcodone-acetaminophen 10-325 MG tablet Commonly known as:  NORCO Take 1 tablet by mouth 3 (three) times daily as needed for moderate pain or severe pain.   ipratropium-albuterol 0.5-2.5 (3) MG/3ML Soln Commonly known as:  DUONEB Take 3 mLs by nebulization every 6 (six) hours.   levothyroxine 112 MCG tablet Commonly known as:  SYNTHROID, LEVOTHROID Take 1 tablet (112 mcg total) by mouth daily.   LORazepam 0.5 MG tablet Commonly known as:  ATIVAN Take 0.5 tablets (0.25 mg total) by mouth at bedtime.  polyethylene glycol packet Commonly known as:  MIRALAX / GLYCOLAX Take 17 g by mouth at bedtime.   potassium chloride 10 MEQ tablet Commonly known as:  K-DUR Take 1 tablet (10 mEq total) by mouth 2 (two) times daily.   senna-docusate 8.6-50 MG tablet Commonly known as:  Senokot-S Take 1 tablet by mouth at bedtime as needed for mild constipation.   traMADol 50 MG tablet Commonly known as:  ULTRAM Take 1 tablet (50 mg total) by mouth 3 (three) times daily as needed for moderate pain. Reported on 06/20/2015   Travoprost (BAK Free) 0.004 % Soln ophthalmic solution Commonly known as:  TRAVATAN Place 1 drop into both eyes at bedtime.   Vitamin D (Ergocalciferol) 50000 units Caps capsule Commonly known as:  DRISDOL Take 50,000 Units by mouth every 7 (seven) days. Pt takes on Saturday.   warfarin 5 MG tablet Commonly known as:  COUMADIN Take 5 mg by mouth daily at 6 PM.         Diet and Activity recommendation: See Discharge Instructions above   Consults obtained - cardiology, physical therapy, pulmonary and critical care   Major  procedures and Radiology Reports - PLEASE review detailed and final reports for all details, in brief -      Dg Chest 1 View  Result Date: 10/26/2015 CLINICAL DATA:  Shortness of breath.  Bronchitis. EXAM: CHEST 1 VIEW COMPARISON:  10/25/2015. FINDINGS: Cardiac pacer with lead tips in right atrium right ventricle. Cardiomegaly with bilateral pulmonary infiltrates. Bilateral pleural effusions. No pneumothorax. No acute bony abnormality . IMPRESSION: 1. Cardiac pacer with lead tips in right atrium right ventricle. Cardiomegaly with bilateral pulmonary infiltrates/edema right side greater than left. Bilateral pleural effusions, right side greater than left. Findings most consistent with congestive heart failure. Similar findings noted on prior exam. 2.  Low lung volumes. Electronically Signed   By: Maisie Fus  Register   On: 10/26/2015 12:12   Dg Chest 1 View  Result Date: 10/25/2015 CLINICAL DATA:  Shortness of breath. EXAM: CHEST 1 VIEW COMPARISON:  10/24/2015.  08/17/2015. FINDINGS: Cardiac pacer with lead tip in right atrium and right ventricle. Cardiomegaly with bilateral pulmonary infiltrates, right side greater than left. Bilateral pleural effusions, right side greater than left. Similar findings noted on prior exam . No pneumothorax. IMPRESSION: Cardiac pacer with lead tips in right atrium right ventricle. Cardiomegaly with pulmonary vascular prominence and bilateral pulmonary infiltrates consistent with pulmonary edema, right side greater than left. Similar findings noted on prior exam. Bilateral pleural effusions, right side greater left also noted. Electronically Signed   By: Maisie Fus  Register   On: 10/25/2015 07:24   Dg Chest Portable 1 View  Result Date: 10/24/2015 CLINICAL DATA:  Shortness of Breath EXAM: PORTABLE CHEST 1 VIEW COMPARISON:  08/17/2015 FINDINGS: Cardiomegaly. Central mild vascular congestion and mild infrahilar interstitial prominence suspicious for mild pulmonary edema. Bilateral  small pleural effusion with bilateral basilar atelectasis or infiltrate right greater than left. Double lead cardiac pacemaker is unchanged in position. IMPRESSION: Central mild vascular congestion and mild infrahilar interstitial prominence suspicious for mild pulmonary edema. Bilateral small pleural effusion with bilateral basilar atelectasis or infiltrate right greater than left. Electronically Signed   By: Natasha Mead M.D.   On: 10/24/2015 09:45   Ir Cholangiogram Existing Tube  Result Date: 10/19/2015 INDICATION: Chronic cholecystitis, chronic indwelling cholecystostomy which has been capped for several months and well tolerated. EXAM: CHOLANGIOGRAM VIA EXISTING CATHETER MEDICATIONS: None. ANESTHESIA/SEDATION: None. FLUOROSCOPY TIME:  Fluoroscopy Time: 0 minutes 36  seconds (5 mGy). COMPLICATIONS: None immediate. PROCEDURE: Informed written consent was obtained from the patient after a thorough discussion of the procedural risks, benefits and alternatives. All questions were addressed. Maximal Sterile Barrier Technique was utilized including caps, mask, sterile gowns, sterile gloves, sterile drape, hand hygiene and skin antiseptic. A timeout was performed prior to the initiation of the procedure. Under sterile conditions, the existing cholecystostomy was injected with contrast for cholangiogram. This demonstrates a collapsed gallbladder. Cystic duct, common hepatic duct, common bile duct are patent. Contrast easily drains into the duodenum. Gallbladder was decompressed by syringe aspiration. Cholecystostomy was cut and removed without difficulty. Sterile dressing applied. No immediate complication. Findings discussed with the patient and her daughter. IMPRESSION: Patent biliary system. Successful cholecystostomy removal. Electronically Signed   By: Judie Petit.  Shick M.D.   On: 10/19/2015 14:01    Micro Results     Recent Results (from the past 240 hour(s))  MRSA PCR Screening     Status: None   Collection  Time: 10/24/15 12:39 PM  Result Value Ref Range Status   MRSA by PCR NEGATIVE NEGATIVE Final    Comment:        The GeneXpert MRSA Assay (FDA approved for NASAL specimens only), is one component of a comprehensive MRSA colonization surveillance program. It is not intended to diagnose MRSA infection nor to guide or monitor treatment for MRSA infections.   Urine culture     Status: Abnormal   Collection Time: 10/24/15  1:29 PM  Result Value Ref Range Status   Specimen Description URINE, RANDOM  Final   Special Requests NONE  Final   Culture MULTIPLE SPECIES PRESENT, SUGGEST RECOLLECTION (A)  Final   Report Status 10/25/2015 FINAL  Final       Today   Subjective:   Laurie Larsen today has no headache,no chest abdominal pain,no new weakness tingling or numbness, feels much better wants to go home today.  Objective:   Blood pressure (!) 159/90, pulse 88, temperature 97.6 F (36.4 C), temperature source Oral, resp. rate 18, height 5\' 4"  (1.626 m), weight 49.3 kg (108 lb 11.2 oz), SpO2 98 %.   Intake/Output Summary (Last 24 hours) at 10/30/15 1054 Last data filed at 10/30/15 1039  Gross per 24 hour  Intake              120 ml  Output             1650 ml  Net            -1530 ml    Exam Awake Alert, Oriented x 3, No new F.N deficits, Normal affect [place].AT,PERRAL Supple Neck,No JVD, No cervical lymphadenopathy appriciated.  Symmetrical Chest wall movement, Good air movement bilaterally, CTAB RRR,No Gallops,Rubs or new Murmurs, No Parasternal Heave +ve B.Sounds, Abd Soft, Non tender, No organomegaly appriciated, No rebound -guarding or rigidity. No Cyanosis, Clubbing or edema, No new Rash or bruise  Data Review   CBC w Diff:  Lab Results  Component Value Date   WBC 7.5 10/30/2015   HGB 12.8 10/30/2015   HGB 11.5 (L) 11/06/2013   HCT 37.3 10/30/2015   HCT 36.3 11/06/2013   PLT 143 (L) 10/30/2015   PLT 178 11/06/2013   LYMPHOPCT 10 10/24/2015   LYMPHOPCT 8.0  11/06/2013   MONOPCT 12 10/24/2015   MONOPCT 9.4 11/06/2013   EOSPCT 2 10/24/2015   EOSPCT 1.6 11/06/2013   BASOPCT 1 10/24/2015   BASOPCT 1.2 11/06/2013    CMP:  Lab Results  Component Value Date   NA 137 10/29/2015   NA 140 11/06/2013   K 3.5 10/29/2015   K 3.0 (L) 11/06/2013   CL 99 (L) 10/29/2015   CL 107 11/06/2013   CO2 30 10/29/2015   CO2 24 11/06/2013   BUN 45 (H) 10/29/2015   BUN 14 11/06/2013   CREATININE 0.82 10/29/2015   CREATININE 0.99 (H) 08/17/2015   PROT 6.7 10/24/2015   ALBUMIN 3.6 10/24/2015   BILITOT 0.6 10/24/2015   ALKPHOS 92 10/24/2015   AST 36 10/24/2015   ALT 30 10/24/2015  .   Total Time in preparing paper work, data evaluation and todays exam - 35 minutes  Ahmira Boisselle M.D on 10/30/2015 at 10:54 AM    Note: This dictation was prepared with Dragon dictation along with smaller phrase technology. Any transcriptional errors that result from this process are unintentional.

## 2015-10-30 NOTE — Progress Notes (Signed)
[facility] - [place] at [facility]   PATIENT NAME: Laurie Larsen    MR#:  [id number]  DATE OF BIRTH:  09/26/1923  SUBJECTIVE: 82 year old female patient admitted for acute respiratory failure because of shortness of breath, heart failure. Seen today. Patient appears weak. No trouble breathing but overall appears weak. Palliative care to meet with family today afternoon. Physical therapy recommends SNF placement.   CHIEF COMPLAINT:   Chief Complaint  Patient presents with  . Weakness    REVIEW OF SYSTEMS:    Review of Systems  Constitutional: Negative for chills and fever.  HENT: Negative for hearing loss.   Eyes: Negative for blurred vision, double vision and photophobia.  Respiratory: Negative for cough, hemoptysis and shortness of breath.   Cardiovascular: Negative for chest pain, palpitations, orthopnea and leg swelling.  Gastrointestinal: Negative for abdominal pain, diarrhea and vomiting.  Genitourinary: Negative for dysuria and urgency.  Musculoskeletal: Negative for myalgias and neck pain.  Skin: Negative for rash.  Neurological: Negative for dizziness, focal weakness, seizures, weakness and headaches.  Psychiatric/Behavioral: Negative for memory loss. The patient does not have insomnia.     Nutrition:  Tolerating Diet: Tolerating PT:      DRUG ALLERGIES:   Allergies  Allergen Reactions  . Amoxicillin Other (See Comments)    Reaction:  Weakness Has patient had a PCN reaction causing immediate rash, facial/tongue/throat swelling, SOB or lightheadedness with hypotension: No Has patient had a PCN reaction causing severe rash involving mucus membranes or skin necrosis: No Has patient had a PCN reaction that required hospitalization No Has patient had a PCN reaction occurring within the last 10 years: No If all of the above answers are "NO", then may proceed with Cephalosporin use.  . Cetylpyridinium Other (See Comments)    Reaction:   Weakness   . Digoxin Other (See Comments)    Reaction:  Weakness and dehydration  . Levaquin [Levofloxacin] Itching  . Risedronate Sodium Rash    VITALS:  Blood pressure (!) 159/90, pulse 88, temperature 97.6 F (36.4 C), temperature source Oral, resp. rate 18, height 5\' 4"  (1.626 m), weight 49.3 kg (108 lb 11.2 oz), SpO2 98 %.  PHYSICAL EXAMINATION:   Physical Exam  GENERAL:  82 y.o.-year-old patient lying in the bed with no acute distress.  EYES: Pupils equal, round, reactive to light and accommodation. No scleral icterus. Extraocular muscles intact.  HEENT: Head atraumatic, normocephalic. Oropharynx and nasopharynx clear.  NECK:  Supple, no jugular venous distention. No thyroid enlargement, no tenderness.  LUNGS: decreased BS at bases. no wheezing, rales,rhonchi or crepitation. No use of accessory muscles of respiration.  CARDIOVASCULAR: S1, S2 normal. No murmurs, rubs, or gallops.  organomegaly or mass.  EXTREMITIES: No pedal edema, cyanosis, or clubbing.  NEUROLOGIC: Cranial nerves II through XII are intact. Muscle strength 5/5 in all extremities. Sensation intact. Gait not checked.  PSYCHIATRIC: The patient is alert and oriented x 3.  SKIN: No obvious rash, lesion, or ulcer.    LABORATORY PANEL:   CBC  Recent Labs Lab 10/30/15 0633  WBC 7.5  HGB 12.8  HCT 37.3  PLT 143*   ------------------------------------------------------------------------------------------------------------------  Chemistries   Recent Labs Lab 10/24/15 0909  10/29/15 0412  NA 134*  < > 137  K 5.6*  < > 3.5  CL 104  < > 99*  CO2 17*  < > 30  GLUCOSE 153*  < > 105*  BUN 41*  < > 45*  CREATININE 1.82*  < >  0.82  CALCIUM 8.4*  < > 8.2*  MG 2.3  --   --   AST 36  --   --   ALT 30  --   --   ALKPHOS 92  --   --   BILITOT 0.6  --   --   < > = values in this interval not  displayed. ------------------------------------------------------------------------------------------------------------------  Cardiac Enzymes  Recent Labs Lab 10/24/15 0909  TROPONINI 0.03*   ------------------------------------------------------------------------------------------------------------------  RADIOLOGY:  No results found.   ASSESSMENT AND PLAN:   Principal Problem:   Acute respiratory failure with hypoxia (HCC) Active Problems:   ATRIAL FIBRILLATION, CHRONIC   SSS (sick sinus syndrome) (HCC)   Chronic anticoagulation   Acute on chronic diastolic CHF (congestive heart failure) (HCC)   DNR (do not resuscitate)   Septic shock (HCC)   Hyperkalemia   Medication adverse effect   Drug-induced bradycardia   Pacer at end of battery life   Acute renal failure (HCC)   Palliative care by specialist  #1 acute respiratory failure secondary to acute heart failure. Acute on chronic diastolic heart failure: Patient respiratory status improving with IV Lasix.[name]. Physical therapy recommended SNF placement. Family to meet with palliative care team today.\  #2 atrial fibrillation chronic, had bradycardia admission, but  bradycardia resolved.  seen  By cardiology, started on Norvasc for blood pressure. reStarted the Coumadin. Supratherapeutic INR is corrected.     #3. hyperkalemia treated. 4.Acute renal failure: Likely due to CHF;improved    #6 history of cholecystitis, cholecystostomy tube placement before status post removal. LFTs normal. Tolerating diet now  #7 physical therapy consulted. Recommended SNF placement..  #8 constipation: Continue stool softeners.   All the records are reviewed and case discussed with Care Management/Social Workerr. Management plans discussed with the patient, family and they are in agreement.  CODE STATUS: DNR  TOTAL TIME TAKING CARE OF THIS PATIENT: 35minutes.   POSSIBLE D/C IN 2-3 DAYS, DEPENDING ON CLINICAL  CONDITION.   Katha HammingKONIDENA,Rickie Gutierres M.D on 10/30/2015 at 9:08 AM  Between 7am to 6pm - Pager - [phone]  After 6pm go to [email or web address] - password [email or web address] [facility]  [facility]  Office  [phone]  CC: Primary care physician; Sherlene ShamsULLO, TERESA L, MD

## 2015-10-30 NOTE — Progress Notes (Signed)
Daily Progress Note   Patient Name: Laurie Larsen       Date: 10/30/2015 DOB: 1923/09/19  Age: 82 y.o. MRN#: [id number] Attending Physician: Katha Hamming, MD Primary Care Physician: Sherlene Shams, MD Admit Date: 10/24/2015  Reason for Consultation/Follow-up: Establishing goals of care and Psychosocial/spiritual support  Subjective:  - Continued conversation  patient's bedside along with her son and husband and another son by telephone conference  to discuss diagnosis, prognosis, GOC, EOL wishes disposition and options.  A detailed discussion was had today regarding advanced directives.  Concepts specific to code status, artifical feeding and hydration, continued IV antibiotics and rehospitalization was had.  The difference between a aggressive medical intervention path  and a palliative comfort care path for this patient at this time was had.  Values and goals of care important to patient and family were attempted to be elicited.  MOST form completed  Concept of Hospice and Palliative Care were discussed  Questions and concerns addressed.   Family encouraged to call with questions or concerns.  PMT will continue to support holistically.    Length of Stay: 6  Current Medications: Scheduled Meds:  . atorvastatin  20 mg Oral QHS  . chlorhexidine  15 mL Mouth Rinse BID  . docusate sodium  100 mg Oral BID  . timolol  1 drop Both Eyes BID   And  . dorzolamide  1 drop Both Eyes BID  . feeding supplement (ENSURE ENLIVE)  237 mL Oral BID WC  . furosemide  20 mg Intravenous BID  . latanoprost  1 drop Both Eyes QHS  . levothyroxine  112 mcg Oral Daily  . potassium chloride  20 mEq Oral BID  . sodium chloride flush  3 mL Intravenous Q12H  . Warfarin - Pharmacist Dosing Inpatient    Does not apply q1800    Continuous Infusions:    PRN Meds: sodium chloride, acetaminophen **OR** acetaminophen, albuterol, HYDROcodone-acetaminophen, Influenza vac split quadrivalent PF, ipratropium-albuterol, morphine CONCENTRATE, ondansetron **OR** ondansetron (ZOFRAN) IV, polyethylene glycol, polyvinyl alcohol, senna-docusate, sodium chloride flush  Physical Exam  Constitutional: She appears ill.  -frail  Cardiovascular: Normal rate.  An irregular rhythm present.  Pulmonary/Chest: She has decreased breath sounds in the right lower field and the left lower field.  Musculoskeletal:  - generalized weakness  Neurological: She is  alert.  Skin: Skin is warm and dry.            Vital Signs: BP (!) 159/90 (BP Location: Right Arm)   Pulse 88   Temp 97.6 F (36.4 C) (Oral)   Resp 18   Ht 5\' 4"  (1.626 m)   Wt 49.3 kg (108 lb 11.2 oz)   SpO2 98%   BMI 18.66 kg/m  SpO2: SpO2: 98 % O2 Device: O2 Device: Nasal Cannula O2 Flow Rate: O2 Flow Rate (L/min): 2 L/min  Intake/output summary:  Intake/Output Summary (Last 24 hours) at 10/30/15 1304 Last data filed at 10/30/15 1204  Gross per 24 hour  Intake              120 ml  Output             2150 ml  Net            -2030 ml   LBM: Last BM Date: 10/29/15 Baseline Weight: Weight: 45.4 kg (100 lb 2 oz) Most recent weight: Weight: 49.3 kg (108 lb 11.2 oz)       Palliative Assessment/Data: 40 % at best      Patient Active Problem List   Diagnosis Date Noted  . Palliative care by specialist 10/25/2015  . Acute respiratory failure with hypoxia (HCC) 10/24/2015  . Septic shock (HCC) 10/24/2015  . Hyperkalemia 10/24/2015  . Medication adverse effect 10/24/2015  . Drug-induced bradycardia 10/24/2015  . Pacer at end of battery life 10/24/2015  . Acute renal failure (HCC) 10/24/2015  . Cholecystostomy care (HCC) 09/05/2015  . Pneumonia 06/13/2015  . DNR (do not resuscitate) 04/06/2015  . Malnutrition of moderate degree  03/26/2015  . Insomnia 02/20/2015  . Macular degeneration 02/20/2015  . Chronic pain syndrome 02/20/2015  . Screening for breast cancer 02/20/2015  . Dyspnea   . Dry eyes 12/14/2014  . Posterior vitreous detachment of both eyes 12/14/2014  . Glaucoma 11/22/2014  . Protein-calorie malnutrition, severe (HCC) 06/01/2014  . Acute on chronic congestive heart failure (HCC) 05/31/2014  . CHF (congestive heart failure), NYHA class I (HCC) 05/31/2014  . Cardiac pacemaker in situ 03/10/2014  . HLD (hyperlipidemia) 03/10/2014  . Nonexudative age-related macular degeneration 12/01/2013  . Status post intraocular lens implant 12/01/2013  . Pulmonary HTN 06/09/2013  . Nocturnal hypoxemia 04/20/2013  . Depression 06/23/2011  . Acute on chronic diastolic CHF (congestive heart failure) (HCC) 11/13/2010  . Pleural effusion 10/01/2010  . Chronic anticoagulation 07/10/2010  . Hypothyroidism 04/24/2010  . Essential hypertension, benign 03/01/2010  . ATRIAL FIBRILLATION, CHRONIC 03/01/2010  . SSS (sick sinus syndrome) (HCC) 03/01/2010    Palliative Care Assessment & Plan   Patient Profile:  82 y.o. female   admitted on 10/24/2015 with a known history of CHF, COPD,  cholecystitis, atrial fibrillation, bradycardia  dysrhythmia status post pacemaker.   She presented to the ED with worsening generalized weakness and shortness of breath several days.   She was found in respiratory failure, SAT was in the 80s on room air which improved to the upper 80s on a non-rebreather. She was put on BiPAP in the ED.   BNP elevated at 1549, chest x-ray concerning for pulmonary edema. She was treated with Lasix 40 mg 1 dose.  She also has bradycardia base heart rate 30-40's. Dr. Don Perking spoke with cardiology and patient's pacer is not working. Pacers battery is now exhausted and patient's battery was not replaced due to her poor health, chronic comorbidities, risk of surgery, and  patient's wishes.   She has  progressed and is ready for discharge today, family requesting another conversation to clarify and establish GOC   Recommendations/Plan:  Discharge to facility for rehabilitation   Goals of Care and Additional Recommendations:  Limitations on Scope of Treatment: Avoid Hospitalization  Code Status:    Code Status Orders        Start     Ordered   10/29/15 1646  Do not attempt resuscitation (DNR)  Continuous    Question Answer Comment  In the event of cardiac or respiratory ARREST Do not call a "code blue"   In the event of cardiac or respiratory ARREST Do not perform Intubation, CPR, defibrillation or ACLS   In the event of cardiac or respiratory ARREST Use medication by any route, position, wound care, and other measures to relive pain and suffering. May use oxygen, suction and manual treatment of airway obstruction as needed for comfort.      10/29/15 1646    Code Status History    Date Active Date Inactive Code Status Order ID Comments User Context   10/24/2015 12:47 PM 10/24/2015 12:53 PM DNR [id number]  Shaune PollackQing Chen, MD Inpatient   06/13/2015  8:09 PM 06/17/2015  3:00 PM DNR [id number]  Enedina FinnerSona Patel, MD Inpatient   03/25/2015 11:04 AM 03/29/2015  5:19 PM Full Code [id number]  Ramonita LabAruna Gouru, MD ED   03/25/2015 11:03 AM 03/25/2015 11:04 AM DNR [id number]  Ramonita LabAruna Gouru, MD ED   10/17/2014  9:34 PM 10/24/2014  6:56 PM Full Code [id number]  Ricarda Frameharles Woodham, MD Inpatient   05/31/2014  4:55 PM 06/04/2014  5:10 PM DNR [id number]  Ramonita LabAruna Gouru, MD Inpatient    Advance Directive Documentation   Flowsheet Row Most Recent Value  Type of Advance Directive  Living will, Healthcare Power of Attorney  Pre-existing out of facility DNR order (yellow form or pink MOST form)  No data  "MOST" Form in Place?  No data       Prognosis:   < 6 months  Discharge Planning:  Skilled Nursing Facility for rehab with Palliative care service follow-up    Thank you for allowing the Palliative Medicine Team to assist in  the care of this patient.   Time In: 0900 Time Out: 1000 Total Time 60 min Prolonged Time Billed  no       Greater than 50%  of this time was spent counseling and coordinating care related to the above assessment and plan.  Lorinda CreedLARACH, Liahna Brickner, NP  Please contact Palliative Medicine Team phone at [phone] for questions and concerns.

## 2015-10-30 NOTE — Progress Notes (Signed)
ANTICOAGULATION CONSULT NOTE - Follow Up Consult  Pharmacy Consult for warfarin dosing Indication: atrial fibrillation  Allergies  Allergen Reactions  . Amoxicillin Other (See Comments)    Reaction:  Weakness Has patient had a PCN reaction causing immediate rash, facial/tongue/throat swelling, SOB or lightheadedness with hypotension: No Has patient had a PCN reaction causing severe rash involving mucus membranes or skin necrosis: No Has patient had a PCN reaction that required hospitalization No Has patient had a PCN reaction occurring within the last 10 years: No If all of the above answers are "NO", then may proceed with Cephalosporin use.  . Cetylpyridinium Other (See Comments)    Reaction:  Weakness   . Digoxin Other (See Comments)    Reaction:  Weakness and dehydration  . Levaquin [Levofloxacin] Itching  . Risedronate Sodium Rash    Patient Measurements: Height: 5\' 4"  (162.6 cm) Weight: 108 lb 11.2 oz (49.3 kg) IBW/kg (Calculated) : 54.7  Vital Signs: Temp: 97.6 F (36.4 C) (10/03 0422) Temp Source: Oral (10/03 0422) BP: 159/90 (10/03 0422) Pulse Rate: 88 (10/03 0422)  Labs:  Recent Labs  10/28/15 0433 10/29/15 0412 10/30/15 0633  HGB  --   --  12.8  HCT  --   --  37.3  PLT  --   --  143*  LABPROT 21.7* 18.2* 17.4*  INR 1.86 1.49 1.41  CREATININE 0.91 0.82  --     Estimated Creatinine Clearance: 35.5 mL/min (by C-G formula based on SCr of 0.82 mg/dL).   Medications:  Scheduled:  . atorvastatin  20 mg Oral QHS  . chlorhexidine  15 mL Mouth Rinse BID  . docusate sodium  100 mg Oral BID  . timolol  1 drop Both Eyes BID   And  . dorzolamide  1 drop Both Eyes BID  . feeding supplement (ENSURE ENLIVE)  237 mL Oral BID WC  . furosemide  20 mg Intravenous BID  . latanoprost  1 drop Both Eyes QHS  . levothyroxine  112 mcg Oral Daily  . potassium chloride  20 mEq Oral BID  . sodium chloride flush  3 mL Intravenous Q12H  . warfarin  5 mg Oral ONCE-1800  .  Warfarin - Pharmacist Dosing Inpatient   Does not apply q1800    Assessment: Pharmacy consulted to dose and monitor warfarin in this 82 year old female who was taking warfarin prior to admission for atrial fibrillation.  Pt and nursing home report pt is taking 5 mg daily of warfarin for afib (INR goal 2-3). Pt's pacemaker batteries have exhausted and they will not be replaced.  INR = 1.49 today which is subtherapeutic  Dosing history: Date INR Dose 9/27 1.94 5 mg 9/28 4.65 Hold 9/29 5.76 Hold 9/30 3.44 Hold 10/1 1.86 4 mg 10/2 1.49 4 mg 10/3 1.41 5 mg  Goal of Therapy:  INR 2-3 Monitor platelets by anticoagulation protocol: Yes   Plan:  10/3 Per note, pt will be discharged today and will receive warfarin 5 mg PO. Will order warfarin 5 mg PO tonight to mimic what pt is being discharged with. 10/2 Per MD - no anticoagulation bridge for subtherapeutic INR  Will obtain INR with AM labs.    Pharmacy will continue to monitor and adjust per consult.   Horris LatinoHolly Tyquez Hollibaugh, PharmD Pharmacy Resident 10/30/2015 1:51 PM

## 2015-10-30 NOTE — Progress Notes (Addendum)
CSW met with patient and her son. Agreement to discharge to [facility] today 10/30/15. Updated Kim- Admissions Coordinator at [facility].   Clinical Social Worker was informed that patient will be medically ready to discharge to [place]. Patient and son- Dominica Severin are in a agreement with plan. CSW called Maudie Mercury- Admissions Coordinator at [facility] to confirm that patient's bed is ready. Provided patient's room number 340 and number to call for report [phone] . All discharge information faxed to [facility] via [place]. DNR added to discharge packet.Referral made to [place] to follow.   Call to patient's son- Dominica Severin, to inform her patient would discharge to [facility]. RN will call report and patient will discharge to [facility] via EMS  Ernest Pine, MSW, [place], [place] Social Worker [phone]

## 2015-10-31 ENCOUNTER — Telehealth: Payer: Self-pay

## 2015-10-31 NOTE — Telephone Encounter (Signed)
Unable to reach patient. No answer.  Will continue to follow as appropriate. 

## 2015-11-01 ENCOUNTER — Telehealth: Payer: Self-pay

## 2015-11-01 LAB — PROTIME-INR
INR: 1.47
PROTHROMBIN TIME: 18 s — AB (ref 11.4–15.2)

## 2015-11-01 NOTE — Telephone Encounter (Signed)
Discharge note states [facility] to be [facility].  Called home number to confirm.  Spoke to husband (HIPPA compliant).  He states she is not home, doing ok and will call the office soon to make an appointment.

## 2015-11-02 ENCOUNTER — Ambulatory Visit: Admission: RE | Admit: 2015-11-02 | Payer: Medicare Other | Source: Ambulatory Visit

## 2015-11-03 ENCOUNTER — Emergency Department: Payer: Medicare Other

## 2015-11-03 ENCOUNTER — Encounter: Payer: Self-pay | Admitting: Emergency Medicine

## 2015-11-03 ENCOUNTER — Inpatient Hospital Stay
Admission: EM | Admit: 2015-11-03 | Discharge: 2015-11-06 | DRG: 082 | Disposition: A | Discharge status: Skilled Nursing Facility | Payer: Medicare Other | Attending: Specialist | Admitting: Specialist

## 2015-11-03 ENCOUNTER — Inpatient Hospital Stay: Payer: Medicare Other

## 2015-11-03 DIAGNOSIS — J9601 Acute respiratory failure with hypoxia: Secondary | ICD-10-CM | POA: Diagnosis present

## 2015-11-03 DIAGNOSIS — Z9889 Other specified postprocedural states: Secondary | ICD-10-CM | POA: Diagnosis not present

## 2015-11-03 DIAGNOSIS — Z823 Family history of stroke: Secondary | ICD-10-CM

## 2015-11-03 DIAGNOSIS — S065XAA Traumatic subdural hemorrhage with loss of consciousness status unknown, initial encounter: Secondary | ICD-10-CM | POA: Diagnosis present

## 2015-11-03 DIAGNOSIS — R791 Abnormal coagulation profile: Secondary | ICD-10-CM | POA: Diagnosis present

## 2015-11-03 DIAGNOSIS — Z66 Do not resuscitate: Secondary | ICD-10-CM | POA: Diagnosis present

## 2015-11-03 DIAGNOSIS — Z8249 Family history of ischemic heart disease and other diseases of the circulatory system: Secondary | ICD-10-CM | POA: Diagnosis not present

## 2015-11-03 DIAGNOSIS — R0602 Shortness of breath: Secondary | ICD-10-CM

## 2015-11-03 DIAGNOSIS — Z90710 Acquired absence of both cervix and uterus: Secondary | ICD-10-CM | POA: Diagnosis not present

## 2015-11-03 DIAGNOSIS — W19XXXA Unspecified fall, initial encounter: Secondary | ICD-10-CM | POA: Diagnosis present

## 2015-11-03 DIAGNOSIS — I5032 Chronic diastolic (congestive) heart failure: Secondary | ICD-10-CM | POA: Diagnosis present

## 2015-11-03 DIAGNOSIS — Z8049 Family history of malignant neoplasm of other genital organs: Secondary | ICD-10-CM | POA: Diagnosis not present

## 2015-11-03 DIAGNOSIS — Z950 Presence of cardiac pacemaker: Secondary | ICD-10-CM

## 2015-11-03 DIAGNOSIS — I272 Pulmonary hypertension, unspecified: Secondary | ICD-10-CM | POA: Diagnosis present

## 2015-11-03 DIAGNOSIS — S065X9A Traumatic subdural hemorrhage with loss of consciousness of unspecified duration, initial encounter: Secondary | ICD-10-CM | POA: Diagnosis present

## 2015-11-03 DIAGNOSIS — Y92129 Unspecified place in nursing home as the place of occurrence of the external cause: Secondary | ICD-10-CM

## 2015-11-03 DIAGNOSIS — E039 Hypothyroidism, unspecified: Secondary | ICD-10-CM | POA: Diagnosis present

## 2015-11-03 DIAGNOSIS — I110 Hypertensive heart disease with heart failure: Secondary | ICD-10-CM | POA: Diagnosis present

## 2015-11-03 DIAGNOSIS — Z888 Allergy status to other drugs, medicaments and biological substances status: Secondary | ICD-10-CM | POA: Diagnosis not present

## 2015-11-03 DIAGNOSIS — Z7901 Long term (current) use of anticoagulants: Secondary | ICD-10-CM

## 2015-11-03 DIAGNOSIS — Z801 Family history of malignant neoplasm of trachea, bronchus and lung: Secondary | ICD-10-CM

## 2015-11-03 DIAGNOSIS — Z86010 Personal history of colonic polyps: Secondary | ICD-10-CM

## 2015-11-03 DIAGNOSIS — Z880 Allergy status to penicillin: Secondary | ICD-10-CM | POA: Diagnosis not present

## 2015-11-03 DIAGNOSIS — I4891 Unspecified atrial fibrillation: Secondary | ICD-10-CM | POA: Diagnosis present

## 2015-11-03 DIAGNOSIS — E785 Hyperlipidemia, unspecified: Secondary | ICD-10-CM | POA: Diagnosis present

## 2015-11-03 DIAGNOSIS — H409 Unspecified glaucoma: Secondary | ICD-10-CM | POA: Diagnosis present

## 2015-11-03 DIAGNOSIS — Z833 Family history of diabetes mellitus: Secondary | ICD-10-CM

## 2015-11-03 DIAGNOSIS — Z79899 Other long term (current) drug therapy: Secondary | ICD-10-CM

## 2015-11-03 DIAGNOSIS — I6200 Nontraumatic subdural hemorrhage, unspecified: Secondary | ICD-10-CM | POA: Diagnosis present

## 2015-11-03 DIAGNOSIS — R0902 Hypoxemia: Secondary | ICD-10-CM

## 2015-11-03 DIAGNOSIS — M6281 Muscle weakness (generalized): Secondary | ICD-10-CM

## 2015-11-03 DIAGNOSIS — Z9181 History of falling: Secondary | ICD-10-CM

## 2015-11-03 DIAGNOSIS — R262 Difficulty in walking, not elsewhere classified: Secondary | ICD-10-CM

## 2015-11-03 DIAGNOSIS — Z9981 Dependence on supplemental oxygen: Secondary | ICD-10-CM

## 2015-11-03 LAB — BASIC METABOLIC PANEL
ANION GAP: 4 — AB (ref 5–15)
BUN: 38 mg/dL — ABNORMAL HIGH (ref 6–20)
CALCIUM: 8.6 mg/dL — AB (ref 8.9–10.3)
CO2: 29 mmol/L (ref 22–32)
Chloride: 106 mmol/L (ref 101–111)
Creatinine, Ser: 0.78 mg/dL (ref 0.44–1.00)
GFR calc Af Amer: >60 mL/min (ref >60)
GFR calc non Af Amer: >60 mL/min (ref >60)
GLUCOSE: 132 mg/dL — AB (ref 65–99)
POTASSIUM: 3.7 mmol/L (ref 3.5–5.1)
Sodium: 139 mmol/L (ref 135–145)

## 2015-11-03 LAB — CBC WITH DIFFERENTIAL/PLATELET
BASOS ABS: 0 10*3/uL (ref 0–0.1)
BASOS PCT: 1 %
EOS ABS: 0.1 10*3/uL (ref 0–0.7)
Eosinophils Relative: 1 %
HEMATOCRIT: 37.2 % (ref 35.0–47.0)
Hemoglobin: 12.8 g/dL (ref 12.0–16.0)
LYMPHS PCT: 5 %
Lymphs Abs: 0.4 10*3/uL — ABNORMAL LOW (ref 1.0–3.6)
MCH: 31.4 pg (ref 26.0–34.0)
MCHC: 34.3 g/dL (ref 32.0–36.0)
MCV: 91.6 fL (ref 80.0–100.0)
MONO ABS: 0.6 10*3/uL (ref 0.2–0.9)
MONOS PCT: 7 %
NEUTROS ABS: 7.3 10*3/uL — AB (ref 1.4–6.5)
Neutrophils Relative %: 86 %
PLATELETS: 145 10*3/uL — AB (ref 150–440)
RBC: 4.07 MIL/uL (ref 3.80–5.20)
RDW: 15.1 % — AB (ref 11.5–14.5)
WBC: 8.5 10*3/uL (ref 3.6–11.0)

## 2015-11-03 LAB — URINALYSIS COMPLETE WITH MICROSCOPIC (ARMC ONLY)
BILIRUBIN URINE: NEGATIVE
Bacteria, UA: NONE SEEN
GLUCOSE, UA: NEGATIVE mg/dL
HGB URINE DIPSTICK: NEGATIVE
KETONES UR: NEGATIVE mg/dL
Leukocytes, UA: NEGATIVE
NITRITE: NEGATIVE
Protein, ur: 100 mg/dL — AB
SPECIFIC GRAVITY, URINE: 1.015 (ref 1.005–1.030)
Squamous Epithelial / LPF: NONE SEEN
pH: 7 (ref 5.0–8.0)

## 2015-11-03 LAB — PROTIME-INR
INR: 1.59
Prothrombin Time: 19.1 seconds — ABNORMAL HIGH (ref 11.4–15.2)

## 2015-11-03 LAB — TROPONIN I

## 2015-11-03 MED ORDER — FENTANYL CITRATE (PF) 100 MCG/2ML IJ SOLN
INTRAMUSCULAR | Status: AC
  Administered 2015-11-03: 50 ug via INTRAVENOUS
  Filled 2015-11-03: qty 2

## 2015-11-03 MED ORDER — ATORVASTATIN CALCIUM 20 MG PO TABS
20.0000 mg | ORAL_TABLET | Freq: Every day | ORAL | Status: DC
Start: 2015-11-03 — End: 2015-11-06
  Administered 2015-11-03 – 2015-11-05 (×3): 20 mg via ORAL
  Filled 2015-11-03 (×3): qty 1

## 2015-11-03 MED ORDER — ONDANSETRON HCL 4 MG/2ML IJ SOLN: 4.0000 mg | Freq: Four times a day (QID) | INTRAMUSCULAR | Status: DC | PRN

## 2015-11-03 MED ORDER — ONDANSETRON HCL 4 MG PO TABS: 4.0000 mg | ORAL_TABLET | Freq: Four times a day (QID) | ORAL | Status: DC | PRN

## 2015-11-03 MED ORDER — FENTANYL CITRATE (PF) 100 MCG/2ML IJ SOLN
50.0000 ug | Freq: Once | INTRAMUSCULAR | Status: AC
  Administered 2015-11-03: 50 ug via INTRAVENOUS

## 2015-11-03 MED ORDER — ONDANSETRON HCL 4 MG/2ML IJ SOLN
4.0000 mg | Freq: Once | INTRAMUSCULAR | Status: AC
  Administered 2015-11-03: 4 mg via INTRAVENOUS
  Filled 2015-11-03: qty 2

## 2015-11-03 MED ORDER — ACETAMINOPHEN 650 MG RE SUPP: 650.0000 mg | Freq: Four times a day (QID) | RECTAL | Status: DC | PRN

## 2015-11-03 MED ORDER — SODIUM CHLORIDE 0.9% FLUSH: 3.0000 mL | INTRAVENOUS | Status: DC | PRN

## 2015-11-03 MED ORDER — POLYETHYLENE GLYCOL 3350 17 G PO PACK
17.0000 g | PACK | Freq: Every day | ORAL | Status: DC
  Administered 2015-11-03 – 2015-11-05 (×3): 17 g via ORAL
  Filled 2015-11-03 (×3): qty 1

## 2015-11-03 MED ORDER — HYDROCODONE-ACETAMINOPHEN 5-325 MG PO TABS
1.0000 | ORAL_TABLET | ORAL | Status: DC | PRN
  Administered 2015-11-04 (×4): 1 via ORAL
  Administered 2015-11-05 – 2015-11-06 (×4): 2 via ORAL
  Filled 2015-11-03 (×3): qty 2
  Filled 2015-11-03: qty 1
  Filled 2015-11-03: qty 2
  Filled 2015-11-03 (×3): qty 1

## 2015-11-03 MED ORDER — LORAZEPAM 0.5 MG PO TABS
0.2500 mg | ORAL_TABLET | Freq: Every day | ORAL | Status: DC
Start: 2015-11-03 — End: 2015-11-06
  Administered 2015-11-03 – 2015-11-05 (×3): 0.25 mg via ORAL
  Filled 2015-11-03 (×3): qty 1

## 2015-11-03 MED ORDER — MORPHINE SULFATE (PF) 2 MG/ML IV SOLN
1.0000 mg | Freq: Once | INTRAVENOUS | Status: AC
  Administered 2015-11-03: 1 mg via INTRAVENOUS
  Filled 2015-11-03: qty 1

## 2015-11-03 MED ORDER — IPRATROPIUM-ALBUTEROL 0.5-2.5 (3) MG/3ML IN SOLN
3.0000 mL | Freq: Four times a day (QID) | RESPIRATORY_TRACT | Status: DC
  Administered 2015-11-03 – 2015-11-04 (×2): 3 mL via RESPIRATORY_TRACT
  Filled 2015-11-03 (×4): qty 3

## 2015-11-03 MED ORDER — FENTANYL CITRATE (PF) 100 MCG/2ML IJ SOLN
50.0000 ug | Freq: Once | INTRAMUSCULAR | Status: AC
  Administered 2015-11-03: 50 ug via INTRAVENOUS
  Filled 2015-11-03: qty 2

## 2015-11-03 MED ORDER — SODIUM CHLORIDE 0.9% FLUSH
3.0000 mL | Freq: Two times a day (BID) | INTRAVENOUS | Status: DC
  Administered 2015-11-03 – 2015-11-06 (×7): 3 mL via INTRAVENOUS

## 2015-11-03 MED ORDER — LEVOTHYROXINE SODIUM 112 MCG PO TABS
112.0000 ug | ORAL_TABLET | Freq: Every day | ORAL | Status: DC
  Administered 2015-11-04 – 2015-11-06 (×3): 112 ug via ORAL
  Filled 2015-11-03 (×3): qty 1

## 2015-11-03 MED ORDER — DOCUSATE SODIUM 100 MG PO CAPS
100.0000 mg | ORAL_CAPSULE | Freq: Two times a day (BID) | ORAL | Status: DC
  Administered 2015-11-03 – 2015-11-06 (×6): 100 mg via ORAL
  Filled 2015-11-03 (×7): qty 1

## 2015-11-03 MED ORDER — ACETAMINOPHEN 325 MG PO TABS: 650.0000 mg | ORAL_TABLET | Freq: Four times a day (QID) | ORAL | Status: DC | PRN

## 2015-11-03 MED ORDER — HYDRALAZINE HCL 20 MG/ML IJ SOLN: 10.0000 mg | Freq: Four times a day (QID) | INTRAMUSCULAR | Status: DC | PRN

## 2015-11-03 MED ORDER — SODIUM CHLORIDE 0.9 % IV SOLN: 250.0000 mL | INTRAVENOUS | Status: DC | PRN

## 2015-11-03 MED ORDER — MORPHINE SULFATE (PF) 2 MG/ML IV SOLN
1.0000 mg | Freq: Once | INTRAVENOUS | Status: DC
  Filled 2015-11-03: qty 1

## 2015-11-03 MED ORDER — DORZOLAMIDE HCL-TIMOLOL MAL 2-0.5 % OP SOLN
1.0000 [drp] | Freq: Two times a day (BID) | OPHTHALMIC | Status: DC
  Administered 2015-11-03 – 2015-11-06 (×6): 1 [drp] via OPHTHALMIC
  Filled 2015-11-03: qty 10

## 2015-11-03 MED ORDER — LATANOPROST 0.005 % OP SOLN
1.0000 [drp] | Freq: Every day | OPHTHALMIC | Status: DC
  Administered 2015-11-03 – 2015-11-05 (×3): 1 [drp] via OPHTHALMIC
  Filled 2015-11-03: qty 2.5

## 2015-11-03 MED ORDER — FUROSEMIDE 20 MG PO TABS
20.0000 mg | ORAL_TABLET | Freq: Two times a day (BID) | ORAL | Status: DC
  Administered 2015-11-03 – 2015-11-04 (×2): 20 mg via ORAL
  Filled 2015-11-03 (×2): qty 1

## 2015-11-03 MED ORDER — VITAMIN D (ERGOCALCIFEROL) 1.25 MG (50000 UNIT) PO CAPS: 50000.0000 [IU] | ORAL_CAPSULE | ORAL | Status: DC

## 2015-11-03 MED ORDER — SENNOSIDES-DOCUSATE SODIUM 8.6-50 MG PO TABS: 1.0000 | ORAL_TABLET | Freq: Every evening | ORAL | Status: DC | PRN

## 2015-11-03 MED ORDER — SODIUM CHLORIDE 0.9% FLUSH
3.0000 mL | Freq: Two times a day (BID) | INTRAVENOUS | Status: DC
  Administered 2015-11-03 – 2015-11-06 (×7): 3 mL via INTRAVENOUS

## 2015-11-03 MED ORDER — IPRATROPIUM-ALBUTEROL 0.5-2.5 (3) MG/3ML IN SOLN: 3.0000 mL | Freq: Four times a day (QID) | RESPIRATORY_TRACT | Status: DC

## 2015-11-03 MED ORDER — LABETALOL HCL 5 MG/ML IV SOLN
INTRAVENOUS | Status: AC
  Administered 2015-11-03: 20 mg via INTRAVENOUS
  Filled 2015-11-03: qty 4

## 2015-11-03 MED ORDER — MORPHINE SULFATE (PF) 2 MG/ML IV SOLN
2.0000 mg | INTRAVENOUS | Status: DC | PRN
  Administered 2015-11-03 – 2015-11-04 (×3): 2 mg via INTRAVENOUS
  Filled 2015-11-03 (×3): qty 1

## 2015-11-03 MED ORDER — LABETALOL HCL 5 MG/ML IV SOLN
20.0000 mg | Freq: Once | INTRAVENOUS | Status: AC
  Administered 2015-11-03: 20 mg via INTRAVENOUS

## 2015-11-03 MED ORDER — ENSURE ENLIVE PO LIQD
237.0000 mL | Freq: Two times a day (BID) | ORAL | Status: DC
  Administered 2015-11-03 – 2015-11-06 (×6): 237 mL via ORAL

## 2015-11-03 MED ORDER — POTASSIUM CHLORIDE CRYS ER 10 MEQ PO TBCR
10.0000 meq | EXTENDED_RELEASE_TABLET | Freq: Two times a day (BID) | ORAL | Status: DC
  Administered 2015-11-03 – 2015-11-06 (×6): 10 meq via ORAL
  Filled 2015-11-03 (×8): qty 1

## 2015-11-03 NOTE — Progress Notes (Signed)
Patient does not want to sign a consent for blood products at this time. Bo McclintockBrewer,Chesney Suares S, RN

## 2015-11-03 NOTE — ED Notes (Signed)
Pt noted to be 85% on 4L oxygen upon returning from CT scan, placed on non-rebreather. MD made aware

## 2015-11-03 NOTE — ED Triage Notes (Signed)
[facility] after an unwitnessed fall. Staff found the pt in the floor around 8:30 this morning. Pt/staff deny LOC. Pt has c/o of lower back and sacrum pain and bruising to fingers on both hands with the left hand having significant bruising. Pt also presents to ED with skin tears to both elbows that were addressed by [facility] prior to leaving the facility. Pt is on blood thinner med.

## 2015-11-03 NOTE — H&P (Signed)
[facility] - [place] [facility]   PATIENT NAME: Laurie Larsen    MR#:  [id number]  DATE OF BIRTH:  [birth year]  DATE OF ADMISSION:  11/03/2015  PRIMARY CARE PHYSICIAN: Sherlene Shams, MD   REQUESTING/REFERRING PHYSICIAN: Governor Rooks M.D.  CHIEF COMPLAINT:   Chief Complaint  Patient presents with  . Fall    HISTORY OF PRESENT ILLNESS: Laurie Larsen  is a 82 y.o. female with a known history of Diastolic CHF,  atrial fibrillation on chronic Coumadin therapy who was recently admitted with acute respiratory failure due to acute CHF as well as bradycardia. Patient was discharged to a skilled nursing facility earlier today she try to get up by herself and fell backwards and hit her head. Patient was noted to have subdural hematoma. The ER physician tried calling [facility] as well as [facility] and [facility]. She was told that the beds were not currently available. The neurosurgeon at [facility] stated that patient would not be a good candidate for surgery. She presented this to the family. They did not want her to be transferred. They do not want her to have any surgery. They do understand that there is no neurosurgery here. Patient also currently on full facemask. She is hard to understand but is awake. PAST MEDICAL HISTORY:   Past Medical History:  Diagnosis Date  . Acalculous cholecystitis   . Allergy   . Arthritis   . Back complaints   . Bronchitis   . CAD (coronary artery disease)    a. 2005 Cath: 100% dLCX-->Med managed.  . CHF (congestive heart failure) (HCC)   . Chronic diastolic CHF (congestive heart failure), NYHA class 1 (HCC)    a. 04/2013 Echo: EF 50-55%, triv MR, mildly dil LA, PASP , mild TR, mild-mod RA dil. b. echo 02/2015: EF 55-60%, mild MR, mild-mod TR, PA peak pressure 72 mm Hg  . Chronic pain    a. ? due to arthritis  . Chronic Right Pleural Effusion    a. 07/2012 s/p thoracentesis - Followed by Dr. Delford Field [facility]. b. 02/2015: s/p  repeat R thoracentesis  . Colon polyps   . Colon polyps   . Diverticulitis large intestine   . Glaucoma   . Hay fever   . History of blood transfusion   . HTN (hypertension)   . Hyperlipidemia   . Hypothyroidism   . Permanent atrial fibrillation (HCC)    a. Chronic Coumadin.  [name] Phlebitis    a. after pacemeker placement in 2010.  . Pulmonary HTN    a. PASP on echo 04/2013. b. PASP 72 mmHg on echo in 02/2015  . Rash/skin eruption    a. Multiple episodes - wide distribution-intermittent, possibly drug rash.  . Sick sinus syndrome (HCC)    a. 05/2008 s/p MDT EnRhythm DC PPM ([facility]).  . Transfusion history    a. After childbirth 60 years ago (1957)  . Urine incontinence     PAST SURGICAL HISTORY: Past Surgical History:  Procedure Laterality Date  . ABDOMINAL HYSTERECTOMY  1977  . ANKLE FRACTURE SURGERY  1979   Left, pin  . APPENDECTOMY  1939  . BREAST BIOPSY  2011  . BREAST FIBROADENOMA SURGERY  2010   Right  . BREAST SURGERY     Needle guided excision, right breast calcification  . IR GENERIC HISTORICAL  10/19/2015   IR CHOLANGIOGRAM EXISTING TUBE 10/19/2015 Berdine Dance, MD [facility]-INTERV RAD  . KIDNEY SURGERY  1955   Right  .  PACEMAKER INSERTION  06/20/08   MDT EnRhythm DR implanted by Dr Reyes Ivan  . SKIN GRAFT     left leg  . TONSILLECTOMY  82 yrs old    SOCIAL HISTORY:  Social History  Substance Use Topics  . Smoking status: Never Smoker  . Smokeless tobacco: Never Used  . Alcohol use No    FAMILY HISTORY:  Family History  Problem Relation Age of Onset  . Cancer Mother     colon with mets  . Diabetes Mother   . Heart disease Father   . Hyperlipidemia Father   . Hypertension Father   . Heart attack Father   . Stroke Brother     brother who died 04-Jul-2022  . Heart attack Brother   . Cancer Paternal Aunt     breast  . Cancer Cousin     uterine, lung cancer    DRUG ALLERGIES:  Allergies  Allergen Reactions  . Amoxicillin Other (See Comments)     Reaction:  Weakness Has patient had a PCN reaction causing immediate rash, facial/tongue/throat swelling, SOB or lightheadedness with hypotension: No Has patient had a PCN reaction causing severe rash involving mucus membranes or skin necrosis: No Has patient had a PCN reaction that required hospitalization No Has patient had a PCN reaction occurring within the last 10 years: No If all of the above answers are "NO", then may proceed with Cephalosporin use.  . Cetylpyridinium Other (See Comments)    Reaction:  Weakness   . Digoxin Other (See Comments)    Reaction:  Weakness and dehydration  . Levaquin [Levofloxacin] Itching  . Risedronate Sodium Rash    REVIEW OF SYSTEMS:   CONSTITUTIONAL: Unable to provide due to patient's mental status   MEDICATIONS AT HOME:  Prior to Admission medications   Medication Sig Start Date End Date Taking? Authorizing Provider  atorvastatin (LIPITOR) 20 MG tablet Take 1 tablet (20 mg total) by mouth at bedtime. 08/28/15  Yes Ellsworth Lennox, PA  docusate sodium (COLACE) 100 MG capsule Take 1 capsule (100 mg total) by mouth 2 (two) times daily. 03/29/15  Yes Ramonita Lab, MD  dorzolamide-timolol (COSOPT) 22.3-6.8 MG/ML ophthalmic solution Place 1 drop into both eyes 2 (two) times daily.    Yes Historical Provider, MD  furosemide (LASIX) 20 MG tablet Take 1 tablet (20 mg total) by mouth 2 (two) times daily. 08/28/15  Yes Ellsworth Lennox, PA  HYDROcodone-acetaminophen (NORCO) 10-325 MG tablet Take 1 tablet by mouth 3 (three) times daily as needed for moderate pain or severe pain. 10/30/15  Yes Katha Hamming, MD  ipratropium-albuterol (DUONEB) 0.5-2.5 (3) MG/3ML SOLN Take 3 mLs by nebulization every 6 (six) hours. 03/29/15  Yes Ramonita Lab, MD  levothyroxine (SYNTHROID, LEVOTHROID) 112 MCG tablet Take 1 tablet (112 mcg total) by mouth daily. 07/09/15  Yes Shelia Media, MD  LORazepam (ATIVAN) 0.5 MG tablet Take 0.5 tablets (0.25 mg total) by mouth at  bedtime. 10/30/15  Yes Katha Hamming, MD  polyethylene glycol (MIRALAX / GLYCOLAX) packet Take 17 g by mouth at bedtime.    Yes Historical Provider, MD  potassium chloride (K-DUR) 10 MEQ tablet Take 1 tablet (10 mEq total) by mouth 2 (two) times daily. 08/28/15  Yes Ellsworth Lennox, PA  senna-docusate (SENOKOT-S) 8.6-50 MG tablet Take 1 tablet by mouth at bedtime as needed for mild constipation. 10/30/15  Yes Katha Hamming, MD  Travoprost, BAK Free, (TRAVATAN) 0.004 % SOLN ophthalmic solution Place 1 drop into both eyes at  bedtime.   Yes Historical Provider, MD  Vitamin D, Ergocalciferol, (DRISDOL) 50000 UNITS CAPS capsule Take 50,000 Units by mouth every 7 (seven) days. Pt takes on Saturday.   Yes Historical Provider, MD  warfarin (COUMADIN) 1 MG tablet Take 1 mg by mouth daily.   Yes Historical Provider, MD  warfarin (COUMADIN) 5 MG tablet Take 5 mg by mouth daily at 6 PM.   Yes Historical Provider, MD  acetaminophen (TYLENOL) 650 MG CR tablet Take 1,300 mg by mouth at bedtime as needed for pain.    Historical Provider, MD  feeding supplement, ENSURE ENLIVE, (ENSURE ENLIVE) LIQD Take 237 mLs by mouth 2 (two) times daily with a meal. 03/29/15   Ramonita Lab, MD  traMADol (ULTRAM) 50 MG tablet Take 1 tablet (50 mg total) by mouth 3 (three) times daily as needed for moderate pain. Reported on 06/20/2015 08/17/15   Shelia Media, MD      PHYSICAL EXAMINATION:   VITAL SIGNS: Blood pressure 132/88, pulse 94, temperature 97.8 F (36.6 C), temperature source Oral, resp. rate 20, height 5\' 4"  (1.626 m), weight 108 lb (49 kg), SpO2 96 %.  GENERAL:  82 y.o.-year-old patient lying in the bed  Has full facemask EYES: Pupils equal, round, reactive to light and accommodation. No scleral icterus.  HEENT: Head atraumatic, normocephalic. Oropharynx and nasopharynx clear.  NECK:  Supple, no jugular venous distention. No thyroid enlargement, no tenderness.  LUNGS: Normal breath sounds bilaterally, no  wheezing, rales,rhonchi or crepitation. No use of accessory muscles of respiration.  CARDIOVASCULAR: S1, S2 normal. No murmurs, rubs, or gallops.  ABDOMEN: Soft, nontender, nondistended. Bowel sounds present. No organomegaly or mass.  EXTREMITIES: No pedal edema, cyanosis, or clubbing.  NEUROLOGIC: Patient able to spontaneously move all extremities. Cranial nerves II-12 grossly intact PSYCHIATRIC: The patient is alert and oriented to person and place but not time  SKIN: No obvious rash, lesion, or ulcer.   LABORATORY PANEL:   CBC  Recent Labs Lab 10/30/15 0633 11/03/15 1052  WBC 7.5 8.5  HGB 12.8 12.8  HCT 37.3 37.2  PLT 143* 145*  MCV 90.5 91.6  MCH 30.9 31.4  MCHC 34.2 34.3  RDW 15.0* 15.1*  LYMPHSABS  --  0.4*  MONOABS  --  0.6  EOSABS  --  0.1  BASOSABS  --  0.0   ------------------------------------------------------------------------------------------------------------------  Chemistries   Recent Labs Lab 10/28/15 0433 10/29/15 0412 11/03/15 1052  NA 138 137 139  K 3.9 3.5 3.7  CL 100* 99* 106  CO2 30 30 29   GLUCOSE 90 105* 132*  BUN 45* 45* 38*  CREATININE 0.91 0.82 0.78  CALCIUM 8.2* 8.2* 8.6*   ------------------------------------------------------------------------------------------------------------------ estimated creatinine clearance is 36.2 mL/min (by C-G formula based on SCr of 0.78 mg/dL). ------------------------------------------------------------------------------------------------------------------ No results for input(s): TSH, T4TOTAL, T3FREE, THYROIDAB in the last 72 hours.  Invalid input(s): FREET3   Coagulation profile  Recent Labs Lab 10/28/15 0433 10/29/15 0412 10/30/15 0633 11/01/15 1825 11/03/15 1052  INR 1.86 1.49 1.41 1.47 1.59   ------------------------------------------------------------------------------------------------------------------- No results for input(s): DDIMER in the last 72  hours. -------------------------------------------------------------------------------------------------------------------  Cardiac Enzymes  Recent Labs Lab 11/03/15 1052  TROPONINI <0.03   ------------------------------------------------------------------------------------------------------------------ Invalid input(s): POCBNP  ---------------------------------------------------------------------------------------------------------------  Urinalysis    Component Value Date/Time   COLORURINE YELLOW (A) 11/03/2015 1353   APPEARANCEUR CLEAR (A) 11/03/2015 1353   APPEARANCEUR Clear 11/06/2013 1506   LABSPEC 1.015 11/03/2015 1353   LABSPEC 1.008 11/06/2013 1506   PHURINE 7.0 11/03/2015 1353  GLUCOSEU NEGATIVE 11/03/2015 1353   GLUCOSEU Negative 11/06/2013 1506   GLUCOSEU NEGATIVE 10/01/2010 1521   HGBUR NEGATIVE 11/03/2015 1353   BILIRUBINUR NEGATIVE 11/03/2015 1353   BILIRUBINUR Negative 11/06/2013 1506   KETONESUR NEGATIVE 11/03/2015 1353   PROTEINUR 100 (A) 11/03/2015 1353   UROBILINOGEN 0.2 10/08/2010 2145   NITRITE NEGATIVE 11/03/2015 1353   LEUKOCYTESUR NEGATIVE 11/03/2015 1353   LEUKOCYTESUR Negative 11/06/2013 1506     RADIOLOGY: Dg Thoracic Spine 2 View  Result Date: 11/03/2015 CLINICAL DATA:  Fall this morning with mid back pain and bruising in the upper back at roughly the T6 - 8 level. Initial encounter. EXAM: THORACIC SPINE 2 VIEWS COMPARISON:  Lateral view from a chest x-ray dated 08/17/2015 and frontal chest radiograph on 10/26/2015. FINDINGS: No acute fracture or subluxation identified. Bones are osteopenic and mild spondylosis present throughout the thoracic spine. There is minimal loss of height of the anterior aspect of the T11 vertebral body. No bony lesions identified. Frontal film shows opacity in the right lower chest which may relate to atelectasis. Component of infiltrate cannot be excluded. Degree of pleural fluid appears less prominent. IMPRESSION: No  acute thoracic fracture identified. Mild loss of height of T11. Probable atelectasis with some residual pleural fluid on the right. Cannot exclude right lower lung infiltrate. Electronically Signed   By: Irish Lack M.D.   On: 11/03/2015 12:25   Dg Lumbar Spine 2-3 Views  Result Date: 11/03/2015 CLINICAL DATA:  Fall this morning with mid back pain. Initial encounter. EXAM: LUMBAR SPINE - 2-3 VIEW COMPARISON:  CT of the abdomen and pelvis on 10/17/2014 FINDINGS: No acute fracture identified. Advanced lumbar spondylosis with associated degenerative scoliosis present. The most severe disc disease is at L3-4. There is mild loss of height of both L3 and L4 vertebral bodies. Bones are osteopenic. IMPRESSION: No acute fracture identified. Advanced spondylosis, scoliosis and mild loss of height at L3 and L4. Electronically Signed   By: Irish Lack M.D.   On: 11/03/2015 12:32   Ct Head Wo Contrast  Result Date: 11/03/2015 CLINICAL DATA:  82 year old female with a history of fall EXAM: CT HEAD WITHOUT CONTRAST CT CERVICAL SPINE WITHOUT CONTRAST TECHNIQUE: Multidetector CT imaging of the head and cervical spine was performed following the standard protocol without intravenous contrast. Multiplanar CT image reconstructions of the cervical spine were also generated. COMPARISON:  09/06/2011, chest CT 03/27/2015 FINDINGS: CT HEAD FINDINGS Brain: Acute left-sided mixed density subdural hemorrhage layered over the convexity extending from the apex over the frontal and parietal convexities. Coronal images demonstrate the greatest thickness measuring 1.6 cm posteriorly with associated mass effect. Greatest thickness anteriorly over the frontal lobe approximately 1 cm on coronal reformatted images. There is associated midline shift with approximately 6 mm of left-to-right shift at the foramen of Monro. Uniform thickness subdural layered along the falx, extending anteriorly into the interhemispheric region and  posteriorly along the left tentorium. Configuration the ventricles unchanged. Gray-white differentiation relatively maintained. Mild confluent hypodensity in the periventricular white matter. Vascular: Intracranial atherosclerosis. Skull: No fracture identified. Sinuses/Orbits: Left-sided mastoid air cell opacification. Left middle ear fluid. Other: None CT CERVICAL SPINE FINDINGS Alignment: Craniocervical junction aligned. Anatomic alignment of the cervical elements. No subluxation. Skull base and vertebrae: No acute fracture at the skullbase. Vertebral body heights relatively maintained. No acute fracture identified. Soft tissues and spinal canal: No canal hematoma. Dense calcifications of bilateral carotid arteries. Lobulated soft tissue associated with the epiglottis. This was present on a prior CT of  the chest 03/19/2015, now enlarged, measuring 13 mm. Disc levels: Disc space narrowing throughout the cervical spine, most pronounced at the level of C5-C6, C6-C7 where there is endplate sclerosis. Posterior disc osteophyte complex at C3-C4 and C7-T1. These posterior central disc osteophyte complex may contact the ventral surface of the cord. Upper chest: Unremarkable appearance of the lung apices. Other: Bilateral facet disease. IMPRESSION: Head CT: Acute mixed density left-sided subdural hemorrhage, involving the entire cerebral convexity extending from the vertex inferiorly to the temporal lobe. Greatest degree of mass effect is posteriorly Wo overlying the parietal lobe, were the thickness measures 1.6 cm. There is left to right midline shift at the foramen of Monro measuring approximately 6 mm. Additional subdural along the midline falx anteriorly at the inter hemispheric region and posteriorly along the left tentorium. These results were called by telephone at the time of interpretation on 11/03/2015 at 1:41 pm to Dr. Governor Rooks , who verbally acknowledged these results. Cervical CT: No acute fracture or  malalignment of the cervical spine. Degenerative changes of disc disease, with posterior disc osteophyte complex at C3-C4 and C7-T1, which may contact the anterior surface of the cord at each site. Lobulated soft tissue along the tip of the epiglottis, appears to been present on prior chest CT and measures larger as great as 1.3 cm. This may represent a polyp or benign tumor, however, carcinoma not excluded. Referral for an ENT evaluation and potentially laryngoscopy is recommended. This result was discussed at the time of interpretation on 11/03/2015 at 1:42 pm with Dr. Governor Rooks , who verbally acknowledged these results. Signed, Yvone Neu. Loreta Ave, DO [facility] Electronically Signed   By: Gilmer Mor D.O.   On: 11/03/2015 13:43   Ct Cervical Spine Wo Contrast  Result Date: 11/03/2015 CLINICAL DATA:  82 year old female with a history of fall EXAM: CT HEAD WITHOUT CONTRAST CT CERVICAL SPINE WITHOUT CONTRAST TECHNIQUE: Multidetector CT imaging of the head and cervical spine was performed following the standard protocol without intravenous contrast. Multiplanar CT image reconstructions of the cervical spine were also generated. COMPARISON:  09/06/2011, chest CT 03/27/2015 FINDINGS: CT HEAD FINDINGS Brain: Acute left-sided mixed density subdural hemorrhage layered over the convexity extending from the apex over the frontal and parietal convexities. Coronal images demonstrate the greatest thickness measuring 1.6 cm posteriorly with associated mass effect. Greatest thickness anteriorly over the frontal lobe approximately 1 cm on coronal reformatted images. There is associated midline shift with approximately 6 mm of left-to-right shift at the foramen of Monro. Uniform thickness subdural layered along the falx, extending anteriorly into the interhemispheric region and posteriorly along the left tentorium. Configuration the ventricles unchanged. Gray-white  differentiation relatively maintained. Mild confluent hypodensity in the periventricular white matter. Vascular: Intracranial atherosclerosis. Skull: No fracture identified. Sinuses/Orbits: Left-sided mastoid air cell opacification. Left middle ear fluid. Other: None CT CERVICAL SPINE FINDINGS Alignment: Craniocervical junction aligned. Anatomic alignment of the cervical elements. No subluxation. Skull base and vertebrae: No acute fracture at the skullbase. Vertebral body heights relatively maintained. No acute fracture identified. Soft tissues and spinal canal: No canal hematoma. Dense calcifications of bilateral carotid arteries. Lobulated soft tissue associated with the epiglottis. This was present on a prior CT of the chest 03/19/2015, now enlarged, measuring 13 mm. Disc levels: Disc space narrowing throughout the cervical spine, most pronounced at the level of C5-C6, C6-C7 where there is endplate sclerosis. Posterior disc osteophyte complex at C3-C4 and C7-T1. These posterior central disc osteophyte complex  may contact the ventral surface of the cord. Upper chest: Unremarkable appearance of the lung apices. Other: Bilateral facet disease. IMPRESSION: Head CT: Acute mixed density left-sided subdural hemorrhage, involving the entire cerebral convexity extending from the vertex inferiorly to the temporal lobe. Greatest degree of mass effect is posteriorly Wo overlying the parietal lobe, were the thickness measures 1.6 cm. There is left to right midline shift at the foramen of Monro measuring approximately 6 mm. Additional subdural along the midline falx anteriorly at the inter hemispheric region and posteriorly along the left tentorium. These results were called by telephone at the time of interpretation on 11/03/2015 at 1:41 pm to Dr. Governor RooksEBECCA LORD , who verbally acknowledged these results. Cervical CT: No acute fracture or malalignment of the cervical spine. Degenerative changes of disc disease, with posterior  disc osteophyte complex at C3-C4 and C7-T1, which may contact the anterior surface of the cord at each site. Lobulated soft tissue along the tip of the epiglottis, appears to been present on prior chest CT and measures larger as great as 1.3 cm. This may represent a polyp or benign tumor, however, carcinoma not excluded. Referral for an ENT evaluation and potentially laryngoscopy is recommended. This result was discussed at the time of interpretation on 11/03/2015 at 1:42 pm with Dr. Governor RooksEBECCA LORD , who verbally acknowledged these results. Signed, Yvone NeuJaime S. Loreta AveWagner, DO [facility] Electronically Signed   By: Gilmer MorJaime  Wagner D.O.   On: 11/03/2015 13:43   Dg Hand Complete Left  Result Date: 11/03/2015 CLINICAL DATA:  Fall this morning. Left hand pain, bruising, and tenderness. EXAM: LEFT HAND - COMPLETE 3+ VIEW COMPARISON:  None. FINDINGS: There is no evidence of fracture or dislocation. Diffuse osteopenia noted. Advanced osteoarthritis seen involving the base of the thumb. No other osseous abnormality identified. IMPRESSION: No acute findings. Advanced osteoarthritis involving base of thumb.  Osteopenia. Electronically Signed   By: Myles RosenthalJohn  Stahl M.D.   On: 11/03/2015 13:33    EKG: Orders placed or performed during the hospital encounter of 11/03/15  . EKG 12-Lead  . EKG 12-Lead  . ED EKG  . ED EKG    IMPRESSION AND PLAN: Patient is a 82 year old with multiple medical problems no subdural hematoma  1. Subdural hematoma Supportive care I discussed with patient's son regarding further care and management He states that he does not want her to be on BiPAP, he agrees with having a facemask He still would want her to have medications to treat her chronic medical issues, if no significant improvement over the next 24-48 hours they will consider complete comfort care We'll use IV hydralazine when necessary for elevated blood pressure Patient was on  Coumadin which we will stop Her INR is elevated in light of nonaggressive therapy we will hold off on FFP's   2. Acute respiratory failure I will order a portable chest x-ray Continue oral Lasix as taking for her chronic diastolic CHF  3. Atrial fibrillation Recent history of bradycardia due to metoprolol will monitor on telemetry  4. Hypothyroidism continue Synthroid  5. Hyperlipidemia unspecified continue Lipitor  6. Glaucoma continue eyedrops   All the records are reviewed and case discussed with ED provider. Management plans discussed with the patient, family and they are in agreement.  CODE STATUS:    Code Status Orders        Start     Ordered   11/03/15 1533  Do not attempt resuscitation (DNR)  Continuous  Question Answer Comment  In the event of cardiac or respiratory ARREST Do not call a "code blue"   In the event of cardiac or respiratory ARREST Do not perform Intubation, CPR, defibrillation or ACLS   In the event of cardiac or respiratory ARREST Use medication by any route, position, wound care, and other measures to relive pain and suffering. May use oxygen, suction and manual treatment of airway obstruction as needed for comfort.      11/03/15 1533    Code Status History    Date Active Date Inactive Code Status Order ID Comments User Context   10/29/2015  4:46 PM 10/30/2015  6:37 PM DNR [id number]  Newt Lukes, MD Inpatient   10/24/2015 12:47 PM 10/24/2015 12:53 PM DNR [id number]  Shaune Pollack, MD Inpatient   06/13/2015  8:09 PM 06/17/2015  3:00 PM DNR [id number]  Enedina Finner, MD Inpatient   03/25/2015 11:04 AM 03/29/2015  5:19 PM Full Code [id number]  Ramonita Lab, MD ED   03/25/2015 11:03 AM 03/25/2015 11:04 AM DNR [id number]  Ramonita Lab, MD ED   10/17/2014  9:34 PM 10/24/2014  6:56 PM Full Code [id number]  Ricarda Frame, MD Inpatient   05/31/2014  4:55 PM 06/04/2014  5:10 PM DNR [id number]  Ramonita Lab, MD Inpatient    Advance Directive Documentation   Flowsheet Row  Most Recent Value  Type of Advance Directive  Living will, Healthcare Power of Attorney, Out of facility DNR (pink MOST or yellow form)  Pre-existing out of facility DNR order (yellow form or pink MOST form)  Yellow form placed in chart (order not valid for inpatient use)  "MOST" Form in Place?  No data       TOTAL TIME TAKING CARE OF THIS PATIENT: 55 minutes.    Auburn Bilberry M.D on 11/03/2015 at 3:37 PM  Between 7am to 6pm - Pager - [phone]  After 6pm go to [email or web address] - password [email or web address] [facility]  [place]  [facility]  Office  [phone]  CC: Primary care physician; Sherlene Shams, MD

## 2015-11-03 NOTE — ED Notes (Signed)
Patient transported to X-ray 

## 2015-11-03 NOTE — ED Notes (Signed)
Pt resting in room, non-re breather in place, pt tolerating well. Will continue to monitor

## 2015-11-03 NOTE — ED Provider Notes (Signed)
[facility] Emergency Department Provider Note ____________________________________________   I have reviewed the triage vital signs and the triage nursing note.  HISTORY  Chief Complaint Fall   Historian Patient and her son  HPI Laurie Larsen is a 82 y.o. female is here after a fall overnight, unwitnessed, but states that she is hurting on her left hand where there is some bruising, and she is on Coumadin, as well as low back pain. Back pain is moderate to severe, without focal weakness or numbness.  Chronic home O2 use between 2 and 3 L. Recent hospitalization per the family for congestive heart failure exacerbation.  She doesn't think she hit her head. Denies headache and neck pain.    Past Medical History:  Diagnosis Date  . Acalculous cholecystitis   . Allergy   . Arthritis   . Back complaints   . Bronchitis   . CAD (coronary artery disease)    a. 2005 Cath: 100% dLCX-->Med managed.  . CHF (congestive heart failure) (HCC)   . Chronic diastolic CHF (congestive heart failure), NYHA class 1 (HCC)    a. 04/2013 Echo: EF 50-55%, triv MR, mildly dil LA, PASP , mild TR, mild-mod RA dil. b. echo 02/2015: EF 55-60%, mild MR, mild-mod TR, PA peak pressure 72 mm Hg  . Chronic pain    a. ? due to arthritis  . Chronic Right Pleural Effusion    a. 07/2012 s/p thoracentesis - Followed by Dr. Delford Field [facility] Pulmonology. b. 02/2015: s/p repeat R thoracentesis  . Colon polyps   . Colon polyps   . Diverticulitis large intestine   . Glaucoma   . Hay fever   . History of blood transfusion   . HTN (hypertension)   . Hyperlipidemia   . Hypothyroidism   . Permanent atrial fibrillation (HCC)    a. Chronic Coumadin.  [name] Phlebitis    a. after pacemeker placement in 2010.  . Pulmonary HTN    a. PASP on echo 04/2013. b. PASP 72 mmHg on echo in 02/2015  . Rash/skin eruption    a. Multiple episodes - wide distribution-intermittent, possibly drug rash.   . Sick sinus syndrome (HCC)    a. 05/2008 s/p MDT EnRhythm DC PPM ([facility]).  . Transfusion history    a. After childbirth 60 years ago (1957)  . Urine incontinence     Patient Active Problem List   Diagnosis Date Noted  . Palliative care by specialist 10/25/2015  . Acute respiratory failure with hypoxia (HCC) 10/24/2015  . Septic shock (HCC) 10/24/2015  . Hyperkalemia 10/24/2015  . Medication adverse effect 10/24/2015  . Drug-induced bradycardia 10/24/2015  . Pacer at end of battery life 10/24/2015  . Acute renal failure (HCC) 10/24/2015  . Cholecystostomy care (HCC) 09/05/2015  . Pneumonia 06/13/2015  . DNR (do not resuscitate) 04/06/2015  . Malnutrition of moderate degree 03/26/2015  . Insomnia 02/20/2015  . Macular degeneration 02/20/2015  . Chronic pain syndrome 02/20/2015  . Screening for breast cancer 02/20/2015  . Dyspnea   . Dry eyes 12/14/2014  . Posterior vitreous detachment of both eyes 12/14/2014  . Glaucoma 11/22/2014  . Protein-calorie malnutrition, severe (HCC) 06/01/2014  . Acute on chronic congestive heart failure (HCC) 05/31/2014  . CHF (congestive heart failure), NYHA class I (HCC) 05/31/2014  . Cardiac pacemaker in situ 03/10/2014  . HLD (hyperlipidemia) 03/10/2014  . Nonexudative age-related macular degeneration 12/01/2013  . Status post intraocular lens implant 12/01/2013  . Pulmonary HTN 06/09/2013  .  Nocturnal hypoxemia 04/20/2013  . Depression 06/23/2011  . Acute on chronic diastolic CHF (congestive heart failure) (HCC) 11/13/2010  . Pleural effusion 10/01/2010  . Chronic anticoagulation 07/10/2010  . Hypothyroidism 04/24/2010  . Essential hypertension, benign 03/01/2010  . ATRIAL FIBRILLATION, CHRONIC 03/01/2010  . SSS (sick sinus syndrome) (HCC) 03/01/2010    Past Surgical History:  Procedure Laterality Date  . ABDOMINAL HYSTERECTOMY  1977  . ANKLE FRACTURE SURGERY  1979   Left, pin  . APPENDECTOMY  1939  . BREAST BIOPSY  2011  .  BREAST FIBROADENOMA SURGERY  2010   Right  . BREAST SURGERY     Needle guided excision, right breast calcification  . IR GENERIC HISTORICAL  10/19/2015   IR CHOLANGIOGRAM EXISTING TUBE 10/19/2015 Berdine Dance, MD [facility]-INTERV RAD  . KIDNEY SURGERY  1955   Right  . PACEMAKER INSERTION  06/20/08   MDT EnRhythm DR implanted by Dr Reyes Ivan  . SKIN GRAFT     left leg  . TONSILLECTOMY  82 yrs old    Prior to Admission medications   Medication Sig Start Date End Date Taking? Authorizing Provider  atorvastatin (LIPITOR) 20 MG tablet Take 1 tablet (20 mg total) by mouth at bedtime. 08/28/15  Yes Ellsworth Lennox, PA  docusate sodium (COLACE) 100 MG capsule Take 1 capsule (100 mg total) by mouth 2 (two) times daily. 03/29/15  Yes Ramonita Lab, MD  dorzolamide-timolol (COSOPT) 22.3-6.8 MG/ML ophthalmic solution Place 1 drop into both eyes 2 (two) times daily.    Yes Historical Provider, MD  furosemide (LASIX) 20 MG tablet Take 1 tablet (20 mg total) by mouth 2 (two) times daily. 08/28/15  Yes Ellsworth Lennox, PA  HYDROcodone-acetaminophen (NORCO) 10-325 MG tablet Take 1 tablet by mouth 3 (three) times daily as needed for moderate pain or severe pain. 10/30/15  Yes Katha Hamming, MD  ipratropium-albuterol (DUONEB) 0.5-2.5 (3) MG/3ML SOLN Take 3 mLs by nebulization every 6 (six) hours. 03/29/15  Yes Ramonita Lab, MD  levothyroxine (SYNTHROID, LEVOTHROID) 112 MCG tablet Take 1 tablet (112 mcg total) by mouth daily. 07/09/15  Yes Shelia Media, MD  LORazepam (ATIVAN) 0.5 MG tablet Take 0.5 tablets (0.25 mg total) by mouth at bedtime. 10/30/15  Yes Katha Hamming, MD  polyethylene glycol (MIRALAX / GLYCOLAX) packet Take 17 g by mouth at bedtime.    Yes Historical Provider, MD  potassium chloride (K-DUR) 10 MEQ tablet Take 1 tablet (10 mEq total) by mouth 2 (two) times daily. 08/28/15  Yes Ellsworth Lennox, PA  senna-docusate (SENOKOT-S) 8.6-50 MG tablet Take 1 tablet by mouth at bedtime as needed for  mild constipation. 10/30/15  Yes Katha Hamming, MD  Travoprost, BAK Free, (TRAVATAN) 0.004 % SOLN ophthalmic solution Place 1 drop into both eyes at bedtime.   Yes Historical Provider, MD  Vitamin D, Ergocalciferol, (DRISDOL) 50000 UNITS CAPS capsule Take 50,000 Units by mouth every 7 (seven) days. Pt takes on Saturday.   Yes Historical Provider, MD  warfarin (COUMADIN) 1 MG tablet Take 1 mg by mouth daily.   Yes Historical Provider, MD  warfarin (COUMADIN) 5 MG tablet Take 5 mg by mouth daily at 6 PM.   Yes Historical Provider, MD  acetaminophen (TYLENOL) 650 MG CR tablet Take 1,300 mg by mouth at bedtime as needed for pain.    Historical Provider, MD  feeding supplement, ENSURE ENLIVE, (ENSURE ENLIVE) LIQD Take 237 mLs by mouth 2 (two) times daily with a meal. 03/29/15   Ramonita Lab, MD  traMADol (ULTRAM) 50 MG tablet Take 1 tablet (50 mg total) by mouth 3 (three) times daily as needed for moderate pain. Reported on 06/20/2015 08/17/15   Shelia Media, MD    Allergies  Allergen Reactions  . Amoxicillin Other (See Comments)    Reaction:  Weakness Has patient had a PCN reaction causing immediate rash, facial/tongue/throat swelling, SOB or lightheadedness with hypotension: No Has patient had a PCN reaction causing severe rash involving mucus membranes or skin necrosis: No Has patient had a PCN reaction that required hospitalization No Has patient had a PCN reaction occurring within the last 10 years: No If all of the above answers are "NO", then may proceed with Cephalosporin use.  . Cetylpyridinium Other (See Comments)    Reaction:  Weakness   . Digoxin Other (See Comments)    Reaction:  Weakness and dehydration  . Levaquin [Levofloxacin] Itching  . Risedronate Sodium Rash    Family History  Problem Relation Age of Onset  . Cancer Mother     colon with mets  . Diabetes Mother   . Heart disease Father   . Hyperlipidemia Father   . Hypertension Father   . Heart attack Father    . Stroke Brother     brother who died 06/29/2022  . Heart attack Brother   . Cancer Paternal Aunt     breast  . Cancer Cousin     uterine, lung cancer    Social History Social History  Substance Use Topics  . Smoking status: Never Smoker  . Smokeless tobacco: Never Used  . Alcohol use No    Review of Systems  Constitutional: Negative for Recent fevers. Eyes: Negative for visual changes. ENT: Negative for sore throat. Cardiovascular: Negative for chest pain. Respiratory: Chronic but no acute shortness of breath. Gastrointestinal: Negative for abdominal pain Genitourinary: Negative for dysuria. Musculoskeletal: Positive for back pain. Skin: Negative for rash. Neurological: Negative for headache. 10 point Review of Systems otherwise negative ____________________________________________   PHYSICAL EXAM:  VITAL SIGNS: ED Triage Vitals  Enc Vitals Group     BP 11/03/15 1028 (!) 152/90     Pulse Rate 11/03/15 1028 (!) 110     Resp 11/03/15 1028 20     Temp 11/03/15 1028 97.8 F (36.6 C)     Temp Source 11/03/15 1028 Oral     SpO2 11/03/15 1020 90 %     Weight 11/03/15 1029 108 lb (49 kg)     Height 11/03/15 1029 5\' 4"  (1.626 m)     Head Circumference --      Peak Flow --      Pain Score 11/03/15 1030 10     Pain Loc --      Pain Edu? --      Excl. in GC? --      Constitutional: Alert and Cooperative, complaining of back pain. Well appearing overall and in no distress. HEENT   Head: Normocephalic and atraumatic.      Eyes: Conjunctivae are normal. PERRL. Normal extraocular movements.      Ears:         Nose: No congestion/rhinnorhea.   Mouth/Throat: Mucous membranes are moist.   Neck: No stridor.  Somewhat hunched over, initially no complaint of cervical spine tenderness to palpation or range of motion. Cardiovascular/Chest: Normal rate, irregularly irregular rhythm.  No murmurs, rubs, or gallops. Respiratory: Normal respiratory effort without tachypnea  nor retractions. Breath sounds are clear and equal bilaterally. No wheezes/rales/rhonchi.  Wearing 3  L home O2. Gastrointestinal: Soft. No distention, no guarding, no rebound. Nontender.    Genitourinary/rectal:Deferred Musculoskeletal: Kyphosis and scoliosis clinically. Early ecchymosis across mid thoracic back with some tenderness to palpation. Low back pain centrally and low. Neurologic: No facial droop. No slurred speech.  No gross or focal neurologic deficits are appreciated. Skin:  Skin is warm, dry and intact. No rash noted.    ____________________________________________  LABS (pertinent positives/negatives)  Labs Reviewed  BASIC METABOLIC PANEL - Abnormal; Notable for the following:       Result Value   Glucose, Bld 132 (*)    BUN 38 (*)    Calcium 8.6 (*)    Anion gap 4 (*)    All other components within normal limits  CBC WITH DIFFERENTIAL/PLATELET - Abnormal; Notable for the following:    RDW 15.1 (*)    Platelets 145 (*)    Neutro Abs 7.3 (*)    Lymphs Abs 0.4 (*)    All other components within normal limits  PROTIME-INR - Abnormal; Notable for the following:    Prothrombin Time 19.1 (*)    All other components within normal limits  TROPONIN I  URINALYSIS COMPLETEWITH MICROSCOPIC ([facility] ONLY)    ____________________________________________    EKG I, Governor Rooks, MD, the attending physician have personally viewed and interpreted all ECGs.  92 bpm. Atrial fibrillation. Narrow QRS. Nonspecific ST and T-wave ____________________________________________  RADIOLOGY All Xrays were viewed by me. Imaging interpreted by Radiologist.  Thoracic spine:IMPRESSION: No acute thoracic fracture identified. Mild loss of height of T11. Probable atelectasis with some residual pleural fluid on the right. Cannot exclude right lower lung infiltrate.  Lumbar spine:  IMPRESSION: No acute fracture identified. Advanced spondylosis, scoliosis and mild loss of height at L3 and  L4.  Ct head and cspine:  IMPRESSION: Head CT:  Acute mixed density left-sided subdural hemorrhage, involving the entire cerebral convexity extending from the vertex inferiorly to the temporal lobe. Greatest degree of mass effect is posteriorly Wo overlying the parietal lobe, were the thickness measures 1.6 cm. There is left to right midline shift at the foramen of Monro measuring approximately 6 mm.  Additional subdural along the midline falx anteriorly at the inter hemispheric region and posteriorly along the left tentorium.  These results were called by telephone at the time of interpretation on 11/03/2015 at 1:41 pm to Dr. Governor Rooks , who verbally acknowledged these results.  Cervical CT:  No acute fracture or malalignment of the cervical spine.  Degenerative changes of disc disease, with posterior disc osteophyte complex at C3-C4 and C7-T1, which may contact the anterior surface of the cord at each site.  Lobulated soft tissue along the tip of the epiglottis, appears to been present on prior chest CT and measures larger as great as 1.3 cm. This may represent a polyp or benign tumor, however, carcinoma not excluded. Referral for an ENT evaluation and potentially laryngoscopy is recommended.  This result was discussed at the time of interpretation on 11/03/2015 at 1:42 pm with Dr. Governor Rooks , who verbally acknowledged these results.   __________________________________________  PROCEDURES  Procedure(s) performed: None  Critical Care performed: CRITICAL CARE Performed by: Governor Rooks   Total critical care time: 60 minutes  Critical care time was exclusive of separately billable procedures and treating other patients.  Critical care was necessary to treat or prevent imminent or life-threatening deterioration.  Critical care was time spent personally by me on the following activities: development of treatment plan with patient  and/or surrogate as  well as nursing, discussions with consultants, evaluation of patient's response to treatment, examination of patient, obtaining history from patient or surrogate, ordering and performing treatments and interventions, ordering and review of laboratory studies, ordering and review of radiographic studies, pulse oximetry and re-evaluation of patient's condition.   ____________________________________________   ED COURSE / ASSESSMENT AND PLAN  Pertinent labs & imaging results that were available during my care of the patient were reviewed by me and considered in my medical decision making (see chart for details).  Ms. Monforte was brought in after fall last night, initially seemed to be complaining mostly of low back pain, but on exam she also has an early hematoma to her mid back and also bruising to her left hand and stated that she had some discomfort there. Initially felt like her cervical spine was likely to be cleared clinically, and her son and does not think she had C-spine injury, but I did discuss obtaining a head CT given the fact that she is on Coumadin and had an unwitnessed fall given her age and risk for the possibility of head bleed.  Initially she was given morphine for the significant low back pain although small dose. She does have chronic O2 requirement at 3 L.  X-ray showed no traumatic injury to the back, thoracic or lumbar or left hand. I was called by the CT tech about possibility of head bleed, and I reviewed it myself showing moderately large left sided subdural hematoma acute with some mass effect.  Because of the significant finding, I did go ahead and add on the cervical spine CT. When patient returned to the ED room she was complaining of moderate to severe headache especially behind the eyes. Also O2 sat was noted to be 84% on 3 L, she was switched to a nonrebreather breathing around 91%. No complaint of shortness of breath or chest pain.  Her blood pressure was noted to be  significantly elevated. I discussed with patient and family treating with fentanyl for pain, and treating elevated blood pressure in the setting of head bleed.  Of note, she is on Coumadin but her INR is thankfully 1.5.  Son requests [facility] when presented with options for transfer for neurosurgical backup.  Dr.Jones, [facility] neurosurgery does not recommend initiation of keppra. The neuro ICU is currently full, there was the potential for bed opening in the future, but I will work on another transfer option, because I don't think waiting with unknown time is advisable .  I spoke with both sons now as well as the patient's husband, and they request [facility]. This is potentially a little father then [place] [facility], but they understand the risk of delay and it is much more convenient for them to be [facility]. They confirm that she is a DO NOT RESUSCITATE and DO NOT INTUBATE including no BiPAP. They understand there is some risk of worsening including death with this current diagnosis of acute subdural hematoma. They would still like to be at a center that has a neurosurgery program for consultation and discussion of longer term management issues even if they are not interested in neurosurgery procedure.   Labetolol initiated for blood pressure control, with improvement to 140/90. Headache improved after fentanyl.  I spoke with the neuro ICU attending Dr. Sherryll Burger at [facility], who recommends against transfer of this patient due to her tenuous cardiorespiratory status, and the fact that she is in extremely poor surgical candidate,  and would not be offered craniotomy should she deteriorate. I think this is probably reasonable, I spoke with the family and they are interested in keeping her here. I spoke with Dr. Allena KatzPatel, hospitalist for admission.    CONSULTATIONS:  [facility], does not recommend Keppra, no neuro ICU beds available right now, possibly later.  [facility] -- No icu beds available. [facility] neuro icu attending Dr. Sherryll BurgerShah -  recommends patient would not be a candidate for headache craniotomy given her age and medical status and DO NOT RESUSCITATE status, recommends against transport in the acute phase especially given her hypoxia and tenuous cardiorespiratory issues given that she would not be receiving neurosurgery. He recommended medical monitoring and consideration of transfer is interested in a burr hole after several days if patient remains stable.  Patient / Family / Caregiver informed of clinical course, medical decision-making process, and agree with plan.    ___________________________________________   FINAL CLINICAL IMPRESSION(S) / ED DIAGNOSES   Final diagnoses:  Subdural hematoma (HCC)              Note: This dictation was prepared with Dragon dictation. Any transcriptional errors that result from this process are unintentional    Governor Rooksebecca Shamarr Faucett, MD 11/03/15 1515

## 2015-11-04 LAB — BASIC METABOLIC PANEL
Anion gap: 6 (ref 5–15)
BUN: 46 mg/dL — AB (ref 6–20)
CHLORIDE: 106 mmol/L (ref 101–111)
CO2: 27 mmol/L (ref 22–32)
Calcium: 8.5 mg/dL — ABNORMAL LOW (ref 8.9–10.3)
Creatinine, Ser: 0.9 mg/dL (ref 0.44–1.00)
GFR calc non Af Amer: 55 mL/min — ABNORMAL LOW (ref >60)
GLUCOSE: 103 mg/dL — AB (ref 65–99)
POTASSIUM: 4.5 mmol/L (ref 3.5–5.1)
SODIUM: 139 mmol/L (ref 135–145)

## 2015-11-04 LAB — CBC
HCT: 32.7 % — ABNORMAL LOW (ref 35.0–47.0)
Hemoglobin: 11.2 g/dL — ABNORMAL LOW (ref 12.0–16.0)
MCH: 31.6 pg (ref 26.0–34.0)
MCHC: 34.3 g/dL (ref 32.0–36.0)
MCV: 92 fL (ref 80.0–100.0)
PLATELETS: 148 10*3/uL — AB (ref 150–440)
RBC: 3.55 MIL/uL — AB (ref 3.80–5.20)
RDW: 15 % — AB (ref 11.5–14.5)
WBC: 10.8 10*3/uL (ref 3.6–11.0)

## 2015-11-04 MED ORDER — FUROSEMIDE 10 MG/ML IJ SOLN
20.0000 mg | Freq: Two times a day (BID) | INTRAMUSCULAR | Status: DC
  Administered 2015-11-04 – 2015-11-06 (×4): 20 mg via INTRAVENOUS
  Filled 2015-11-04 (×4): qty 2

## 2015-11-04 NOTE — Plan of Care (Signed)
Problem: Activity: Goal: Risk for activity intolerance will decrease [facility]come[facility]

## 2015-11-04 NOTE — Care Management Important Message (Signed)
Important Message  Patient Details  Name: Laurie Larsen MRN: [id number] Date of Birth: 01-Feb-1923   Medicare Important Message Given:  Yes    Ferris Fielden A, RN 11/04/2015, 12:56 PM

## 2015-11-04 NOTE — Progress Notes (Signed)
[employer] - [place] at [facility]   PATIENT NAME: Laurie Larsen    MR#:  [id number]  DATE OF BIRTH:  1923-06-12  SUBJECTIVE:   Pt. Here due to mechanical fall and noted to have subdural hematoma.  Patient denies any headache, focal weakness, nausea, vomiting. Remains Hypoxic on Hiflo [place].  No other acute events overnight.  REVIEW OF SYSTEMS:    Review of Systems  Constitutional: Negative for chills and fever.  HENT: Negative for congestion and tinnitus.   Eyes: Negative for blurred vision and double vision.  Respiratory: Positive for shortness of breath. Negative for cough and wheezing.   Cardiovascular: Negative for chest pain, orthopnea and PND.  Gastrointestinal: Negative for abdominal pain, diarrhea, nausea and vomiting.  Genitourinary: Negative for dysuria and hematuria.  Musculoskeletal: Positive for falls.  Neurological: Negative for dizziness, sensory change and focal weakness.  All other systems reviewed and are negative.   Nutrition: Heart healthy Tolerating Diet: Yes Tolerating PT: Await Eval   DRUG ALLERGIES:   Allergies  Allergen Reactions  . Amoxicillin Other (See Comments)    Reaction:  Weakness Has patient had a PCN reaction causing immediate rash, facial/tongue/throat swelling, SOB or lightheadedness with hypotension: No Has patient had a PCN reaction causing severe rash involving mucus membranes or skin necrosis: No Has patient had a PCN reaction that required hospitalization No Has patient had a PCN reaction occurring within the last 10 years: No If all of the above answers are "NO", then may proceed with Cephalosporin use.  . Cetylpyridinium Other (See Comments)    Reaction:  Weakness   . Digoxin Other (See Comments)    Reaction:  Weakness and dehydration  . Levaquin [Levofloxacin] Itching  . Risedronate Sodium Rash    VITALS:  Blood pressure 107/72, pulse (!) 101, temperature 98.2 F (36.8 C), temperature source Oral, resp.  rate 20, height 5\' 4"  (1.626 m), weight 49 kg (108 lb), SpO2 97 %.  PHYSICAL EXAMINATION:   Physical Exam  GENERAL:  82 y.o.-year-old patient lying in the bed in no acute distress.  EYES: Pupils equal, round, reactive to light and accommodation. No scleral icterus. Extraocular muscles intact.  HEENT: Head atraumatic, normocephalic. Oropharynx and nasopharynx clear.  NECK:  Supple, no jugular venous distention. No thyroid enlargement, no tenderness.  LUNGS: Normal breath sounds bilaterally, no wheezing, rales, rhonchi. No use of accessory muscles of respiration.  CARDIOVASCULAR: S1, S2 normal. II/VI SEM at RSB, No rubs, or gallops.  ABDOMEN: Soft, nontender, nondistended. Bowel sounds present. No organomegaly or mass.  EXTREMITIES: No cyanosis, clubbing or edema b/l.    NEUROLOGIC: Cranial nerves II through XII are intact. No focal Motor or sensory deficits b/l. Globally weak.  PSYCHIATRIC: The patient is alert and oriented x 3.  SKIN: No obvious rash, lesion, or ulcer.    LABORATORY PANEL:   CBC  Recent Labs Lab 11/04/15 0429  WBC 10.8  HGB 11.2*  HCT 32.7*  PLT 148*   ------------------------------------------------------------------------------------------------------------------  Chemistries   Recent Labs Lab 11/04/15 0429  NA 139  K 4.5  CL 106  CO2 27  GLUCOSE 103*  BUN 46*  CREATININE 0.90  CALCIUM 8.5*   ------------------------------------------------------------------------------------------------------------------  Cardiac Enzymes  Recent Labs Lab 11/03/15 1052  TROPONINI <0.03   ------------------------------------------------------------------------------------------------------------------  RADIOLOGY:  Dg Chest 1 View  Result Date: 11/03/2015 CLINICAL DATA:  Larey Seat today.  History of congestive heart failure. EXAM: CHEST 1 VIEW COMPARISON:  10/26/2015 FINDINGS: Dual lead pacemaker remains in place. The heart  is enlarged. There is aortic  atherosclerosis. There is pulmonary venous hypertension with mild interstitial edema. There are bilateral effusions with dependent pulmonary atelectasis. Findings are similar to the previous study and consistent with chronic congestive heart failure. IMPRESSION: Chronic congestive heart failure.  No change since 10/26/2015. Electronically Signed   By: Paulina FusiMark  Shogry M.D.   On: 11/03/2015 16:13   Dg Thoracic Spine 2 View  Result Date: 11/03/2015 CLINICAL DATA:  Fall this morning with mid back pain and bruising in the upper back at roughly the T6 - 8 level. Initial encounter. EXAM: THORACIC SPINE 2 VIEWS COMPARISON:  Lateral view from a chest x-ray dated 08/17/2015 and frontal chest radiograph on 10/26/2015. FINDINGS: No acute fracture or subluxation identified. Bones are osteopenic and mild spondylosis present throughout the thoracic spine. There is minimal loss of height of the anterior aspect of the T11 vertebral body. No bony lesions identified. Frontal film shows opacity in the right lower chest which may relate to atelectasis. Component of infiltrate cannot be excluded. Degree of pleural fluid appears less prominent. IMPRESSION: No acute thoracic fracture identified. Mild loss of height of T11. Probable atelectasis with some residual pleural fluid on the right. Cannot exclude right lower lung infiltrate. Electronically Signed   By: Irish LackGlenn  Yamagata M.D.   On: 11/03/2015 12:25   Dg Lumbar Spine 2-3 Views  Result Date: 11/03/2015 CLINICAL DATA:  Fall this morning with mid back pain. Initial encounter. EXAM: LUMBAR SPINE - 2-3 VIEW COMPARISON:  CT of the abdomen and pelvis on 10/17/2014 FINDINGS: No acute fracture identified. Advanced lumbar spondylosis with associated degenerative scoliosis present. The most severe disc disease is at L3-4. There is mild loss of height of both L3 and L4 vertebral bodies. Bones are osteopenic. IMPRESSION: No acute fracture identified. Advanced spondylosis, scoliosis and mild  loss of height at L3 and L4. Electronically Signed   By: Irish LackGlenn  Yamagata M.D.   On: 11/03/2015 12:32   Ct Head Wo Contrast  Result Date: 11/03/2015 CLINICAL DATA:  82 year old female with a history of fall EXAM: CT HEAD WITHOUT CONTRAST CT CERVICAL SPINE WITHOUT CONTRAST TECHNIQUE: Multidetector CT imaging of the head and cervical spine was performed following the standard protocol without intravenous contrast. Multiplanar CT image reconstructions of the cervical spine were also generated. COMPARISON:  09/06/2011, chest CT 03/27/2015 FINDINGS: CT HEAD FINDINGS Brain: Acute left-sided mixed density subdural hemorrhage layered over the convexity extending from the apex over the frontal and parietal convexities. Coronal images demonstrate the greatest thickness measuring 1.6 cm posteriorly with associated mass effect. Greatest thickness anteriorly over the frontal lobe approximately 1 cm on coronal reformatted images. There is associated midline shift with approximately 6 mm of left-to-right shift at the foramen of Monro. Uniform thickness subdural layered along the falx, extending anteriorly into the interhemispheric region and posteriorly along the left tentorium. Configuration the ventricles unchanged. Gray-white differentiation relatively maintained. Mild confluent hypodensity in the periventricular white matter. Vascular: Intracranial atherosclerosis. Skull: No fracture identified. Sinuses/Orbits: Left-sided mastoid air cell opacification. Left middle ear fluid. Other: None CT CERVICAL SPINE FINDINGS Alignment: Craniocervical junction aligned. Anatomic alignment of the cervical elements. No subluxation. Skull base and vertebrae: No acute fracture at the skullbase. Vertebral body heights relatively maintained. No acute fracture identified. Soft tissues and spinal canal: No canal hematoma. Dense calcifications of bilateral carotid arteries. Lobulated soft tissue associated with the epiglottis. This was present  on a prior CT of the chest 03/19/2015, now enlarged, measuring 13 mm. Disc levels: Disc space  narrowing throughout the cervical spine, most pronounced at the level of C5-C6, C6-C7 where there is endplate sclerosis. Posterior disc osteophyte complex at C3-C4 and C7-T1. These posterior central disc osteophyte complex may contact the ventral surface of the cord. Upper chest: Unremarkable appearance of the lung apices. Other: Bilateral facet disease. IMPRESSION: Head CT: Acute mixed density left-sided subdural hemorrhage, involving the entire cerebral convexity extending from the vertex inferiorly to the temporal lobe. Greatest degree of mass effect is posteriorly Wo overlying the parietal lobe, were the thickness measures 1.6 cm. There is left to right midline shift at the foramen of Monro measuring approximately 6 mm. Additional subdural along the midline falx anteriorly at the inter hemispheric region and posteriorly along the left tentorium. These results were called by telephone at the time of interpretation on 11/03/2015 at 1:41 pm to Dr. Governor Rooks , who verbally acknowledged these results. Cervical CT: No acute fracture or malalignment of the cervical spine. Degenerative changes of disc disease, with posterior disc osteophyte complex at C3-C4 and C7-T1, which may contact the anterior surface of the cord at each site. Lobulated soft tissue along the tip of the epiglottis, appears to been present on prior chest CT and measures larger as great as 1.3 cm. This may represent a polyp or benign tumor, however, carcinoma not excluded. Referral for an ENT evaluation and potentially laryngoscopy is recommended. This result was discussed at the time of interpretation on 11/03/2015 at 1:42 pm with Dr. Governor Rooks , who verbally acknowledged these results. Signed, Yvone Neu. Loreta Ave, DO [facility] Radiology Electronically Signed   By: Gilmer Mor D.O.   On: 11/03/2015 13:43    Ct Cervical Spine Wo Contrast  Result Date: 11/03/2015 CLINICAL DATA:  82 year old female with a history of fall EXAM: CT HEAD WITHOUT CONTRAST CT CERVICAL SPINE WITHOUT CONTRAST TECHNIQUE: Multidetector CT imaging of the head and cervical spine was performed following the standard protocol without intravenous contrast. Multiplanar CT image reconstructions of the cervical spine were also generated. COMPARISON:  09/06/2011, chest CT 03/27/2015 FINDINGS: CT HEAD FINDINGS Brain: Acute left-sided mixed density subdural hemorrhage layered over the convexity extending from the apex over the frontal and parietal convexities. Coronal images demonstrate the greatest thickness measuring 1.6 cm posteriorly with associated mass effect. Greatest thickness anteriorly over the frontal lobe approximately 1 cm on coronal reformatted images. There is associated midline shift with approximately 6 mm of left-to-right shift at the foramen of Monro. Uniform thickness subdural layered along the falx, extending anteriorly into the interhemispheric region and posteriorly along the left tentorium. Configuration the ventricles unchanged. Gray-white differentiation relatively maintained. Mild confluent hypodensity in the periventricular white matter. Vascular: Intracranial atherosclerosis. Skull: No fracture identified. Sinuses/Orbits: Left-sided mastoid air cell opacification. Left middle ear fluid. Other: None CT CERVICAL SPINE FINDINGS Alignment: Craniocervical junction aligned. Anatomic alignment of the cervical elements. No subluxation. Skull base and vertebrae: No acute fracture at the skullbase. Vertebral body heights relatively maintained. No acute fracture identified. Soft tissues and spinal canal: No canal hematoma. Dense calcifications of bilateral carotid arteries. Lobulated soft tissue associated with the epiglottis. This was present on a prior CT of the chest 03/19/2015, now enlarged, measuring 13 mm. Disc levels: Disc  space narrowing throughout the cervical spine, most pronounced at the level of C5-C6, C6-C7 where there is endplate sclerosis. Posterior disc osteophyte complex at C3-C4 and C7-T1. These posterior central disc osteophyte complex may contact the ventral surface of the cord. Upper chest: Unremarkable appearance  of the lung apices. Other: Bilateral facet disease. IMPRESSION: Head CT: Acute mixed density left-sided subdural hemorrhage, involving the entire cerebral convexity extending from the vertex inferiorly to the temporal lobe. Greatest degree of mass effect is posteriorly Wo overlying the parietal lobe, were the thickness measures 1.6 cm. There is left to right midline shift at the foramen of Monro measuring approximately 6 mm. Additional subdural along the midline falx anteriorly at the inter hemispheric region and posteriorly along the left tentorium. These results were called by telephone at the time of interpretation on 11/03/2015 at 1:41 pm to Dr. Governor Rooks , who verbally acknowledged these results. Cervical CT: No acute fracture or malalignment of the cervical spine. Degenerative changes of disc disease, with posterior disc osteophyte complex at C3-C4 and C7-T1, which may contact the anterior surface of the cord at each site. Lobulated soft tissue along the tip of the epiglottis, appears to been present on prior chest CT and measures larger as great as 1.3 cm. This may represent a polyp or benign tumor, however, carcinoma not excluded. Referral for an ENT evaluation and potentially laryngoscopy is recommended. This result was discussed at the time of interpretation on 11/03/2015 at 1:42 pm with Dr. Governor Rooks , who verbally acknowledged these results. Signed, Yvone Neu. Loreta Ave, DO [facility] Radiology Electronically Signed   By: Gilmer Mor D.O.   On: 11/03/2015 13:43   Dg Hand Complete Left  Result Date: 11/03/2015 CLINICAL DATA:  Fall this morning.  Left hand pain, bruising, and tenderness. EXAM: LEFT HAND - COMPLETE 3+ VIEW COMPARISON:  None. FINDINGS: There is no evidence of fracture or dislocation. Diffuse osteopenia noted. Advanced osteoarthritis seen involving the base of the thumb. No other osseous abnormality identified. IMPRESSION: No acute findings. Advanced osteoarthritis involving base of thumb.  Osteopenia. Electronically Signed   By: Myles Rosenthal M.D.   On: 11/03/2015 13:33     ASSESSMENT AND PLAN:   82 year old female with past medical history of HTN, sick sinus syndrome s/p Pacemaker, Hypothyroidism, a. Fib, Hypothyroidism, chronic diastolic CHF, Pulmonary HTN came into hospital after a fall and noted to have a subdural hematoma.   1. S/p fall with subdural Hematoma - Neurologically stable.   - CT head yesterday showing a subdural hematoma.  ER discussed with Neurosurgery and no plans for intervention at this time and family does not want aggressive intervention given her advanced age.  - hold coumadin, anticoagulants.   2. Essential HTN - cont. Hydralazine PRN   3. Acute Resp. Failure with Hypoxia - due to CHF/Pulmonary edema. CXR showing chronic pulm. Edema.  - will add some IV Lasix and follow response and wean off O2 as tolerated.   4. Hypothyroidism - cont. Synthroid.   5. Glaucoma - cont. Cosopt, Xalatan.   6. Hyperlipidemia - cont. Atorvastatin.      All the records are reviewed and case discussed with Care Management/Social Worker. Management plans discussed with the patient, family and they are in agreement.  CODE STATUS: DNR  DVT Prophylaxis: Ted's and SCD's.   TOTAL TIME TAKING CARE OF THIS PATIENT: 30 minutes.   POSSIBLE D/C IN 1-2 DAYS, DEPENDING ON CLINICAL CONDITION.   Houston Siren M.D on 11/04/2015 at 12:03 PM  Between 7am to 6pm - Pager - [phone]  After 6pm go to [email or web address] - Social research officer, government  [employer] [place] [facility]  Office  [phone]  CC: Primary care  physician; Sherlene Shams, MD

## 2015-11-04 NOTE — Progress Notes (Signed)
Chaplain responded to a request from the nurse to see the patient who fell out of bed and stated to her she did not want to live.  The patient was eating breakfast as I entered the room and expressed her concern was the cold coffee she received. She requested hot coffee.During the visit she did not express harming herself. Jefm PettyChaplain Jodi Criscuolo [phone]

## 2015-11-05 ENCOUNTER — Inpatient Hospital Stay: Payer: Medicare Other

## 2015-11-05 MED ORDER — IPRATROPIUM-ALBUTEROL 0.5-2.5 (3) MG/3ML IN SOLN: 3.0000 mL | Freq: Four times a day (QID) | RESPIRATORY_TRACT | Status: DC | PRN

## 2015-11-05 NOTE — Progress Notes (Signed)
[employer] - [place] at [facility]   PATIENT NAME: Laurie ClinesMargaret Uram    MR#:  [id number]  DATE OF BIRTH:  15-May-1923  SUBJECTIVE:   Pt. Here due to mechanical fall and noted to have subdural hematoma.  CT head today showing improvement in midline shift.  No worsening Neurologic symptoms.  Off Hiflo [place] now.  Shortness of breath improved.   REVIEW OF SYSTEMS:    Review of Systems  Constitutional: Negative for chills and fever.  HENT: Negative for congestion and tinnitus.   Eyes: Negative for blurred vision and double vision.  Respiratory: Positive for shortness of breath. Negative for cough and wheezing.   Cardiovascular: Negative for chest pain, orthopnea and PND.  Gastrointestinal: Negative for abdominal pain, diarrhea, nausea and vomiting.  Genitourinary: Negative for dysuria and hematuria.  Musculoskeletal: Positive for falls.  Neurological: Negative for dizziness, sensory change and focal weakness.  All other systems reviewed and are negative.   Nutrition: Heart healthy Tolerating Diet: Yes Tolerating PT: Await Eval   DRUG ALLERGIES:   Allergies  Allergen Reactions  . Amoxicillin Other (See Comments)    Reaction:  Weakness Has patient had a PCN reaction causing immediate rash, facial/tongue/throat swelling, SOB or lightheadedness with hypotension: No Has patient had a PCN reaction causing severe rash involving mucus membranes or skin necrosis: No Has patient had a PCN reaction that required hospitalization No Has patient had a PCN reaction occurring within the last 10 years: No If all of the above answers are "NO", then may proceed with Cephalosporin use.  . Cetylpyridinium Other (See Comments)    Reaction:  Weakness   . Digoxin Other (See Comments)    Reaction:  Weakness and dehydration  . Levaquin [Levofloxacin] Itching  . Risedronate Sodium Rash    VITALS:  Blood pressure (!) 97/53, pulse 94, temperature 98.5 F (36.9 C), temperature source  Oral, resp. rate 20, height 5\' 4"  (1.626 m), weight 49 kg (108 lb), SpO2 95 %.  PHYSICAL EXAMINATION:   Physical Exam  GENERAL:  82 y.o.-year-old patient sitting up in chair in no acute distress.  EYES: Pupils equal, round, reactive to light and accommodation. No scleral icterus. Extraocular muscles intact.  HEENT: Head atraumatic, normocephalic. Oropharynx and nasopharynx clear.  NECK:  Supple, no jugular venous distention. No thyroid enlargement, no tenderness.  LUNGS: Normal breath sounds bilaterally, no wheezing, rales, rhonchi. No use of accessory muscles of respiration.  CARDIOVASCULAR: S1, S2 normal. II/VI SEM at RSB, No rubs, or gallops.  ABDOMEN: Soft, nontender, nondistended. Bowel sounds present. No organomegaly or mass.  EXTREMITIES: No cyanosis, clubbing or edema b/l.    NEUROLOGIC: Cranial nerves II through XII are intact. No focal Motor or sensory deficits b/l. Globally weak.  PSYCHIATRIC: The patient is alert and oriented x 3.  SKIN: No obvious rash, lesion, or ulcer.    LABORATORY PANEL:   CBC  Recent Labs Lab 11/04/15 0429  WBC 10.8  HGB 11.2*  HCT 32.7*  PLT 148*   ------------------------------------------------------------------------------------------------------------------  Chemistries   Recent Labs Lab 11/04/15 0429  NA 139  K 4.5  CL 106  CO2 27  GLUCOSE 103*  BUN 46*  CREATININE 0.90  CALCIUM 8.5*   ------------------------------------------------------------------------------------------------------------------  Cardiac Enzymes  Recent Labs Lab 11/03/15 1052  TROPONINI <0.03   ------------------------------------------------------------------------------------------------------------------  RADIOLOGY:  Dg Chest 1 View  Result Date: 11/03/2015 CLINICAL DATA:  Larey SeatFell today.  History of congestive heart failure. EXAM: CHEST 1 VIEW COMPARISON:  10/26/2015 FINDINGS: Dual lead pacemaker  remains in place. The heart is enlarged. There is  aortic atherosclerosis. There is pulmonary venous hypertension with mild interstitial edema. There are bilateral effusions with dependent pulmonary atelectasis. Findings are similar to the previous study and consistent with chronic congestive heart failure. IMPRESSION: Chronic congestive heart failure.  No change since 10/26/2015. Electronically Signed   By: Paulina Fusi M.D.   On: 11/03/2015 16:13   Ct Head Wo Contrast  Result Date: 11/05/2015 CLINICAL DATA:  Subdural hematoma EXAM: CT HEAD WITHOUT CONTRAST TECHNIQUE: Contiguous axial images were obtained from the base of the skull through the vertex without intravenous contrast. COMPARISON:  11/03/2015 FINDINGS: Brain: Left subdural hematoma has decreased in thickness, now approximately 10 mm maximally posteriorly on coronal reconstructed images compared with 16 mm previously. There is increasing density noted along the posterior falx. New extra-axial hyperdense fluid noted over the posterior right occipital, likely new small subdural hematoma on the right posteriorly. Decreasing left-to-right midline shift, now approximately 4 mm compared to 6 mm previously. There is atrophy and chronic small vessel disease changes. Vascular: No hyperdense vessel or unexpected calcification. Skull: No acute calvarial abnormality. Sinuses/Orbits: Visualized paranasal sinuses and mastoids clear. Orbital soft tissues unremarkable. Other: None IMPRESSION: Decreasing size of the left frontal/ parietal subdural hematoma. However, there is increasing hyperdense blood noted along the posterior falx and left tentorium as well as posterior to the right occipital lobe currently compatible with new or enlarging components of the subdural hematoma. Decreasing left-to-right midline shift. Atrophy, chronic small vessel disease. Electronically Signed   By: Charlett Nose M.D.   On: 11/05/2015 13:03     ASSESSMENT AND PLAN:   82 year old female with past medical history of HTN, sick sinus  syndrome s/p Pacemaker, Hypothyroidism, a. Fib, Hypothyroidism, chronic diastolic CHF, Pulmonary HTN came into hospital after a fall and noted to have a subdural hematoma.   1. S/p fall with subdural Hematoma - Neurologically stable.   - repeat CT head today showing improvement in subdural hematoma and midline shift. Clinically pt. Is stable. ER discussed with Neurosurgery and no plans for intervention at this time and family does not want aggressive intervention given her advanced age.  - cont. To hold coumadin.    2. Essential HTN - cont. Hydralazine PRN  - bp stable.  3. Acute Resp. Failure with Hypoxia - due to CHF/Pulmonary edema. CXR showing chronic pulm. Edema.  - weaned off HiflO [place] and cont. IV lasix and will switch to Oral in a.m.   4. Hypothyroidism - cont. Synthroid.   5. Glaucoma - cont. Cosopt, Xalatan.   6. Hyperlipidemia - cont. Atorvastatin.   Likely d/c back to SNF tomorrow.  Discussed w/ son over the phone and updated him.    All the records are reviewed and case discussed with Care Management/Social Worker. Management plans discussed with the patient, family and they are in agreement.  CODE STATUS: DNR  DVT Prophylaxis: Ted's and SCD's.   TOTAL TIME TAKING CARE OF THIS PATIENT: 30 minutes.   POSSIBLE D/C IN 1-2 DAYS, DEPENDING ON CLINICAL CONDITION.   [place] Siren M.D on 11/05/2015 at 2:36 PM  Between 7am to 6pm - Pager - [phone]  After 6pm go to [email or web address] - Social research officer, government  [employer] [place] [facility]  Office  [phone]  CC: Primary care physician; Sherlene Shams, MD

## 2015-11-05 NOTE — [place] FL2 (Signed)
[place] MEDICAID FL2 LEVEL OF CARE SCREENING TOOL     IDENTIFICATION  Patient Name: Laurie Larsen Birthdate: 1923-11-01 Sex: female Admission Date (Current Location): 11/03/2015  [place] and [place] Number:  Chiropodist and Address:  [facility], [address] [place], [place] [address]      Provider Number: [id number]  Attending Physician Name and Address:  Houston Siren, MD  Relative Name and Phone Number:       Current Level of Care: Hospital Recommended Level of Care: Skilled Nursing Facility Prior Approval Number:    Date Approved/Denied:   PASRR Number:   [id number]   Discharge Plan: SNF    Current Diagnoses: Patient Active Problem List   Diagnosis Date Noted  . Subdural hematoma (HCC) 11/03/2015  . Palliative care by specialist 10/25/2015  . Acute respiratory failure with hypoxia (HCC) 10/24/2015  . Septic shock (HCC) 10/24/2015  . Hyperkalemia 10/24/2015  . Medication adverse effect 10/24/2015  . Drug-induced bradycardia 10/24/2015  . Pacer at end of battery life 10/24/2015  . Acute renal failure (HCC) 10/24/2015  . Cholecystostomy care (HCC) 09/05/2015  . Pneumonia 06/13/2015  . DNR (do not resuscitate) 04/06/2015  . Malnutrition of moderate degree 03/26/2015  . Insomnia 02/20/2015  . Macular degeneration 02/20/2015  . Chronic pain syndrome 02/20/2015  . Screening for breast cancer 02/20/2015  . Dyspnea   . Dry eyes 12/14/2014  . Posterior vitreous detachment of both eyes 12/14/2014  . Glaucoma 11/22/2014  . Protein-calorie malnutrition, severe (HCC) 06/01/2014  . Acute on chronic congestive heart failure (HCC) 05/31/2014  . CHF (congestive heart failure), NYHA class I (HCC) 05/31/2014  . Cardiac pacemaker in situ 03/10/2014  . HLD (hyperlipidemia) 03/10/2014  . Nonexudative age-related macular degeneration 12/01/2013  . Status post intraocular lens implant 12/01/2013  . Pulmonary HTN 06/09/2013  .  Nocturnal hypoxemia 04/20/2013  . Depression 06/23/2011  . Acute on chronic diastolic CHF (congestive heart failure) (HCC) 11/13/2010  . Pleural effusion 10/01/2010  . Chronic anticoagulation 07/10/2010  . Hypothyroidism 04/24/2010  . Essential hypertension, benign 03/01/2010  . ATRIAL FIBRILLATION, CHRONIC 03/01/2010  . SSS (sick sinus syndrome) (HCC) 03/01/2010    Orientation RESPIRATION BLADDER Height & Weight     Self, Time, Situation, Place  O2 (Nasal Cannula 6 (L/min) ) Incontinent Weight: 108 lb (49 kg) Height:  5\' 4"  (162.6 cm)  BEHAVIORAL SYMPTOMS/MOOD NEUROLOGICAL BOWEL NUTRITION STATUS   (None)  (None) Continent Diet (Diet 2 gram sodium )  AMBULATORY STATUS COMMUNICATION OF NEEDS Skin   Extensive Assist Verbally Normal                       Personal Care Assistance Level of Assistance  Bathing, Feeding, Dressing Bathing Assistance: Limited assistance Feeding assistance: Independent Dressing Assistance: Limited assistance     Functional Limitations Info  Sight, Hearing, Speech Sight Info: Adequate Hearing Info: Adequate Speech Info: Adequate    SPECIAL CARE FACTORS FREQUENCY  PT (By licensed PT)     PT Frequency:  (5)              Contractures      Additional Factors Info  Code Status, Allergies Code Status Info:  (Full Code) Allergies Info: Amoxicillin, Cetylpyridinium, Digoxin, Levaquin Levofloxacin, Risedronate Sodium           Current Medications (11/05/2015):  This is the current hospital active medication list Current Facility-Administered Medications  Medication Dose Route Frequency Provider Last Rate Last Dose  .  0.9 %  sodium chloride infusion  250 mL Intravenous PRN Auburn BilberryShreyang Patel, MD      . acetaminophen (TYLENOL) tablet 650 mg  650 mg Oral Q6H PRN Auburn BilberryShreyang Patel, MD       Or  . acetaminophen (TYLENOL) suppository 650 mg  650 mg Rectal Q6H PRN Auburn BilberryShreyang Patel, MD      . atorvastatin (LIPITOR) tablet 20 mg  20 mg Oral QHS Auburn BilberryShreyang  Patel, MD   20 mg at 11/04/15 2220  . docusate sodium (COLACE) capsule 100 mg  100 mg Oral BID Auburn BilberryShreyang Patel, MD   100 mg at 11/04/15 2220  . dorzolamide-timolol (COSOPT) 22.3-6.8 MG/ML ophthalmic solution 1 drop  1 drop Both Eyes BID Auburn BilberryShreyang Patel, MD   1 drop at 11/05/15 1228  . feeding supplement (ENSURE ENLIVE) (ENSURE ENLIVE) liquid 237 mL  237 mL Oral BID WC Auburn BilberryShreyang Patel, MD   237 mL at 11/05/15 1051  . furosemide (LASIX) injection 20 mg  20 mg Intravenous Q12H Houston SirenVivek J Sainani, MD   20 mg at 11/05/15 1048  . hydrALAZINE (APRESOLINE) injection 10 mg  10 mg Intravenous Q6H PRN Auburn BilberryShreyang Patel, MD      . HYDROcodone-acetaminophen (NORCO/VICODIN) 5-325 MG per tablet 1-2 tablet  1-2 tablet Oral Q4H PRN Auburn BilberryShreyang Patel, MD   2 tablet at 11/05/15 1105  . ipratropium-albuterol (DUONEB) 0.5-2.5 (3) MG/3ML nebulizer solution 3 mL  3 mL Nebulization Q6H PRN Houston SirenVivek J Sainani, MD      . latanoprost (XALATAN) 0.005 % ophthalmic solution 1 drop  1 drop Both Eyes QHS Auburn BilberryShreyang Patel, MD   1 drop at 11/04/15 2219  . levothyroxine (SYNTHROID, LEVOTHROID) tablet 112 mcg  112 mcg Oral QAC breakfast Auburn BilberryShreyang Patel, MD   112 mcg at 11/05/15 1045  . LORazepam (ATIVAN) tablet 0.25 mg  0.25 mg Oral QHS Auburn BilberryShreyang Patel, MD   0.25 mg at 11/04/15 2220  . morphine 2 MG/ML injection 1 mg  1 mg Intravenous Once Governor Rooksebecca Lord, MD      . morphine 2 MG/ML injection 2 mg  2 mg Intravenous Q4H PRN Auburn BilberryShreyang Patel, MD   2 mg at 11/04/15 0422  . ondansetron (ZOFRAN) tablet 4 mg  4 mg Oral Q6H PRN Auburn BilberryShreyang Patel, MD       Or  . ondansetron (ZOFRAN) injection 4 mg  4 mg Intravenous Q6H PRN Auburn BilberryShreyang Patel, MD      . polyethylene glycol (MIRALAX / GLYCOLAX) packet 17 g  17 g Oral QHS Auburn BilberryShreyang Patel, MD   17 g at 11/04/15 2219  . potassium chloride (K-DUR,KLOR-CON) CR tablet 10 mEq  10 mEq Oral BID Auburn BilberryShreyang Patel, MD   10 mEq at 11/05/15 1045  . senna-docusate (Senokot-S) tablet 1 tablet  1 tablet Oral QHS PRN Auburn BilberryShreyang Patel, MD      .  sodium chloride flush (NS) 0.9 % injection 3 mL  3 mL Intravenous Q12H Auburn BilberryShreyang Patel, MD   3 mL at 11/04/15 2221  . sodium chloride flush (NS) 0.9 % injection 3 mL  3 mL Intravenous Q12H Auburn BilberryShreyang Patel, MD   3 mL at 11/05/15 1049  . sodium chloride flush (NS) 0.9 % injection 3 mL  3 mL Intravenous PRN Auburn BilberryShreyang Patel, MD      . Melene Muller[START ON 11/10/2015] Vitamin D (Ergocalciferol) (DRISDOL) capsule 50,000 Units  50,000 Units Oral Q7 days Auburn BilberryShreyang Patel, MD         Discharge Medications: Please see discharge summary for a list of discharge medications.  Relevant Imaging Results:  Relevant Lab Results:   Additional Information  (SSN [id number])  Verta Ellen Lyndle Pang, LCSW

## 2015-11-05 NOTE — Evaluation (Signed)
Physical Therapy Evaluation Patient Details Name: NIQUITA DIGIOIA MRN: [id number] DOB: [birth year] Today's Date: 11/05/2015   History of Present Illness  Pt is a 82 y/o female admitted with a subdural hematoma with 6 mm L to R midline shift (B carotid dense calcifications also on CT 11/03/15) after a fall. Pt presents with pain in L hand and back (no acute fx as of 11/03/15). No planned interventions at this time for the subdural hematoma. Recent hospitalization for CHF exacerbation. PMH of CHF, COPD, A-fib, dysrhthmias and has a pacemaker. Pt on home O2 at 2-3 L/min.   Clinical Impression  Pt reports living at [facility] and then corrects to living at [facility] with her husband prior to this hospital admission. Pt reports having help everyday to bring medications and to check in. Pt demonstrates generalized weakness in B UE 's and B LE 's at least 3/5. Prior to admission pt able to ambulate with RW and was requiring some assistance as pt reports ("I should have known better and should have waited to get up"). Pt reporting throughout session wanting it to be her time to pass away. At this time pt is + 1 min assist for bed mobility, +1 min assist for transfers, and min guard taking a few steps using RW. O2 92% or greater throughout session on nasal cannula at 6 L/min. Pt reports pressure pain in head (7/10) and decreased vision now compared to before the fall (RN and MD notified). Pt will benefit from continued PT in order to address these impairments and to improve functional mobility.     Follow Up Recommendations SNF    Equipment Recommendations  Rolling walker with 5" wheels (pt has rollator)    Recommendations for Other Services       Precautions / Restrictions Precautions Precautions: Fall Restrictions Weight Bearing Restrictions: No      Mobility  Bed Mobility Overal bed mobility: Needs Assistance Bed Mobility: Supine to Sit     Supine to sit: Min assist;HOB elevated     General  bed mobility comments: pt required + 1 min to push self into seated position and to scoot to EOB.   Transfers Overall transfer level: Needs assistance Equipment used: Rolling walker (2 wheeled) Transfers: Sit to/from [employer] Sit to Stand: Min assist Stand pivot transfers: Min guard       General transfer comment: pt requiring mod vc's to improve posture with sit-stand x2 trials and mod vc's/tactile cues for handplacement on the RW x 1 trial; stand pivot x1 to BSC +1 min assist with min vc's and assistance required for briefs; pt able to complete hygiene independently with +1 min assist for standing  Ambulation/Gait Ambulation/Gait assistance: Min guard Ambulation Distance (Feet): 2 Feet Assistive device: Rolling walker (2 wheeled) Gait Pattern/deviations: Step-to pattern;Decreased stride length     General Gait Details: approximately 3-4 backwards steps and 1 lateral step to recliner with mod vc's  Stairs            Wheelchair Mobility    Modified Rankin (Stroke Patients Only)       Balance Overall balance assessment: Needs assistance Sitting-balance support: Feet supported;Bilateral upper extremity supported Sitting balance-Leahy Scale: Fair Sitting balance - Comments: pt flexed forward and leaning to the left, able to sit min guard   Standing balance support: Bilateral upper extremity supported Standing balance-Leahy Scale: Poor Standing balance comment: pt requires min guard to prevent forward lean especially when transferring sit to stand due to flexed posture (also  needs guarding of head and mod vc's to not hit head on RW with this transfer)                             Pertinent Vitals/Pain Pain Assessment: 0-10 Pain Score: 7  Pain Location: head (with a lot of pressure between the eyes) Pain Descriptors / Indicators: Pressure Pain Intervention(s): Limited activity within patient's tolerance;Monitored during  session;Repositioned;RN gave pain meds during session  Pt O2 92% or greater throughout session on nasal cannula at 6 L/min. HR between 75 bpm and 99 bpm throughout session. BP at rest 118/59, after transfer to commode 132/76, and 112/60 sitting in recliner at end of session.     Home Living Family/patient expects to be discharged to:: Assisted living Living Arrangements: Spouse/significant other Available Help at Discharge: Family;Available 24 hours/day Type of Home: Independent living facility Home Access: Level entry     Home Layout: One level Home Equipment: Walker - 4 wheels; shower seat, raised commode; handrail by commode Additional Comments: Pt reports being in her "room" at [employer].     Prior Function Level of Independence: Needs assistance   Gait / Transfers Assistance Needed: supervision or more for mobility with rollator  ADL's / Homemaking Assistance Needed: min assistance; someone brings medicine and checks on pt and her spouse everyday        Hand Dominance        Extremity/Trunk Assessment   Upper Extremity Assessment: Generalized weakness (grossly 3/5 bilaterally)           Lower Extremity Assessment: Generalized weakness (at least 3/5)      Cervical / Trunk Assessment: Kyphotic  Communication   Communication: HOH  Cognition Arousal/Alertness: Awake/alert Behavior During Therapy: Anxious (Pt made several comments throughout session about wishing it was her time and began crying and reporting how she "messed up trying to get up without help". No self-harm actions or verbal statements made throughout session. RN aware of this behavior and chaplain has already been in to see pt concerning this. Chaplain notified of continued behavior and reports that they will see the pt again. Pt also reporting displeasure with [facility] not having a neurosurgeon.)  Overall Cognitive Status:  (Pt oriented x 4)                      General Comments General comments  (skin integrity, edema, etc.): Pt is agreeable to PT session. Pt spouse in room at end of session.    Exercises     Assessment/Plan    PT Assessment Patient needs continued PT services  PT Problem List Decreased strength;Decreased activity tolerance;Decreased balance;Decreased mobility;Decreased safety awareness;Cardiopulmonary status limiting activity;Pain          PT Treatment Interventions DME instruction;Gait training;Therapeutic activities;Therapeutic exercise;Balance training;Patient/family education    PT Goals (Current goals can be found in the Care Plan section)  Acute Rehab PT Goals Patient Stated Goal: No functional goals stated at this time    Frequency Min 2X/week   Barriers to discharge        Co-evaluation               End of Session Equipment Utilized During Treatment: Gait belt Activity Tolerance: Patient limited by pain;Patient limited by fatigue Patient left: in chair;with call bell/phone within reach;with chair alarm set;with family/visitor present Nurse Communication: Mobility status         Time: 1100-1140 PT Time Calculation (min) (ACUTE  ONLY): 40 min   Charges:         PT G Codes:        Albertina SenegalMeagan Cherisa Brucker, SPT 11/05/2015, 12:32 PM

## 2015-11-05 NOTE — Progress Notes (Addendum)
[place] Must has escalated patient's PASRR request due to some system issues. CSW spoke to patient to obtain confirmation of SSN (Patient could not provide), Middle name Sallye Ober(Louise) and Madien name Zena Amos(Needham). Prov[facility] this to [facility]. [place] [facility] requested CSW call back tomorrow at 9AM to continue processing PASRR request. CSW will continue to follow and assist.  Woodroe Modehristina Kerington Hildebrant, MSW, LCSW, LCAS-A Clinical Social Worker [phone]

## 2015-11-05 NOTE — Clinical Social Work Note (Signed)
Clinical Social Work Assessment  Patient Details  Name: Laurie CobbsMargaret N Larsen MRN: [id number] Date of Birth: January 15, 1924  Date of referral:  11/05/15               Reason for consult:  Discharge Planning                Permission sought to share information with:  Family Supports Permission granted to share information::  Yes, Verbal Permission Granted  Name::        Agency::     Relationship::   (Husband& Son- Jillyn HiddenGary)  Contact Information:     Housing/Transportation Living arrangements for the past 2 months:  Apartment, Skilled Nursing Facility Source of Information:  Patient Patient Interpreter Needed:  None Criminal Activity/Legal Involvement Pertinent to Current Situation/Hospitalization:  No - Comment as needed Significant Relationships:  Adult Children, Friend, Spouse Lives with:  Spouse Do you feel safe going back to the place where you live?  Yes Need for family participation in patient care:  Yes (Comment) (Husband& SonJillyn Hidden- [place])  Care giving concerns:  [facility]    Social Worker assessment / plan:  CSW is familiar with patient due to [facility]. CSW spoke to patient and her husband at bedside. Introduced herself and her role. Per patient she [facility] but she really wants to go home. Stated she understands that she's too weak to go home. Granted CSW verbal permission to coordinate [facility]. Also granted CSW verbal permission to speak to her son about discharge plans. [facility]. Per Marcelino DusterMichelle patient may return at discharge.   FL2 completed. CSW is attempting to obtain patient's PASRR number through [place] [facility].   Employment status:  Retired Health and safety inspectornsurance information:  Medicare PT Recommendations:  Skilled Nursing Facility Information / Referral to community resources:  Skilled Nursing Facility  Patient/Family's Response to care:  Patient is in agreement to return to [facility] at  discharge.   Patient/Family's Understanding of and Emotional Response to Diagnosis, Current Treatment, and Prognosis:  Patient reports she understands. Thanks CSW for assistance.   Emotional Assessment Appearance:  Appears stated age Attitude/Demeanor/Rapport:   (None) Affect (typically observed):  Accepting, Calm Orientation:  Oriented to Self, Oriented to Place, Oriented to  Time, Oriented to Situation Alcohol / Substance use:  Not Applicable Psych involvement (Current and /or in the community):  No (Comment)  Discharge Needs  Concerns to be addressed:  Discharge Planning Concerns Readmission within the last 30 days:  Yes Current discharge risk:  Chronically ill Barriers to Discharge:  Continued Medical Work up   WalgreenChristina E Malyn Aytes, LCSW 11/05/2015, 3:37 PM

## 2015-11-05 NOTE — Progress Notes (Signed)
Chaplain responded to a request from the nurse about patient who stated to her she did not want to live. Upon arrival, husband and two friends were bedside. Pt was in a somber mood. After some light conversation about a favorite card game [employer](Rook) the Pt mood seemed a bit lifted. Pt asked for prayer to "feel good again" which was provided. Pt, husband and friends were appreciative of the visit. Ch will follow up on my next rounding on 11/07/15.    11/05/15 1800  Clinical Encounter Type  Visited With Patient;Patient and family together;Health care provider  Visit Type Follow-up;Spiritual support;Social support  Referral From Nurse  Consult/Referral To Chaplain  Spiritual Encounters  Spiritual Needs Prayer;Emotional

## 2015-11-06 ENCOUNTER — Telehealth: Payer: Self-pay | Admitting: Cardiology

## 2015-11-06 DIAGNOSIS — M6281 Muscle weakness (generalized): Secondary | ICD-10-CM

## 2015-11-06 MED ORDER — HYDROCODONE-ACETAMINOPHEN 10-325 MG PO TABS: 1.0000 | ORAL_TABLET | Freq: Three times a day (TID) | ORAL | 0 refills | Status: AC | PRN

## 2015-11-06 NOTE — Progress Notes (Signed)
PT Cancellation Note  Patient Details Name: Laurie Larsen MRN: [id number] DOB: 09/15/1923   Cancelled Treatment:    Reason Eval/Treat Not Completed: Other (comment) Pt was eating breakfast upon PT arrival and declined PT services. PT will return at a later date/time to complete treatment as able.    Albertina SenegalMeagan Reagan Klemz, SPT 11/06/2015, 9:41 AM

## 2015-11-06 NOTE — Telephone Encounter (Signed)
Left msg to call.

## 2015-11-06 NOTE — Progress Notes (Signed)
CSW contacted [place] [facility] to determine if they've fixed patient's PASRR issue. Reported it has not been fixed yet. Requested CSW call back around 12PM. CSW provided [place] Must with her phone number to call her if status changes. CSW will continue to follow and assist.  Woodroe Modehristina Tearsa Kowalewski, MSW, LCSW, LCAS-A Clinical Social Worker [phone]

## 2015-11-06 NOTE — Telephone Encounter (Signed)
New message  Son called   Pt C/O medication issue:  1. Name of Medication: previous coumadin    2. How are you currently taking this medication (dosage and times per day)? 5 mg or  1 mg listed   3. Are you having a reaction (difficulty breathing--STAT)? No   4. What is your medication issue? Patient fell on Saturday  re[facility] - son has question to ask

## 2015-11-06 NOTE — Progress Notes (Signed)
Physical Therapy Treatment Patient Details Name: Laurie Larsen MRN: [id number] DOB: 1923/11/17 Today's Date: 11/06/2015    History of Present Illness Pt is a 82 y/o female admitted with a subdural hematoma with 6 mm L to R midline shift (B carotid dense calcifications also on CT 11/03/15) after a fall. Pt presents with pain in L hand and back (no acute fx as of 11/03/15). No planned interventions at this time for the subdural hematoma. Recent hospitalization for CHF exacerbation. PMH of CHF, COPD, A-fib, dysrhthmias and has a pacemaker. Pt on home O2 at 2-3 L/min.     PT Comments    Pt is in improved spirits today. Pt tolerated all LE exercises well with increasing fatigue noted with SLR's. Pt able to transfer supine to sit supervision for safety only. Pt able to transfer sit to stand and bed to chair +1 min guard using RW and with min vc's. Pt O2 desat to 89% after sitting EOB but returns to 91% quickly with min vc's to purse-lip breathe on 3 L/min nasal cannula. Pt HR 113 bpm at rest and raises to 124 bpm with activity. HR settles to 114 bpm at end of session. Pt will benefit from progressing therapeutic exercises and ambulation distance.     Follow Up Recommendations  SNF     Equipment Recommendations  Rolling walker with 5" wheels    Recommendations for Other Services       Precautions / Restrictions Precautions Precautions: Fall Restrictions Weight Bearing Restrictions: No    Mobility  Bed Mobility Overal bed mobility: Needs Assistance Bed Mobility: Supine to Sit     Supine to sit: Supervision;HOB elevated     General bed mobility comments: pt requires increased time to complete; supervision for safety   Transfers Overall transfer level: Needs assistance Equipment used: Rolling walker (2 wheeled) Transfers: Sit to/from [employer];Stand Pivot Transfers Sit to Stand: Min guard Stand pivot transfers: Min guard       General transfer comment: min vc's for hand placement  on RW and to improve standing posture  Ambulation/Gait Ambulation/Gait assistance: Min guard Ambulation Distance (Feet): 2 Feet Assistive device: Rolling walker (2 wheeled) Gait Pattern/deviations: Step-to pattern Gait velocity: decreased   General Gait Details: initial vc's to coordinate steps with movement of RW which improved; approximately 6 steps from bed to recliner   Stairs            Wheelchair Mobility    Modified Rankin (Stroke Patients Only)       Balance Overall balance assessment: Needs assistance Sitting-balance support: Single extremity supported;Feet supported Sitting balance-Leahy Scale: Fair Sitting balance - Comments: pt continues to sit flexed; supervision sitting EOB     Standing balance-Leahy Scale: Fair Standing balance comment: pt requires min guard with one slight LOB that she was able to self-correct                    Cognition Arousal/Alertness: Awake/alert Behavior During Therapy: WFL for tasks assessed/performed Overall Cognitive Status: Within Functional Limits for tasks assessed                      Exercises General Exercises - Lower Extremity Short Arc Quad: AROM;Strengthening;Both;10 reps;Supine Hip ABduction/ADduction: AROM;Strengthening;Both;10 reps;Supine (min vc's to complete through full range) Straight Leg Raises: AROM;Strengthening;Both;10 reps;Supine (pt decreases range of motion as she fatigues)    General Comments General comments (skin integrity, edema, etc.): Pt is agreeable to PT session.  Pertinent Vitals/Pain Pain Assessment: No/denies pain    Home Living                      Prior Function            PT Goals (current goals can now be found in the care plan section) Acute Rehab PT Goals Patient Stated Goal: To have a BM to help relieve discomfort. PT Goal Formulation: With patient Time For Goal Achievement: 11/12/15 Potential to Achieve Goals: Fair Progress towards PT  goals: Progressing toward goals    Frequency    Min 2X/week      PT Plan Current plan remains appropriate    Co-evaluation             End of Session Equipment Utilized During Treatment: Gait belt;Oxygen Activity Tolerance: Patient limited by fatigue Patient left: in chair;with call bell/phone within reach;with chair alarm set     Time: 1610-9604 PT Time Calculation (min) (ACUTE ONLY): 33 min  Charges:                       G Codes:      Albertina Senegal, SPT 11/06/2015, 3:24 PM

## 2015-11-06 NOTE — Progress Notes (Signed)
Nurse called chaplain to inform that a patient in [id number] A wanted a chaplain to prayer for her. Chaplain visited the patient who appeared to be exhausted but was still alert and had a conversation with the patient. Pt told ch that she was being transferred to another care facility and was waiting for a ride to come. Pt was a bit worried going back and requested for prayers, which Ch offered, and also consoled the Pt.     11/06/15 1600  Clinical Encounter Type  Visited With Patient  Visit Type Initial;Follow-up;Spiritual support  Referral From Nurse  Consult/Referral To Chaplain  Spiritual Encounters  Spiritual Needs Prayer;Emotional  Stress Factors  Patient Stress Factors Exhausted  Family Stress Factors Other (Comment)

## 2015-11-06 NOTE — Discharge Summary (Signed)
[employer] [facility] [place] at [facility]   PATIENT NAME: Laurie Larsen    MR#:  [id number]  DATE OF BIRTH:  Dec 10, 1923  DATE OF ADMISSION:  11/03/2015 ADMITTING PHYSICIAN: Laurie Bilberry, MD  DATE OF DISCHARGE: 11/06/2015  PRIMARY CARE PHYSICIAN: Laurie Shams, MD    ADMISSION DIAGNOSIS:  Subdural hematoma (HCC) [I62.00] SOB (shortness of breath) [R06.02] Fall [W19.XXXA] Hypoxia [R09.02]  DISCHARGE DIAGNOSIS:  Active Problems:   Subdural hematoma (HCC)   SECONDARY DIAGNOSIS:   Past Medical History:  Diagnosis Date  . Acalculous cholecystitis   . Allergy   . Arthritis   . Back complaints   . Bronchitis   . CAD (coronary artery disease)    a. 2005 Cath: 100% dLCX-->Med managed.  . CHF (congestive heart failure) (HCC)   . Chronic diastolic CHF (congestive heart failure), NYHA class 1 (HCC)    a. 04/2013 Echo: EF 50-55%, triv MR, mildly dil LA, PASP , mild TR, mild-mod RA dil. b. echo 02/2015: EF 55-60%, mild MR, mild-mod TR, PA peak pressure 72 mm Hg  . Chronic pain    a. ? due to arthritis  . Chronic Right Pleural Effusion    a. 07/2012 s/p thoracentesis - Followed by Dr. Delford Larsen [facility]. b. 02/2015: s/p repeat R thoracentesis  . Colon polyps   . Colon polyps   . Diverticulitis large intestine   . Glaucoma   . Hay fever   . History of blood transfusion   . HTN (hypertension)   . Hyperlipidemia   . Hypothyroidism   . Permanent atrial fibrillation (HCC)    a. Chronic Coumadin.  [name] Phlebitis    a. after pacemeker placement in 2010.  . Pulmonary HTN    a. PASP on echo 04/2013. b. PASP 72 mmHg on echo in 02/2015  . Rash/skin eruption    a. Multiple episodes - wide distribution-intermittent, possibly drug rash.  . Sick sinus syndrome (HCC)    a. 05/2008 s/p MDT EnRhythm DC PPM (Laurie Larsen).  . Transfusion history    a. After childbirth 60 years ago (1957)  . Urine incontinence     HOSPITAL COURSE:   82 year old female with  past medical history of HTN, sick sinus syndrome s/p Pacemaker, Hypothyroidism, a. Fib, Hypothyroidism, chronic diastolic CHF, Pulmonary HTN came into hospital after a fall and noted to have a subdural hematoma.   1. S/p fall with subdural Hematoma - this was secondary to the mechanical fall and pt. Being on anti-coagulants (Coumadin) - pt. Was admitted to the hospital as family did not want aggressive intervention and Neurosurgery did not think she was a good surgical candidate.   - she was observed in the hospital and has had no worsening neurologic symptoms.    - repeat CT head 10/9 showing improvement in subdural hematoma and midline shift.  - pt. Is clinically stable and therefore being discharged back to SNF.  She is going to be off Coumadin Indefinitely.    2. Essential HTN - BP remained stable.   - while in the hospital the pt. Was on some PRN hydralazine.   3. Acute Resp. Failure with Hypoxia - due to CHF/Pulmonary edema. CXR showing chronic pulm. Edema.  Initially pt. Was on Hiflo Sprague and diuresed w/ IV lasix.  - she has been weaned of HiflO and is currently on [place] 3 L and switched to her Oral Lasix and is being discharged back to SNF.   4. Hypothyroidism - she will cont. Synthroid.  5. Glaucoma -  She will cont. Cosopt, Xalatan.   6. Hyperlipidemia - she will cont. Atorvastatin.    DISCHARGE CONDITIONS:   Stable  CONSULTS OBTAINED:    DRUG ALLERGIES:   Allergies  Allergen Reactions  . Amoxicillin Other (See Comments)    Reaction:  Weakness Has patient had a PCN reaction causing immediate rash, facial/tongue/throat swelling, SOB or lightheadedness with hypotension: No Has patient had a PCN reaction causing severe rash involving mucus membranes or skin necrosis: No Has patient had a PCN reaction that required hospitalization No Has patient had a PCN reaction occurring within the last 10 years: No If all of the above answers are "NO", then may proceed with  Cephalosporin use.  . Cetylpyridinium Other (See Comments)    Reaction:  Weakness   . Digoxin Other (See Comments)    Reaction:  Weakness and dehydration  . Levaquin [Levofloxacin] Itching  . Risedronate Sodium Rash    DISCHARGE MEDICATIONS:     Medication List    STOP taking these medications   warfarin 1 MG tablet Commonly known as:  COUMADIN   warfarin 5 MG tablet Commonly known as:  COUMADIN     TAKE these medications   acetaminophen 650 MG CR tablet Commonly known as:  TYLENOL Take 1,300 mg by mouth at bedtime as needed for pain.   atorvastatin 20 MG tablet Commonly known as:  LIPITOR Take 1 tablet (20 mg total) by mouth at bedtime.   COSOPT 22.3-6.8 MG/ML ophthalmic solution Generic drug:  dorzolamide-timolol Place 1 drop into both eyes 2 (two) times daily.   docusate sodium 100 MG capsule Commonly known as:  COLACE Take 1 capsule (100 mg total) by mouth 2 (two) times daily.   feeding supplement (ENSURE ENLIVE) Liqd Take 237 mLs by mouth 2 (two) times daily with a meal.   furosemide 20 MG tablet Commonly known as:  LASIX Take 1 tablet (20 mg total) by mouth 2 (two) times daily.   HYDROcodone-acetaminophen 10-325 MG tablet Commonly known as:  NORCO Take 1 tablet by mouth 3 (three) times daily as needed for moderate pain or severe pain.   ipratropium-albuterol 0.5-2.5 (3) MG/3ML Soln Commonly known as:  DUONEB Take 3 mLs by nebulization every 6 (six) hours.   levothyroxine 112 MCG tablet Commonly known as:  SYNTHROID, LEVOTHROID Take 1 tablet (112 mcg total) by mouth daily.   LORazepam 0.5 MG tablet Commonly known as:  ATIVAN Take 0.5 tablets (0.25 mg total) by mouth at bedtime.   polyethylene glycol packet Commonly known as:  MIRALAX / GLYCOLAX Take 17 g by mouth at bedtime.   potassium chloride 10 MEQ tablet Commonly known as:  K-DUR Take 1 tablet (10 mEq total) by mouth 2 (two) times daily.   senna-docusate 8.6-50 MG tablet Commonly known  as:  Senokot-S Take 1 tablet by mouth at bedtime as needed for mild constipation.   traMADol 50 MG tablet Commonly known as:  ULTRAM Take 1 tablet (50 mg total) by mouth 3 (three) times daily as needed for moderate pain. Reported on 06/20/2015   Travoprost (BAK Free) 0.004 % Soln ophthalmic solution Commonly known as:  TRAVATAN Place 1 drop into both eyes at bedtime.   Vitamin D (Ergocalciferol) 50000 units Caps capsule Commonly known as:  DRISDOL Take 50,000 Units by mouth every 7 (seven) days. Pt takes on Saturday.         DISCHARGE INSTRUCTIONS:   DIET:  Regular diet  DISCHARGE CONDITION:  Stable  ACTIVITY:  Activity as tolerated  OXYGEN:  Home Oxygen: Yes.     Oxygen Delivery: 3 liters/min via Patient connected to nasal cannula oxygen  DISCHARGE LOCATION:  nursing home   If you experience worsening of your admission symptoms, develop shortness of breath, life threatening emergency, suicidal or homicidal thoughts you must seek medical attention immediately by calling 911 or calling your MD immediately  if symptoms less severe.  You Must read complete instructions/literature along with all the possible adverse reactions/side effects for all the Medicines you take and that have been prescribed to you. Take any new Medicines after you have completely understood and accpet all the possible adverse reactions/side effects.   Please note  You were cared for by a hospitalist during your hospital stay. If you have any questions about your discharge medications or the care you received while you were in the hospital after you are discharged, you can call the unit and asked to speak with the hospitalist on call if the hospitalist that took care of you is not available. Once you are discharged, your primary care physician will handle any further medical issues. Please note that NO REFILLS for any discharge medications will be authorized once you are discharged, as it is imperative  that you return to your primary care physician (or establish a relationship with a primary care physician if you do not have one) for your aftercare needs so that they can reassess your need for medications and monitor your lab values.     Today   Headache, back pain improved.  No other acute events overnight. Not happy with the food in the hospital.   VITAL SIGNS:  Blood pressure 130/84, pulse 92, temperature 98.7 F (37.1 C), temperature source Oral, resp. rate 16, height 5\' 4"  (1.626 m), weight 49 kg (108 lb), SpO2 93 %.  I/O:   Intake/Output Summary (Last 24 hours) at 11/06/15 0929 Last data filed at 11/05/15 1800  Gross per 24 hour  Intake              240 ml  Output                0 ml  Net              240 ml    PHYSICAL EXAMINATION:   GENERAL:  82 y.o.-year-old patient sitting up in chair in no acute distress.  EYES: Pupils equal, round, reactive to light and accommodation. No scleral icterus. Extraocular muscles intact.  HEENT: Head atraumatic, normocephalic. Oropharynx and nasopharynx clear.  NECK:  Supple, no jugular venous distention. No thyroid enlargement, no tenderness.  LUNGS: Normal breath sounds bilaterally, no wheezing, rales, rhonchi. No use of accessory muscles of respiration.  CARDIOVASCULAR: S1, S2 normal. II/VI SEM at RSB, No rubs, or gallops.  ABDOMEN: Soft, nontender, nondistended. Bowel sounds present. No organomegaly or mass.  EXTREMITIES: No cyanosis, clubbing or edema b/l.    NEUROLOGIC: Cranial nerves II through XII are intact. No focal Motor or sensory deficits b/l. Globally weak.  PSYCHIATRIC: The patient is alert and oriented x 3.  SKIN: No obvious rash, lesion, or ulcer.   DATA REVIEW:   CBC  Recent Labs Lab 11/04/15 0429  WBC 10.8  HGB 11.2*  HCT 32.7*  PLT 148*    Chemistries   Recent Labs Lab 11/04/15 0429  NA 139  K 4.5  CL 106  CO2 27  GLUCOSE 103*  BUN 46*  CREATININE 0.90  CALCIUM 8.5*    Cardiac  Enzymes  Recent Labs Lab 11/03/15 1052  TROPONINI <0.03    Microbiology Results  Results for orders placed or performed during the hospital encounter of 10/24/15  MRSA PCR Screening     Status: None   Collection Time: 10/24/15 12:39 PM  Result Value Ref Range Status   MRSA by PCR NEGATIVE NEGATIVE Final    Comment:        The GeneXpert MRSA Assay (FDA approved for NASAL specimens only), is one component of a comprehensive MRSA colonization surveillance program. It is not intended to diagnose MRSA infection nor to guide or monitor treatment for MRSA infections.   Urine culture     Status: Abnormal   Collection Time: 10/24/15  1:29 PM  Result Value Ref Range Status   Specimen Description URINE, RANDOM  Final   Special Requests NONE  Final   Culture MULTIPLE SPECIES PRESENT, SUGGEST RECOLLECTION (A)  Final   Report Status 10/25/2015 FINAL  Final    RADIOLOGY:  Ct Head Wo Contrast  Result Date: 11/05/2015 CLINICAL DATA:  Subdural hematoma EXAM: CT HEAD WITHOUT CONTRAST TECHNIQUE: Contiguous axial images were obtained from the base of the skull through the vertex without intravenous contrast. COMPARISON:  11/03/2015 FINDINGS: Brain: Left subdural hematoma has decreased in thickness, now approximately 10 mm maximally posteriorly on coronal reconstructed images compared with 16 mm previously. There is increasing density noted along the posterior falx. New extra-axial hyperdense fluid noted over the posterior right occipital, likely new small subdural hematoma on the right posteriorly. Decreasing left-to-right midline shift, now approximately 4 mm compared to 6 mm previously. There is atrophy and chronic small vessel disease changes. Vascular: No hyperdense vessel or unexpected calcification. Skull: No acute calvarial abnormality. Sinuses/Orbits: Visualized paranasal sinuses and mastoids clear. Orbital soft tissues unremarkable. Other: None IMPRESSION: Decreasing size of the left  frontal/ parietal subdural hematoma. However, there is increasing hyperdense blood noted along the posterior falx and left tentorium as well as posterior to the right occipital lobe currently compatible with new or enlarging components of the subdural hematoma. Decreasing left-to-right midline shift. Atrophy, chronic small vessel disease. Electronically Signed   By: Charlett Nose M.D.   On: 11/05/2015 13:03      Management plans discussed with the patient, family and they are in agreement.  CODE STATUS:     Code Status Orders        Start     Ordered   11/03/15 1533  Do not attempt resuscitation (DNR)  Continuous    Question Answer Comment  In the event of cardiac or respiratory ARREST Do not call a "code blue"   In the event of cardiac or respiratory ARREST Do not perform Intubation, CPR, defibrillation or ACLS   In the event of cardiac or respiratory ARREST Use medication by any route, position, wound care, and other measures to relive pain and suffering. May use oxygen, suction and manual treatment of airway obstruction as needed for comfort.      11/03/15 1533    Code Status History    Date Active Date Inactive Code Status Order ID Comments User Context   10/29/2015  4:46 PM 10/30/2015  6:37 PM DNR [id number]  Newt Lukes, MD Inpatient   10/24/2015 12:47 PM 10/24/2015 12:53 PM DNR [id number]  Shaune Pollack, MD Inpatient   06/13/2015  8:09 PM 06/17/2015  3:00 PM DNR [id number]  Enedina Finner, MD Inpatient   03/25/2015 11:04 AM 03/29/2015  5:19 PM Full Code [id number]  Ramonita Lab, MD  ED   03/25/2015 11:03 AM 03/25/2015 11:04 AM DNR [id number]  Ramonita Lab, MD ED   10/17/2014  9:34 PM 10/24/2014  6:56 PM Full Code [id number]  Ricarda Frame, MD Inpatient   05/31/2014  4:55 PM 06/04/2014  5:10 PM DNR [id number]  Ramonita Lab, MD Inpatient    Advance Directive Documentation   Flowsheet Row Most Recent Value  Type of Advance Directive  Out of facility DNR (pink MOST or yellow form), Healthcare Power of  Attorney  Pre-existing out of facility DNR order (yellow form or pink MOST form)  Yellow form placed in chart (order not valid for inpatient use)  "MOST" Form in Place?  No data      TOTAL TIME TAKING CARE OF THIS PATIENT: 40 minutes.    Houston Siren M.D on 11/06/2015 at 9:29 AM  Between 7am to 6pm - Pager - [phone]  After 6pm go to [email or web address] - Social research officer, government  [employer] [facility]  Office  [phone]  CC: Primary care physician; Laurie Shams, MD

## 2015-11-06 NOTE — Consult Note (Signed)
   [facility]   11/06/2015  Laurie Larsen 16-May-1923 [id number]   Patient screened for potential [facility] Care Management services. Patient is eligible for [facility] Care [facility] Services. Electronic medical record reveals patient's discharge plan is to return to SNF. [facility] services not appropriate at this time. If patient's post hospital needs change please place a [facility] consult. For questions please contact:   Felis Quillin RN, BSN [facility]  Hospital Liaison  [phone]) Business Mobile [phone]) Toll free office

## 2015-11-06 NOTE — Progress Notes (Signed)
CSW received PASRR #. Updated FL2. Clinical Social Worker was informed that patient will be medically ready to discharge to [facility]. Patient in a agreement with plan. CSW called Marcelino DusterMichelle- Admissions Coordinator [facility] to confirm that patient's bed is ready. Provided patient's room number 340 and number to call for report [phone] . [facility] via [facility]. Rx's and DNR added to discharge packet.   Call to patient's son-Gary, to inform him [facility]. RN will call report and [facility] will [facility].  Woodroe Modehristina Gaetana Kawahara, MSW, LCSW, LCAS-A Clinical Social Worker [phone]

## 2015-11-07 ENCOUNTER — Telehealth: Payer: Self-pay

## 2015-11-07 NOTE — Telephone Encounter (Signed)
Attempted to reach patient for a TCM call, unable to leave a message or reach anyone. Will attempt again. thanks

## 2015-11-07 NOTE — Telephone Encounter (Signed)
Returned call to patient's son Jillyn HiddenGary.Dr.Jordan's recommendations given.

## 2015-11-07 NOTE — Telephone Encounter (Signed)
Spoke with Frazier ButtGary Hoston, he states that pt [facility] and [facility] and they took her off her coumadin and told them that she should never be on coumadin again so they stopped this for now. He was wondering you opinion on this, pt is on hospice and is a DNR. Please advise.

## 2015-11-07 NOTE — Telephone Encounter (Signed)
Yes she suffered a subdural hematoma so she should not take coumadin or other blood thinners now.  Peter SwazilandJordan MD, [facility]

## 2015-11-08 ENCOUNTER — Telehealth: Payer: Self-pay

## 2015-11-08 NOTE — Telephone Encounter (Signed)
Received notification from radiology staff that patient was a no show for her T-tube cholangiogram. Patient was currently admitted to hospital for Subdural Hematoma status post fall and is now a hospice patient and DNR.  We will be glad to facilitate rescheduling this should the patient want to do that at a later time.

## 2015-11-14 ENCOUNTER — Ambulatory Visit: Payer: Self-pay | Admitting: Cardiology

## 2015-11-14 DIAGNOSIS — I482 Chronic atrial fibrillation, unspecified: Secondary | ICD-10-CM

## 2015-11-20 ENCOUNTER — Non-Acute Institutional Stay (SKILLED_NURSING_FACILITY): Payer: Medicare Other | Admitting: Gerontology

## 2015-11-20 DIAGNOSIS — I6200 Nontraumatic subdural hemorrhage, unspecified: Secondary | ICD-10-CM

## 2015-11-20 DIAGNOSIS — I5032 Chronic diastolic (congestive) heart failure: Secondary | ICD-10-CM

## 2015-11-20 DIAGNOSIS — F411 Generalized anxiety disorder: Secondary | ICD-10-CM

## 2015-11-20 DIAGNOSIS — G894 Chronic pain syndrome: Secondary | ICD-10-CM

## 2015-11-20 DIAGNOSIS — S065X9A Traumatic subdural hemorrhage with loss of consciousness of unspecified duration, initial encounter: Secondary | ICD-10-CM

## 2015-11-20 DIAGNOSIS — S065XAA Traumatic subdural hemorrhage with loss of consciousness status unknown, initial encounter: Secondary | ICD-10-CM

## 2015-11-22 LAB — COMPREHENSIVE METABOLIC PANEL
ALT: 17 U/L (ref 14–54)
ANION GAP: 10 (ref 5–15)
AST: 26 U/L (ref 15–41)
Albumin: 3.1 g/dL — ABNORMAL LOW (ref 3.5–5.0)
Alkaline Phosphatase: 101 U/L (ref 38–126)
BILIRUBIN TOTAL: 0.9 mg/dL (ref 0.3–1.2)
BUN: 29 mg/dL — ABNORMAL HIGH (ref 6–20)
CHLORIDE: 101 mmol/L (ref 101–111)
CO2: 24 mmol/L (ref 22–32)
Calcium: 8.6 mg/dL — ABNORMAL LOW (ref 8.9–10.3)
Creatinine, Ser: 0.82 mg/dL (ref 0.44–1.00)
Glucose, Bld: 74 mg/dL (ref 65–99)
POTASSIUM: 4 mmol/L (ref 3.5–5.1)
Sodium: 135 mmol/L (ref 135–145)
TOTAL PROTEIN: 6.2 g/dL — AB (ref 6.5–8.1)

## 2015-11-22 LAB — TSH: TSH: 3.749 u[IU]/mL (ref 0.350–4.500)

## 2015-11-22 LAB — VITAMIN B12: VITAMIN B 12: 726 pg/mL (ref 180–914)

## 2015-11-22 LAB — MAGNESIUM: MAGNESIUM: 2 mg/dL (ref 1.7–2.4)

## 2015-11-23 LAB — VITAMIN D 25 HYDROXY (VIT D DEFICIENCY, FRACTURES): VIT D 25 HYDROXY: 55.5 ng/mL (ref 30.0–100.0)

## 2015-11-28 ENCOUNTER — Encounter
Admission: RE | Admit: 2015-11-28 | Discharge: 2015-11-28 | Disposition: A | Discharge status: Home / Self Care | Payer: Medicare Other | Source: Ambulatory Visit | Attending: Internal Medicine | Admitting: Internal Medicine

## 2015-11-30 DIAGNOSIS — Z66 Do not resuscitate: Secondary | ICD-10-CM | POA: Diagnosis not present

## 2015-11-30 DIAGNOSIS — R627 Adult failure to thrive: Secondary | ICD-10-CM | POA: Diagnosis not present

## 2015-11-30 DIAGNOSIS — Z515 Encounter for palliative care: Secondary | ICD-10-CM | POA: Diagnosis not present

## 2015-11-30 DIAGNOSIS — I2510 Atherosclerotic heart disease of native coronary artery without angina pectoris: Secondary | ICD-10-CM | POA: Diagnosis not present

## 2015-11-30 DIAGNOSIS — J9610 Chronic respiratory failure, unspecified whether with hypoxia or hypercapnia: Secondary | ICD-10-CM | POA: Diagnosis not present

## 2015-11-30 DIAGNOSIS — I4891 Unspecified atrial fibrillation: Secondary | ICD-10-CM | POA: Diagnosis not present

## 2015-11-30 DIAGNOSIS — I272 Pulmonary hypertension, unspecified: Secondary | ICD-10-CM | POA: Diagnosis not present

## 2015-11-30 DIAGNOSIS — F411 Generalized anxiety disorder: Secondary | ICD-10-CM | POA: Insufficient documentation

## 2015-11-30 DIAGNOSIS — I5032 Chronic diastolic (congestive) heart failure: Secondary | ICD-10-CM | POA: Diagnosis not present

## 2015-11-30 NOTE — Progress Notes (Signed)
Location:      Place of Service:  SNF (31) Provider:  Toni Arthurs, NP-C  Crecencio Mc, MD  Patient Care Team: Crecencio Mc, MD as PCP - General (Internal Medicine) Peter M Martinique, MD (Cardiology) Thompson Grayer, MD (Cardiology) Richmond Campbell, MD (Gastroenterology) Prentiss Bells, MD (Ophthalmology) Druscilla Brownie, MD (Dermatology) Suella Broad, MD (Physical Medicine and Rehabilitation) Hulan Fess, MD as Attending Physician [facility])  Extended Emergency Contact Information Primary Emergency Contact: Charolette Child of [place] Phone: [phone] Mobile Phone: [phone] Relation: Son Secondary Emergency Contact: Chauca,Roger  [place] Mobile Phone: [phone] Relation: Son  Code Status:  dnr Goals of care: Advanced Directive information Advanced Directives 11/03/2015  Does patient have an advance directive? Yes  Type of Advance Directive Out of facility DNR (pink MOST or yellow form);Healthcare Power of Attorney  Does patient want to make changes to advanced directive? No - Patient declined  Copy of advanced directive(s) in chart? Yes  Pre-existing out of facility DNR order (yellow form or pink MOST form) -     Chief Complaint  Patient presents with  . Acute Visit    HPI:  Pt is a 82 y.o. female seen today for an acute visit for leg edema, severe pain/ headache r/t SDH as well as chronic back pain, and anxiety. Pt had a recent fall with resulting SDH. Pt was on Warfarin for afib, but medication was stopped due to brain bleed. Pt continues to have severe, localized headaches in the area of the SDH- causing photosensitivity, decreased appetite. Pain is dull, ache most all the time with the occasional sharp, shooting pain. This is negatively affecting the pt's quality of life. Pt also is concerned about difficulty sleeping and difficulty concentrating. She is concerned about her husband and his health. She reports having  constant worry and feels "on edge." She also has chronic back pain. Nursing reports that the pt would take "a lot" of her husbands pain meds when she was in the apartment. Now, she is on a restricted amount of pain meds and her pain is severe; not being managed. Pt is crying at times, calling family, etc about the pain. Pt reports she is miserable and "is ready to go." We discussed Palliative Care seeing the pt while she is still on rehab and then transition back to Hospice when rehab stopped, for sx management of pain and dyspnea with minimal exertion. Pt is agreeable. Pt is also agreeable to medication changes to attempt to control symptoms. Pt also c/o edema to BLE. She does say they are better today than what they had been. She has been attempting to elevate them as much as she can, though she is sitting in a chair, leaned forward to facilitate breathing. Pt reports some, non-productive cough/ congestion. No fever. Faint wheeze in BUL. No other complaints. VSS   Past Medical History:  Diagnosis Date  . Acalculous cholecystitis   . Allergy   . Arthritis   . Back complaints   . Bronchitis   . CAD (coronary artery disease)    a. 2005 Cath: 100% dLCX-->Med managed.  . CHF (congestive heart failure) ([place])   . Chronic diastolic CHF (congestive heart failure), NYHA class 1 ([place])    a. 04/2013 Echo: EF 50-55%, triv MR, mildly dil LA, PASP 50mHg, mild TR, mild-mod RA dil. b. echo 02/2015: EF 55-60%, mild MR, mild-mod TR, PA peak pressure 72 mm Hg  . Chronic pain    a. ?  due to arthritis  . Chronic Right Pleural Effusion    a. 07/2012 s/p thoracentesis - Followed by Dr. Joya Gaskins [facility]. b. 02/2015: s/p repeat R thoracentesis  . Colon polyps   . Colon polyps   . Diverticulitis large intestine   . Glaucoma   . Hay fever   . History of blood transfusion   . HTN (hypertension)   . Hyperlipidemia   . Hypothyroidism   . Permanent atrial fibrillation ([place])    a. Chronic Coumadin.  [name]  Phlebitis    a. after pacemeker placement in 2010.  . Pulmonary HTN    a. PASP 74mHg on echo 04/2013. b. PASP 72 mmHg on echo in 02/2015  . Rash/skin eruption    a. Multiple episodes - wide distribution-intermittent, possibly drug rash.  . Sick sinus syndrome ([place]    a. 05/2008 s/p MDT EnRhythm DC PPM ([place]).  . Transfusion history    a. After childbirth 60 years ago (1957)  . Urine incontinence    Past Surgical History:  Procedure Laterality Date  . ABDOMINAL HYSTERECTOMY  1977  . ANKLE FRACTURE SURGERY  1979   Left, pin  . APPENDECTOMY  1939  . BREAST BIOPSY  2011  . BREAST FIBROADENOMA SURGERY  2010   Right  . BREAST SURGERY     Needle guided excision, right breast calcification  . IR GENERIC HISTORICAL  10/19/2015   IR CHOLANGIOGRAM EXISTING TUBE 10/19/2015 MGreggory Keen MD [facility]-INTERV RAD  . KMcNary  Right  . PACEMAKER INSERTION  06/20/08   MDT EnRhythm DR implanted by Dr KVerlon Setting . SKIN GRAFT     left leg  . TONSILLECTOMY  82 yrs old    Allergies  Allergen Reactions  . Amoxicillin Other (See Comments)    Reaction:  Weakness Has patient had a PCN reaction causing immediate rash, facial/tongue/throat swelling, SOB or lightheadedness with hypotension: No Has patient had a PCN reaction causing severe rash involving mucus membranes or skin necrosis: No Has patient had a PCN reaction that required hospitalization No Has patient had a PCN reaction occurring within the last 10 years: No If all of the above answers are "NO", then may proceed with Cephalosporin use.  . Cetylpyridinium Other (See Comments)    Reaction:  Weakness   . Digoxin Other (See Comments)    Reaction:  Weakness and dehydration  . Levaquin [Levofloxacin] Itching  . Risedronate Sodium Rash      Medication List       Accurate as of 11/20/15 11:59 PM. Always use your most recent med list.          acetaminophen 650 MG CR tablet Commonly known as:  TYLENOL Take 1,300 mg by mouth  at bedtime as needed for pain.   atorvastatin 20 MG tablet Commonly known as:  LIPITOR Take 1 tablet (20 mg total) by mouth at bedtime.   COSOPT 22.3-6.8 MG/ML ophthalmic solution Generic drug:  dorzolamide-timolol Place 1 drop into both eyes 2 (two) times daily.   docusate sodium 100 MG capsule Commonly known as:  COLACE Take 1 capsule (100 mg total) by mouth 2 (two) times daily.   feeding supplement (ENSURE ENLIVE) Liqd Take 237 mLs by mouth 2 (two) times daily with a meal.   furosemide 20 MG tablet Commonly known as:  LASIX Take 1 tablet (20 mg total) by mouth 2 (two) times daily.   HYDROcodone-acetaminophen 10-325 MG tablet Commonly known as:  NORCO Take 1 tablet by  mouth 3 (three) times daily as needed for moderate pain or severe pain.   ipratropium-albuterol 0.5-2.5 (3) MG/3ML Soln Commonly known as:  DUONEB Take 3 mLs by nebulization every 6 (six) hours.   levothyroxine 112 MCG tablet Commonly known as:  SYNTHROID, LEVOTHROID Take 1 tablet (112 mcg total) by mouth daily.   LORazepam 0.5 MG tablet Commonly known as:  ATIVAN Take 0.5 tablets (0.25 mg total) by mouth at bedtime.   polyethylene glycol packet Commonly known as:  MIRALAX / GLYCOLAX Take 17 g by mouth at bedtime.   potassium chloride 10 MEQ tablet Commonly known as:  K-DUR Take 1 tablet (10 mEq total) by mouth 2 (two) times daily.   senna-docusate 8.6-50 MG tablet Commonly known as:  Senokot-S Take 1 tablet by mouth at bedtime as needed for mild constipation.   traMADol 50 MG tablet Commonly known as:  ULTRAM Take 1 tablet (50 mg total) by mouth 3 (three) times daily as needed for moderate pain. Reported on 06/20/2015   Travoprost (BAK Free) 0.004 % Soln ophthalmic solution Commonly known as:  TRAVATAN Place 1 drop into both eyes at bedtime.   Vitamin D (Ergocalciferol) 50000 units Caps capsule Commonly known as:  DRISDOL Take 50,000 Units by mouth every 7 (seven) days. Pt takes on  Saturday.       Review of Systems  Constitutional: Positive for activity change and fatigue. Negative for appetite change, chills, diaphoresis and fever.  HENT: Positive for congestion. Negative for sneezing, sore throat, trouble swallowing and voice change.   Eyes: Negative for pain, redness and visual disturbance.  Respiratory: Positive for cough, shortness of breath and wheezing. Negative for apnea, choking and chest tightness.   Cardiovascular: Positive for leg swelling. Negative for chest pain and palpitations.  Gastrointestinal: Negative for abdominal distention, abdominal pain, constipation, diarrhea and nausea.  Genitourinary: Negative for difficulty urinating, dysuria, frequency and urgency.  Musculoskeletal: Positive for arthralgias (typical arthritis), back pain and myalgias. Negative for gait problem.  Skin: Positive for pallor. Negative for color change, rash and wound.  Neurological: Positive for weakness and headaches. Negative for dizziness, tremors, seizures, syncope, facial asymmetry, speech difficulty, light-headedness and numbness.  Psychiatric/Behavioral: Negative for agitation and behavioral problems.  All other systems reviewed and are negative.   Immunization History  Administered Date(s) Administered  . Influenza Split 11/16/2012  . Influenza,inj,Quad PF,36+ Mos 09/28/2013, 10/18/2014  . PPD Test 10/22/2010  . Pneumococcal Conjugate-13 02/20/2015  . Pneumococcal-Unspecified 01/28/2011, 02/19/2014   Pertinent  Health Maintenance Due  Topic Date Due  . INFLUENZA VACCINE  08/28/2015  . DEXA SCAN  Completed  . PNA vac Low Risk Adult  Completed   Fall Risk  08/17/2015 07/17/2015 07/03/2015 05/01/2015  Falls in the past year? No No No No  Risk for fall due to : - - - Other (Comment)  Risk for fall due to (comments): - - - use walker becasue of back- help straighter    Functional Status Survey:    Vitals:   11/19/15 1300  BP: 111/77  Pulse: (!) 107  Resp: 18   Temp: 97.1 F (36.2 C)  Weight: 113 lb (51.3 kg)   Body mass index is 19.4 kg/m. Physical Exam  Constitutional: She is oriented to person, place, and time. Vital signs are normal. She appears well-developed and well-nourished. She is active and cooperative. She does not appear ill. No distress. Nasal cannula in place.  HENT:  Head: Normocephalic and atraumatic.  Mouth/Throat: Uvula is midline, oropharynx is  clear and moist and mucous membranes are normal. Mucous membranes are not pale, not dry and not cyanotic.  Eyes: Conjunctivae, EOM and lids are normal. Pupils are equal, round, and reactive to light.  Neck: Trachea normal, normal range of motion and full passive range of motion without pain. Neck supple. JVD present. No tracheal deviation, no edema and no erythema present. No thyromegaly present.  Cardiovascular: Normal rate, intact distal pulses and normal pulses.  An irregular rhythm present. Exam reveals no gallop, no distant heart sounds and no friction rub.   No murmur heard. Pulmonary/Chest: Effort normal. No accessory muscle usage. No respiratory distress. She has decreased breath sounds in the right lower field and the left lower field. She has wheezes in the right upper field and the left upper field. She has no rhonchi. She has no rales. She exhibits no tenderness.  Abdominal: Normal appearance and bowel sounds are normal. She exhibits no distension and no ascites. There is no tenderness.  Musculoskeletal: Normal range of motion. She exhibits no edema or tenderness.  Expected osteoarthritis, stiffness  Neurological: She is alert and oriented to person, place, and time. She has normal strength.  Skin: Skin is warm, dry and intact. She is not diaphoretic. No cyanosis. There is pallor. Nails show no clubbing.  Psychiatric: Her speech is normal. Judgment and thought content normal. Her mood appears anxious. She is withdrawn. Cognition and memory are normal. She exhibits a depressed  mood.  Nursing note and vitals reviewed.   Labs reviewed:  Recent Labs  10/24/15 0909  11/03/15 1052 11/04/15 0429 11/22/15 0758  NA 134*  < > 139 139 135  K 5.6*  < > 3.7 4.5 4.0  CL 104  < > 106 106 101  CO2 17*  < > _0 GLUCOSE 153*  < > 132* 103* 74  BUN 41*  < > 38* 46* 29*  CREATININE 1.82*  < > 0.78 0.90 0.82  CALCIUM 8.4*  < > 8.6* 8.5* 8.6*  MG 2.3  --   --   --  2.0  < > = values in this interval not displayed.  Recent Labs  08/17/15 1550 10/24/15 0909 11/22/15 0758  AST 24 36 26  ALT _1 ALKPHOS 84 92 101  BILITOT 0.5 0.6 0.9  PROT 6.4 6.7 6.2*  ALBUMIN 3.7 3.6 3.1*    Recent Labs  08/17/15 1550 10/24/15 0909  10/30/15 0633 11/03/15 1052 11/04/15 0429  WBC 5.1 10.9  < > 7.5 8.5 10.8  NEUTROABS 3,774 8.2*  --   --  7.3*  --   HGB 11.9 14.3  < > 12.8 12.8 11.2*  HCT 37.2 42.9  < > 37.3 37.2 32.7*  MCV 95.4 94.3  < > 90.5 91.6 92.0  PLT 178 221  < > 143* 145* 148*  < > = values in this interval not displayed. Lab Results  Component Value Date   TSH 3.749 11/22/2015   No results found for: HGBA1C Lab Results  Component Value Date   CHOL 117 06/01/2014   HDL 43 06/01/2014   LDLCALC 60 06/01/2014   TRIG 68 06/01/2014   CHOLHDL 2.7 06/01/2014    Significant Diagnostic Results in last 30 days:  Dg Chest 1 View  Result Date: 11/03/2015 CLINICAL DATA:  [place] today.  History of congestive heart failure. EXAM: CHEST 1 VIEW COMPARISON:  10/26/2015 FINDINGS: Dual lead pacemaker remains in place. The heart is enlarged. There is aortic atherosclerosis.  There is pulmonary venous hypertension with mild interstitial edema. There are bilateral effusions with dependent pulmonary atelectasis. Findings are similar to the previous study and consistent with chronic congestive heart failure. IMPRESSION: Chronic congestive heart failure.  No change since 10/26/2015. Electronically Signed   By: Nelson Chimes M.D.   On: 11/03/2015 16:13   Dg Thoracic Spine  2 View  Result Date: 11/03/2015 CLINICAL DATA:  Fall this morning with mid back pain and bruising in the upper back at roughly the T6 - 8 level. Initial encounter. EXAM: THORACIC SPINE 2 VIEWS COMPARISON:  Lateral view from a chest x-ray dated 08/17/2015 and frontal chest radiograph on 10/26/2015. FINDINGS: No acute fracture or subluxation identified. Bones are osteopenic and mild spondylosis present throughout the thoracic spine. There is minimal loss of height of the anterior aspect of the T11 vertebral body. No bony lesions identified. Frontal film shows opacity in the right lower chest which may relate to atelectasis. Component of infiltrate cannot be excluded. Degree of pleural fluid appears less prominent. IMPRESSION: No acute thoracic fracture identified. Mild loss of height of T11. Probable atelectasis with some residual pleural fluid on the right. Cannot exclude right lower lung infiltrate. Electronically Signed   By: Aletta Edouard M.D.   On: 11/03/2015 12:25   Dg Lumbar Spine 2-3 Views  Result Date: 11/03/2015 CLINICAL DATA:  Fall this morning with mid back pain. Initial encounter. EXAM: LUMBAR SPINE - 2-3 VIEW COMPARISON:  CT of the abdomen and pelvis on 10/17/2014 FINDINGS: No acute fracture identified. Advanced lumbar spondylosis with associated degenerative scoliosis present. The most severe disc disease is at L3-4. There is mild loss of height of both L3 and L4 vertebral bodies. Bones are osteopenic. IMPRESSION: No acute fracture identified. Advanced spondylosis, scoliosis and mild loss of height at L3 and L4. Electronically Signed   By: Aletta Edouard M.D.   On: 11/03/2015 12:32   Ct Head Wo Contrast  Result Date: 11/05/2015 CLINICAL DATA:  Subdural hematoma EXAM: CT HEAD WITHOUT CONTRAST TECHNIQUE: Contiguous axial images were obtained from the base of the skull through the vertex without intravenous contrast. COMPARISON:  11/03/2015 FINDINGS: Brain: Left subdural hematoma has decreased  in thickness, now approximately 10 mm maximally posteriorly on coronal reconstructed images compared with 16 mm previously. There is increasing density noted along the posterior falx. New extra-axial hyperdense fluid noted over the posterior right occipital, likely new small subdural hematoma on the right posteriorly. Decreasing left-to-right midline shift, now approximately 4 mm compared to 6 mm previously. There is atrophy and chronic small vessel disease changes. Vascular: No hyperdense vessel or unexpected calcification. Skull: No acute calvarial abnormality. Sinuses/Orbits: Visualized paranasal sinuses and mastoids clear. Orbital soft tissues unremarkable. Other: None IMPRESSION: Decreasing size of the left frontal/ parietal subdural hematoma. However, there is increasing hyperdense blood noted along the posterior falx and left tentorium as well as posterior to the right occipital lobe currently compatible with new or enlarging components of the subdural hematoma. Decreasing left-to-right midline shift. Atrophy, chronic small vessel disease. Electronically Signed   By: Rolm Baptise M.D.   On: 11/05/2015 13:03   Ct Head Wo Contrast  Result Date: 11/03/2015 CLINICAL DATA:  82 year old female with a history of fall EXAM: CT HEAD WITHOUT CONTRAST CT CERVICAL SPINE WITHOUT CONTRAST TECHNIQUE: Multidetector CT imaging of the head and cervical spine was performed following the standard protocol without intravenous contrast. Multiplanar CT image reconstructions of the cervical spine were also generated. COMPARISON:  09/06/2011, chest CT 03/27/2015  FINDINGS: CT HEAD FINDINGS Brain: Acute left-sided mixed density subdural hemorrhage layered over the convexity extending from the apex over the frontal and parietal convexities. Coronal images demonstrate the greatest thickness measuring 1.6 cm posteriorly with associated mass effect. Greatest thickness anteriorly over the frontal lobe approximately 1 cm on coronal  reformatted images. There is associated midline shift with approximately 6 mm of left-to-right shift at the foramen of Monro. Uniform thickness subdural layered along the falx, extending anteriorly into the interhemispheric region and posteriorly along the left tentorium. Configuration the ventricles unchanged. Gray-white differentiation relatively maintained. Mild confluent hypodensity in the periventricular white matter. Vascular: Intracranial atherosclerosis. Skull: No fracture identified. Sinuses/Orbits: Left-sided mastoid air cell opacification. Left middle ear fluid. Other: None CT CERVICAL SPINE FINDINGS Alignment: Craniocervical junction aligned. Anatomic alignment of the cervical elements. No subluxation. Skull base and vertebrae: No acute fracture at the skullbase. Vertebral body heights relatively maintained. No acute fracture identified. Soft tissues and spinal canal: No canal hematoma. Dense calcifications of bilateral carotid arteries. Lobulated soft tissue associated with the epiglottis. This was present on a prior CT of the chest 03/19/2015, now enlarged, measuring 13 mm. Disc levels: Disc space narrowing throughout the cervical spine, most pronounced at the level of C5-C6, C6-C7 where there is endplate sclerosis. Posterior disc osteophyte complex at C3-C4 and C7-T1. These posterior central disc osteophyte complex may contact the ventral surface of the cord. Upper chest: Unremarkable appearance of the lung apices. Other: Bilateral facet disease. IMPRESSION: Head CT: Acute mixed density left-sided subdural hemorrhage, involving the entire cerebral convexity extending from the vertex inferiorly to the temporal lobe. Greatest degree of mass effect is posteriorly Wo overlying the parietal lobe, were the thickness measures 1.6 cm. There is left to right midline shift at the foramen of Monro measuring approximately 6 mm. Additional subdural along the midline falx anteriorly at the inter hemispheric region  and posteriorly along the left tentorium. These results were called by telephone at the time of interpretation on 11/03/2015 at 1:41 pm to Dr. Lisa Roca , who verbally acknowledged these results. Cervical CT: No acute fracture or malalignment of the cervical spine. Degenerative changes of disc disease, with posterior disc osteophyte complex at C3-C4 and C7-T1, which may contact the anterior surface of the cord at each site. Lobulated soft tissue along the tip of the epiglottis, appears to been present on prior chest CT and measures larger as great as 1.3 cm. This may represent a polyp or benign tumor, however, carcinoma not excluded. Referral for an ENT evaluation and potentially laryngoscopy is recommended. This result was discussed at the time of interpretation on 11/03/2015 at 1:42 pm with Dr. Lisa Roca , who verbally acknowledged these results. Signed, Dulcy Fanny. Earleen Newport, DO [facility] Electronically Signed   By: Corrie Mckusick D.O.   On: 11/03/2015 13:43   Ct Cervical Spine Wo Contrast  Result Date: 11/03/2015 CLINICAL DATA:  82 year old female with a history of fall EXAM: CT HEAD WITHOUT CONTRAST CT CERVICAL SPINE WITHOUT CONTRAST TECHNIQUE: Multidetector CT imaging of the head and cervical spine was performed following the standard protocol without intravenous contrast. Multiplanar CT image reconstructions of the cervical spine were also generated. COMPARISON:  09/06/2011, chest CT 03/27/2015 FINDINGS: CT HEAD FINDINGS Brain: Acute left-sided mixed density subdural hemorrhage layered over the convexity extending from the apex over the frontal and parietal convexities. Coronal images demonstrate the greatest thickness measuring 1.6 cm posteriorly with associated mass effect. Greatest thickness anteriorly over the  frontal lobe approximately 1 cm on coronal reformatted images. There is associated midline shift with approximately 6 mm of  left-to-right shift at the foramen of Monro. Uniform thickness subdural layered along the falx, extending anteriorly into the interhemispheric region and posteriorly along the left tentorium. Configuration the ventricles unchanged. Gray-white differentiation relatively maintained. Mild confluent hypodensity in the periventricular white matter. Vascular: Intracranial atherosclerosis. Skull: No fracture identified. Sinuses/Orbits: Left-sided mastoid air cell opacification. Left middle ear fluid. Other: None CT CERVICAL SPINE FINDINGS Alignment: Craniocervical junction aligned. Anatomic alignment of the cervical elements. No subluxation. Skull base and vertebrae: No acute fracture at the skullbase. Vertebral body heights relatively maintained. No acute fracture identified. Soft tissues and spinal canal: No canal hematoma. Dense calcifications of bilateral carotid arteries. Lobulated soft tissue associated with the epiglottis. This was present on a prior CT of the chest 03/19/2015, now enlarged, measuring 13 mm. Disc levels: Disc space narrowing throughout the cervical spine, most pronounced at the level of C5-C6, C6-C7 where there is endplate sclerosis. Posterior disc osteophyte complex at C3-C4 and C7-T1. These posterior central disc osteophyte complex may contact the ventral surface of the cord. Upper chest: Unremarkable appearance of the lung apices. Other: Bilateral facet disease. IMPRESSION: Head CT: Acute mixed density left-sided subdural hemorrhage, involving the entire cerebral convexity extending from the vertex inferiorly to the temporal lobe. Greatest degree of mass effect is posteriorly Wo overlying the parietal lobe, were the thickness measures 1.6 cm. There is left to right midline shift at the foramen of Monro measuring approximately 6 mm. Additional subdural along the midline falx anteriorly at the inter hemispheric region and posteriorly along the left tentorium. These results were called by telephone  at the time of interpretation on 11/03/2015 at 1:41 pm to Dr. Lisa Roca , who verbally acknowledged these results. Cervical CT: No acute fracture or malalignment of the cervical spine. Degenerative changes of disc disease, with posterior disc osteophyte complex at C3-C4 and C7-T1, which may contact the anterior surface of the cord at each site. Lobulated soft tissue along the tip of the epiglottis, appears to been present on prior chest CT and measures larger as great as 1.3 cm. This may represent a polyp or benign tumor, however, carcinoma not excluded. Referral for an ENT evaluation and potentially laryngoscopy is recommended. This result was discussed at the time of interpretation on 11/03/2015 at 1:42 pm with Dr. Lisa Roca , who verbally acknowledged these results. Signed, Dulcy Fanny. Earleen Newport, DO [facility] Electronically Signed   By: Corrie Mckusick D.O.   On: 11/03/2015 13:43   Dg Hand Complete Left  Result Date: 11/03/2015 CLINICAL DATA:  Fall this morning. Left hand pain, bruising, and tenderness. EXAM: LEFT HAND - COMPLETE 3+ VIEW COMPARISON:  None. FINDINGS: There is no evidence of fracture or dislocation. Diffuse osteopenia noted. Advanced osteoarthritis seen involving the base of the thumb. No other osseous abnormality identified. IMPRESSION: No acute findings. Advanced osteoarthritis involving base of thumb.  Osteopenia. Electronically Signed   By: Earle Gell M.D.   On: 11/03/2015 13:33    Assessment/Plan 1. Chronic diastolic congestive heart failure, NYHA class 1 (HCC)  Increase PO lasix to 60 mg po BID  Lasix 40 mg IM x 1 for edema  2. Subdural hematoma (HCC)  Schedule Imitrex 50 mg po BID for headache associated with SDH  No anticoagulants  Scheduled tylenol  3. Chronic pain syndrome  DC Hydrocodone  DC APAP ER  Schedule APAP 650  mg po QID for pain  Oxycodone 10 mg po QID scheduled and 1 tablet PO Q 3 hours prn  severe pain- Rx #120, no refills  Palliative Care Consult  4. Generalized anxiety disorder  Continue lorazepam 0.5 mg (1/2 tablet) Q HS  Buspirone 7.5 mg po BID  DC xanax    Family/ staff Communication:   Total Time: 40 minutes  Documentation: 10 minutes  Face to Face: 30 minutes  Family/Phone: Husband present at bedside during interview and assessment   Labs/tests ordered:  Cbc, met c, lipid panel, tsh, b12, d, mag+  Medication list reviewed and assessed for continued appropriateness.  Vikki Ports, NP-C Geriatric[facility] [id number], [place] [address] Cell Phone (Mon-Fri 8am-5pm):  [phone] On Call:  [phone] & follow prompts after 5pm & weekends Office Phone:  [phone] Office Fax:  [phone]

## 2015-12-12 ENCOUNTER — Ambulatory Visit: Payer: Medicare Other | Admitting: Internal Medicine

## 2015-12-17 ENCOUNTER — Telehealth: Payer: Self-pay | Admitting: *Deleted

## 2015-12-17 NOTE — Telephone Encounter (Signed)
Please generate a sympathy card that I can sign

## 2015-12-17 NOTE — Telephone Encounter (Signed)
FYI Son [place], stated that pt passed away on 11/18

## 2015-12-18 NOTE — Telephone Encounter (Signed)
Placed in your Quick sign folder.

## 2015-12-25 ENCOUNTER — Telehealth: Payer: Self-pay | Admitting: Internal Medicine

## 2015-12-25 NOTE — Telephone Encounter (Addendum)
Thank you for making me aware.

## 2015-12-25 NOTE — Telephone Encounter (Signed)
Pt passed away. Thank you!

## 2015-12-28 ENCOUNTER — Encounter: Admission: RE | Admit: 2015-12-28 | Payer: Medicare Other | Source: Ambulatory Visit | Admitting: Internal Medicine

## 2015-12-28 DEATH — deceased

## 2016-01-01 ENCOUNTER — Ambulatory Visit: Payer: Medicare Other | Admitting: Cardiology

## 2016-05-02 IMAGING — CR DG CHEST 2V
1 series · 2 of 2 positions shown · non-contrast
Comparison: 09/12/2013

CLINICAL DATA: RIGHT pleural effusion, pacemaker, back pain

EXAM:
CHEST  2 VIEW

[Series 1: x chest ap · 0.14mm/px · 2 of 2 slices shown]
[im 1/2]
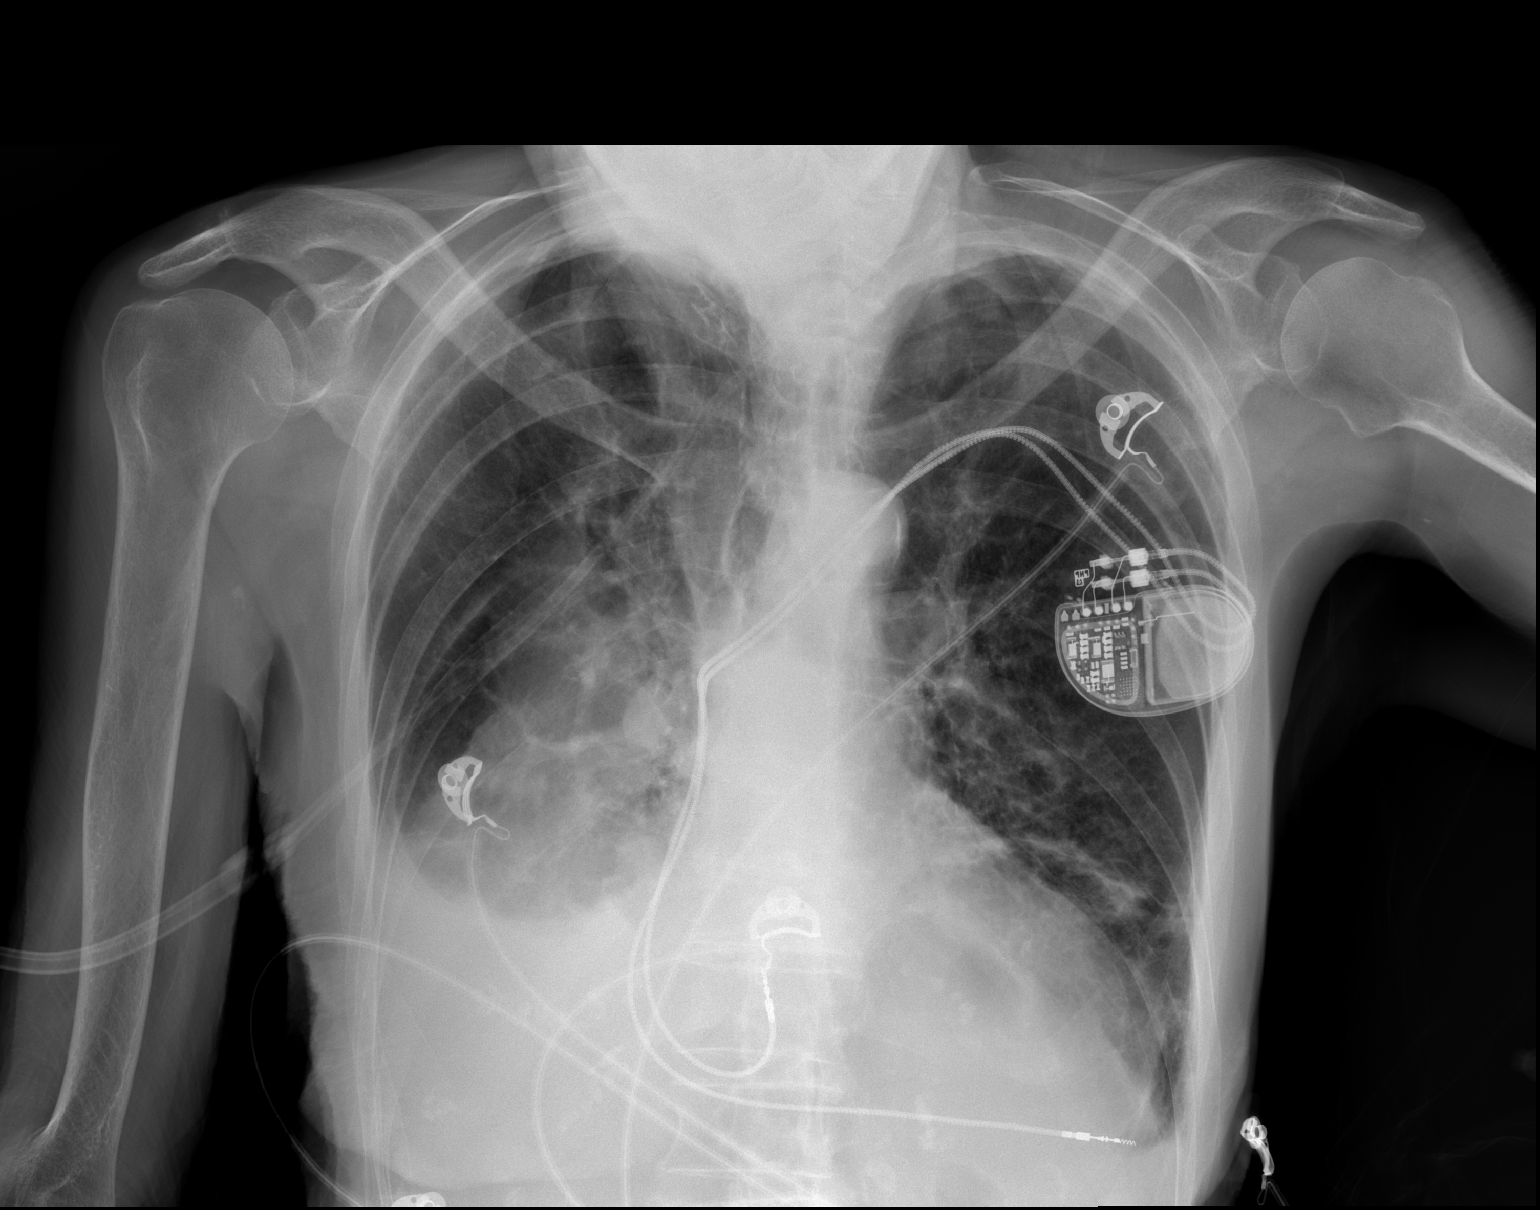
[im 2/2]
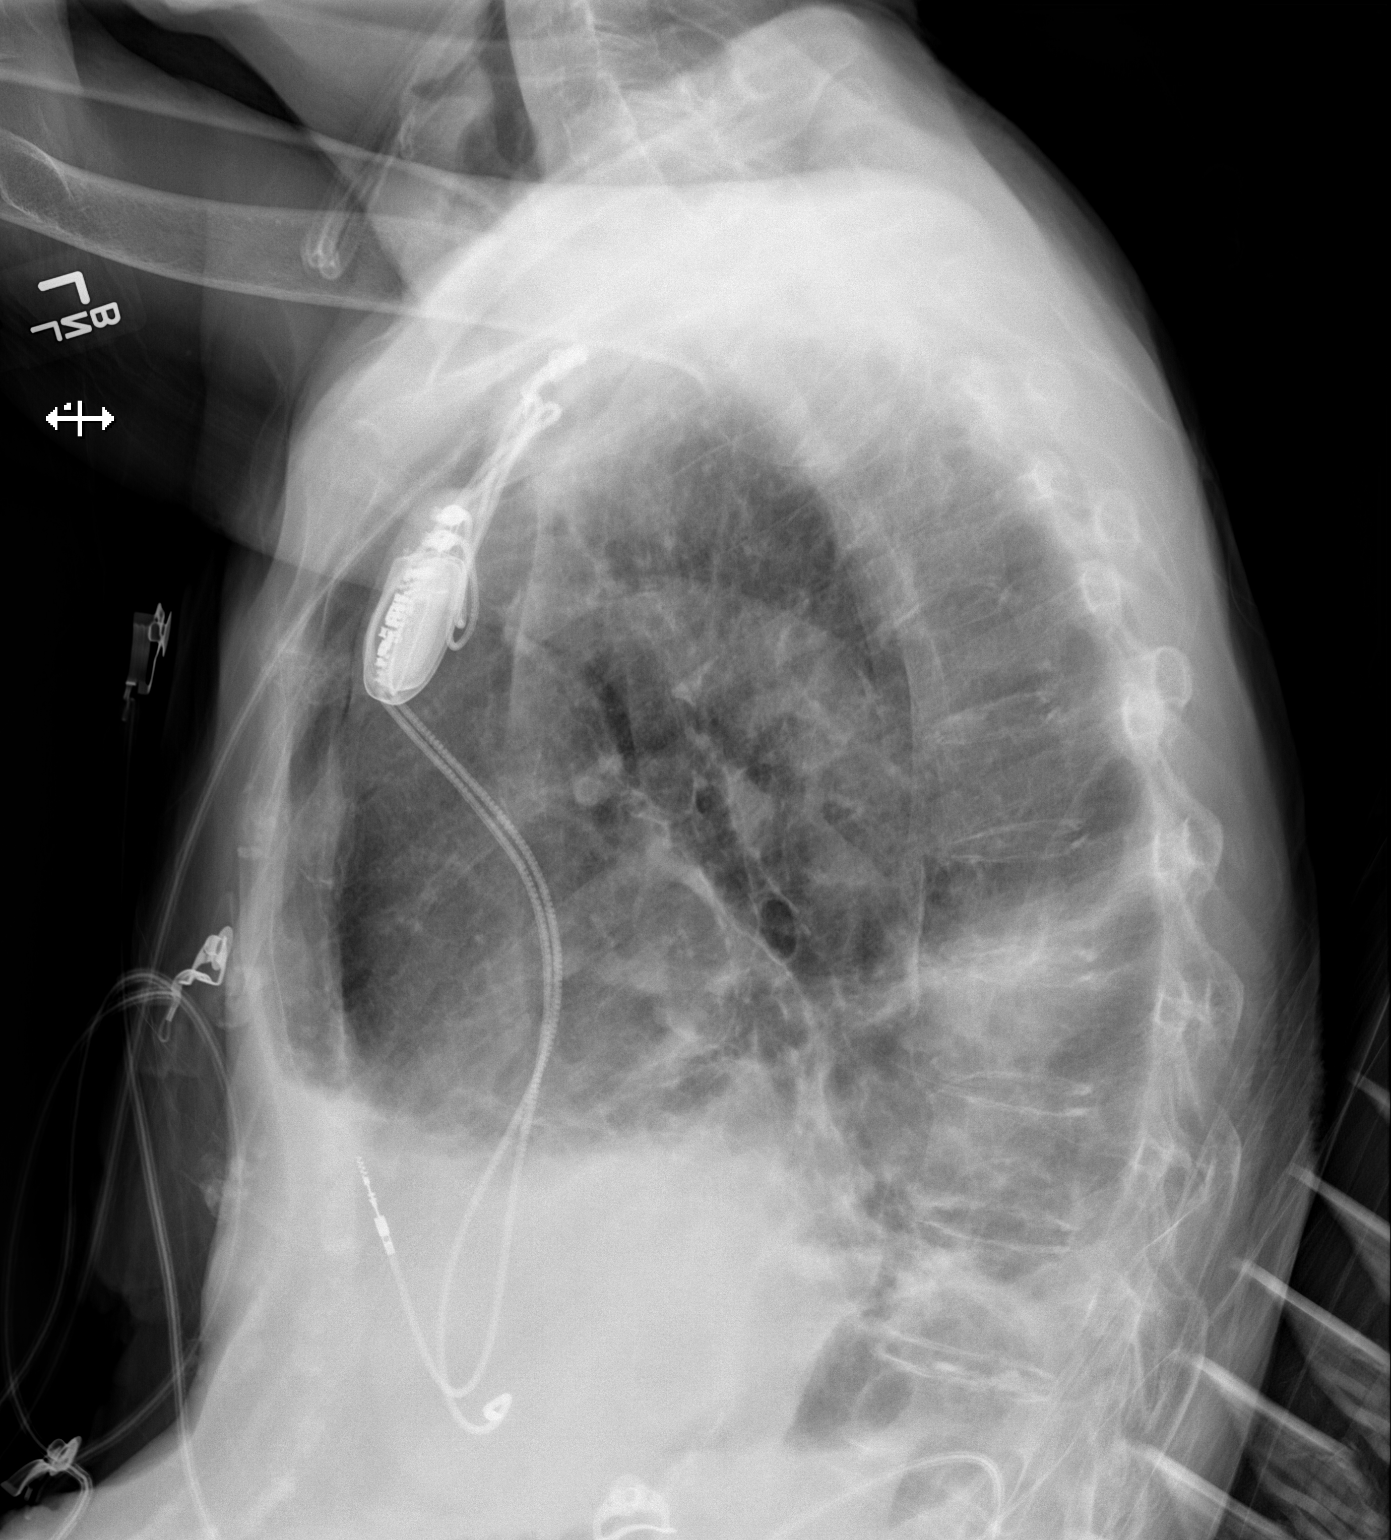

[2 of 2 positions shown; findings below may reference images not displayed]

FINDINGS: LEFT subclavian transvenous pacemaker leads project over RIGHT
atrium and RIGHT ventricle.

Enlargement of cardiac silhouette with pulmonary vascular
congestion.

Moderate RIGHT pleural effusion with atelectasis versus
consolidation in RIGHT middle and lower lobes.

Underlying COPD with additional atelectasis versus consolidation in
LEFT lower lobe.

Upper lungs clear.

No pneumothorax.

Bones demineralized.

Atherosclerotic calcification aorta.
IMPRESSION: COPD changes with moderate RIGHT pleural effusion and atelectasis
versus consolidation in RIGHT middle and RIGHT lower lobes.

Mild atelectasis at LEFT base.

Enlargement of cardiac silhouette with pulmonary vascular congestion
post pacemaker.

## 2016-07-16 ENCOUNTER — Ambulatory Visit: Payer: Medicare Other

## 2017-08-22 IMAGING — XA IR CHOLANGIOGRAM VIA EXIST CATHETER
1 series · 11 of 11 positions shown · non-contrast
Comparison: none

ADDENDUM:
It is recommended that a cholangiogram be performed in 1 month to
once again evaluate for possible catheter removal at that time.
CLINICAL DATA: Cholecystostomy tube check.

[Series 1: run · 5 acquisitions, 11 frames shown]
[im 1/5]
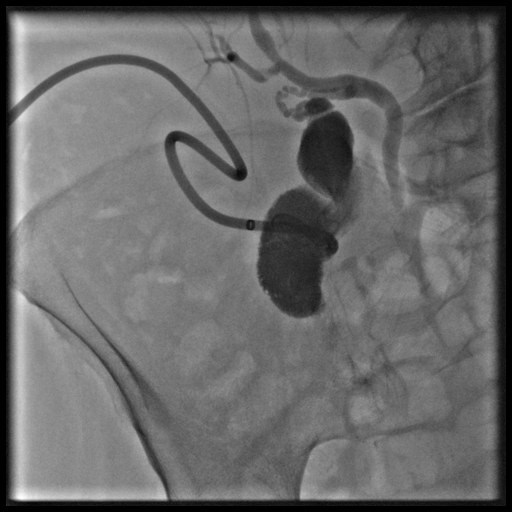
[im 1/5]
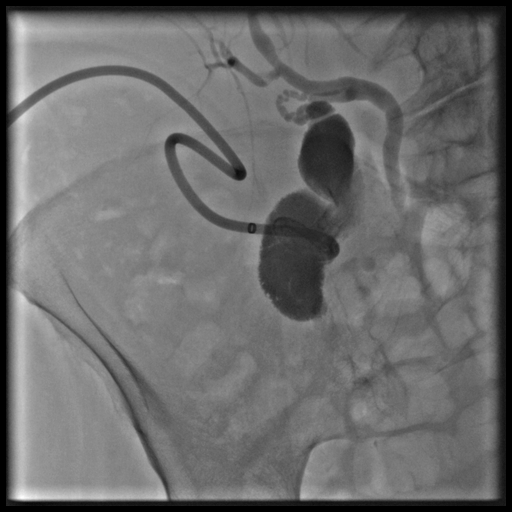
[im 1/5]
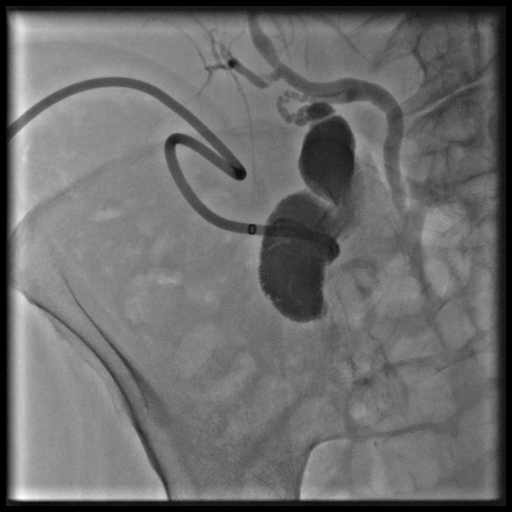
[im 1/5]
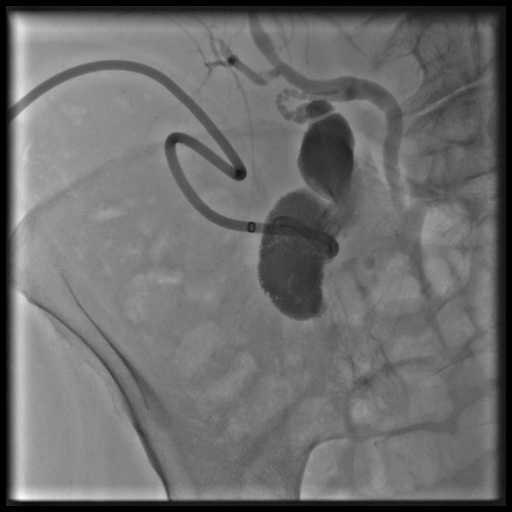
[im 2/5]
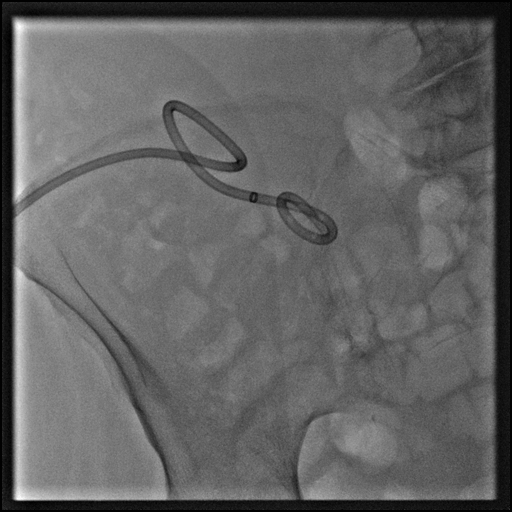
[im 3/5]
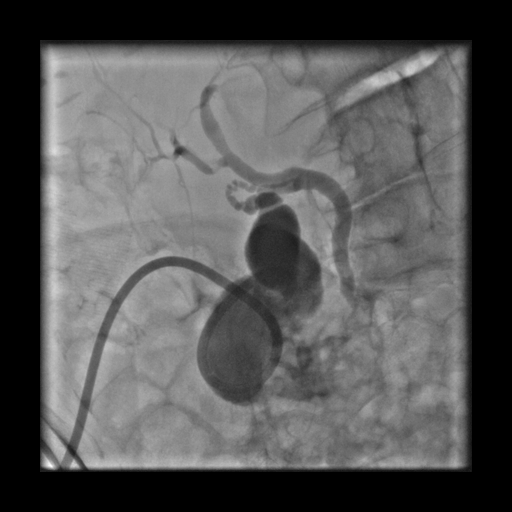
[im 3/5]
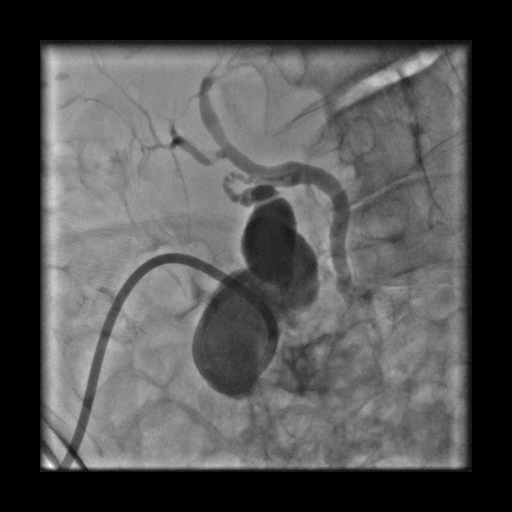
[im 3/5]
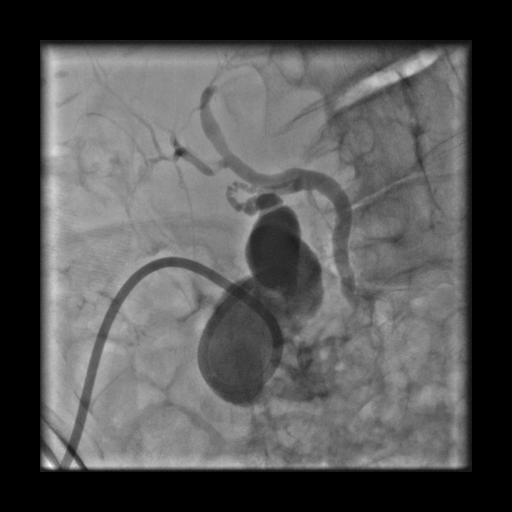
[im 3/5]
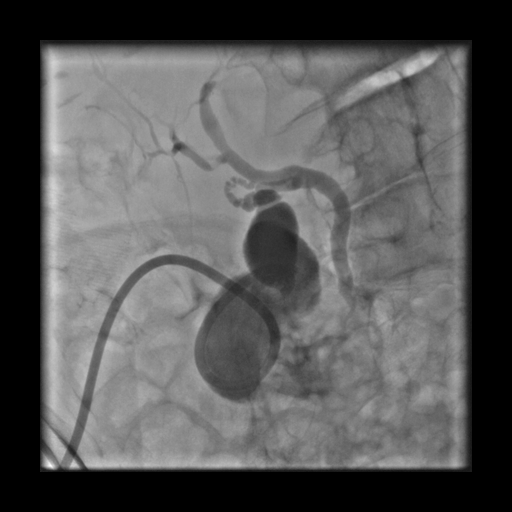
[im 4/5]
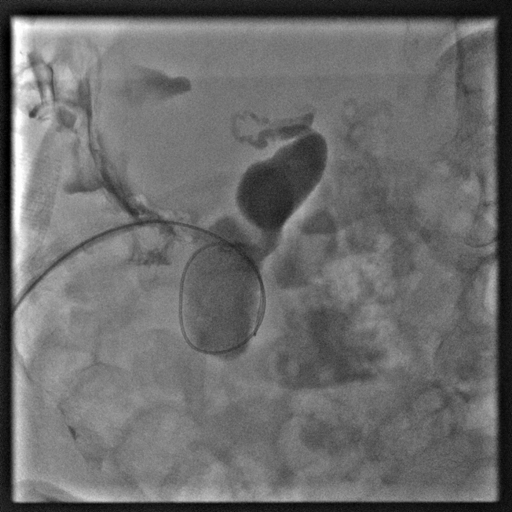
[im 5/5]
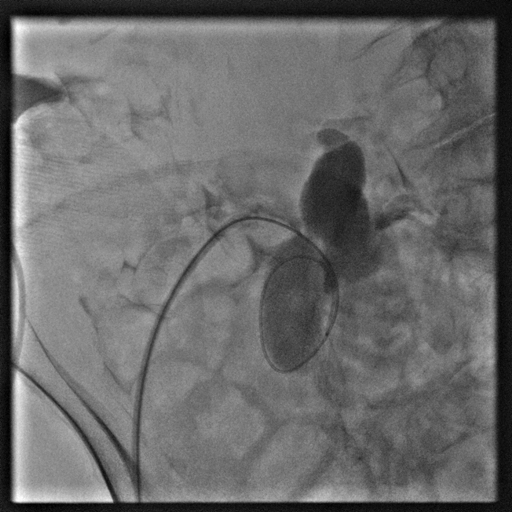

[11 of 11 positions shown; findings below may reference images not displayed]

EXAM:
CHOLANGIOGRAM VIA EXISTING CATHETER

ANESTHESIA/SEDATION:
None.

MEDICATIONS:
None.

CONTRAST:  10 mL.

PROCEDURE:
The patient was placed supine on angiographic table. The
percutaneous cholecystostomy catheter and right upper quadrant of
the abdomen were prepped and draped using sterile technique. Under
fluoroscopy, contrast was injected through the catheter,
demonstrating normal filling of the gallbladder lumen, cystic duct
and non dilated common bile duct. Antegrade flow into the duodenum
is noted. Then, under fluoroscopic guidance, guidewire is passed
through the catheter and looped within the gallbladder lumen. The
catheter was removed. Vascular sheath was passed over the wire and
inner dilator was removed. The sheath was slowly withdrawn as
contrast was injected through the sheath. This demonstrated spillage
of contrast into the peritoneal space underneath the liver and
between bowel loops. Therefore, a new 10.2 French drainage catheter
was passed over the wire, and its distal tip was looped within the
gallbladder lumen and internally secured. It was externally capped
to encourage internal flow. It was externally secured with suture
and appropriate dressing was applied.

COMPLICATIONS:
None immediate.
FINDINGS: Extravasation of contrast into peritoneal space as contrast was
injected into biliary cutaneous tract, requiring replacement of
cholecystostomy catheter.
IMPRESSION: As stated above, there was leakage of contrast in the peritoneal
space after injection of biliary cutaneous tract, and new
French drainage catheter was placed and secured within the
gallbladder lumen. It was externally capped for internal drainage.

## 2017-12-31 IMAGING — CR DG CHEST 2V
2 series · 2 of 2 positions shown · non-contrast
Comparison: 04/04/2015

CLINICAL DATA: Weakness for 1 day

EXAM:
CHEST  2 VIEW

[chest pa]
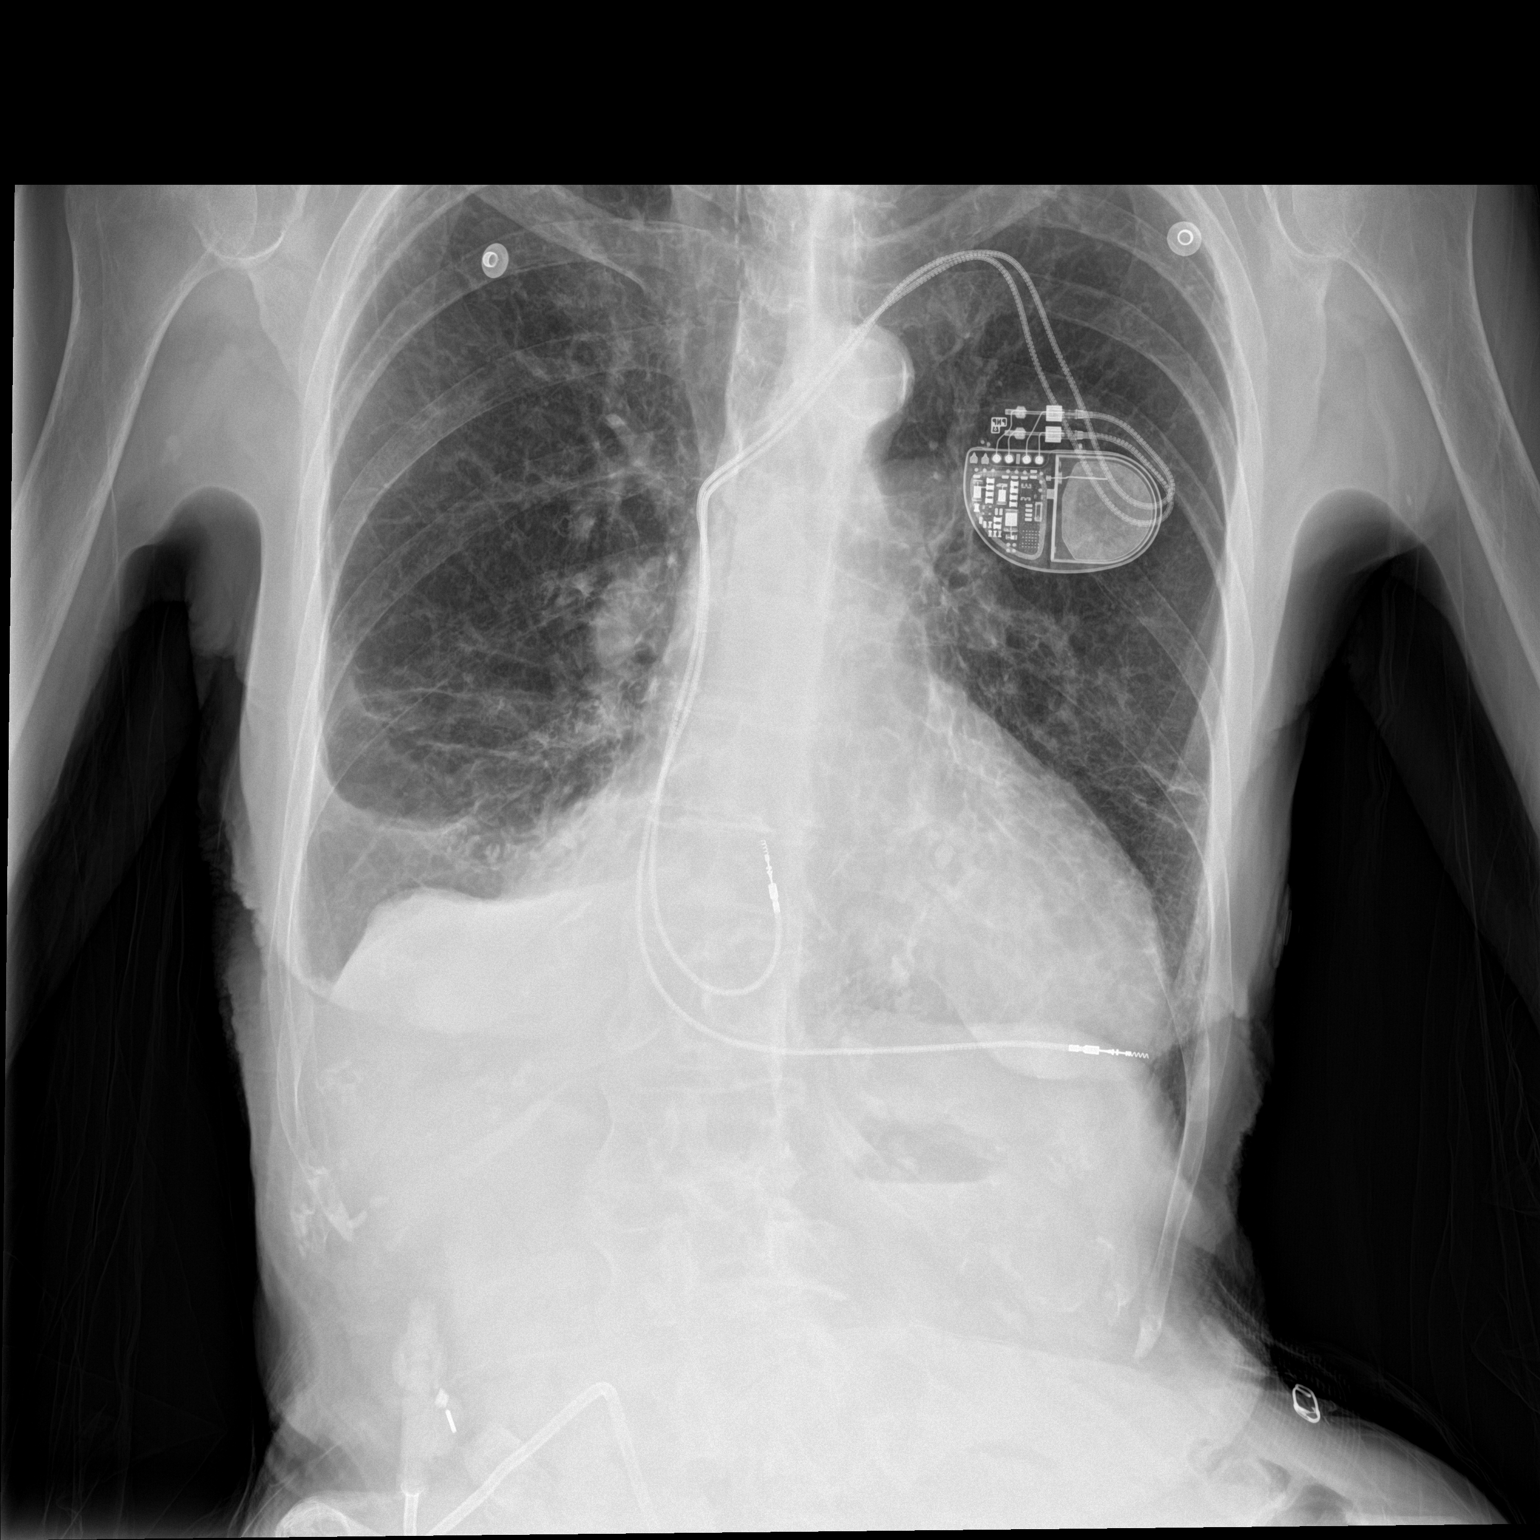

[chest lat]
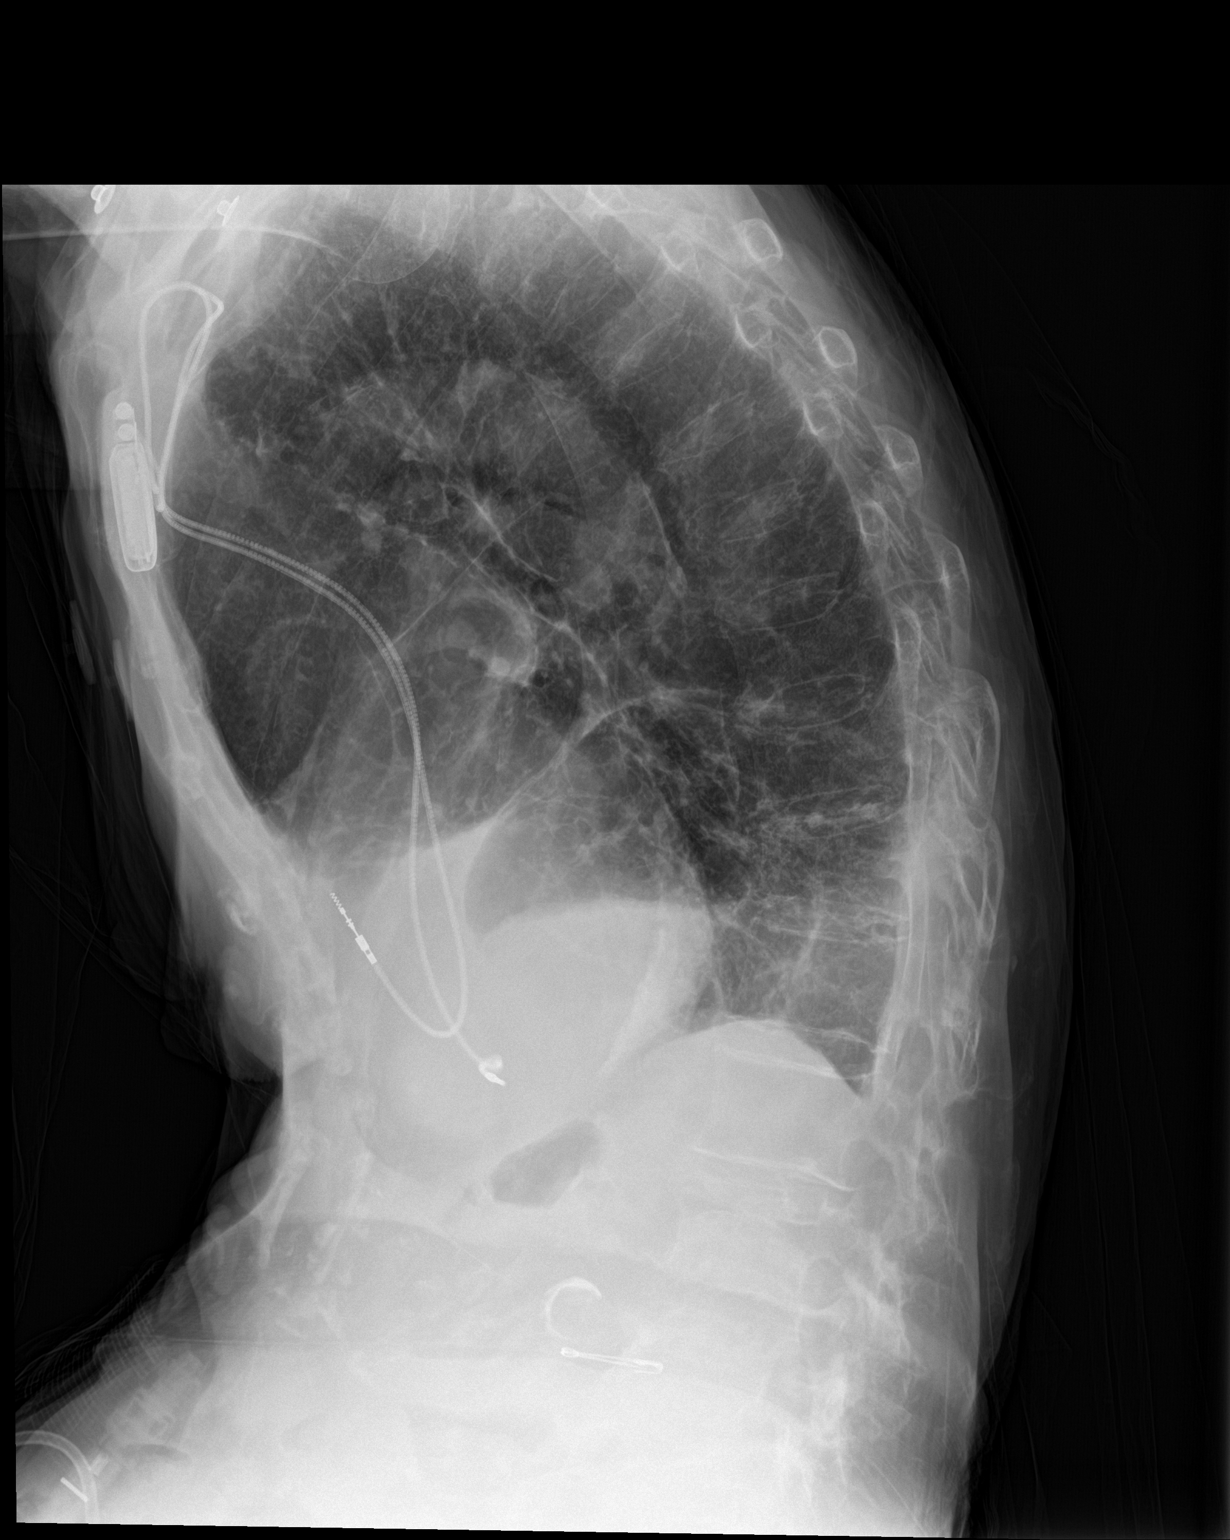

[2 of 2 positions shown; findings below may reference images not displayed]

FINDINGS: Cardiac shadow is enlarged but stable. A pacing device is again
seen. Stable fluid is noted in the right lung base. No new focal
infiltrate or sizable effusion is seen. No acute bony abnormality is
noted.
IMPRESSION: Stable appearance when compare with the prior exam.

## 2018-06-14 IMAGING — DX DG CHEST 1V
1 series · 1 of 1 positions shown · non-contrast
Comparison: 10/25/2015.

CLINICAL DATA: Shortness of breath.  Bronchitis.

EXAM:
CHEST 1 VIEW

[chest ap]
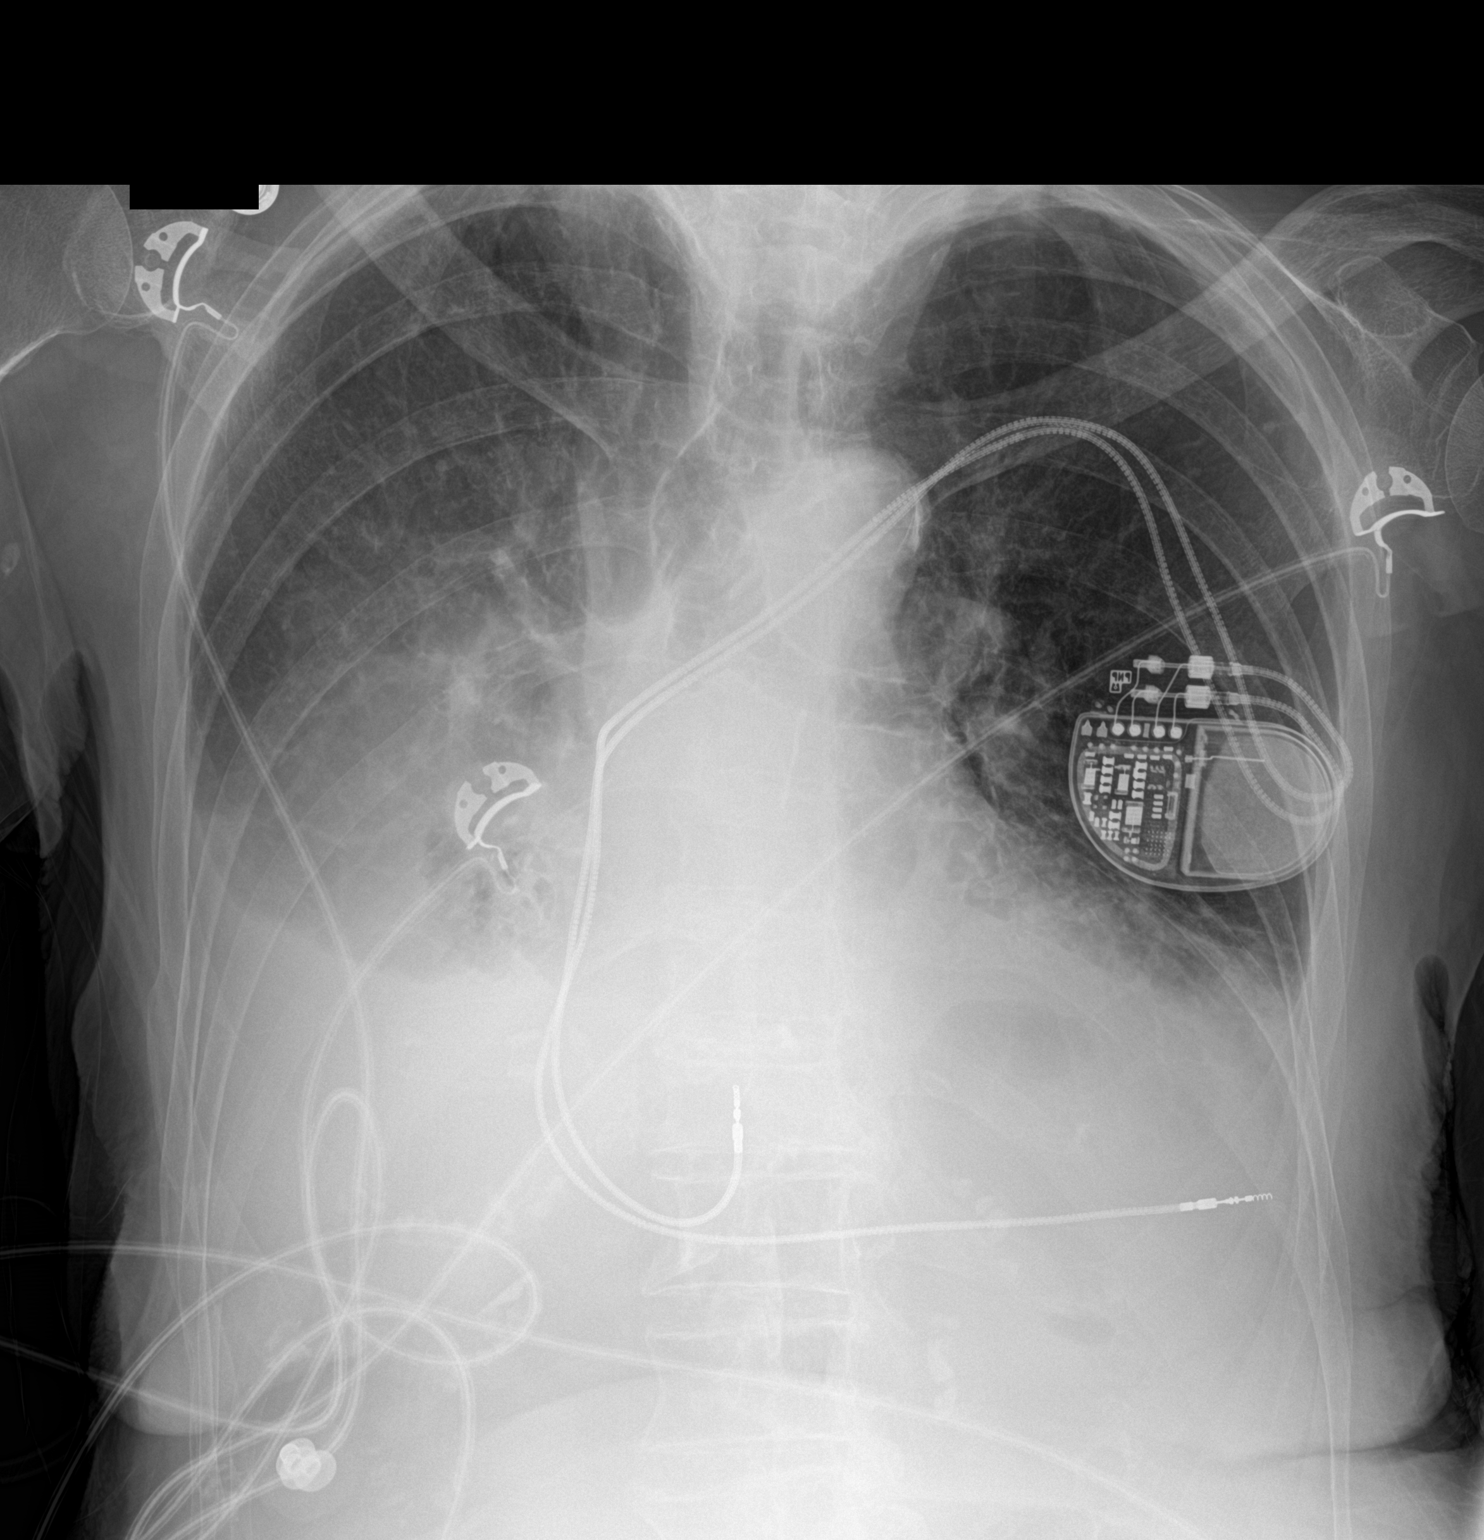

[1 of 1 positions shown; findings below may reference images not displayed]

FINDINGS: Cardiac pacer with lead tips in right atrium right ventricle.
Cardiomegaly with bilateral pulmonary infiltrates. Bilateral pleural
effusions. No pneumothorax. No acute bony abnormality .
IMPRESSION: 1. Cardiac pacer with lead tips in right atrium right ventricle.
Cardiomegaly with bilateral pulmonary infiltrates/edema right side
greater than left. Bilateral pleural effusions, right side greater
than left. Findings most consistent with congestive heart failure.
Similar findings noted on prior exam.

2.  Low lung volumes.
